# Patient Record
Sex: Female | Born: 1970
Health system: Southern US, Community
[De-identification: ages and names within clinical notes are randomized; demographics above are authoritative.]

## PROBLEM LIST (undated history)

## (undated) DIAGNOSIS — E278 Other specified disorders of adrenal gland: Secondary | ICD-10-CM

## (undated) DIAGNOSIS — F329 Major depressive disorder, single episode, unspecified: Secondary | ICD-10-CM

## (undated) DIAGNOSIS — R519 Headache, unspecified: Secondary | ICD-10-CM

## (undated) DIAGNOSIS — F32A Depression, unspecified: Secondary | ICD-10-CM

## (undated) DIAGNOSIS — Z923 Personal history of irradiation: Secondary | ICD-10-CM

## (undated) DIAGNOSIS — N946 Dysmenorrhea, unspecified: Secondary | ICD-10-CM

## (undated) DIAGNOSIS — C3491 Malignant neoplasm of unspecified part of right bronchus or lung: Secondary | ICD-10-CM

## (undated) DIAGNOSIS — M549 Dorsalgia, unspecified: Secondary | ICD-10-CM

## (undated) DIAGNOSIS — R51 Headache: Secondary | ICD-10-CM

## (undated) DIAGNOSIS — M79605 Pain in left leg: Secondary | ICD-10-CM

## (undated) DIAGNOSIS — I209 Angina pectoris, unspecified: Secondary | ICD-10-CM

## (undated) DIAGNOSIS — Z8489 Family history of other specified conditions: Secondary | ICD-10-CM

## (undated) DIAGNOSIS — Z7189 Other specified counseling: Secondary | ICD-10-CM

## (undated) DIAGNOSIS — C419 Malignant neoplasm of bone and articular cartilage, unspecified: Secondary | ICD-10-CM

## (undated) DIAGNOSIS — M545 Low back pain: Secondary | ICD-10-CM

## (undated) DIAGNOSIS — Z5112 Encounter for antineoplastic immunotherapy: Secondary | ICD-10-CM

## (undated) DIAGNOSIS — IMO0001 Reserved for inherently not codable concepts without codable children: Secondary | ICD-10-CM

## (undated) DIAGNOSIS — F419 Anxiety disorder, unspecified: Secondary | ICD-10-CM

## (undated) DIAGNOSIS — G8929 Other chronic pain: Secondary | ICD-10-CM

## (undated) DIAGNOSIS — R569 Unspecified convulsions: Secondary | ICD-10-CM

## (undated) HISTORY — DX: Low back pain: M54.5

## (undated) HISTORY — DX: Pain in left leg: M79.605

## (undated) HISTORY — DX: Other specified disorders of adrenal gland: E27.8

## (undated) HISTORY — DX: Encounter for antineoplastic immunotherapy: Z51.12

## (undated) HISTORY — DX: Unspecified convulsions: R56.9

## (undated) HISTORY — DX: Malignant neoplasm of unspecified part of right bronchus or lung: C34.91

## (undated) HISTORY — PX: OTHER SURGICAL HISTORY: SHX169

## (undated) HISTORY — DX: Dysmenorrhea, unspecified: N94.6

## (undated) HISTORY — DX: Other specified counseling: Z71.89

---

## 1997-09-02 ENCOUNTER — Other Ambulatory Visit: Admission: RE | Admit: 1997-09-02 | Discharge: 1997-09-02 | Payer: Self-pay | Admitting: Obstetrics

## 1997-11-20 ENCOUNTER — Encounter: Admission: RE | Admit: 1997-11-20 | Discharge: 1997-11-20 | Payer: Self-pay | Admitting: *Deleted

## 1997-11-25 ENCOUNTER — Encounter: Admission: RE | Admit: 1997-11-25 | Discharge: 1997-11-25 | Payer: Self-pay | Admitting: Internal Medicine

## 1998-02-05 ENCOUNTER — Emergency Department (HOSPITAL_COMMUNITY): Admission: EM | Admit: 1998-02-05 | Discharge: 1998-02-05 | Payer: Self-pay | Admitting: Emergency Medicine

## 1998-02-05 ENCOUNTER — Encounter: Payer: Self-pay | Admitting: Emergency Medicine

## 1998-02-10 ENCOUNTER — Emergency Department (HOSPITAL_COMMUNITY): Admission: EM | Admit: 1998-02-10 | Discharge: 1998-02-10 | Payer: Self-pay | Admitting: Emergency Medicine

## 1998-03-12 ENCOUNTER — Encounter: Admission: RE | Admit: 1998-03-12 | Discharge: 1998-03-12 | Payer: Self-pay | Admitting: *Deleted

## 1998-03-12 ENCOUNTER — Encounter: Admission: RE | Admit: 1998-03-12 | Discharge: 1998-06-10 | Payer: Self-pay | Admitting: *Deleted

## 1998-03-22 ENCOUNTER — Encounter: Admission: RE | Admit: 1998-03-22 | Discharge: 1998-03-22 | Payer: Self-pay | Admitting: *Deleted

## 1998-03-22 ENCOUNTER — Encounter: Admission: RE | Admit: 1998-03-22 | Discharge: 1998-06-20 | Payer: Self-pay | Admitting: *Deleted

## 1998-04-05 ENCOUNTER — Encounter: Payer: Self-pay | Admitting: *Deleted

## 1998-05-17 ENCOUNTER — Other Ambulatory Visit: Admission: RE | Admit: 1998-05-17 | Discharge: 1998-05-17 | Payer: Self-pay | Admitting: Obstetrics and Gynecology

## 1998-07-22 ENCOUNTER — Emergency Department (HOSPITAL_COMMUNITY): Admission: EM | Admit: 1998-07-22 | Discharge: 1998-07-22 | Payer: Self-pay | Admitting: Emergency Medicine

## 1998-08-25 ENCOUNTER — Encounter: Admission: RE | Admit: 1998-08-25 | Discharge: 1998-11-01 | Payer: Self-pay | Admitting: *Deleted

## 1999-05-04 ENCOUNTER — Emergency Department (HOSPITAL_COMMUNITY): Admission: EM | Admit: 1999-05-04 | Discharge: 1999-05-04 | Payer: Self-pay | Admitting: Internal Medicine

## 2000-06-26 ENCOUNTER — Emergency Department (HOSPITAL_COMMUNITY): Admission: EM | Admit: 2000-06-26 | Discharge: 2000-06-26 | Payer: Self-pay | Admitting: Emergency Medicine

## 2001-04-22 ENCOUNTER — Emergency Department (HOSPITAL_COMMUNITY): Admission: EM | Admit: 2001-04-22 | Discharge: 2001-04-22 | Payer: Self-pay | Admitting: Emergency Medicine

## 2001-07-26 ENCOUNTER — Other Ambulatory Visit: Admission: RE | Admit: 2001-07-26 | Discharge: 2001-07-26 | Payer: Self-pay | Admitting: Gynecology

## 2001-11-15 ENCOUNTER — Encounter: Payer: Self-pay | Admitting: Emergency Medicine

## 2001-11-15 ENCOUNTER — Emergency Department (HOSPITAL_COMMUNITY): Admission: EM | Admit: 2001-11-15 | Discharge: 2001-11-15 | Payer: Self-pay | Admitting: Emergency Medicine

## 2001-11-18 ENCOUNTER — Emergency Department (HOSPITAL_COMMUNITY): Admission: EM | Admit: 2001-11-18 | Discharge: 2001-11-18 | Payer: Self-pay | Admitting: Emergency Medicine

## 2003-09-07 ENCOUNTER — Encounter: Admission: RE | Admit: 2003-09-07 | Discharge: 2003-09-07 | Payer: Self-pay | Admitting: Internal Medicine

## 2004-01-06 ENCOUNTER — Ambulatory Visit: Payer: Self-pay | Admitting: Internal Medicine

## 2004-01-19 ENCOUNTER — Ambulatory Visit (HOSPITAL_COMMUNITY): Admission: RE | Admit: 2004-01-19 | Discharge: 2004-01-19 | Payer: Self-pay | Admitting: Internal Medicine

## 2004-02-05 ENCOUNTER — Ambulatory Visit (HOSPITAL_COMMUNITY): Admission: RE | Admit: 2004-02-05 | Discharge: 2004-02-05 | Payer: Self-pay | Admitting: Internal Medicine

## 2004-03-03 ENCOUNTER — Ambulatory Visit: Payer: Self-pay | Admitting: Obstetrics and Gynecology

## 2004-03-27 ENCOUNTER — Emergency Department (HOSPITAL_COMMUNITY): Admission: EM | Admit: 2004-03-27 | Discharge: 2004-03-27 | Payer: Self-pay | Admitting: Emergency Medicine

## 2004-06-21 ENCOUNTER — Ambulatory Visit: Payer: Self-pay | Admitting: Obstetrics and Gynecology

## 2004-07-11 ENCOUNTER — Ambulatory Visit: Payer: Self-pay | Admitting: Obstetrics and Gynecology

## 2004-07-11 ENCOUNTER — Ambulatory Visit (HOSPITAL_COMMUNITY): Admission: RE | Admit: 2004-07-11 | Discharge: 2004-07-11 | Payer: Self-pay | Admitting: Obstetrics and Gynecology

## 2004-07-28 ENCOUNTER — Ambulatory Visit: Payer: Self-pay | Admitting: Obstetrics and Gynecology

## 2004-10-13 ENCOUNTER — Ambulatory Visit: Payer: Self-pay | Admitting: *Deleted

## 2004-12-29 ENCOUNTER — Ambulatory Visit: Payer: Self-pay | Admitting: Obstetrics & Gynecology

## 2005-02-23 ENCOUNTER — Ambulatory Visit: Payer: Self-pay | Admitting: *Deleted

## 2005-02-23 ENCOUNTER — Encounter (INDEPENDENT_AMBULATORY_CARE_PROVIDER_SITE_OTHER): Payer: Self-pay | Admitting: *Deleted

## 2005-03-09 ENCOUNTER — Ambulatory Visit: Payer: Self-pay | Admitting: Internal Medicine

## 2005-03-23 ENCOUNTER — Ambulatory Visit: Payer: Self-pay | Admitting: *Deleted

## 2005-04-21 ENCOUNTER — Ambulatory Visit: Payer: Self-pay | Admitting: Family Medicine

## 2005-04-21 ENCOUNTER — Other Ambulatory Visit: Admission: RE | Admit: 2005-04-21 | Discharge: 2005-04-21 | Payer: Self-pay | Admitting: Family Medicine

## 2005-05-02 ENCOUNTER — Ambulatory Visit: Payer: Self-pay | Admitting: Obstetrics & Gynecology

## 2005-06-01 ENCOUNTER — Ambulatory Visit: Payer: Self-pay | Admitting: Family Medicine

## 2005-06-01 ENCOUNTER — Encounter (INDEPENDENT_AMBULATORY_CARE_PROVIDER_SITE_OTHER): Payer: Self-pay | Admitting: *Deleted

## 2005-06-01 ENCOUNTER — Other Ambulatory Visit: Admission: RE | Admit: 2005-06-01 | Discharge: 2005-06-01 | Payer: Self-pay | Admitting: Family Medicine

## 2005-06-15 ENCOUNTER — Ambulatory Visit: Payer: Self-pay | Admitting: Family Medicine

## 2005-08-21 ENCOUNTER — Ambulatory Visit: Payer: Self-pay | Admitting: *Deleted

## 2005-10-06 ENCOUNTER — Ambulatory Visit: Payer: Self-pay | Admitting: Family Medicine

## 2005-10-06 ENCOUNTER — Encounter (INDEPENDENT_AMBULATORY_CARE_PROVIDER_SITE_OTHER): Payer: Self-pay | Admitting: *Deleted

## 2005-10-09 ENCOUNTER — Inpatient Hospital Stay (HOSPITAL_COMMUNITY): Admission: AD | Admit: 2005-10-09 | Discharge: 2005-10-09 | Payer: Self-pay | Admitting: Gynecology

## 2005-11-06 ENCOUNTER — Ambulatory Visit: Payer: Self-pay | Admitting: Family Medicine

## 2006-03-08 ENCOUNTER — Ambulatory Visit: Payer: Self-pay | Admitting: Gynecology

## 2006-03-08 ENCOUNTER — Encounter (INDEPENDENT_AMBULATORY_CARE_PROVIDER_SITE_OTHER): Payer: Self-pay | Admitting: *Deleted

## 2006-04-01 ENCOUNTER — Emergency Department (HOSPITAL_COMMUNITY): Admission: EM | Admit: 2006-04-01 | Discharge: 2006-04-01 | Payer: Self-pay | Admitting: Emergency Medicine

## 2006-07-11 ENCOUNTER — Ambulatory Visit: Payer: Self-pay | Admitting: Family Medicine

## 2006-09-26 ENCOUNTER — Ambulatory Visit: Payer: Self-pay | Admitting: Obstetrics and Gynecology

## 2006-12-12 ENCOUNTER — Ambulatory Visit: Payer: Self-pay | Admitting: Obstetrics and Gynecology

## 2007-01-24 ENCOUNTER — Emergency Department (HOSPITAL_COMMUNITY): Admission: EM | Admit: 2007-01-24 | Discharge: 2007-01-24 | Payer: Self-pay | Admitting: Emergency Medicine

## 2007-01-30 ENCOUNTER — Emergency Department (HOSPITAL_COMMUNITY): Admission: EM | Admit: 2007-01-30 | Discharge: 2007-01-30 | Payer: Self-pay | Admitting: Emergency Medicine

## 2007-03-13 ENCOUNTER — Encounter: Payer: Self-pay | Admitting: Obstetrics and Gynecology

## 2007-03-13 ENCOUNTER — Ambulatory Visit: Payer: Self-pay | Admitting: Obstetrics & Gynecology

## 2007-03-15 ENCOUNTER — Encounter: Admission: RE | Admit: 2007-03-15 | Discharge: 2007-03-15 | Payer: Self-pay | Admitting: Obstetrics & Gynecology

## 2007-05-30 ENCOUNTER — Ambulatory Visit: Payer: Self-pay | Admitting: Obstetrics and Gynecology

## 2007-08-15 ENCOUNTER — Ambulatory Visit: Payer: Self-pay | Admitting: Obstetrics and Gynecology

## 2007-11-13 ENCOUNTER — Ambulatory Visit: Payer: Self-pay | Admitting: Obstetrics and Gynecology

## 2008-02-19 ENCOUNTER — Ambulatory Visit: Payer: Self-pay | Admitting: Family Medicine

## 2008-05-06 ENCOUNTER — Encounter: Payer: Self-pay | Admitting: Obstetrics & Gynecology

## 2008-05-06 ENCOUNTER — Ambulatory Visit: Payer: Self-pay | Admitting: Obstetrics & Gynecology

## 2008-06-04 ENCOUNTER — Emergency Department (HOSPITAL_COMMUNITY): Admission: EM | Admit: 2008-06-04 | Discharge: 2008-06-04 | Payer: Self-pay | Admitting: Emergency Medicine

## 2008-07-22 ENCOUNTER — Ambulatory Visit: Payer: Self-pay | Admitting: Obstetrics & Gynecology

## 2008-10-08 ENCOUNTER — Ambulatory Visit: Payer: Self-pay | Admitting: Obstetrics & Gynecology

## 2008-10-22 ENCOUNTER — Ambulatory Visit: Payer: Self-pay | Admitting: Obstetrics and Gynecology

## 2009-01-15 ENCOUNTER — Ambulatory Visit: Payer: Self-pay | Admitting: Obstetrics & Gynecology

## 2009-04-07 ENCOUNTER — Ambulatory Visit: Payer: Self-pay | Admitting: Obstetrics & Gynecology

## 2009-04-21 ENCOUNTER — Emergency Department (HOSPITAL_COMMUNITY): Admission: EM | Admit: 2009-04-21 | Discharge: 2009-04-21 | Payer: Self-pay | Admitting: Emergency Medicine

## 2009-06-24 ENCOUNTER — Ambulatory Visit: Payer: Self-pay | Admitting: Obstetrics and Gynecology

## 2009-09-15 ENCOUNTER — Encounter: Payer: Self-pay | Admitting: Internal Medicine

## 2009-09-15 ENCOUNTER — Ambulatory Visit: Payer: Self-pay | Admitting: Obstetrics and Gynecology

## 2009-09-16 ENCOUNTER — Encounter: Payer: Self-pay | Admitting: Obstetrics and Gynecology

## 2009-09-16 LAB — CONVERTED CEMR LAB: Yeast Wet Prep HPF POC: NONE SEEN

## 2009-11-11 ENCOUNTER — Emergency Department (HOSPITAL_COMMUNITY): Admission: EM | Admit: 2009-11-11 | Discharge: 2009-11-11 | Payer: Self-pay | Admitting: Family Medicine

## 2009-12-02 ENCOUNTER — Encounter: Payer: Self-pay | Admitting: Internal Medicine

## 2009-12-02 ENCOUNTER — Ambulatory Visit: Payer: Self-pay | Admitting: Internal Medicine

## 2009-12-02 DIAGNOSIS — IMO0002 Reserved for concepts with insufficient information to code with codable children: Secondary | ICD-10-CM

## 2009-12-02 LAB — CONVERTED CEMR LAB: Beta hcg, urine, semiquantitative: NEGATIVE

## 2009-12-06 ENCOUNTER — Ambulatory Visit: Payer: Self-pay | Admitting: Internal Medicine

## 2009-12-06 DIAGNOSIS — N946 Dysmenorrhea, unspecified: Secondary | ICD-10-CM | POA: Insufficient documentation

## 2009-12-06 HISTORY — DX: Dysmenorrhea, unspecified: N94.6

## 2010-02-28 ENCOUNTER — Ambulatory Visit: Payer: Self-pay | Admitting: Internal Medicine

## 2010-03-01 ENCOUNTER — Ambulatory Visit: Payer: Self-pay | Admitting: Internal Medicine

## 2010-03-01 ENCOUNTER — Encounter: Payer: Self-pay | Admitting: Internal Medicine

## 2010-03-01 DIAGNOSIS — Z87891 Personal history of nicotine dependence: Secondary | ICD-10-CM

## 2010-03-01 DIAGNOSIS — R3919 Other difficulties with micturition: Secondary | ICD-10-CM | POA: Insufficient documentation

## 2010-03-01 LAB — CONVERTED CEMR LAB
GC Probe Amp, Urine: NEGATIVE
Glucose, Urine, Semiquant: NEGATIVE
Nitrite: NEGATIVE
Specific Gravity, Urine: 1.025

## 2010-03-03 ENCOUNTER — Telehealth: Payer: Self-pay | Admitting: Licensed Clinical Social Worker

## 2010-03-10 ENCOUNTER — Encounter: Payer: Self-pay | Admitting: Internal Medicine

## 2010-03-10 LAB — CONVERTED CEMR LAB
Bacteria, UA: NONE SEEN
Bilirubin Urine: NEGATIVE
CO2: 24 meq/L (ref 19–32)
Chloride: 110 meq/L (ref 96–112)
Creatinine, Ser: 1 mg/dL (ref 0.40–1.20)
Eosinophils Absolute: 0.2 10*3/uL (ref 0.0–0.7)
Eosinophils Relative: 2 % (ref 0–5)
Glucose, Bld: 90 mg/dL (ref 70–99)
Hemoglobin: 14.7 g/dL (ref 12.0–15.0)
Ketones, ur: NEGATIVE mg/dL
Leukocytes, UA: NEGATIVE
Lymphs Abs: 3.6 10*3/uL (ref 0.7–4.0)
MCV: 99.3 fL (ref >=78.0)
Monocytes Absolute: 0.5 10*3/uL (ref 0.1–1.0)
Monocytes Relative: 7 % (ref 3–12)
Neutro Abs: 2.4 10*3/uL (ref 1.7–7.7)
Neutrophils Relative %: 36 % — ABNORMAL LOW (ref 43–77)
Nitrite: NEGATIVE
Protein, ur: NEGATIVE mg/dL
Sodium: 142 meq/L (ref 135–145)
Total Protein: 7.3 g/dL (ref 6.0–8.3)
Urobilinogen, UA: 0.2 (ref 0.0–1.0)

## 2010-03-25 ENCOUNTER — Telehealth: Payer: Self-pay | Admitting: Licensed Clinical Social Worker

## 2010-03-28 ENCOUNTER — Ambulatory Visit: Payer: Self-pay | Admitting: Internal Medicine

## 2010-03-28 DIAGNOSIS — B373 Candidiasis of vulva and vagina: Secondary | ICD-10-CM

## 2010-03-30 ENCOUNTER — Telehealth: Payer: Self-pay | Admitting: Licensed Clinical Social Worker

## 2010-04-01 ENCOUNTER — Encounter: Payer: Self-pay | Admitting: Licensed Clinical Social Worker

## 2010-05-14 ENCOUNTER — Encounter: Payer: Self-pay | Admitting: Internal Medicine

## 2010-05-23 ENCOUNTER — Ambulatory Visit: Admit: 2010-05-23 | Payer: Self-pay

## 2010-05-24 NOTE — Consult Note (Signed)
Summary: GYN VISIT NOTE  GYN VISIT NOTE   Imported By: Margie Billet 12/22/2009 11:41:40  _____________________________________________________________________  External Attachment:    Type:   Image     Comment:   External Document

## 2010-05-24 NOTE — Assessment & Plan Note (Signed)
Summary: FU VISIT/?URINE INJECTION/DS   Vital Signs:  Patient profile:   40 year old female Height:      67 inches (170.18 cm) Weight:      150.6 pounds (68.45 kg) BMI:     23.67 Temp:     98.6 degrees F (37.00 degrees C) oral Pulse rate:   85 / minute BP sitting:   128 / 81  (left arm) Cuff size:   regular  Vitals Entered By: Cynda Familia Duncan Dull) (March 01, 2010 2:47 PM) CC: wants "kidneys checked" c/o urine odor Is Patient Diabetic? No Nutritional Status BMI of 19 -24 = normal  Have you ever been in a relationship where you felt threatened, hurt or afraid?No   Does patient need assistance? Functional Status Self care Ambulation Normal   Primary Care Provider:  Rosana Berger MD  CC:  wants "kidneys checked" c/o urine odor.  History of Present Illness: 40 yo woman presents for a kidney check up.  She does not like to drink water, she only drinks tea.  She drank a liter of sprite, smelled like "medicine" orange in color, 3-4 mcdonald tea drink per day in the past 4 months.  No burning sensation, no dysuria, frequency 3-6 x per day.  She reports that she would get back pain whenever she drinks lots of soda and this has happened before in the past.  She has never seen a doctor for this problem before.  She is taking Daytime sinus for her sinus infection. She expresses interest in quitting smoking cigarette and would like some help.   Preventive Screening-Counseling & Management  Alcohol-Tobacco     Smoking Status: current     Packs/Day: 1 ppd  Review of Systems       The patient complains of weight gain.         gaining over 20 lbs because she wants to by eating a lot  Physical Exam  General:  alert, well-developed, well-nourished, and well-hydrated.   Lungs:  normal respiratory effort, no intercostal retractions, no accessory muscle use, and normal breath sounds.   Heart:  normal rate, regular rhythm, no murmur, and no gallop.   Abdomen:  soft, non-tender,  normal bowel sounds, and no distention.  Neg CVA tenderness Neurologic:  alert & oriented X3, cranial nerves II-XII intact, and strength normal in all extremities.     Impression & Recommendations:  Problem # 1:  DARK URINE (ICD-788.69) Assessment New Questionable hematuria, patient had small blood in office urinalysis, with some ketone and small bilirubin. We will check to see if she has RBC cast in her U/A or myoglobin.  Will defer treatment for now until all labs/urinalysis are back.  Patient also reports being anemic in the past, we will check her CBC as well.   Orders: T-Urinalysis ([id number]) T-GC Probe, urine [phone]) T-Culture, Urine ([id number]) T-Comprehensive Metabolic Panel ([id number]) T-CBC w/Diff ([id number])  Problem # 2:  TOBACCO USE, QUIT (ICD-V15.82) Assessment: New Patient has been smoking in the past 21 years and would like to quit smoking.  She has tried patch and gum before without success and stated that the patch was too expensive. We set a QUIT date in the office: 03/15/2010.  I explained to her in details about the side effects of Wellbutrin and she undertands that she needs to stop taking the med and follow up with me if she feels depressed or have thoughts about hurting herself.   Prescribed Wellbutrin 150mg  by mouth once daily x 3  days, then 150mg  by mouth two times a day . Patient is supposed to follow up with me in 2-3 weeks  Social work refer for smoking cessation counseling Orders: Social Work Referral (Social )  Problem # 3:  PREVENTIVE HEALTH CARE (ICD-V70.0) Assessment: New Will do pap smear in May 2012, she has had abnormal pap in the past and had leep incision.  Ever since her leep, her pap smear has been normal. Mammogram was about 1 year ago with normal report per patient, she has a positive family history of breast and cervical center.  Complete Medication List: 1)  Wellbutrin Sr 150 Mg Xr12h-tab (Bupropion hcl) .[name].. 1 tablet by mouth  once daily x 3 days, then take 1 tab by mouth two times a day  Patient Instructions: 1)  Start taking Wellbutrin (Bupropion) 150mg  once daily x 3 days, then 150mg  two times a day  2)  Please set your quit date within 2 weeks of starting Wellbutrin.  On the QUIT date, you need to throw away your ash-tray, cigarettes, or anything that associated with smoking. 3)  If you start feeling depressed or thoughts about hurting yourself, please stop your medication and see me in clinic. 4)  Please follow-up in 2-3 weeks with Dr. Anselm Jungling 5)  I will call you with lab results 6)  Our social worker Dorothe Pea will call you with smoking cessation counselling/behaviors Prescriptions: WELLBUTRIN SR 150 MG XR12H-TAB (BUPROPION HCL) 1 tablet by mouth once daily x 3 days, then take 1 tab by mouth two times a day  #60 x 1   Entered and Authorized by:   Rosana Berger MD   Signed by:   Rosana Berger MD on 03/01/2010   Method used:   Historical   RxID:   [id number]    Orders Added: 1)  T-Urinalysis [81003-65000] 2)  T-GC Probe, urine [phone] 3)  T-Culture, Urine [[id number]] 4)  T-Comprehensive Metabolic Panel [[id number]] 5)  T-CBC w/Diff [[id number]] 6)  Est. Patient Level III [44010] 7)  Social Work Referral [facility] ]   Process Orders Check Orders Results:     [facility]: ABN not required for this insurance Tests Sent for requisitioning (March 01, 2010 4:36 PM):     03/01/2010: [facility] -- T-Urinalysis [81003-65000] (signed)     03/01/2010: [facility] -- T-GC Probe, urine [id number] (signed)     03/01/2010: [facility] -- T-Culture, Urine [[id number]] (signed)     03/01/2010: [facility] -- T-Comprehensive Metabolic Panel [[id number]] (signed)     03/01/2010: [facility] -- [facility] w/Diff [[id number]] (signed)    Laboratory Results   Urine Tests  Date/Time Received: .Cynda Familia [facility])  March 01, 2010 2:56 PM  Date/Time Reported: .Cynda Familia [facility]  March 01, 2010 2:57 PM   Routine Urinalysis   Color: yellow Appearance: Clear Glucose: negative   (Normal Range: Negative) Bilirubin: small   (Normal Range: Negative) Ketone: trace (5)   (Normal Range: Negative) Spec. Gravity: 1.025   (Normal Range: 1.003-1.035) Blood: small   (Normal Range: Negative) pH: 5.0   (Normal Range: 5.0-8.0) Protein: negative   (Normal Range: Negative) Urobilinogen: 0.2   (Normal Range: 0-1) Nitrite: negative   (Normal Range: Negative) Leukocyte Esterace: negative   (Normal Range: Negative)        Prevention & Chronic Care Immunizations   Influenza vaccine: Not documented    Tetanus booster: Not documented    Pneumococcal vaccine: Not documented  Other  Screening   Pap smear: NEGATIVE FOR INTRAEPITHELIAL LESIONS OR MALIGNANCY.  (05/06/2008)   Smoking status: current  (03/01/2010)  Lipids   Total Cholesterol: Not documented   LDL: Not documented   LDL Direct: Not documented   HDL: Not documented   Triglycerides: Not documented  Appended Document: Orders Update    Clinical Lists Changes  Orders: Added new Test order of T-Chlamydia  Probe, urine [id number]) - Signed Added new Test order of T-Urinalysis Dipstick only (09811BJ) - Signed      Process Orders Check Orders Results:     [facility]: ABN not required for this insurance Order queued for requisitioning for [facility]: March 01, 2010 4:41 PM Tests Sent for requisitioning (March 02, 2010 10:10 AM):     03/01/2010: [facility] -- [employer], urine [phone] (signed)

## 2010-05-24 NOTE — Miscellaneous (Signed)
Summary: PATIENT CONSENT FORM  PATIENT CONSENT FORM   Imported By: Louretta Parma 12/02/2009 15:50:45  _____________________________________________________________________  External Attachment:    Type:   Image     Comment:   External Document

## 2010-05-24 NOTE — Assessment & Plan Note (Signed)
Summary: FU VISIT/DS   Vital Signs:  Patient profile:   40 year old female Height:      67 inches (170.18 cm) Weight:      146.4 pounds (66.55 kg) BMI:     23.01 Temp:     97.4 degrees F (36.33 degrees C) oral Pulse rate:   78 / minute BP sitting:   104 / 68  (left arm)  Vitals Entered By: Stanton Kidney Ditzler RN (March 28, 2010 3:18 PM) Is Patient Diabetic? No Pain Assessment Patient in pain? no      Nutritional Status BMI of < 19 = underweight Nutritional Status Detail appetite down  Have you ever been in a relationship where you felt threatened, hurt or afraid?denies   Does patient need assistance? Functional Status Self care Ambulation Normal Comments Discuss stop smoking.   Primary Care Provider:  Rosana Berger MD   History of Present Illness: 40 yo woman presents for follow up on smoking cessation and vaginal itch.  She was supposed to start Wellbutrin last month, however, [facility] [place] [facility] Wellbutrin and told patient to go back to PCP to get prescription for Chantix or Nicotral inhaler.  She has not started any medication; however, she is down to smoking 4 cigarettes per day and is planning to quit by the week of December 19th.  She is also complaining of a vaginal itch without any foul smelling or discharge after the completion of her abx course- Amoxicillin for UTI.    Depression History:      The patient denies a depressed mood most of the day and a diminished interest in her usual daily activities.         Preventive Screening-Counseling & Management  Alcohol-Tobacco     Smoking Status: current     Packs/Day: 1-2  ppd  Social History: Packs/Day:  1-2  ppd  Physical Exam  General:  alert, well-developed, well-nourished, and well-hydrated.   Lungs:  normal respiratory effort, no intercostal retractions, no accessory muscle use, normal breath sounds, no crackles, and no wheezes.   Heart:  normal rate, regular rhythm, no murmur, and no gallop.     Abdomen:  soft, non-tender, normal bowel sounds, no distention, no masses, no guarding, no rigidity, and no rebound tenderness.   Msk:  normal ROM, no joint tenderness, and no joint swelling.   Pulses:  R and L carotid,radial,femoral,dorsalis pedis and posterior tibial pulses are full and equal bilaterally Extremities:  No clubbing, cyanosis, edema, or deformity noted with normal full range of motion of all joints.   Neurologic:  No cranial nerve deficits noted. Station and gait are normal. Plantar reflexes are down-going bilaterally. DTRs are symmetrical throughout. Sensory, motor and coordinative functions appear intact.   Impression & Recommendations:  Problem # 1:  TOBACCO USE, QUIT (ICD-V15.82) She is down to 4 cigarettes per day since last office visit.  She states that she has not been able to speak to Dorothe Pea in regards to smoking cessation counselling.  She is planning to quit smoking by the week of Dec 19th because that is when her final exams for school will be over.  She reports smoking a lot when she is doing homework or studying for exam.   Will prescribe Chantix today because [facility] does not carry Wellbutrin anymore.   I explained to her that she needs to start Chantix 1 week prior to stop date and that if she experiences suicidal ideation or depression,  she needs to stop the med and come in to be evaluated.   I will ask Dorothe Pea to call patient again.    Problem # 2:  CANDIDIASIS, VAGINAL (ICD-112.1) Complains of itching in her vaginal area after taking Amoxicillin for her UTI.  Most likely yeast infection.  Will prescribe Fluconazole 150mg  and she was told to go to [employer] or [facility] ($4 plan). Her updated medication list for this problem includes:    Fluconazole 150 Mg Tabs (Fluconazole) .[name]... Take 1 tablet by mouth for yeast infection  Complete Medication List: 1)  Chantix Starting Month Pak 0.5 Mg X 11 & 1 Mg X 42  Tabs (Varenicline tartrate) .... Use as directed 2)  Fluconazole 150 Mg Tabs (Fluconazole) .... Take 1 tablet by mouth for yeast infection  Patient Instructions: 1)  Please follow up with Dr. Anselm Jungling in 2 weeks after you start your Chantix.  You need to start Chantix 1 week prior to your quit date 2)  Take Diflucan for yeast infection Prescriptions: CHANTIX STARTING MONTH PAK 0.5 MG X 11 & 1 MG X 42 TABS (VARENICLINE TARTRATE) use as directed  #1 x 0   Entered and Authorized by:   Rosana Berger MD   Signed by:   Rosana Berger MD on 03/28/2010   Method used:   Print then Give to Patient   RxID:   [id number] FLUCONAZOLE 150 MG TABS (FLUCONAZOLE) take 1 tablet by mouth for yeast infection  #1 x 0   Entered and Authorized by:   Rosana Berger MD   Signed by:   Rosana Berger MD on 03/28/2010   Method used:   Print then Give to Patient   RxID:   [id number]    Orders Added: 1)  Est. Patient Level II [39767]     Prevention & Chronic Care Immunizations   Influenza vaccine: Not documented    Tetanus booster: Not documented    Pneumococcal vaccine: Not documented  Other Screening   Pap smear: NEGATIVE FOR INTRAEPITHELIAL LESIONS OR MALIGNANCY.  (05/06/2008)   Smoking status: current  (03/28/2010)  Lipids   Total Cholesterol: Not documented   LDL: Not documented   LDL Direct: Not documented   HDL: Not documented   Triglycerides: Not documented      Resource handout printed.  Appended Document: FU VISIT/DS I discussed the patient with Dr. Anselm Jungling and I agree with the assessment and plan as outlined above.

## 2010-05-24 NOTE — Assessment & Plan Note (Signed)
Summary: DEPOSHOT/;CH  Nurse Visit   Medication Administration  Injection # 1:    Medication: Depo-Provera 150mg     Diagnosis: DYSMENORRHEA (ICD-625.3)    Route: IM    Site: RUOQ gluteus    Exp Date: 08/2011    Lot #: [id number]    Mfr: GreenStone    Patient tolerated injection without complications    Given by: Angelina Ok RN (December 06, 2009 3:28 PM)  Orders Added: 1)  Depo-Provera 150mg  [J1055] 2)  Admin of Therapeutic Inj  intramuscular or subcutaneous [96372]   Medication Administration  Injection # 1:    Medication: Depo-Provera 150mg     Diagnosis: DYSMENORRHEA (ICD-625.3)    Route: IM    Site: RUOQ gluteus    Exp Date: 08/2011    Lot #: [id number]    Mfr: GreenStone    Patient tolerated injection without complications    Given by: Angelina Ok RN (December 06, 2009 3:28 PM)  Orders Added: 1)  Depo-Provera 150mg  [J1055] 2)  Admin of Therapeutic Inj  intramuscular or subcutaneous [13244]

## 2010-05-24 NOTE — Assessment & Plan Note (Signed)
Summary: DEPO INJECTION/DS  Nurse Visit   Medication Administration  Injection # 1:    Medication: Depo-Provera 150mg     Diagnosis: DYSMENORRHEA (ICD-625.3)    Route: IM    Site: RUOQ gluteus    Exp Date: 05/2012    Lot #: [id number]    Mfr: Illene Silver    Patient tolerated injection without complications    Given by: Angelina Ok RN (February 28, 2010 12:00 PM)  Orders Added: 1)  Admin of Therapeutic Inj  intramuscular or subcutaneous [96372] 2)  Depo-Provera 150mg  [J1055]   Medication Administration  Injection # 1:    Medication: Depo-Provera 150mg     Diagnosis: DYSMENORRHEA (ICD-625.3)    Route: IM    Site: RUOQ gluteus    Exp Date: 05/2012    Lot #: [id number]    Mfr: Illene Silver    Patient tolerated injection without complications    Given by: Angelina Ok RN (February 28, 2010 12:00 PM)  Orders Added: 1)  Admin of Therapeutic Inj  intramuscular or subcutaneous [96372] 2)  Depo-Provera 150mg  [J1055]

## 2010-05-24 NOTE — Miscellaneous (Signed)
  Clinical Lists Changes  Medications: Added new medication of AMOXICILLIN 500 MG CAPS (AMOXICILLIN) Take 1 tablet by mouth three times a day  x 7 days - Signed Rx of AMOXICILLIN 500 MG CAPS (AMOXICILLIN) Take 1 tablet by mouth three times a day  x 7 days;  #31 x 0;  Signed;  Entered by: Rosana Berger MD;  Authorized by: Rosana Berger MD;  Method used: Telephoned to    Prescriptions: AMOXICILLIN 500 MG CAPS (AMOXICILLIN) Take 1 tablet by mouth three times a day  x 7 days  #31 x 0   Entered and Authorized by:   Rosana Berger MD   Signed by:   Rosana Berger MD on 03/10/2010   Method used:   Telephoned to ...         RxID:   [id number]

## 2010-05-24 NOTE — Assessment & Plan Note (Signed)
Summary: RA/FU VISIT/DS   Vital Signs:  Patient profile:   40 year old Andrea [name] Height:      67 inches (170.18 cm) Weight:      146.6 pounds (66.64 kg) BMI:     23.04 Temp:     97.6 degrees F (63.67 degrees C) oral Pulse rate:   66 / minute BP sitting:   109 / 71  (right arm)  Vitals Entered By: Stanton Kidney Ditzler RN (December 02, 2009 4:06 PM) Is Patient Diabetic? No Pain Assessment Patient in pain? no      Nutritional Status BMI of 19 -24 = normal Nutritional Status Detail appetite down  Have you ever been in a relationship where you felt threatened, hurt or afraid?denies   Does patient need assistance? Functional Status Self care Ambulation Normal Comments Old-new pt to [facility]. Ck-up and needs DepoProvera, ck knot under right arm - went to Urgent Care. End of July pulst over 117 x 54 days. Under inc stress.   Primary Care Provider:  Rosana Berger MD   History of Present Illness: 40 yo Andrea [name] without a significant PMH presents today for depo shot.  Patient has been going to [facility] for her Depo shot; however, she cannot go there anymore because her orange card expired.  She reports no sexual intercourse in the past 3 years and she was placed on Depo because she had dysmenorrhea with severe nausea and vomitting.  Depo works well for her and she is currently Chief of Staff.   She also had an abscess 5x6cm on right axilla 3 weeks ago and it was drained at the urgent care.  She was given a 7 day course of antibiotic and the abcess resolved.  She has no other complaints.    SH: hx of tubal pregnancy so Dr. Okey [name] went in and said her tubes were "mangoed", completely blocked  FH: Mother -DM,  Aunts-cervical and breast cancer Uncle- lung cancer at 48s  Social: smoke 1ppd x 19 yrs, no alcohol, no illicit drug, going to school business administration, single      Depression History:      The patient denies a depressed mood most of the day and a diminished interest in her usual daily  activities.         Preventive Screening-Counseling & Management  Alcohol-Tobacco     Smoking Status: current     Packs/Day: 1 ppd  Social History: Smoking Status:  current Packs/Day:  1 ppd  Review of Systems       decrease in appetite, lost 4 lbs in the past month  Physical Exam  General:  alert and well-developed.   Breasts:  no masses, erythema, or discharge.  Healed abscess Right axilla 1x1cm hyperpigmented.  no erythema, no drainage/pus Lungs:  normal respiratory effort, no intercostal retractions, no accessory muscle use, normal breath sounds, no crackles, and no wheezes.   Heart:  normal rate, regular rhythm, no murmur, no gallop, and no rub.   Abdomen:  soft, non-tender, normal bowel sounds, no distention, and no masses.     Impression & Recommendations:  Problem # 1:  PREVENTIVE HEALTH CARE (ICD-V70.0) Assessment New Per protocol, patient needs 2 negative urine pregnancy tests if we do not know the exact date of the last Depo injection.   I do not have any records of her last Depo-shot; therefore we will do a urine pregnancy test today and she has to return on Monday for the second urine pregnancy test.  If both are negative,  she can get her Depo-Provera shot.   Her last Pap smear was in May, 2011 per patient. Again, I do not find any records of her recent Pap smear.  Will request for MR. Return in 3 months for next Depo shot.   Orders: T-Urine Pregnancy (in -house) [phone])  Problem # 2:  ABSCESS, AXILLA, RIGHT (ICD-682.3) Assessment: New Resolved.  Advised patient to avoid using baking powder as she reported that it usually gives her axilla abscesses in the past.  Patient Instructions: 1)  1. Return to clinic in 3 months for your next Depo Shot 2)  2. Avoid using baking powder to prevent future abscess formation   Prevention & Chronic Care Immunizations   Influenza vaccine: Not documented    Tetanus booster: Not documented    Pneumococcal vaccine: Not  documented  Other Screening   Pap smear: NEGATIVE FOR INTRAEPITHELIAL LESIONS OR MALIGNANCY.  (05/06/2008)   Smoking status: current  (12/02/2009)  Lipids   Total Cholesterol: Not documented   LDL: Not documented   LDL Direct: Not documented   HDL: Not documented   Triglycerides: Not documented      Resource handout printed.   Nursing Instructions: Give Depo  Laboratory Results   Urine Tests  Date/Time Received: December 02, 2009 5:09 PM  Date/Time Reported: Burke Keels  December 02, 2009 5:09 PM     Urine HCG: negative

## 2010-05-24 NOTE — Progress Notes (Signed)
Summary: Smoking Cessation  Phone Note Outgoing Call   Summary of Call: Tel with patient.  She is in class.  Will try again.   Follow-up for Phone Call        Left message for patient to call me today.  Dorothe Pea  March 07, 2010 12:50 PM      Appended Document: Smoking Cessation Patient will see me on Friday  Nov 18th at 10 AM  Appended Document: Smoking Cessation Unfortunately, the patient came at 10:50 for her smoking cessation appmt and I was unable to see her due to other appointments.  She is welcome to call me to reschedule.

## 2010-05-24 NOTE — Progress Notes (Signed)
Summary: Soc. Work  Programme researcher, broadcasting/film/video of Call: Crystal from [facility] called and said that they can no longer get Welbutrin through the drug programs.   Crystal is asking about an alternative--Chantix-- and the plan is for the patient to discuss with Dr. Anselm Jungling at her Monday appmt and for Dr. Anselm Jungling to consider writing RX for Chantix at that time since Welbutrin not available under pharmaceutical program.

## 2010-05-25 ENCOUNTER — Ambulatory Visit (INDEPENDENT_AMBULATORY_CARE_PROVIDER_SITE_OTHER): Payer: Self-pay | Admitting: *Deleted

## 2010-05-25 DIAGNOSIS — Z309 Encounter for contraceptive management, unspecified: Secondary | ICD-10-CM

## 2010-05-25 MED ORDER — MEDROXYPROGESTERONE ACETATE 150 MG/ML IM SUSP: 150.0000 mg | INTRAMUSCULAR | Status: DC

## 2010-05-25 MED ORDER — MEDROXYPROGESTERONE ACETATE 150 MG/ML IM SUSP
150.0000 mg | INTRAMUSCULAR | Status: DC
  Administered 2010-05-25 – 2011-10-11 (×6): 150 mg via INTRAMUSCULAR

## 2010-05-26 NOTE — Assessment & Plan Note (Signed)
Summary: Smoking Cessation  Smoking Cessation Counseling.  60 minutes.   Very pleasant motivated female patient currently in school at [employer].   The patient would seriously like to quit smoking while she is on recess from [employer] which starts Dec. 17th.  She was unable to obtain Welbutrin thru the Health Dept so they are in the process of trying to get her Chantix.   The patient has been smoking for over 20 years/1 pack a day.   She lives alone but unfortunately has a lot of friends who smoke in her home which I've pointed out to her will be a barrier to quitting.   We talked about making her home and car smoke free while she is waiting on the Chantix and smoking outside to make the habit less convenient and to reduce the # of cigarettes. She thinks she can accomplish this and that her friends will support her in endeavor to quit.   Handouts given including tip sheet, Chantix booklet, quitline, and also information on [place] group classes that begin in January.  Patient receptive to using various resources and she knows that medication and support programs double rate of success.  She will seriously think about arranging her school schedule to attend the group classes at [facility].    SW follow-up.

## 2010-05-26 NOTE — Progress Notes (Signed)
  Phone Note Outgoing Call   Summary of Call: Rescheduled appmt to Friday at 10 AM.

## 2010-07-11 LAB — POCT URINALYSIS DIP (DEVICE)
Bilirubin Urine: NEGATIVE
Ketones, ur: NEGATIVE mg/dL
Urobilinogen, UA: 0.2 mg/dL (ref 0.0–1.0)

## 2010-07-18 ENCOUNTER — Inpatient Hospital Stay (INDEPENDENT_AMBULATORY_CARE_PROVIDER_SITE_OTHER)
Admission: RE | Admit: 2010-07-18 | Discharge: 2010-07-18 | Disposition: A | Discharge status: Home / Self Care | Payer: Self-pay | Source: Ambulatory Visit | Attending: Family Medicine | Admitting: Family Medicine

## 2010-07-18 DIAGNOSIS — M76899 Other specified enthesopathies of unspecified lower limb, excluding foot: Secondary | ICD-10-CM

## 2010-07-29 ENCOUNTER — Inpatient Hospital Stay (INDEPENDENT_AMBULATORY_CARE_PROVIDER_SITE_OTHER)
Admission: RE | Admit: 2010-07-29 | Discharge: 2010-07-29 | Disposition: A | Discharge status: Home / Self Care | Payer: Self-pay | Source: Ambulatory Visit | Attending: Family Medicine | Admitting: Family Medicine

## 2010-07-29 DIAGNOSIS — M76899 Other specified enthesopathies of unspecified lower limb, excluding foot: Secondary | ICD-10-CM

## 2010-07-29 LAB — POCT PREGNANCY, URINE: Preg Test, Ur: NEGATIVE

## 2010-08-23 ENCOUNTER — Ambulatory Visit (INDEPENDENT_AMBULATORY_CARE_PROVIDER_SITE_OTHER): Payer: Self-pay | Admitting: *Deleted

## 2010-08-23 ENCOUNTER — Other Ambulatory Visit: Payer: Self-pay | Admitting: Internal Medicine

## 2010-08-23 DIAGNOSIS — Z309 Encounter for contraceptive management, unspecified: Secondary | ICD-10-CM

## 2010-08-23 DIAGNOSIS — N946 Dysmenorrhea, unspecified: Secondary | ICD-10-CM

## 2010-08-23 MED ORDER — MEDROXYPROGESTERONE ACETATE 150 MG/ML IM SUSP: 150.0000 mg | INTRAMUSCULAR | Status: DC

## 2010-09-06 NOTE — Group Therapy Note (Signed)
NAME:  Andrea [name], Andrea [name] NO.:  [id number]   MEDICAL RECORD NO.:  [id number]          PATIENT TYPE:  WOC   LOCATION:  [facility]                   FACILITY:  [facility]   PHYSICIAN:  Deirdre Christy Gentles, CNM       DATE OF BIRTH:  09/12/1970   DATE OF SERVICE:                                  CLINIC NOTE   REASON FOR VISIT:  Annual exam.  Depo Provera shot.   HISTORY:  This is a 40 year old G5-P-0-0-4-0 with ectopic x4 who is  happy with using Depo Provera, which she has been on since 2007.  She  states she has no regular periods, and is glad to be relieved from her  chronic dysmenorrhea.  She has occasional spotting, which last happened  on January 08, 2007.  She has no irritative vaginal discharge.  She  has had no HSV lesion, and was unaware that her right buttock lesion in  2007 cultured positive for HSV.  She had a different partner after that,  and in fact, has not been sexually active since October of 2007.  However, she does want to be tested today for STIs.  Of note, she states  that 1 month ago she went to [facility], and was diagnosed with  sciatica and a kidney infection, which was treated with antibiotics.  She said she frequently got UTIs in the past when she does not drink  enough fluids.  Her last Pap was 1 year ago, and was normal.  In 2006,  she did have a diagnostic laparoscopic tubal insufflation, and was told  that tubes were very scarred, and that she would probably need assisted  reproductive technology if she desires pregnancy in the future.  She  states that she has had bilateral clear nipple discharge for many years  predating Depo Provera, and she states that she has never had bloody  discharge.  She denies headaches.  She is a smoker, 1/2 pack per day for  several years, and has not been able to quit, though she has tried, and  is motivated at this time.   ALLERGIES:  NONE.   MEDICATIONS:  Tylenol and cough syrup for URI.  Also, Depo Provera.   PAST MEDICAL HISTORY:  Significant as above for UTIs and nipple  discharge.   SURGERIES:  LEEP.   FAMILY HISTORY:  Mother, hypertension and diabetes.  Grandmother,  diabetes.  Father, hypertension.  She also has aunts and brother with  hypertension and diabetes.   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  Positive for tobacco.  Negative for illicit drug use.  She  occasionally drinks alcohol, not to excess.   REVIEW OF SYSTEMS:  Significant for aching, which she attributes to a  cold, and dry cough with upper respiratory congestion.   EXAMINATION:  Blood pressure 95/65.  Weight 133.  Height 5 feet 7  inches.  GENERAL:  A pleasant African American female in no acute distress.  HEENT:  Negative.  THYROID:  Not enlarged.  LUNGS:  Clear to auscultation bilaterally.  HEART:  Regular rate  and rhythm without murmur.  BREASTS:  No discrete masses.  No adenopathy.  There is scanty clear  discharge expressible from both nipples.  ABDOMEN:  Soft and nontender.  No organomegaly.  PELVIC:  NAFG.  Good tone and support.  BUS negative.  CERVIX:  Nulliparous.  Slightly everted.  Pap, GC, and chlamydia done.  UTERUS:  Anteverted, anteflexed, NSSP.  ADNEXA:  Without tenderness or masses.   ASSESSMENT:  1. Healthcare maintenance.  Discussed breast self-exams, seatbelt use,      healthy diet, exercise.  She is advised to add calcium to 1000 mg a      day.  Smoking cessation was discussed with behavior modification,      and referred to website [email or web address].  She is advised to increase      fluids.  2. Family planning.  Depo Provera given.  3. Nipple discharge.  In consultation with Dr. Penne Lash, Peroxin level      was done, and she was sent for a mammogram.  4. Urinary tract infection.  We will check clean catch urinalysis      today.  Return 12 months, or p.r.n., depending on today's results.           ______________________________  Caren Griffins,  CNM     DP/MEDQ  D:  03/13/2007  T:  03/14/2007  Job:  [id number]

## 2010-09-06 NOTE — Group Therapy Note (Signed)
NAME:  Andrea [name], Andrea [name] NO.:  [id number]   MEDICAL RECORD NO.:  [id number]          PATIENT TYPE:  WOC   [facility]ATION[facility]                   FACILITY:  [facility]   PHYSICIAN:  Wynelle Bourgeois, CNM    DATE OF BIRTH:  [birth year]   DATE OF SERVICE:                                  CLINIC NOTE   This is a 40 year old patient who complains of having a reddened area on  her left breast at the edge of the areola which appeared several days  after her Depo-Provera shot and was reddened, raised with induration and  caused pain in her chest and arm.  The pain was so severe that she had  trouble moving her arm.  Over the past week, the area has gotten smaller  and turned from red to dark brown.  The area of induration is smaller.  The patient comes in worried that this might be related to breast cancer  as she has a cousin who has breast cancer.   PHYSICAL EXAMINATION:  VITAL SIGNS:  Temperature 99.1, pulse 90, blood  pressure 110/73.  BREASTS:  Examination is remarkable for left breast with a 2-3 cm area  at the 2 o'clock position on the left areola at the edge where the  areola meets the breast tissue which is dark brown in color and very  slightly raised and there is a small 1-cm area of induration directly  beneath it.  This area is nontender and there is no radiating changes in  the skin around the area.  Entire breast exam is negative and axillary  exam is also negative.  There are no enlarged lymph nodes or areas of  redness seen.  Right breast is totally unremarkable.  CHEST/ABDOMEN:  Unremarkable.   IMPRESSION:  Left breast lesion, suspect resolving abscess of insect  bite.   PLAN:  Discussed options with patient to include observation or thorough  examination [facility].  Since this area  appears to be a resolving abscess of insect bite, I suggested that the  patient wait one more week to see if the area of discoloration gets  smaller.   If during that time the area becomes larger or starts becoming  painful again, we will proceed with a consultation at the [facility]  with mammography and/or ultrasonography as indicated.  But if the area  of concern continues to diminish in size and remains nonpainful, then we  will discontinue observation over time.  The patient is agreeable to  this plan.   A prescription for prenatal vitamins was given to the patient per her  request.           ______________________________  Wynelle Bourgeois, CNM     MW/MEDQ  D:  10/22/2008  T:  10/22/2008  Job:  [id number]

## 2010-09-06 NOTE — Group Therapy Note (Signed)
NAME:  Andrea [name], Andrea [name] NO.:  [id number]   MEDICAL RECORD NO.:  [id number]          PATIENT TYPE:  WOC   [facility]ATION[facility]                   [facility]   PHYSICIAN:  Allie Bossier, MD        DATE OF BIRTH:  12/17/1970   DATE OF SERVICE:                                  CLINIC NOTE   SUBJECTIVE:  Andrea [name] came in today for her annual Pap smear as well  as her next Depo-Provera shot.  She has experienced no burning.  No  itching.  No tenderness.  No dyspareunia.  No abnormalities with  urination.  She has not had any sexual intercourse for the past 2 years.   OBJECTIVE:  Upon speculum exam, there is white discharge noticed  throughout the vagina surrounding the cervix.  There is no erythematous  areas within the vagina or cervix.  Cervix was found and appreciated and  a ThinPrep brush was used to collect cells from the cervical os and  surrounding tissue.  Upon bimanual examination, there were no external  lesions on the labia majora or minora or rashes.  There was no  tenderness to palpation within the uterus or surrounding labial tissue.  Ovaries were not palpable.  Uterus without any lumps or bumps.  Upon  breast examination, no lumps or bumps were found.  No axial  lymphadenopathy and no lesions.   ASSESSMENT AND PLAN:  Annual exam was performed.  ThinPrep was sent to  the lab.  Depo-Provera shot was administered.  The patient is to follow  up this clinic for next Depo-Provera shot in 12 weeks.      Allie Bossier, MD     MCD/MEDQ  D:  05/06/2008  T:  05/07/2008  Job:  [id number]

## 2010-09-09 NOTE — Group Therapy Note (Signed)
NAME:  Andrea [name], REDDITT NO.:  [id number]   MEDICAL RECORD NO.:  [id number]          PATIENT TYPE:  WOC   [facility]ATION[facility]                   FACILITY:  [facility]   PHYSICIAN:  Kathlyn Sacramento, M.D.   DATE OF BIRTH:  1971/01/29   DATE OF SERVICE:                                    CLINIC NOTE   CHIEF COMPLAINT:  Two boils between legs and odor and discharge.   HISTORY OF PRESENT ILLNESS:  Patient is a 40 year old African American  female, who states that she is concerned because she found out her boyfriend  was seeing his ex-girlfriend and she wants to be checked for infections.  She did notice a discharge which was white in color and odor.  She also  states that her boyfriend has had recurrent boils and most recent on his  buttock and she did have intercourse with him and noticed a couple of days  later 2 boils, one on her labia that has resolved and one in her peroneal,  between her thigh and her vulva, that she states she expressed pus from  yesterday but has since closed up and stopped draining.  Patient has not had  a Pap smear in greater than one year.   PHYSICAL EXAMINATION:  VITALS:  Blood pressure 113/77, pulse 69, weight  132.7, height 5' 71/2.  GENERAL:  A well-developed, well-nourished female in no acute distress.  She  does have a 7 mm palpable mass on her left vulva which did express pus  yesterday.  No pus was able to be expressed and the right leg with the  lesion she had is no longer palpable.  She had external genitalia was normal  vaginal mucosa, cervix on anterior right, normal in appearance.  Bimanual  exam:  Uterus was 6 weeks in size.  The ovaries were not palpable.  No  cervical motion tenderness.  No pain.   PROCEDURE:  The boil was prepped in a sterile fashion with alcohol.  She did  receive 1 cc of 2% lidocaine with epi locally for anesthesia.  An  incision/drainage was done, no pus was expressed only blood.  Silver nitrite  was applied  to the wound for good hemostasis and the wound was dressed.   ASSESSMENT/PLAN:  1.  For her abscess she was given a prescription for Bactrim double      strength, 1 p.o. b.i.d. x-ray 7 days, with no refills.  2.  For vaginitis, she had GC, chlamydia, wet prep and her Pap smear was      also done, as this is not up to date.  Patient was instructed that we      will call her if any of these are abnormal.  Otherwise we will send her      a letter stating that they are normal.           ______________________________  Kathlyn Sacramento, M.D.     AC/MEDQ  D:  02/23/2005  T:  02/24/2005  Job:  [id number]

## 2010-09-09 NOTE — Op Note (Signed)
NAME:  Andrea [name], Andrea [name]                ACCOUNT NO.:  [id number]   MEDICAL RECORD NO.:  [id number]          PATIENT TYPE:  WOC   LOCATION:  [facility]                          FACILITY:  [facility]   PHYSICIAN:  Phil D. Okey Dupre, M.D.     DATE OF BIRTH:  03-18-1971   DATE OF PROCEDURE:  07/11/2004  DATE OF DISCHARGE:                                 OPERATIVE REPORT   PROCEDURE:  Diagnostic laparoscopy and tubal dye insufflation of fallopian  tubes.   PREOPERATIVE DIAGNOSIS:  Chronic pelvic pain with disabling dysmenorrhea and  primary infertility.   SURGEON:  Dr. Okey Dupre   ANESTHESIA:  General.   ESTIMATED BLOOD LOSS:  Minimal.   POSTOPERATIVE CONDITION:  Satisfactory.  No specimen sent to pathology.   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   The procedure went as follows:  Under satisfactory general anesthesia, the  patient in the dorsal semi-lithotomy position, the perineum, vagina, and  abdomen prepped and draped in the usual sterile manner.  Bimanual pelvic  examination under anesthesia revealed a uterus of normal size, shape,  consistency, anterior flexed and freely movable with normal adnexa.  A  weighted speculum was placed at posterior fourchette of the vagina.  Anterior lip of the cervix grasped with a single-tooth tenaculum.  An acorn  cannula inserted into the cervix and attached to tenaculum for mobilization  of the uterus and insufflation with methylene blue dye.  A Veress needle was  then inserted in the peritoneal cavity, below the pole of the umbilicus, and  approximately 3 L of carbon dioxide slowly insufflated in the peritoneal  cavity.  Equal tympany occurred over the entire abdominal wall, 1 cm  transverse incision made just below the umbilicus and a laparoscopic trocar  inserted in the peritoneal cavity.  Trocar removed and sleeve laparoscope  inserted and the pelvis easily visualized.  The uterus  appeared normal with  the exception of inflammatory implants on the surface and some omentum that  was held by filmy adhesions that went to the anterior surface of the uterus  as well as to the anterior abdominal wall just above the bladder.  These  were taken down with the laparoscopic scissors, and this allowed the pelvic  organs to be much more visualized, especially on the left side.  The right  fallopian tube was markedly distorted, swollen, and a large hydrosalpinx  involved almost the entire tube with septated inflammatory cysts.  Only a  portion of the tube could be seen.  The other portion was probably removed  when the patient had an ectopic pregnancy.  The ovary on that side appeared  relatively normal with the exception of the granulation from old  inflammatory disease on the surface.  The left fallopian tube was swollen  and clubbed, and the insufflation of methylene blue failed to exit either  tube, although some of the dye could be seen through the transparent areas  of hydrosalpinx on the right side.  The ovary on the left was fairly normal.  There were some small  cul-de-sac and anterior bladder implants of  endometriosis, but it was minimal.  The scope was then removed from the  sleeve.  As much of the CO2 as possible was expressed through the sleeve,  the sleeve removed, and the incision closed with a 3-0 Vicryl suture.  This  was run up to a subcuticular closure.  The speculum and acorn cannula and  tenaculum were removed from the vagina.  The patient was transferred to  recovery in satisfactory condition having tolerated the procedure well.  I  have discussed this with the patient's mother postoperatively that the  patient may do well if she wants to get pregnant with in vitro  fertilization.  I think tuboplasty would be a waste in  this patient because of the disruption of the normal anatomy of the  fallopian tubes and that if the patient continued in pain and could not  be  controlled by hormonal therapy, then bilateral salpingectomy probably would  be the best solution for this lady.      PDR/MEDQ  D:  07/11/2004  T:  07/11/2004  Job:  [id number]

## 2010-09-09 NOTE — Group Therapy Note (Signed)
NAME:  Andrea [name], GUILLOT NO.:  [id number]   MEDICAL RECORD NO.:  [id number]          PATIENT TYPE:  WOC   [facility]ATION[facility]                   [facility]ACILITY:  [facility]   PHYSICIAN:  Argentina Donovan, MD        DATE OF BIRTH:  [birth year]   DATE OF SERVICE:  06/21/2004                                    CLINIC NOTE   The patient is a 40 year old gravida 2, para 0-0-2-0 with a history of  spontaneous abortion followed by [facility] and one ectopic pregnancy treated with  methotrexate many years ago.  She has had dysmenorrhea over the past five  years which has become disabling recently to the point of severe nausea and  vomiting, diarrhea, and bloated abdominal pain.  Requires her to be in bed.  She is 5 feet 7 inches, weighs 129 pounds.  Has normal blood pressure.  No  serious illnesses.  She has been trying to get pregnant for the last year  and has had a normal pelvis by both ultrasound and pelvic CT.  We offered  her some Indocin suppositories which she was unable to get because of money  problems, but has had, since we last saw her an emergency room visit for  severe dysmenorrhea.  At that time they gave her suppositories to try but at  that time the pain had been established to the point that she did not get  any relief.  Her partner has had children and her periods are regular.  I  have discussed diagnosis with her and thought we should probably do a  diagnostic laparoscopy with tubal insufflation of dye to make the diagnosis  whether she is has an anatomical or hormonal problem.  We discussed if it is  hormonal that a decision has to be made by her as to whether to stop the  periods medically or to relieve her dysmenorrhea or continue with trying to  get pregnant.  She seems to understand this.  Is out of work primarily now  because of her disabling dysmenorrhea.  We are going to schedule her for a  diagnostic laparoscopy and tubal insufflation.      PR/MEDQ  D:  06/21/2004   T:  06/21/2004  Job:  [id number]

## 2010-09-09 NOTE — Group Therapy Note (Signed)
NAME:  Andrea [name], Andrea [name] NO.:  [id number]   MEDICAL RECORD NO.:  [id number]          PATIENT TYPE:  WOC   [facility]                   [facility]ACILITY:  [facility]   PHYSICIAN:  Argentina Donovan, MD        DATE OF BIRTH:  22-Jun-1970   DATE OF SERVICE:  03/03/2004                                    CLINIC NOTE   REASON FOR VISIT:  The patient is a 40 year old gravida 2, para 0-0-2-0 with  history of one spontaneous abortion followed by a D&C and one ectopic  pregnancy for which she was treated with methotrexate, but that has been  many years ago.  She has primary dysmenorrhea but over the past 5 years it  has been completely disabling for 2 days during her period which she is  unable to eat, keep anything down, has nausea and vomiting, diarrhea, and  severe bend-over abdominal pain that requires her to go to bed.  She is 5  feet 7 inches, weights 129 pounds whose blood pressure was 90/54.  Has never  had any serious illnesses.  Has no history of sickle cell disease or heart  problems.  She has been trying to get pregnant for the last 7 months.  She  had had a recent pelvic CT scan followed by an ultrasound which showed a  normal pelvis.   PHYSICAL EXAMINATION:  ABDOMEN:  Soft, flat, nontender.  No masses or  organomegaly.  PELVIC:  External genitalis is normal,  BUS within normal limits.  Vagina is  clean and well rugated.  The uterus anterior and normal in size, shape, and  consistency.  Normal adnexa.   Recent Pap smear in September was normal although the patient had a history  of a dysplasia for which she had cryosurgery several years ago.  The  patient, because of her attempts with pregnancy, may get difficult for [place] to  recommend stopping her ovulations although she has been on she says birth  control pills in the past but completely bled with no matter what dose she  was on, she was unable to control her periods.  Depo-Provera, I think, might  be a choice but when  she is trying to get pregnant what we will try with her  is to use Indocin suppositories q.8 h. starting 2 days before her period  each month which she is able to pinpoint and use them through the first 2  days of her period to see if this will control her dysmenorrhea and still  allow her to proceed with attempting to get pregnant.  The patient is on  multivitamins with folic acid at the present time.   IMPRESSION:  Primary disabling dysmenorrhea, possible infertility.   PLAN:  To use Indocin suppositories the next few month.  Once the patient  has gone more than a year attempting pregnancy, we will get a  hysterosalpingogram especially with her history of an ectopic pregnancy.  Her periods are very regular and her partner has had 3 children in the past.      PR/MEDQ  D:  03/03/2004  T:  03/04/2004  Job:  [id number]

## 2010-09-09 NOTE — Group Therapy Note (Signed)
NAME:  Andrea [name], Andrea [name] NO.:  [id number]   MEDICAL RECORD NO.:  [id number]          PATIENT TYPE:  WOC   [facility]ATION[facility]                   [facility]ACILITY:  [facility]   PHYSICIAN:  Tinnie Gens, MD        DATE OF BIRTH:  April 29, 1970   DATE OF SERVICE:  10/06/2005                                    CLINIC NOTE   CLINIC NOTE - October 06, 2005.   CHIEF COMPLAINT:  Followup Pap.   HISTORY OF PRESENT ILLNESS:  The patient is a 40 year old gravida 4, para 0-  0-4-0 who had a LEEP procedure in February of 2007 and is back for followup  Pap today.  She is having no complaints.  She is currently on Depo-Provera  for birth control and she does not have cycles with this.   PHYSICAL EXAMINATION:  VITAL SIGNS:  Her vital signs are as listed in the  chart.  GENERAL APPEARANCE: She is a well-developed, well-nourished black female in  no acute distress.  ABDOMEN:  Soft, nontender, nondistended.  GENITOURINARY:  Normal external female genitalia.  Vagina is pink and  rugated.  The cervix has an old LEEP scar which is visible and is  nulliparous in nature. Pap smear is obtained easily.   IMPRESSION:  History of CIN3, status post LEEP in 2007.   PLAN:  Followup Pap smear obtained today.  If this is normal will continue  q.4 month Pap smears for one year followed by q.6 month Pap smears for one  year and then back to yearly Pap smears.  If this is abnormal we will repeat  her colposcopy and followup treatment as needed.           ______________________________  Tinnie Gens, MD     TP/MEDQ  D:  10/06/2005  T:  10/06/2005  Job:  [id number]

## 2010-09-09 NOTE — Group Therapy Note (Signed)
NAME:  ZACARI, RADICK NO.:  [id number]   MEDICAL RECORD NO.:  [id number]          PATIENT TYPE:  WOC   [facility]ATION[facility]                   [facility]   PHYSICIAN:  Elsie Lincoln, MD      DATE OF BIRTH:  March 03, 1971   DATE OF SERVICE:  05/02/2005                                    CLINIC NOTE   Patient is a 40 year old para 0-0-4-0 with high grade SIL on biopsy.  She  has previously had a cryo therapy by, I think, a Dr. Sherron Ales maybe 10 years  ago.  She has had a new partner in the past and would explain the initiation  of new virus.  She understands that she needs a LEEP.  We discussed the  risks of preterm labor with her next birth.  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           ______________________________  Elsie Lincoln, MD     KL/MEDQ  D:  05/02/2005  T:  05/02/2005  Job:  [id number]

## 2010-09-09 NOTE — Group Therapy Note (Signed)
NAME:  ROLENA, KNUTSON NO.:  [id number]   MEDICAL RECORD NO.:  [id number]          PATIENT TYPE:  WOC   [facility]ATION[facility]                   FACILITY[facility]   PHYSICIAN:  Kathlyn Sacramento, M.D.   DATE OF BIRTH:  [birth year]   DATE OF SERVICE:                                    CLINIC NOTE   CHIEF COMPLAINT:  Here for a LEEP.   HISTORY OF PRESENT ILLNESS:  The patient is a 40 year old G4, P0-0-4-0 with  high-grade SIL on biopsy.  The patient discussed with Dr. Penne Lash possible  treatment and wishes to proceed with a LEEP.  The risk, benefits and  indications were explained to the patient, and she wishes to proceed.   PROCEDURE:  LEEP.  The speculum was inserted, a colposcopy was done view the  lesion, the area was anesthetized with 8 mL of lidocaine with epi in a  paracervical blocked patient.  The large Fischer cone biopsy tip was used, a  50/50 blend was used and a Fischer excision was done.  The edge was  coagulated with the ball tip and good hemostasis was obtained.  The patient  tolerated the procedure well.   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   PLAN:  The patient is to follow up for her LEEP results in 2 weeks.           ______________________________  Kathlyn Sacramento, M.D.     AC/MEDQ  D:  06/01/2005  T:  06/01/2005  Job:  [id number]

## 2010-09-13 ENCOUNTER — Encounter: Payer: Self-pay | Admitting: Internal Medicine

## 2010-09-15 ENCOUNTER — Telehealth: Payer: Self-pay | Admitting: *Deleted

## 2010-09-15 NOTE — Telephone Encounter (Signed)
Message from pt that she had been to the Urgent Care was seen for Hip pain.  Said that she was told that she has hip bursitis and needs a Steroid injection.  Pt said that she was referred to an Orthopaedic and could not afford it. Call to pt messge left that the Clinics had returned her call.  RTC from pt.  Message left that she had returned my call. Call to Sports Medicine after speaking with Dr. Aundria Rud who said that a Steroid hip injection could not be preformed in the Clinics.  Sports Medicine can to the injection.  Appointment can be scheduled in [place] at the [facility] there tomorrow at 2:00 PM.  RTC to pt message was left for her to call the Clinics as soon as possible so that she can be given the appointment in [place] if she is willing to go there or a later appointment at the [facility] office.

## 2010-09-15 NOTE — Telephone Encounter (Signed)
RTC from pt called [place] [facility] has an appointment for 09/16/2010 at 2:30 PM.

## 2010-09-16 ENCOUNTER — Ambulatory Visit (INDEPENDENT_AMBULATORY_CARE_PROVIDER_SITE_OTHER): Payer: Self-pay | Admitting: Family Medicine

## 2010-09-16 ENCOUNTER — Encounter: Payer: Self-pay | Admitting: Family Medicine

## 2010-09-16 VITALS — BP 109/78 | HR 103 | Temp 98.6°F | Ht 68.0 in | Wt 149.2 lb

## 2010-09-16 DIAGNOSIS — M25552 Pain in left hip: Secondary | ICD-10-CM | POA: Insufficient documentation

## 2010-09-16 DIAGNOSIS — M25559 Pain in unspecified hip: Secondary | ICD-10-CM

## 2010-09-16 DIAGNOSIS — G5700 Lesion of sciatic nerve, unspecified lower limb: Secondary | ICD-10-CM

## 2010-09-16 MED ORDER — MELOXICAM 15 MG PO TABS: 15.0000 mg | ORAL_TABLET | Freq: Every day | ORAL | Status: DC

## 2010-09-16 NOTE — Patient Instructions (Signed)
You have piriformis syndrome - pain and muscle spasms in a muscle deep in your buttock that runs near the sciatic nerve. Physical therapy is the most important part of treatment - they will work with you on specific stretches and exercises to help break up the spasm and relieve the pain. Meloxicam 15mg  daily with food as needed for pain and inflammation. Ok to take tylenol with this. Try to avoid painful activities outside of physical therapy. Tennis ball to massage area when sitting Follow up with me in 6 weeks to recheck on your progress.

## 2010-09-16 NOTE — Assessment & Plan Note (Signed)
2/2 piriformis syndrome.  No signs/symptoms of lumbar radiculopathy, intraarticular hip pathology.  Start physical therapy and home exercises/stretches.  Icing or heat.  Tennis ball massage.  Mobic for pain and inflammation.  Handout provided.  See instructions for further.  F/u in 6 weeks for reevaluation.

## 2010-09-16 NOTE — Progress Notes (Signed)
Subjective:    Patient ID: Andrea [name], female    DOB: 01-10-1971, 40 y.o.   MRN: [id number]  HPI  40 yo F here for left hip pain.  Patient denies know injury. States about 3 months ago pain started lateral left hip and buttock and has worsened since that time. Went to urgent care and was told she had a bursitis Was given prednisone dose pack which did not help. Aleve was some help Went back to urgent care a second time and advised to follow up with an orthopedic surgeon but she didn't have the money for this appointment. Has tried heat and ice - heat seems to help her more. Pain worse with certain sitting positions and with prolonged standing. No numbness or tingling No back pain or groin pain. Pain goes from left buttock to about mid-thigh posteriorly. No prior back or left hip problems. No bowel or bladder dysfunction.  History reviewed. No pertinent past medical history.  Current Outpatient Prescriptions on File Prior to Visit  Medication Sig Dispense Refill  . varenicline (CHANTIX) 0.5 MG tablet Take 0.5 mg by mouth as directed. Chantix starting month pak .5 MG X 11 and 1 MG X 42 Tabs, Use as directed       . DISCONTD: fluconazole (DIFLUCAN) 150 MG tablet Take 150 mg by mouth once. Take 1 tablet by mouth for yeast infection       Current Facility-Administered Medications on File Prior to Visit  Medication Dose Route Frequency Provider Last Rate Last Dose  . medroxyPROGESTERone (DEPO-PROVERA) injection 150 mg  150 mg Intramuscular Q90 days Thedore Mins, RN   150 mg at 08/23/10 1517    No past surgical history on file.  Allergies  Allergen Reactions  . Hydrocodone     History   Social History  . Marital Status: Single    Spouse Name: N/A    Number of Children: N/A  . Years of Education: N/A   Occupational History  . Not on file.   Social History Main Topics  . Smoking status: Current Everyday Smoker -- 1.0 packs/day    Types: Cigarettes  . Smokeless  tobacco: Not on file  . Alcohol Use: Not on file  . Drug Use: Not on file  . Sexually Active: Not on file   Other Topics Concern  . Not on file   Social History Narrative  . No narrative on file    Family History  Problem Relation Age of Onset  . Diabetes Mother   . Hypertension Father   . Heart attack Maternal Grandfather   . Hypertension Maternal Grandfather   . Heart attack Paternal Grandfather   . Hypertension Paternal Grandfather     BP 109/78  Pulse 103  Temp(Src) 98.6 F (37 C) (Oral)  Ht 5\' 8"  (1.727 m)  Wt 149 lb 3.2 oz (67.677 kg)  BMI 22.69 kg/m2  Review of Systems See HPI above.    Objective:   Physical Exam Gen: NAD - appears uncomfortable leaning to right side. Back: No gross deformity, scoliosis. FROM without pain. No focal midline or paraspinal TTP.  TTP within left buttock at piriformis. Strength 5/5 BLEs except 4/5 with left hip abduction, ext rotation, flexion limited by pain. MSRs 1+ bilateral patellar tendons, 2+ bilateral achilles and equal. NVI distally Sensation intact to light touch bilaterally  L hip: No gross deformity. No focal TTP greater trochanter, anterior hip. FROM with negative logroll (pain in buttock with this). Negative fabers.  + piriformis  stretch.     Assessment & Plan:  1. Left hip pain - 2/2 piriformis syndrome.  No signs/symptoms of lumbar radiculopathy, intraarticular hip pathology.  Start physical therapy and home exercises/stretches.  Icing or heat.  Tennis ball massage.  Mobic for pain and inflammation.  Handout provided.  See instructions for further.  F/u in 6 weeks for reevaluation.

## 2010-09-29 ENCOUNTER — Ambulatory Visit: Payer: Self-pay | Attending: Family Medicine | Admitting: Rehabilitative and Restorative Service Providers"

## 2010-09-29 DIAGNOSIS — M25559 Pain in unspecified hip: Secondary | ICD-10-CM | POA: Insufficient documentation

## 2010-09-29 DIAGNOSIS — M2569 Stiffness of other specified joint, not elsewhere classified: Secondary | ICD-10-CM | POA: Insufficient documentation

## 2010-09-29 DIAGNOSIS — IMO0001 Reserved for inherently not codable concepts without codable children: Secondary | ICD-10-CM | POA: Insufficient documentation

## 2010-10-11 ENCOUNTER — Ambulatory Visit: Payer: Self-pay | Admitting: Physical Therapy

## 2010-10-13 ENCOUNTER — Ambulatory Visit: Payer: Self-pay | Admitting: Physical Therapy

## 2010-10-20 ENCOUNTER — Ambulatory Visit: Payer: Self-pay

## 2010-10-24 ENCOUNTER — Ambulatory Visit: Payer: Self-pay | Attending: Family Medicine | Admitting: Physical Therapy

## 2010-10-24 DIAGNOSIS — M25559 Pain in unspecified hip: Secondary | ICD-10-CM | POA: Insufficient documentation

## 2010-10-24 DIAGNOSIS — IMO0001 Reserved for inherently not codable concepts without codable children: Secondary | ICD-10-CM | POA: Insufficient documentation

## 2010-10-24 DIAGNOSIS — M25669 Stiffness of unspecified knee, not elsewhere classified: Secondary | ICD-10-CM | POA: Insufficient documentation

## 2010-10-27 ENCOUNTER — Ambulatory Visit: Payer: Self-pay | Admitting: Rehabilitation

## 2010-10-28 ENCOUNTER — Encounter: Payer: Self-pay | Admitting: Family Medicine

## 2010-10-28 ENCOUNTER — Ambulatory Visit (INDEPENDENT_AMBULATORY_CARE_PROVIDER_SITE_OTHER): Payer: Self-pay | Admitting: Family Medicine

## 2010-10-28 VITALS — BP 113/77 | HR 79 | Temp 98.1°F | Ht 68.0 in | Wt 142.0 lb

## 2010-10-28 DIAGNOSIS — M25552 Pain in left hip: Secondary | ICD-10-CM

## 2010-10-28 DIAGNOSIS — M25559 Pain in unspecified hip: Secondary | ICD-10-CM

## 2010-10-28 NOTE — Progress Notes (Signed)
Subjective:    Patient ID: Andrea [name], female    DOB: 08-15-1970, 40 y.o.   MRN: [id number]  HPI   40 yo F here for 6 week f/u left hip pain.  Initial OV 5/25: Patient denies know injury. States about 3 months ago pain started lateral left hip and buttock and has worsened since that time. Went to urgent care and was told she had a bursitis Was given prednisone dose pack which did not help. Aleve was some help Went back to urgent care a second time and advised to follow up with an orthopedic surgeon but she didn't have the money for this appointment. Has tried heat and ice - heat seems to help her more. Pain worse with certain sitting positions and with prolonged standing. No numbness or tingling No back pain or groin pain. Pain goes from left buttock to about mid-thigh posteriorly. No prior back or left hip problems. No bowel or bladder dysfunction.  Today: Patient reports pain has improved, down to a 5-6/10 from 9-10/10. Strength improving in physical therapy. Able to stand for much longer than before. Physical therapy, massage, home exercises helping. Still gets pain that starts in left buttock and goes into posterior thigh, occasionally past knee. No associated numbness or tingling. No back or groin pain. Has not tried tennis ball massage yet.  History reviewed. No pertinent past medical history.  Current Outpatient Prescriptions on File Prior to Visit  Medication Sig Dispense Refill  . meloxicam (MOBIC) 15 MG tablet Take 1 tablet (15 mg total) by mouth daily. With food.  30 tablet  1  . varenicline (CHANTIX) 0.5 MG tablet Take 0.5 mg by mouth as directed. Chantix starting month pak .5 MG X 11 and 1 MG X 42 Tabs, Use as directed        Current Facility-Administered Medications on File Prior to Visit  Medication Dose Route Frequency Provider Last Rate Last Dose  . medroxyPROGESTERone (DEPO-PROVERA) injection 150 mg  150 mg Intramuscular Q90 days Thedore Mins, RN    150 mg at 08/23/10 1517    No past surgical history on file.  Allergies  Allergen Reactions  . Hydrocodone     History   Social History  . Marital Status: Single    Spouse Name: N/A    Number of Children: N/A  . Years of Education: N/A   Occupational History  . Not on file.   Social History Main Topics  . Smoking status: Current Everyday Smoker -- 0.5 packs/day    Types: Cigarettes  . Smokeless tobacco: Not on file  . Alcohol Use: Not on file  . Drug Use: Not on file  . Sexually Active: Not on file   Other Topics Concern  . Not on file   Social History Narrative  . No narrative on file    Family History  Problem Relation Age of Onset  . Diabetes Mother   . Hypertension Father   . Heart attack Maternal Grandfather   . Hypertension Maternal Grandfather   . Heart attack Paternal Grandfather   . Hypertension Paternal Grandfather     BP 113/77  Pulse 79  Temp(Src) 98.1 F (36.7 C) (Oral)  Ht 5\' 8"  (1.727 m)  Wt 142 lb (64.411 kg)  BMI 21.59 kg/m2  Review of Systems  See HPI above.    Objective:   Physical Exam  Gen: NAD - appears uncomfortable leaning to right side. Back: No gross deformity, scoliosis. FROM without pain. TTP within left buttock  at piriformis. Strength 5/5 BLEs except 4+/5 with left hip abduction, ext rotation, flexion.  Only hip abduction painful. MSRs 1+ bilateral patellar tendons, 2+ bilateral achilles and equal. NVI distally Sensation intact to light touch bilaterally  L hip: No gross deformity. No focal TTP greater trochanter, anterior hip. FROM with negative logroll. Negative fabers.  + piriformis stretch on left reproducing stretch at area of pain.     Assessment & Plan:  1. Left hip pain - History and exam consistent with piriformis syndrome.  No signs/symptoms of lumbar radiculopathy, intraarticular hip pathology.  Is improving with physical therapy and home exercises/stretches though this is slow going.  Icing or  heat as needed.  Tennis ball massage.  Mobic for pain and inflammation.  See instructions for further.  F/u in 4-6 weeks for reevaluation.  Can consider lumbar MRI if not improving but exam and history are classic for piriformis syndrome.

## 2010-10-28 NOTE — Assessment & Plan Note (Signed)
History and exam consistent with piriformis syndrome.  No signs/symptoms of lumbar radiculopathy, intraarticular hip pathology.  Is improving with physical therapy and home exercises/stretches though this is slow going.  Icing or heat as needed.  Tennis ball massage.  Mobic for pain and inflammation.  See instructions for further.  F/u in 4-6 weeks for reevaluation.  Can consider lumbar MRI if not improving but exam and history are classic for piriformis syndrome.

## 2010-10-28 NOTE — Patient Instructions (Signed)
You have piriformis syndrome - pain and muscle spasms in a muscle deep in your buttock that runs near the sciatic nerve. The irritation of this nerve will cause pain to shoot through back of thigh past knee also. Continue with physical therapy and home stretches/exercises. Meloxicam 15mg  daily with food as needed for pain and inflammation. Ok to take tylenol with this. Try to avoid painful activities outside of physical therapy. Tennis ball to massage area when sitting. Follow up with me in 1 month to recheck on your progress. If you do not continue to improve as expected, we'll consider further imaging on your low back (even with a pinched nerve in the back, most people improve with the above therapies - MRI is considered if not improving and are considering shots in your back or surgery which most people do not need).

## 2010-11-03 ENCOUNTER — Ambulatory Visit: Payer: Self-pay | Admitting: Physical Therapy

## 2010-11-08 ENCOUNTER — Ambulatory Visit: Payer: Self-pay | Admitting: Physical Therapy

## 2010-11-09 ENCOUNTER — Other Ambulatory Visit: Payer: Self-pay | Admitting: *Deleted

## 2010-11-09 NOTE — Telephone Encounter (Signed)
Needs directions

## 2010-11-14 ENCOUNTER — Ambulatory Visit (INDEPENDENT_AMBULATORY_CARE_PROVIDER_SITE_OTHER): Payer: Self-pay | Admitting: *Deleted

## 2010-11-14 ENCOUNTER — Ambulatory Visit: Payer: Self-pay | Admitting: Physical Therapy

## 2010-11-14 DIAGNOSIS — Z309 Encounter for contraceptive management, unspecified: Secondary | ICD-10-CM

## 2010-11-18 ENCOUNTER — Ambulatory Visit: Payer: Self-pay | Admitting: Rehabilitative and Restorative Service Providers"

## 2010-11-21 NOTE — Telephone Encounter (Signed)
Pt called-not accepting calls ; [facility] made awared pt needs to call the clinic.

## 2010-11-24 ENCOUNTER — Other Ambulatory Visit: Payer: Self-pay | Admitting: *Deleted

## 2010-11-24 MED ORDER — VARENICLINE TARTRATE 0.5 MG PO TABS: 0.5000 mg | ORAL_TABLET | Freq: Two times a day (BID) | ORAL | Status: DC

## 2010-11-24 NOTE — Telephone Encounter (Signed)
Message from [facility] -( pharmacy) and they spoke with pt today ( 8/2).  Pt just started chantix starter pack.  She has NOT stopped smoking yet. They are asking for chantix continuing pack

## 2010-11-25 NOTE — Telephone Encounter (Signed)
Continuing pack is 1mg  twice daily , the directions were changed for this and #60 w/ 3 refills given

## 2010-11-28 ENCOUNTER — Ambulatory Visit (INDEPENDENT_AMBULATORY_CARE_PROVIDER_SITE_OTHER): Payer: Self-pay | Admitting: Family Medicine

## 2010-11-28 ENCOUNTER — Ambulatory Visit: Payer: Self-pay | Admitting: Family Medicine

## 2010-11-28 ENCOUNTER — Encounter: Payer: Self-pay | Admitting: Family Medicine

## 2010-11-28 VITALS — BP 108/69 | HR 71 | Temp 97.9°F | Ht 68.0 in | Wt 142.0 lb

## 2010-11-28 DIAGNOSIS — M79605 Pain in left leg: Secondary | ICD-10-CM

## 2010-11-28 DIAGNOSIS — M545 Low back pain: Secondary | ICD-10-CM

## 2010-11-28 DIAGNOSIS — M25552 Pain in left hip: Secondary | ICD-10-CM

## 2010-11-28 DIAGNOSIS — M25559 Pain in unspecified hip: Secondary | ICD-10-CM

## 2010-11-29 ENCOUNTER — Encounter: Payer: Self-pay | Admitting: Family Medicine

## 2010-11-29 DIAGNOSIS — M79605 Pain in left leg: Secondary | ICD-10-CM

## 2010-11-29 DIAGNOSIS — M5136 Other intervertebral disc degeneration, lumbar region: Secondary | ICD-10-CM | POA: Insufficient documentation

## 2010-11-29 DIAGNOSIS — M545 Low back pain, unspecified: Secondary | ICD-10-CM

## 2010-11-29 HISTORY — DX: Low back pain, unspecified: M54.50

## 2010-11-29 HISTORY — DX: Pain in left leg: M79.605

## 2010-11-29 NOTE — Assessment & Plan Note (Addendum)
Left hip/low back pain - Her exam and history are still most consistent with piriformis syndrome with sciatic nerve irritation.  However, would expect more improvement with long course of PT, nsaids, home exercises/stretches.  Her symptoms could also indicate lumbar radiculopathy - will assess with lumbar MRI to find possible source of lumbar radiculopathy.  Continue home exercises, stretches.  Ice/heat prn, tennis ball massage, ibuprofen prn.  I do not think she has any hip pathology based on exam and history - pelvic and hip MRIs unlikely to be helpful.  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  Called and left voicemail with results (per her request) that we have a couple options : 1) continue with current treatment and capsaicin (reported this helps and will send in a script for her) and if not improving more in next month, refer for consideration for ESIs or 2) trial ESIs at this point with Dr. Maurice Small to see if she gets benefit - advised her to call me with which she would like to do or any questions.

## 2010-11-29 NOTE — Progress Notes (Signed)
Subjective:    Patient ID: Andrea [name], female    DOB: 01/10/1971, 40 y.o.   MRN: [id number]  HPI   40 yo F here for 6 week f/u left hip pain.  Initial OV 5/25: Patient denies know injury. States about 3 months ago pain started lateral left hip and buttock and has worsened since that time. Went to urgent care and was told she had a bursitis Was given prednisone dose pack which did not help. Aleve was some help Went back to urgent care a second time and advised to follow up with an orthopedic surgeon but she didn't have the money for this appointment. Has tried heat and ice - heat seems to help her more. Pain worse with certain sitting positions and with prolonged standing. No numbness or tingling No back pain or groin pain. Pain goes from left buttock to about mid-thigh posteriorly. No prior back or left hip problems. No bowel or bladder dysfunction.  7/6: Patient reports pain has improved, down to a 5-6/10 from 9-10/10. Strength improving in physical therapy. Able to stand for much longer than before. Physical therapy, massage, home exercises helping. Still gets pain that starts in left buttock and goes into posterior thigh, occasionally past knee. No associated numbness or tingling. No back or groin pain. Has not tried tennis ball massage yet.  Today: Patient still improved with pain now 5/10 but hasn't made much progress since last visit a month ago. Still doing home stretches and exercises and finished PT - they expressed in PT they expect her to be better than she is at this point with diagnosis of piriformis syndrome. Now taking aleve occasionally but using lidoderm patches and a liquid rub (that has capsicum in it) that also have helped with pain. Has radiation of pain from left buttock down back of left leg to about knee. Worse with prolonged sitting. No numbness/tingling. No groin pain.  History reviewed. No pertinent past medical history.  Current Outpatient  Prescriptions on File Prior to Visit  Medication Sig Dispense Refill  . meloxicam (MOBIC) 15 MG tablet Take 1 tablet (15 mg total) by mouth daily. With food.  30 tablet  1  . varenicline (CHANTIX) 0.5 MG tablet Take 1 tablet (0.5 mg total) by mouth 2 (two) times daily. Chantix starting month pak .5 MG X 11 and 1 MG X 42 Tabs, Use as directed  60 tablet  3   Current Facility-Administered Medications on File Prior to Visit  Medication Dose Route Frequency Provider Last Rate Last Dose  . medroxyPROGESTERone (DEPO-PROVERA) injection 150 mg  150 mg Intramuscular Q90 days Thedore Mins, RN   150 mg at 11/14/10 1520    No past surgical history on file.  Allergies  Allergen Reactions  . Hydrocodone     History   Social History  . Marital Status: Single    Spouse Name: N/A    Number of Children: N/A  . Years of Education: N/A   Occupational History  . Not on file.   Social History Main Topics  . Smoking status: Current Everyday Smoker -- 0.5 packs/day    Types: Cigarettes  . Smokeless tobacco: Not on file  . Alcohol Use: Not on file  . Drug Use: Not on file  . Sexually Active: Not on file   Other Topics Concern  . Not on file   Social History Narrative  . No narrative on file    Family History  Problem Relation Age of Onset  .  Diabetes Mother   . Hypertension Father   . Heart attack Maternal Grandfather   . Hypertension Maternal Grandfather   . Heart attack Paternal Grandfather   . Hypertension Paternal Grandfather     BP 108/69  Pulse 71  Temp(Src) 97.9 F (36.6 C) (Oral)  Ht 5\' 8"  (1.727 m)  Wt 142 lb (64.411 kg)  BMI 21.59 kg/m2  Review of Systems  See HPI above.    Objective:   Physical Exam  Gen: NAD - appears uncomfortable leaning to right side. Back: No gross deformity, scoliosis. FROM without pain. TTP within left buttock at piriformis, mild left lower lumbar paraspinal TTP.  No midline or right TTP. Strength 5/5 BLEs except 4+/5 with  left hip abduction, ext rotation.  Only hip abduction painful. MSRs 1+ bilateral patellar tendons, 2+ bilateral achilles and equal. NVI distally Sensation intact to light touch bilaterally SLRs negative (only stretch left buttock to mid-posterior thigh).  L hip: No gross deformity. No focal TTP greater trochanter, anterior hip. FROM with negative logroll. Negative fabers.  + piriformis stretch on left reproducing stretch at area of pain.     Assessment & Plan:  1. Left hip/low back pain - Her exam and history are still most consistent with piriformis syndrome with sciatic nerve irritation.  However, would expect more improvement with long course of PT, nsaids, home exercises/stretches.  Her symptoms could also indicate lumbar radiculopathy - will assess with lumbar MRI to find possible source of lumbar radiculopathy.  Continue home exercises, stretches.  Ice/heat prn, tennis ball massage, ibuprofen prn.  I do not think she has any hip pathology based on exam and history - pelvic and hip MRIs unlikely to be helpful.

## 2010-11-29 NOTE — Assessment & Plan Note (Signed)
Left hip/low back pain - Her exam and history are still most consistent with piriformis syndrome with sciatic nerve irritation.  However, would expect more improvement with long course of PT, nsaids, home exercises/stretches.  Her symptoms could also indicate lumbar radiculopathy - will assess with lumbar MRI to find possible source of lumbar radiculopathy.  Continue home exercises, stretches.  Ice/heat prn, tennis ball massage, ibuprofen prn.  I do not think she has any hip pathology based on exam and history - pelvic and hip MRIs unlikely to be helpful.

## 2010-12-03 ENCOUNTER — Ambulatory Visit (HOSPITAL_BASED_OUTPATIENT_CLINIC_OR_DEPARTMENT_OTHER)
Admission: RE | Admit: 2010-12-03 | Discharge: 2010-12-03 | Disposition: A | Discharge status: Home / Self Care | Payer: Self-pay | Source: Ambulatory Visit | Attending: Family Medicine | Admitting: Family Medicine

## 2010-12-03 DIAGNOSIS — M5137 Other intervertebral disc degeneration, lumbosacral region: Secondary | ICD-10-CM

## 2010-12-03 DIAGNOSIS — M545 Low back pain: Secondary | ICD-10-CM

## 2010-12-03 DIAGNOSIS — M51379 Other intervertebral disc degeneration, lumbosacral region without mention of lumbar back pain or lower extremity pain: Secondary | ICD-10-CM | POA: Insufficient documentation

## 2010-12-03 DIAGNOSIS — IMO0001 Reserved for inherently not codable concepts without codable children: Secondary | ICD-10-CM

## 2010-12-03 DIAGNOSIS — M5126 Other intervertebral disc displacement, lumbar region: Secondary | ICD-10-CM

## 2010-12-05 MED ORDER — CAPSAICIN 0.1 % EX CREA: TOPICAL_CREAM | CUTANEOUS | Status: DC

## 2010-12-05 NOTE — Progress Notes (Addendum)
Addended by: Lenda Kelp on: 12/05/2010 12:07 PM  Modules accepted: Orders  Spoke with patient about next steps.  She only has [facility] coverage and prescription cards - we discussed referral to physiatrist for trial of ESIs but there aren't any that accept her coverage and none in the [facility] system.  She is using a topical preparation that has capsicum in it that is helping but not enough to her liking.  Will trial nortriptyline 25mg  at bedtime with room to go up on this.

## 2010-12-16 MED ORDER — NORTRIPTYLINE HCL 25 MG PO CAPS: 25.0000 mg | ORAL_CAPSULE | Freq: Every day | ORAL | Status: DC

## 2010-12-16 NOTE — Progress Notes (Signed)
Addended by: Lenda Kelp on: 12/16/2010 11:47 AM   Modules accepted: Orders

## 2011-01-23 LAB — POCT PREGNANCY, URINE: Preg Test, Ur: NEGATIVE

## 2011-01-31 LAB — POCT URINALYSIS DIP (DEVICE)
Ketones, ur: 15 — AB
Nitrite: NEGATIVE
Specific Gravity, Urine: >=1.03
Urobilinogen, UA: 0.2

## 2011-02-02 LAB — URINALYSIS, ROUTINE W REFLEX MICROSCOPIC
Leukocytes, UA: NEGATIVE
Nitrite: NEGATIVE
Protein, ur: NEGATIVE
Specific Gravity, Urine: 1.015
Urobilinogen, UA: 0.2
pH: 5.5

## 2011-02-02 LAB — URINE MICROSCOPIC-ADD ON

## 2011-02-03 ENCOUNTER — Ambulatory Visit (INDEPENDENT_AMBULATORY_CARE_PROVIDER_SITE_OTHER): Payer: Self-pay | Admitting: *Deleted

## 2011-02-03 ENCOUNTER — Ambulatory Visit: Payer: Self-pay

## 2011-02-03 DIAGNOSIS — Z309 Encounter for contraceptive management, unspecified: Secondary | ICD-10-CM

## 2011-02-03 MED ORDER — MEDROXYPROGESTERONE ACETATE 150 MG/ML IM SUSP
150.0000 mg | Freq: Once | INTRAMUSCULAR | Status: AC
  Administered 2011-02-03: 150 mg via INTRAMUSCULAR

## 2011-02-09 ENCOUNTER — Inpatient Hospital Stay (INDEPENDENT_AMBULATORY_CARE_PROVIDER_SITE_OTHER)
Admission: RE | Admit: 2011-02-09 | Discharge: 2011-02-09 | Disposition: A | Discharge status: Home / Self Care | Payer: Self-pay | Source: Ambulatory Visit | Attending: Family Medicine | Admitting: Family Medicine

## 2011-02-09 DIAGNOSIS — B9789 Other viral agents as the cause of diseases classified elsewhere: Secondary | ICD-10-CM

## 2011-02-09 DIAGNOSIS — K5289 Other specified noninfective gastroenteritis and colitis: Secondary | ICD-10-CM

## 2011-02-23 ENCOUNTER — Ambulatory Visit: Payer: Self-pay | Admitting: Family Medicine

## 2011-04-27 ENCOUNTER — Ambulatory Visit (INDEPENDENT_AMBULATORY_CARE_PROVIDER_SITE_OTHER): Payer: Self-pay | Admitting: *Deleted

## 2011-04-27 DIAGNOSIS — Z309 Encounter for contraceptive management, unspecified: Secondary | ICD-10-CM

## 2011-04-27 DIAGNOSIS — N946 Dysmenorrhea, unspecified: Secondary | ICD-10-CM

## 2011-07-19 ENCOUNTER — Ambulatory Visit (INDEPENDENT_AMBULATORY_CARE_PROVIDER_SITE_OTHER): Payer: Self-pay | Admitting: *Deleted

## 2011-07-19 DIAGNOSIS — Z309 Encounter for contraceptive management, unspecified: Secondary | ICD-10-CM

## 2011-07-19 DIAGNOSIS — N946 Dysmenorrhea, unspecified: Secondary | ICD-10-CM

## 2011-10-10 ENCOUNTER — Encounter: Payer: Self-pay | Admitting: Internal Medicine

## 2011-10-11 ENCOUNTER — Ambulatory Visit (INDEPENDENT_AMBULATORY_CARE_PROVIDER_SITE_OTHER): Payer: Self-pay | Admitting: Internal Medicine

## 2011-10-11 VITALS — BP 100/60 | HR 76 | Temp 97.6°F | Wt 153.1 lb

## 2011-10-11 DIAGNOSIS — Z87891 Personal history of nicotine dependence: Secondary | ICD-10-CM

## 2011-10-11 DIAGNOSIS — M79609 Pain in unspecified limb: Secondary | ICD-10-CM

## 2011-10-11 DIAGNOSIS — M545 Low back pain, unspecified: Secondary | ICD-10-CM

## 2011-10-11 DIAGNOSIS — M25552 Pain in left hip: Secondary | ICD-10-CM

## 2011-10-11 DIAGNOSIS — Z3049 Encounter for surveillance of other contraceptives: Secondary | ICD-10-CM

## 2011-10-11 DIAGNOSIS — Z139 Encounter for screening, unspecified: Secondary | ICD-10-CM

## 2011-10-11 DIAGNOSIS — Z720 Tobacco use: Secondary | ICD-10-CM

## 2011-10-11 DIAGNOSIS — N946 Dysmenorrhea, unspecified: Secondary | ICD-10-CM

## 2011-10-11 DIAGNOSIS — F17200 Nicotine dependence, unspecified, uncomplicated: Secondary | ICD-10-CM

## 2011-10-11 LAB — CBC
HCT: 42 % (ref 36.0–46.0)
Hemoglobin: 14.3 g/dL (ref 12.0–15.0)
MCHC: 34 g/dL (ref 30.0–36.0)
MCV: 96.3 fL (ref 78.0–100.0)
RDW: 12.7 % (ref 11.5–15.5)
WBC: 4.4 10*3/uL (ref 4.0–10.5)

## 2011-10-11 MED ORDER — MEDROXYPROGESTERONE ACETATE 150 MG/ML IM SUSP: 150.0000 mg | Freq: Once | INTRAMUSCULAR | Status: DC

## 2011-10-11 NOTE — Assessment & Plan Note (Signed)
Has prob

## 2011-10-11 NOTE — Assessment & Plan Note (Signed)
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 Mild overall spinal stenosis.  Mild to moderate L4-L5 facet degeneration.  Currently denies any focal neurological deficits. -Being followed by sport medicine -Will refer to [facility] or [facility] for neurosurgery evaluation since [facility] only has [facility] card.  Currently not asking for any narcotics.  If she continues to have pain, numbness or tingling, may try gabapentin while waiting for referral.

## 2011-10-11 NOTE — Patient Instructions (Addendum)
Return in  3 months for next depo-shot Will need to get mammogram and pap-smear Call [phone]-quit now

## 2011-10-11 NOTE — Assessment & Plan Note (Signed)
Last Depo shot was 07/19/11.  -Will give depo shot today -Plan for pap smear next office visit -check CBC, CMP

## 2011-10-11 NOTE — Progress Notes (Signed)
HPI: Ms. Stieber is a 41 yo woman with PMH of dysmenorrhea, left hip and back pain presents today for DEPO shot. Last depo shot was on 07/19/2011.  She has been taking calcium and multi-vitamin occasionally and iron supplement because she was told that she is borderline anemic.  She is still smoking (esp when she is stressed out) because she could not tolerate Chantix, it made her nauseous.  She also tried gum but did not like the taste.  Would like to try patch or Bupropion.   Menstrual cycle:no spotting. No sexual intercourse in 5 years. Had pap smear 2-3 years ago.  Last mammogram was in 2008.   Left lower back and leg pain with radiation to her lower leg.  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 Mild overall spinal stenosis. Mild to moderate L4-L5 facet degeneration.  She has been seen by sport medicine and has not seen neurosurgery because she only has [facility] card.  She denies any focal neurological deficits at this time and knows that she needs to go to ED if she experiences acute onset of neurological deficits.  Currently not taking any narcotics and try to "ride it out" by sleeping.  No other complaints.  Doing well in school, going back for a second degree. Happy that she has put on more weight.   ROS: as per HPI  PE: General: alert, well-developed, and cooperative to examination.  Lungs: normal respiratory effort, no accessory muscle use, normal breath sounds, no crackles, and no wheezes. Heart: normal rate, regular rhythm, no murmur, no gallop, and no rub.  Abdomen: soft, non-tender, normal bowel sounds, no distention, no guarding, no rebound tenderness Pulses: 2+ DP/PT pulses bilaterally Extremities: No cyanosis, clubbing, edema.  No back tenderness, neg straight leg raising test. Neurologic: nonfocal

## 2011-10-11 NOTE — Assessment & Plan Note (Signed)
Patient has tried both Chantix and gum but made her feel nauseous.  She currently has [facility] card and no other insurance -Gave 1-800 quit now and advised her to call  -Bupropion is another option but it is not available at the health department.   -I will continue to counsel patient on smoking cessation especially she is getting depo-shot which could increase her risk of blood clot.  Again, I re-iterate the risks and she verbalized her understanding.

## 2011-10-12 LAB — COMPLETE METABOLIC PANEL WITH GFR
AST: 15 U/L (ref 0–37)
Alkaline Phosphatase: 71 U/L (ref 39–117)
BUN: 5 mg/dL — ABNORMAL LOW (ref 6–23)
Calcium: 9.4 mg/dL (ref 8.4–10.5)
Chloride: 105 mEq/L (ref 96–112)
Creat: 1 mg/dL (ref 0.50–1.10)
GFR, Est African American: 81 mL/min

## 2011-11-09 ENCOUNTER — Ambulatory Visit: Payer: Self-pay

## 2011-11-13 ENCOUNTER — Ambulatory Visit: Payer: Self-pay

## 2011-11-14 NOTE — Addendum Note (Signed)
Addended by: Neomia Dear on: 11/14/2011 06:36 AM   Modules accepted: Orders

## 2011-12-06 ENCOUNTER — Ambulatory Visit (INDEPENDENT_AMBULATORY_CARE_PROVIDER_SITE_OTHER): Payer: Self-pay | Admitting: Internal Medicine

## 2011-12-06 ENCOUNTER — Encounter: Payer: Self-pay | Admitting: Internal Medicine

## 2011-12-06 VITALS — BP 134/82 | HR 107 | Temp 98.0°F | Wt 149.6 lb

## 2011-12-06 DIAGNOSIS — M545 Low back pain: Secondary | ICD-10-CM

## 2011-12-06 DIAGNOSIS — M79605 Pain in left leg: Secondary | ICD-10-CM

## 2011-12-06 DIAGNOSIS — K0889 Other specified disorders of teeth and supporting structures: Secondary | ICD-10-CM | POA: Insufficient documentation

## 2011-12-06 DIAGNOSIS — K089 Disorder of teeth and supporting structures, unspecified: Secondary | ICD-10-CM

## 2011-12-06 DIAGNOSIS — M79609 Pain in unspecified limb: Secondary | ICD-10-CM

## 2011-12-06 MED ORDER — GABAPENTIN 300 MG PO CAPS: ORAL_CAPSULE | ORAL | Status: DC

## 2011-12-06 NOTE — Assessment & Plan Note (Addendum)
Of right lower jaw last molar region.  -Dental referral. -Gabapentin for pain mgmt and to help with sleep during acute pain (this medication was chosen given her description of neuropathic type pain, though she did describe pain similar to when she had dental work on the left side, so I don't think that it is necessarily tic [place]) -If pain mgmt is not appropriate, may consider tramadol (no h/o sz d/o), vicodin with benadryl, or percocet for acute pain.  She has tried alieve, ibuprofen and tylenol without relief. -Amox & Flagyl

## 2011-12-06 NOTE — Patient Instructions (Signed)
I am prescribing Gabapentin for your pain.  To start, take 300mg  by mouth before bed.  On day 2, you may increase to 300mg  twice daily.  If needed, on day 3, you may increase to 300mg  three times daily.  Call if you have any questions regarding this medication, or if you feel like you are having any side effects. This medication can make you sleepy.  If you feel that your pain is not being well managed, call the clinic as well.  If you start to feel worse, also call the clinic.  I have referred you to dental clinic.  Please be sure to bring all of your medications with you to every visit.  Should you have any new or worsening symptoms, please be sure to call the clinic at [phone].

## 2011-12-06 NOTE — Assessment & Plan Note (Signed)
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 She reports that the shooting tingling pain to her left back and excruciating at times. No urinary or bowel incontinence.  -Gabapentin 300 mg before bed once daily, to taper up to 3 times daily

## 2011-12-06 NOTE — Progress Notes (Signed)
Subjective:   Patient ID: Andrea [name] female   DOB: 02-28-1971 41 y.o.   MRN: [id number]  HPI: [name] is a 41 y.o. -year-old woman with history of low back pain and dysmenorrhea presents today for an acute visit.  She reports acute onset worsening right lower jaw pain, isolated to an area where her posterior most molar had broken off. She reports chronic pain since March 2013, but pain has acutely worsened for the last 2 days. She has tried hydrogen peroxide and over-the-counter benzocaine as numbing agent, with significant relief and swelling reduction, but still reports substantial pain, and she is unable to sleep at night. She does not note any discharge. She has tried Advil, Tylenol, and Aleve with minimal relief. She describes pain as her "nerve doing karate," specified as shooting pain down her jaw. Pain feels similar to when she had another molar extracted on her left side. At that [facility]  Regarding her history of low back pain, she notes intermittent numbness and tingling down her left leg that can be excruciating at times. She denies bladder or bowel incontinence. She tries to rest in a certain position to alleviate the pain, with minimal relief. No medications help this pain.   Past Medical History  Diagnosis Date  . Low back pain radiating to left leg 11/29/2010  . Dysmenorrhea 12/06/2009    Qualifier: Diagnosis of  By: Marylyn Ishihara RN, Gladys     Family History  Problem Relation Age of Onset  . Diabetes Mother   . Hypertension Father   . Heart attack Maternal Grandfather   . Hypertension Maternal Grandfather   . Heart attack Paternal Grandfather   . Hypertension Paternal Grandfather    History   Social History  . Marital Status: Single    Spouse Name: N/A    Number of Children: N/A  . Years of Education: N/A   Social History Main Topics  . Smoking status: Current Everyday Smoker -- 0.5 packs/day    Types: Cigarettes  .  Smokeless tobacco: None  . Alcohol Use: None  . Drug Use: None  . Sexually Active: None   Other Topics Concern  . None   Social History Narrative  . None   Review of Systems: General: no fevers, chills, changes in weight, changes in appetite Skin: no rash HEENT: no blurry vision, hearing changes, sore throat Pulm: no dyspnea, coughing, wheezing CV: no chest pain, palpitations, shortness of breath Abd: no abdominal pain, nausea/vomiting, diarrhea/constipation GU: no dysuria, hematuria, polyuria Ext: no arthralgias, myalgias Neuro: no weakness, numbness, or tingling   Objective:  Physical Exam: Filed Vitals:   12/06/11 1547  BP: 134/82  Pulse: 107  Temp: 98 F (36.7 C)  TempSrc: Oral  Weight: 149 lb 9.6 oz (67.858 kg)   Constitutional: Vital signs reviewed.  Patient is a well-developed and well-nourished woman in no acute distress and cooperative with exam.  Mouth: Poor dentition, no erythema or exudates, tenderness with palpation with q-tip to right lower jaw in the region of her last molar, which is now extracted, no palpable abscess or mass Eyes: PERRL, EOMI, conjunctivae normal, No scleral icterus.  Cardiovascular: regular, tachycardic, S1 normal, S2 normal, no MRG, pulses symmetric and intact bilaterally Pulmonary/Chest: CTAB, no wheezes, rales, or rhonchi Abdominal: Soft. Non-tender, non-distended, bowel sounds are normal, no masses, organomegaly, or guarding present.  Neurological: A&O x3, Strength is normal and symmetric bilaterally, cranial nerve II-XII are grossly intact (including normal sensation to  V3), no focal motor deficit, sensory intact to light touch bilaterally.  Skin: Warm, dry and intact. No rash, cyanosis, or clubbing.  Psychiatric: Normal mood and affect. speech and behavior is normal. Judgment and thought content normal. Cognition and memory are normal.   Assessment & Plan:  Case discussed with Dr. Rogelia Boga. Please see problem-oriented charting for  further details. Patient to return next month to follow up with her PCP for routine health maintenance.

## 2011-12-07 ENCOUNTER — Other Ambulatory Visit: Payer: Self-pay | Admitting: *Deleted

## 2011-12-07 MED ORDER — METRONIDAZOLE 500 MG PO TABS: 500.0000 mg | ORAL_TABLET | Freq: Three times a day (TID) | ORAL | Status: AC

## 2011-12-07 MED ORDER — AMOXICILLIN 500 MG PO CAPS: 500.0000 mg | ORAL_CAPSULE | Freq: Three times a day (TID) | ORAL | Status: AC

## 2011-12-07 NOTE — Telephone Encounter (Signed)
Neurotin Rx called into [facility].  The [facility] does not carry this med. Rx for Amoxicillin called into [facility]

## 2011-12-07 NOTE — Addendum Note (Signed)
Addended by: Belia Heman on: 12/07/2011 04:07 PM   Modules accepted: Orders

## 2012-01-12 ENCOUNTER — Encounter: Payer: Self-pay | Admitting: Internal Medicine

## 2012-01-15 ENCOUNTER — Encounter: Payer: Self-pay | Admitting: Internal Medicine

## 2012-01-15 ENCOUNTER — Ambulatory Visit (INDEPENDENT_AMBULATORY_CARE_PROVIDER_SITE_OTHER): Payer: Self-pay | Admitting: Internal Medicine

## 2012-01-15 VITALS — BP 102/66 | HR 85 | Temp 97.1°F | Ht 67.5 in | Wt 150.6 lb

## 2012-01-15 DIAGNOSIS — K089 Disorder of teeth and supporting structures, unspecified: Secondary | ICD-10-CM

## 2012-01-15 DIAGNOSIS — Z3042 Encounter for surveillance of injectable contraceptive: Secondary | ICD-10-CM | POA: Insufficient documentation

## 2012-01-15 DIAGNOSIS — K0889 Other specified disorders of teeth and supporting structures: Secondary | ICD-10-CM

## 2012-01-15 DIAGNOSIS — F17200 Nicotine dependence, unspecified, uncomplicated: Secondary | ICD-10-CM

## 2012-01-15 DIAGNOSIS — Z3049 Encounter for surveillance of other contraceptives: Secondary | ICD-10-CM

## 2012-01-15 DIAGNOSIS — Z124 Encounter for screening for malignant neoplasm of cervix: Secondary | ICD-10-CM

## 2012-01-15 DIAGNOSIS — N898 Other specified noninflammatory disorders of vagina: Secondary | ICD-10-CM

## 2012-01-15 DIAGNOSIS — Z720 Tobacco use: Secondary | ICD-10-CM

## 2012-01-15 LAB — POCT URINE PREGNANCY: Preg Test, Ur: NEGATIVE

## 2012-01-15 MED ORDER — MEDROXYPROGESTERONE ACETATE 150 MG/ML IM SUSP
150.0000 mg | INTRAMUSCULAR | Status: DC
  Administered 2012-01-15: 150 mg via INTRAMUSCULAR

## 2012-01-15 NOTE — Assessment & Plan Note (Addendum)
Will continue to counsel on smoking cessation. Again I discussed the risk of thromboembolic clots especially she is getting Depo shots and she verbalizes her understanding, states she will call 1-800 quit now again.  She tried chantix and gum but it did not work in the past.  She is unable to afford other meds because of financial issues and lack of insurance.

## 2012-01-15 NOTE — Assessment & Plan Note (Addendum)
I discussed the risks of thromboembolic events as well as death while on depo-provera and smoking; patient verbalized her understanding and wished to receive her Depo shot given today. I will refer patient to GYN for expert opinion in this case and will not continue her depo shot until she gets evaluated by GYN.  According to [facility], <1% of the patients will get DVT/PE as adverse effect while on Depo. I also discussed with attending, Dr. Kem Kays and we feel that the risks of depo-provera outweighs the benefits. Pap smear performed 01/15/12

## 2012-01-15 NOTE — Progress Notes (Addendum)
HPI: Ms. Andrea [name] is a 41 yo woman with PMH of left hip pain presents today for depo shot (last depo 10/11/11) and wants a pap smear.  No other complaints, she has been doing well. She is still smoking, 1 ppd because she is stressed out about school. She understands the risks of death/thromboembolic events while smoking and receiving depo shots but would like to get her shot today anyway.  She does not want flu shot today.  Last Pap smear was in 08/2009.   ROS: as per HPI  PE; General: alert, well-developed, and cooperative to examination.  Mouth: Poor dentition with cavities Lungs: normal respiratory effort, no accessory muscle use, normal breath sounds, no crackles, and no wheezes. Heart: normal rate, regular rhythm, no murmur, no gallop, and no rub.  Abdomen: soft, non-tender, normal bowel sounds, no distention, no guarding, no rebound tenderness Msk: no joint swelling, no joint warmth, and no redness over joints.  Pulses: 2+ DP/PT pulses bilaterally Extremities: No cyanosis, clubbing, edema Neurologic: alert & oriented X3, cranial nerves II-XII intact, strength normal in all extremities, sensation intact to light touch, and gait normal.  Skin: turgor normal and no rashes. GU: no adnexal masses noted, cervix looks normal. No mass in vagina tract.  Copious Thick cottage cheese discharge noted Psych: Oriented X3, memory intact for recent and remote, normally interactive, good eye contact, not anxious appearing, and not depressed appearing.

## 2012-01-15 NOTE — Addendum Note (Signed)
Addended by: Maura Crandall on: 01/15/2012 09:29 AM   Modules accepted: Orders

## 2012-01-15 NOTE — Addendum Note (Signed)
Addended by: Carrolyn Meiers T on: 01/15/2012 09:50 AM   Modules accepted: Orders

## 2012-01-15 NOTE — Patient Instructions (Addendum)
Follow up in 3 months

## 2012-01-15 NOTE — Assessment & Plan Note (Signed)
She has not seen a dentist due to lack of insurance.  Gave information on free dental clinic

## 2012-01-16 ENCOUNTER — Other Ambulatory Visit: Payer: Self-pay | Admitting: Internal Medicine

## 2012-01-16 MED ORDER — METRONIDAZOLE 500 MG PO TABS: 500.0000 mg | ORAL_TABLET | Freq: Two times a day (BID) | ORAL | Status: DC

## 2012-01-29 ENCOUNTER — Encounter: Payer: Self-pay | Admitting: Obstetrics & Gynecology

## 2012-02-23 ENCOUNTER — Encounter: Payer: Self-pay | Admitting: Obstetrics & Gynecology

## 2012-02-29 ENCOUNTER — Encounter (HOSPITAL_COMMUNITY): Payer: Self-pay | Admitting: *Deleted

## 2012-02-29 ENCOUNTER — Emergency Department (INDEPENDENT_AMBULATORY_CARE_PROVIDER_SITE_OTHER)
Admission: EM | Admit: 2012-02-29 | Discharge: 2012-02-29 | Disposition: A | Discharge status: Home / Self Care | Payer: Self-pay | Source: Home / Self Care | Attending: Emergency Medicine | Admitting: Emergency Medicine

## 2012-02-29 DIAGNOSIS — K047 Periapical abscess without sinus: Secondary | ICD-10-CM

## 2012-02-29 DIAGNOSIS — M79605 Pain in left leg: Secondary | ICD-10-CM

## 2012-02-29 DIAGNOSIS — M545 Low back pain: Secondary | ICD-10-CM

## 2012-02-29 DIAGNOSIS — K089 Disorder of teeth and supporting structures, unspecified: Secondary | ICD-10-CM

## 2012-02-29 DIAGNOSIS — K0889 Other specified disorders of teeth and supporting structures: Secondary | ICD-10-CM

## 2012-02-29 MED ORDER — GABAPENTIN 300 MG PO CAPS: ORAL_CAPSULE | ORAL | Status: DC

## 2012-02-29 MED ORDER — PENICILLIN V POTASSIUM 500 MG PO TABS: 500.0000 mg | ORAL_TABLET | Freq: Three times a day (TID) | ORAL | Status: AC

## 2012-02-29 MED ORDER — IBUPROFEN 800 MG PO TABS: 800.0000 mg | ORAL_TABLET | Freq: Three times a day (TID) | ORAL | Status: AC | PRN

## 2012-02-29 NOTE — ED Notes (Signed)
Swelling and abscess of wisdom tooth R lower jaw.  States she has a hole in it.  Had abscess in same tooth in August.  Has appointment 11/25 to get it pulled at [facility].

## 2012-02-29 NOTE — ED Provider Notes (Signed)
History     CSN: [id number]  Arrival date & time 02/29/12  1350   First MD Initiated Contact with Patient 02/29/12 1353      Chief Complaint  Patient presents with  . Dental Pain    (Consider location/radiation/quality/duration/timing/severity/associated sxs/prior treatment) HPI Comments: Swelling and abscess of wisdom tooth R lower jaw.  States she has a hole in it.  Had abscess in same tooth in August.  Has appointment 11/25 to get it pulled at [facility].  Patient is a 41 y.o. female presenting with tooth pain. The history is provided by the patient.  Dental PainThe primary symptoms include mouth pain. Primary symptoms do not include headaches, fever, angioedema or cough. The symptoms are unchanged. The symptoms are new. The symptoms occur constantly.  Additional symptoms include: dental sensitivity to temperature, gum swelling, gum tenderness and purulent gums. Additional symptoms do not include: ear pain, hearing loss, nosebleeds, swollen glands and goiter.    Past Medical History  Diagnosis Date  . Low back pain radiating to left leg 11/29/2010  . Dysmenorrhea 12/06/2009    Qualifier: Diagnosis of  By: Valentina Gu      Past Surgical History  Procedure Date  . Laproscopy surgery to check her tubes     Family History  Problem Relation Age of Onset  . Diabetes Mother   . Hypertension Mother   . Hypertension Father   . Heart attack Maternal Grandfather   . Hypertension Maternal Grandfather   . Heart attack Paternal Grandfather   . Hypertension Paternal Grandfather     History  Substance Use Topics  . Smoking status: Current Every Day Smoker -- 2.0 packs/day    Types: Cigarettes  . Smokeless tobacco: Not on file  . Alcohol Use: No    OB History    Grav Para Term Preterm Abortions TAB SAB Ect Mult Living                  Review of Systems  Constitutional: Negative for fever.  HENT: Negative for hearing loss, ear pain and nosebleeds.     Respiratory: Negative for cough.   Neurological: Negative for headaches.    Allergies  Hydrocodone  Home Medications   Current Outpatient Rx  Name  Route  Sig  Dispense  Refill  . MEDROXYPROGESTERONE ACETATE 150 MG/ML IM SUSP   Intramuscular   Inject 1 mL (150 mg total) into the muscle once.   1 mL   0   . GABAPENTIN 300 MG PO CAPS      Take 300mg  by mouth before bed.  On day 2, you may increase to 300mg  twice daily.  If needed, on day 3, you may increase to 300mg  three times daily.   30 capsule   0   . IBUPROFEN 800 MG PO TABS   Oral   Take 1 tablet (800 mg total) by mouth every 8 (eight) hours as needed for pain.   15 tablet   0   . METRONIDAZOLE 500 MG PO TABS   Oral   Take 1 tablet (500 mg total) by mouth 2 (two) times daily.   14 tablet   0   . PENICILLIN V POTASSIUM 500 MG PO TABS   Oral   Take 1 tablet (500 mg total) by mouth 3 (three) times daily.   15 tablet   0     BP 117/88  Pulse 73  Temp 98.6 F (37 C) (Oral)  Resp 16  SpO2 100%  Physical Exam  Nursing note and vitals reviewed. Constitutional: She appears well-developed and well-nourished.  HENT:  Head:    Mouth/Throat: No oropharyngeal exudate.    Eyes: Conjunctivae normal are normal.  Neck: Neck supple. No JVD present.  Abdominal: Soft.  Neurological: She is alert.  Skin: Skin is warm.    ED Course  Procedures (including critical care time)  Labs Reviewed - No data to display No results found.   1. Dental abscess   2. Pain, dental   3. Low back pain radiating to left leg       MDM  Early dental abscess- penicillin- motrin and refill for gabapantin        Jimmie Molly, MD 02/29/12 1555

## 2012-03-11 ENCOUNTER — Encounter: Payer: Self-pay | Admitting: Obstetrics & Gynecology

## 2012-04-10 NOTE — Addendum Note (Signed)
Addended by: Neomia Dear on: 04/10/2012 04:55 PM   Modules accepted: Orders

## 2012-05-22 ENCOUNTER — Telehealth: Payer: Self-pay | Admitting: *Deleted

## 2012-05-22 ENCOUNTER — Ambulatory Visit: Payer: No Typology Code available for payment source

## 2012-05-22 NOTE — Telephone Encounter (Signed)
Talked to Dr Anselm Jungling; stated pt needs to see GYN before giving Depo injection as discussed w/The Attending during last office visit. Called and informed pt.  Stated it might not be until next month until she can see GYN.

## 2012-05-22 NOTE — Telephone Encounter (Signed)
Call to pt. She is scheduled for Depo injection tomorrow - as per Dr Christie Nottingham note(01/15/12), pt needs to be eval by GYN before continuing these injections.  According to epic, pt has canceled appts x2 and I spoke to pt who stated [facility] is requiring $30..00 for this visit and she does not have the money. Please advise.  Thanks

## 2012-05-23 ENCOUNTER — Ambulatory Visit: Payer: No Typology Code available for payment source

## 2012-07-16 ENCOUNTER — Encounter: Payer: Self-pay | Admitting: Family Medicine

## 2012-07-16 ENCOUNTER — Ambulatory Visit (INDEPENDENT_AMBULATORY_CARE_PROVIDER_SITE_OTHER): Payer: No Typology Code available for payment source | Admitting: Family Medicine

## 2012-07-16 VITALS — BP 99/69 | HR 94 | Ht 68.0 in | Wt 150.0 lb

## 2012-07-16 DIAGNOSIS — M545 Low back pain: Secondary | ICD-10-CM

## 2012-07-16 DIAGNOSIS — M79605 Pain in left leg: Secondary | ICD-10-CM

## 2012-07-16 MED ORDER — TRAMADOL HCL 50 MG PO TABS: 50.0000 mg | ORAL_TABLET | Freq: Three times a day (TID) | ORAL | Status: DC | PRN

## 2012-07-16 MED ORDER — PREDNISONE (PAK) 10 MG PO TABS: ORAL_TABLET | ORAL | Status: DC

## 2012-07-16 MED ORDER — CYCLOBENZAPRINE HCL 10 MG PO TABS: 10.0000 mg | ORAL_TABLET | Freq: Three times a day (TID) | ORAL | Status: DC | PRN

## 2012-07-16 NOTE — Assessment & Plan Note (Signed)
Persistence of pain concerning for radiculopathy vs piriformis syndrome with sciatic nerve irritation.  MRI was done a year and a half ago showing L5-S1 lateral recess protrusion with anular tear at left S1 nerve root level but she did not go forward with injection at that time due to coverage not allowing for ESI within network but now it would allow for this.  Shown home exercises and stretches again.  Ice/heat prn, massage.  Trial prednisone then can switch to ibuprofen when finishes this.  Flexeril, tramadol as needed.  Call in 1-2 weeks if not improving - would then trial ESI at L5-S1 level.

## 2012-07-16 NOTE — Patient Instructions (Addendum)
Restart some of the home exercises - hold stretches for 20-30 seconds, repeat each one 2 or 3 times. Take tylenol for baseline pain relief (1-2 extra strength tabs 3x/day) Start prednisone dose pack. After finishing this you can take ibuprofen or aleve. Tramadol as needed for severe pain (no driving on this medicine). Flexeril as needed for muscle spasms (no driving on this medicine if it makes you sleepy). Call me in 1-2 weeks if you're not improving because we could set you up with a trial of a back injection. Stay as active as possible. Strengthening of low back muscles, abdominal musculature are key for long term pain relief. Otherwise follow-up with me in 1 month

## 2012-07-16 NOTE — Progress Notes (Signed)
Subjective:    Patient ID: Andrea [name], female    DOB: [birth year], 42 y.o.   MRN: [id number]  Back Pain    42 yo F here for f/u back pain.  09/16/10: Patient denies know injury. States about 3 months ago pain started lateral left hip and buttock and has worsened since that time. Went to urgent care and was told she had a bursitis Was given prednisone dose pack which did not help. Aleve was some help Went back to urgent care a second time and advised to follow up with an orthopedic surgeon but she didn't have the money for this appointment. Has tried heat and ice - heat seems to help her more. Pain worse with certain sitting positions and with prolonged standing. No numbness or tingling No back pain or groin pain. Pain goes from left buttock to about mid-thigh posteriorly. No prior back or left hip problems. No bowel or bladder dysfunction.  7/6: Patient reports pain has improved, down to a 5-6/10 from 9-10/10. Strength improving in physical therapy. Able to stand for much longer than before. Physical therapy, massage, home exercises helping. Still gets pain that starts in left buttock and goes into posterior thigh, occasionally past knee. No associated numbness or tingling. No back or groin pain. Has not tried tennis ball massage yet.  11/28/10: Patient still improved with pain now 5/10 but hasn't made much progress since last visit a month ago. Still doing home stretches and exercises and finished PT - they expressed in PT they expect her to be better than she is at this point with diagnosis of piriformis syndrome. Now taking aleve occasionally but using lidoderm patches and a liquid rub (that has capsicum in it) that also have helped with pain. Has radiation of pain from left buttock down back of left leg to about knee. Worse with prolonged sitting. No numbness/tingling. No groin pain.  07/16/12: Patient returns with worsening, persistent left sided leg and back  pain. States pain never went away since last visit with [place] 1 1/2 years ago. Did PT at that time with aleve, lidoderm, capsaicin. Pain still goes from left side low back and buttock down leg. Worse with prolonged sitting or prolonged standing. Difficulty putting weight on leg. No numbness/tingling. Recently started on gabapentin but not noticed a difference with this. No bowel/bladder dysfunction. No groin pain.  Past Medical History  Diagnosis Date  . Low back pain radiating to left leg 11/29/2010  . Dysmenorrhea 12/06/2009    Qualifier: Diagnosis of  By: Valentina Gu      Current Outpatient Prescriptions on File Prior to Visit  Medication Sig Dispense Refill  . gabapentin (NEURONTIN) 300 MG capsule Take 300mg  by mouth before bed.  On day 2, you may increase to 300mg  twice daily.  If needed, on day 3, you may increase to 300mg  three times daily.  30 capsule  0  . medroxyPROGESTERone (DEPO-PROVERA) 150 MG/ML injection Inject 1 mL (150 mg total) into the muscle once.  1 mL  0  . metroNIDAZOLE (FLAGYL) 500 MG tablet Take 1 tablet (500 mg total) by mouth 2 (two) times daily.  14 tablet  0   Current Facility-Administered Medications on File Prior to Visit  Medication Dose Route Frequency Provider Last Rate Last Dose  . medroxyPROGESTERone (DEPO-PROVERA) injection 150 mg  150 mg Intramuscular Q90 days Rocco Pauls, RN   150 mg at 10/11/11 1520    Past Surgical History  Procedure Laterality Date  . Laproscopy  surgery to check her tubes      Allergies  Allergen Reactions  . Hydrocodone     History   Social History  . Marital Status: Single    Spouse Name: N/A    Number of Children: N/A  . Years of Education: N/A   Occupational History  . Not on file.   Social History Main Topics  . Smoking status: Current Every Day Smoker -- 2.00 packs/day    Types: Cigarettes  . Smokeless tobacco: Not on file  . Alcohol Use: No  . Drug Use: 2.00 per week    Special: Marijuana   . Sexually Active: Not on file   Other Topics Concern  . Not on file   Social History Narrative  . No narrative on file    Family History  Problem Relation Age of Onset  . Diabetes Mother   . Hypertension Mother   . Hypertension Father   . Heart attack Maternal Grandfather   . Hypertension Maternal Grandfather   . Heart attack Paternal Grandfather   . Hypertension Paternal Grandfather     BP 99/69  Pulse 94  Ht 5\' 8"  (1.727 m)  Wt 150 lb (68.04 kg)  BMI 22.81 kg/m2  Review of Systems  Musculoskeletal: Positive for back pain.   See HPI above.    Objective:   Physical Exam  Gen: NAD - appears uncomfortable leaning to right side.  Back: No gross deformity, scoliosis. FROM without pain. TTP within left buttock at piriformis, mild left lower lumbar paraspinal TTP.  No midline or right TTP. Strength 5/5 BLEs except 4+/5 with left hip abduction, ext rotation.  Only hip abduction painful. MSRs 1+ bilateral patellar tendons, 2+ bilateral achilles and equal. NVI distally Sensation intact to light touch bilaterally + SLR on left, negative right.  L hip: No gross deformity. No focal TTP greater trochanter, anterior hip. FROM with negative logroll. Negative fabers.   + piriformis stretch on left reproducing stretch at area of pain.     Assessment & Plan:  1. Left hip/low back pain - Persistence of pain concerning for radiculopathy vs piriformis syndrome with sciatic nerve irritation.  MRI was done a year and a half ago showing L5-S1 lateral recess protrusion with anular tear at left S1 nerve root level but she did not go forward with injection at that time due to coverage not allowing for ESI within network but now it would allow for this.  Shown home exercises and stretches again.  Ice/heat prn, massage.  Trial prednisone then can switch to ibuprofen when finishes this.  Flexeril, tramadol as needed.  Call in 1-2 weeks if not improving - would then trial ESI at L5-S1  level.

## 2012-08-01 ENCOUNTER — Other Ambulatory Visit: Payer: Self-pay | Admitting: Family Medicine

## 2012-08-01 DIAGNOSIS — M545 Low back pain: Secondary | ICD-10-CM

## 2012-08-01 DIAGNOSIS — M79605 Pain in left leg: Secondary | ICD-10-CM

## 2012-08-06 ENCOUNTER — Ambulatory Visit
Admission: RE | Admit: 2012-08-06 | Discharge: 2012-08-06 | Disposition: A | Discharge status: Home / Self Care | Payer: No Typology Code available for payment source | Source: Ambulatory Visit | Attending: Family Medicine | Admitting: Family Medicine

## 2012-08-06 VITALS — BP 99/69 | HR 107

## 2012-08-06 DIAGNOSIS — M79605 Pain in left leg: Secondary | ICD-10-CM

## 2012-08-06 DIAGNOSIS — M545 Low back pain: Secondary | ICD-10-CM

## 2012-08-06 MED ORDER — IOHEXOL 180 MG/ML  SOLN
1.0000 mL | Freq: Once | INTRAMUSCULAR | Status: AC | PRN
  Administered 2012-08-06: 1 mL via EPIDURAL

## 2012-08-06 MED ORDER — METHYLPREDNISOLONE ACETATE 40 MG/ML INJ SUSP (RADIOLOG
120.0000 mg | Freq: Once | INTRAMUSCULAR | Status: AC
  Administered 2012-08-06: 120 mg via EPIDURAL

## 2012-08-12 ENCOUNTER — Encounter: Payer: Self-pay | Admitting: Family Medicine

## 2012-08-16 ENCOUNTER — Ambulatory Visit: Payer: No Typology Code available for payment source | Admitting: Family Medicine

## 2012-08-20 ENCOUNTER — Ambulatory Visit (INDEPENDENT_AMBULATORY_CARE_PROVIDER_SITE_OTHER): Payer: No Typology Code available for payment source | Admitting: Family Medicine

## 2012-08-20 ENCOUNTER — Encounter: Payer: Self-pay | Admitting: Family Medicine

## 2012-08-20 VITALS — BP 104/68 | HR 114 | Ht 68.0 in | Wt 150.0 lb

## 2012-08-20 DIAGNOSIS — M545 Low back pain: Secondary | ICD-10-CM

## 2012-08-20 MED ORDER — CYCLOBENZAPRINE HCL 10 MG PO TABS: 10.0000 mg | ORAL_TABLET | Freq: Three times a day (TID) | ORAL | Status: DC | PRN

## 2012-08-20 MED ORDER — TRAMADOL HCL 50 MG PO TABS: 50.0000 mg | ORAL_TABLET | Freq: Three times a day (TID) | ORAL | Status: DC | PRN

## 2012-08-21 ENCOUNTER — Encounter: Payer: Self-pay | Admitting: Family Medicine

## 2012-08-21 NOTE — Progress Notes (Signed)
Subjective:    Patient ID: Andrea [name], female    DOB: [birth year], 42 y.o.   MRN: [id number]  Back Pain    42 yo F here for f/u back pain.  09/16/10: Patient denies know injury. States about 3 months ago pain started lateral left hip and buttock and has worsened since that time. Went to urgent care and was told she had a bursitis Was given prednisone dose pack which did not help. Aleve was some help Went back to urgent care a second time and advised to follow up with an orthopedic surgeon but she didn't have the money for this appointment. Has tried heat and ice - heat seems to help her more. Pain worse with certain sitting positions and with prolonged standing. No numbness or tingling No back pain or groin pain. Pain goes from left buttock to about mid-thigh posteriorly. No prior back or left hip problems. No bowel or bladder dysfunction.  7/6: Patient reports pain has improved, down to a 5-6/10 from 9-10/10. Strength improving in physical therapy. Able to stand for much longer than before. Physical therapy, massage, home exercises helping. Still gets pain that starts in left buttock and goes into posterior thigh, occasionally past knee. No associated numbness or tingling. No back or groin pain. Has not tried tennis ball massage yet.  11/28/10: Patient still improved with pain now 5/10 but hasn't made much progress since last visit a month ago. Still doing home stretches and exercises and finished PT - they expressed in PT they expect her to be better than she is at this point with diagnosis of piriformis syndrome. Now taking aleve occasionally but using lidoderm patches and a liquid rub (that has capsicum in it) that also have helped with pain. Has radiation of pain from left buttock down back of left leg to about knee. Worse with prolonged sitting. No numbness/tingling. No groin pain.  07/16/12: Patient returns with worsening, persistent left sided leg and back  pain. States pain never went away since last visit with [place] 1 1/2 years ago. Did PT at that time with aleve, lidoderm, capsaicin. Pain still goes from left side low back and buttock down leg. Worse with prolonged sitting or prolonged standing. Difficulty putting weight on leg. No numbness/tingling. Recently started on gabapentin but not noticed a difference with this. No bowel/bladder dysfunction. No groin pain.  4/29: Patient reports she had an ESI on 4/10. She states this has helped with her pain - not as many sharp pains, much more tolerated. Taking tramadol, flexeril. Finished prednisone. She has an appointment to see a physician regarding temporary disability in a little over a month. No bowel/bladder dysfunction. No groin pain. No numbness/tingling.  Past Medical History  Diagnosis Date  . Low back pain radiating to left leg 11/29/2010  . Dysmenorrhea 12/06/2009    Qualifier: Diagnosis of  By: Valentina Gu      Current Outpatient Prescriptions on File Prior to Visit  Medication Sig Dispense Refill  . medroxyPROGESTERone (DEPO-PROVERA) 150 MG/ML injection Inject 1 mL (150 mg total) into the muscle once.  1 mL  0   Current Facility-Administered Medications on File Prior to Visit  Medication Dose Route Frequency Provider Last Rate Last Dose  . medroxyPROGESTERone (DEPO-PROVERA) injection 150 mg  150 mg Intramuscular Q90 days Rocco Pauls, RN   150 mg at 10/11/11 1520    Past Surgical History  Procedure Laterality Date  . Laproscopy surgery to check her tubes  Allergies  Allergen Reactions  . Hydrocodone Rash    History   Social History  . Marital Status: Single    Spouse Name: N/A    Number of Children: N/A  . Years of Education: N/A   Occupational History  . Not on file.   Social History Main Topics  . Smoking status: Current Every Day Smoker -- 2.00 packs/day    Types: Cigarettes  . Smokeless tobacco: Not on file  . Alcohol Use: No  . Drug  Use: 2.00 per week    Special: Marijuana  . Sexually Active: Not on file   Other Topics Concern  . Not on file   Social History Narrative  . No narrative on file    Family History  Problem Relation Age of Onset  . Diabetes Mother   . Hypertension Mother   . Hypertension Father   . Heart attack Maternal Grandfather   . Hypertension Maternal Grandfather   . Heart attack Paternal Grandfather   . Hypertension Paternal Grandfather     BP 104/68  Pulse 114  Ht 5\' 8"  (1.727 m)  Wt 150 lb (68.04 kg)  BMI 22.81 kg/m2  Review of Systems  Musculoskeletal: Positive for back pain.   See HPI above.    Objective:   Physical Exam  Gen: NAD - appears uncomfortable leaning to right side.  Back: No gross deformity, scoliosis. FROM without pain. No TTP currently left lumbar region, midline. Strength 5/5 BLEs MSRs 1+ bilateral patellar tendons, 2+ bilateral achilles and equal. NVI distally Sensation intact to light touch bilaterally Mild + SLR on left, negative right.  L hip: No gross deformity. No focal TTP greater trochanter, anterior hip. FROM with negative logroll. Negative fabers.       Assessment & Plan:  1. Left hip/low back pain - 2/2 lumbar radiculopathy.  She completed one ESI and this did help her - clinically helped quite a bit.  She wants to wait on a second injection - she feels better but not back to normal at this point.  Will continue with tramadol and flexeril.  Advised her to call me if she would like to be set up for a second ESI.  Continue HEP, stretches.  Ice/heat prn, massage.  F/u prn.

## 2012-08-21 NOTE — Assessment & Plan Note (Signed)
2/2 lumbar radiculopathy.  She completed one ESI and this did help her - clinically helped quite a bit.  She wants to wait on a second injection - she feels better but not back to normal at this point.  Will continue with tramadol and flexeril.  Advised her to call me if she would like to be set up for a second ESI.  Continue HEP, stretches.  Ice/heat prn, massage.  F/u prn.

## 2012-08-21 NOTE — Patient Instructions (Addendum)
Refills on your medicines were sent in. Call me if you would like to try a second injection and we can set this up. Otherwise follow up with me as needed.

## 2012-10-01 ENCOUNTER — Ambulatory Visit: Payer: Self-pay

## 2012-10-12 ENCOUNTER — Emergency Department (HOSPITAL_BASED_OUTPATIENT_CLINIC_OR_DEPARTMENT_OTHER)
Admission: EM | Admit: 2012-10-12 | Discharge: 2012-10-12 | Disposition: A | Discharge status: Home / Self Care | Payer: Self-pay | Attending: Emergency Medicine | Admitting: Emergency Medicine

## 2012-10-12 ENCOUNTER — Encounter (HOSPITAL_BASED_OUTPATIENT_CLINIC_OR_DEPARTMENT_OTHER): Payer: Self-pay | Admitting: *Deleted

## 2012-10-12 DIAGNOSIS — F17200 Nicotine dependence, unspecified, uncomplicated: Secondary | ICD-10-CM | POA: Insufficient documentation

## 2012-10-12 DIAGNOSIS — M545 Low back pain, unspecified: Secondary | ICD-10-CM | POA: Insufficient documentation

## 2012-10-12 DIAGNOSIS — M79605 Pain in left leg: Secondary | ICD-10-CM

## 2012-10-12 DIAGNOSIS — M549 Dorsalgia, unspecified: Secondary | ICD-10-CM

## 2012-10-12 DIAGNOSIS — Z8742 Personal history of other diseases of the female genital tract: Secondary | ICD-10-CM | POA: Insufficient documentation

## 2012-10-12 MED ORDER — DIAZEPAM 5 MG PO TABS: 5.0000 mg | ORAL_TABLET | Freq: Two times a day (BID) | ORAL | Status: DC

## 2012-10-12 MED ORDER — FENTANYL CITRATE 0.05 MG/ML IJ SOLN
75.0000 ug | Freq: Once | INTRAMUSCULAR | Status: AC
  Administered 2012-10-12: 75 ug via INTRAMUSCULAR
  Filled 2012-10-12: qty 2

## 2012-10-12 MED ORDER — TRAMADOL HCL 50 MG PO TABS: 50.0000 mg | ORAL_TABLET | Freq: Three times a day (TID) | ORAL | Status: DC | PRN

## 2012-10-12 MED ORDER — IBUPROFEN 800 MG PO TABS
800.0000 mg | ORAL_TABLET | Freq: Once | ORAL | Status: AC
  Administered 2012-10-12: 800 mg via ORAL
  Filled 2012-10-12: qty 1

## 2012-10-12 MED ORDER — DIAZEPAM 5 MG PO TABS
5.0000 mg | ORAL_TABLET | Freq: Once | ORAL | Status: AC
  Administered 2012-10-12: 5 mg via ORAL
  Filled 2012-10-12: qty 1

## 2012-10-12 NOTE — ED Provider Notes (Signed)
History     CSN: [id number]  Arrival date & time 10/12/12  0054   First MD Initiated Contact with Patient 10/12/12 0221      Chief Complaint  Patient presents with  . Back Pain   HPI Andrea [name] is a 42 y.o. female who has a past medical history significant for left sided sciatica. Patient says she's been treated in the past with injections and medications is currently out of tramadol. Denies any antecedent trauma. She's been out of her medications for 2 days and since then has noticed she's had an increase in pain above the iliac crest and the left lower lumbar paraspinous muscles that radiates to the foot. She denies any urinary incontinence, fecal incontinence, urinary retention, new numbness or tingling, she's had no weakness in the left leg. Denies saddle anesthesia. She had no fevers, no chills, no weight loss, no night sweats. She denies intravenous drug use.   Past Medical History  Diagnosis Date  . Low back pain radiating to left leg 11/29/2010  . Dysmenorrhea 12/06/2009    Qualifier: Diagnosis of  By: Valentina Gu      Past Surgical History  Procedure Laterality Date  . Laproscopy surgery to check her tubes      Family History  Problem Relation Age of Onset  . Diabetes Mother   . Hypertension Mother   . Hypertension Father   . Heart attack Maternal Grandfather   . Hypertension Maternal Grandfather   . Heart attack Paternal Grandfather   . Hypertension Paternal Grandfather     History  Substance Use Topics  . Smoking status: Current Every Day Smoker -- 2.00 packs/day    Types: Cigarettes  . Smokeless tobacco: Not on file  . Alcohol Use: No    OB History   Grav Para Term Preterm Abortions TAB SAB Ect Mult Living                  Review of Systems At least 10pt or greater review of systems completed and are negative except where specified in the HPI.  Allergies  Hydrocodone  Home Medications   Current Outpatient Rx  Name  Route  Sig  Dispense   Refill  . medroxyPROGESTERone (DEPO-PROVERA) 150 MG/ML injection   Intramuscular   Inject 1 mL (150 mg total) into the muscle once.   1 mL   0     BP 112/83  Pulse 101  Temp(Src) 98.1 F (36.7 C) (Oral)  Ht 5' 7.5" (1.715 m)  Wt 150 lb (68.04 kg)  BMI 23.13 kg/m2  SpO2 100%  Physical Exam  Nursing notes reviewed.  Electronic medical record reviewed. VITAL SIGNS:   Filed Vitals:   10/12/12 0104  BP: 112/83  Pulse: 101  Temp: 98.1 F (36.7 C)  TempSrc: Oral  Height: 5' 7.5" (1.715 m)  Weight: 150 lb (68.04 kg)  SpO2: 100%   CONSTITUTIONAL: Awake, oriented, appears non-toxic HENT: Atraumatic, normocephalic, oral mucosa pink and moist, airway patent. Nares patent without drainage. External ears normal. EYES: Conjunctiva clear, EOMI, PERRLA NECK: Trachea midline, non-tender, supple CARDIOVASCULAR: Normal heart rate, Normal rhythm, No murmurs, rubs, gallops PULMONARY/CHEST: Clear to auscultation, no rhonchi, wheezes, or rales. Symmetrical breath sounds. Non-tender. ABDOMINAL: Non-distended, soft, non-tender - no rebound or guarding.  BS normal. NEUROLOGIC: Non-focal, moving all four extremities, no gross sensory or motor deficits. No clonus. 1+ patellar reflexes bilaterally. Patient has a slightly antalgic gait favors the left side. EXTREMITIES: No clubbing, cyanosis, or edema SKIN:  Warm, Dry, No erythema, No rash  ED Course  Procedures (including critical care time)  Labs Reviewed - No data to display No results found.   1. Back pain   2. Low back pain radiating to left leg       MDM  Patient presents with exacerbation of sciatica - I do not think this patient has an epidural abscess, osteomyelitis, cauda equina syndrome or other cord compression syndrome.  Patient's symptoms have been stable, she's been out of her pain medicine for the last 2 days. Control patient's pain conservatively, refill given for tramadol, patient to follow up with primary care for chronic  pain management.  I explained the diagnosis and have given explicit precautions to return to the ER including numbness, tingling, weakness in the left leg, urinary retention, urinary or fecal incontinence, saddle anesthesia or any other new or worsening symptoms. The patient understands and accepts the medical plan as it's been dictated and I have answered their questions. Discharge instructions concerning home care and prescriptions have been given.  The patient is STABLE and is discharged to home in good condition.      Jones Skene, MD 10/12/12 1610

## 2012-10-12 NOTE — ED Notes (Signed)
Pt states that she has had back pain since 2012 reports that she has been seen by Dr Pearletha Forge initially and was then sent to Dr Madaline Guthrie. Pt reports that she has had left leg pain and tingling on toes. Denies loss of bowel or bladder

## 2012-10-17 ENCOUNTER — Ambulatory Visit (INDEPENDENT_AMBULATORY_CARE_PROVIDER_SITE_OTHER): Payer: No Typology Code available for payment source | Admitting: Family Medicine

## 2012-10-17 ENCOUNTER — Encounter: Payer: Self-pay | Admitting: Family Medicine

## 2012-10-17 VITALS — BP 134/87 | HR 91 | Ht 68.0 in | Wt 150.0 lb

## 2012-10-17 DIAGNOSIS — M545 Low back pain, unspecified: Secondary | ICD-10-CM

## 2012-10-17 MED ORDER — CYCLOBENZAPRINE HCL 10 MG PO TABS: 10.0000 mg | ORAL_TABLET | Freq: Three times a day (TID) | ORAL | Status: DC | PRN

## 2012-10-17 MED ORDER — PREDNISONE (PAK) 10 MG PO TABS: ORAL_TABLET | ORAL | Status: DC

## 2012-10-17 MED ORDER — NORTRIPTYLINE HCL 10 MG PO CAPS: 10.0000 mg | ORAL_CAPSULE | Freq: Every day | ORAL | Status: DC

## 2012-10-17 NOTE — Patient Instructions (Addendum)
Restart some of the home exercises - hold stretches for 20-30 seconds, repeat each one 2 or 3 times. Take tylenol for baseline pain relief (1-2 extra strength tabs 3x/day) Start prednisone dose pack. After finishing this you can take ibuprofen or aleve. Flexeril as needed for muscle spasms (no driving on this medicine if it makes you sleepy). Nortriptyline every night at bedtime for nerve pain if this isn't too expensive. Call me in 5-7 days if not improving and we could go ahead with setting up an injection. Stay as active as possible. Strengthening of low back muscles, abdominal musculature are key for long term pain relief. Otherwise follow-up with me in 1 month.

## 2012-10-21 ENCOUNTER — Encounter: Payer: Self-pay | Admitting: Family Medicine

## 2012-10-21 MED ORDER — NORTRIPTYLINE HCL 25 MG PO CAPS: 25.0000 mg | ORAL_CAPSULE | Freq: Every day | ORAL | Status: DC

## 2012-10-21 NOTE — Progress Notes (Signed)
Subjective:    Patient ID: Andrea [name], female    DOB: Dec 23, 1970, 42 y.o.   MRN: [id number]  Back Pain    42 yo F here for f/u back pain.  09/16/10: Patient denies know injury. States about 3 months ago pain started lateral left hip and buttock and has worsened since that time. Went to urgent care and was told she had a bursitis Was given prednisone dose pack which did not help. Aleve was some help Went back to urgent care a second time and advised to follow up with an orthopedic surgeon but she didn't have the money for this appointment. Has tried heat and ice - heat seems to help her more. Pain worse with certain sitting positions and with prolonged standing. No numbness or tingling No back pain or groin pain. Pain goes from left buttock to about mid-thigh posteriorly. No prior back or left hip problems. No bowel or bladder dysfunction.  7/6: Patient reports pain has improved, down to a 5-6/10 from 9-10/10. Strength improving in physical therapy. Able to stand for much longer than before. Physical therapy, massage, home exercises helping. Still gets pain that starts in left buttock and goes into posterior thigh, occasionally past knee. No associated numbness or tingling. No back or groin pain. Has not tried tennis ball massage yet.  11/28/10: Patient still improved with pain now 5/10 but hasn't made much progress since last visit a month ago. Still doing home stretches and exercises and finished PT - they expressed in PT they expect her to be better than she is at this point with diagnosis of piriformis syndrome. Now taking aleve occasionally but using lidoderm patches and a liquid rub (that has capsicum in it) that also have helped with pain. Has radiation of pain from left buttock down back of left leg to about knee. Worse with prolonged sitting. No numbness/tingling. No groin pain.  07/16/12: Patient returns with worsening, persistent left sided leg and back  pain. States pain never went away since last visit with [place] 1 1/2 years ago. Did PT at that time with aleve, lidoderm, capsaicin. Pain still goes from left side low back and buttock down leg. Worse with prolonged sitting or prolonged standing. Difficulty putting weight on leg. No numbness/tingling. Recently started on gabapentin but not noticed a difference with this. No bowel/bladder dysfunction. No groin pain.  4/29: Patient reports she had an ESI on 4/10. She states this has helped with her pain - not as many sharp pains, much more tolerated. Taking tramadol, flexeril. Finished prednisone. She has an appointment to see a physician regarding temporary disability in a little over a month. No bowel/bladder dysfunction. No groin pain. No numbness/tingling.  6/26: Patient returns as pain has worsened in her low back and radiates down left side into her calf. Given an IM injection in the ED without much benefit. Does home exercise program when she's not hurting. Tried tramadol and flexeril. Doing less walking at work as a result of the pain. No bowel/bladder dysfunction. Associated numbness in left leg.  Past Medical History  Diagnosis Date  . Low back pain radiating to left leg 11/29/2010  . Dysmenorrhea 12/06/2009    Qualifier: Diagnosis of  By: Valentina Gu      Current Outpatient Prescriptions on File Prior to Visit  Medication Sig Dispense Refill  . medroxyPROGESTERone (DEPO-PROVERA) 150 MG/ML injection Inject 1 mL (150 mg total) into the muscle once.  1 mL  0   Current Facility-Administered Medications on  File Prior to Visit  Medication Dose Route Frequency Provider Last Rate Last Dose  . medroxyPROGESTERone (DEPO-PROVERA) injection 150 mg  150 mg Intramuscular Q90 days Rocco Pauls, RN   150 mg at 10/11/11 1520    Past Surgical History  Procedure Laterality Date  . Laproscopy surgery to check her tubes      Allergies  Allergen Reactions  . Hydrocodone  Rash    History   Social History  . Marital Status: Single    Spouse Name: N/A    Number of Children: N/A  . Years of Education: N/A   Occupational History  . Not on file.   Social History Main Topics  . Smoking status: Current Every Day Smoker -- 1.00 packs/day    Types: Cigarettes  . Smokeless tobacco: Not on file  . Alcohol Use: No  . Drug Use: 2.00 per week    Special: Marijuana  . Sexually Active: Not on file   Other Topics Concern  . Not on file   Social History Narrative  . No narrative on file    Family History  Problem Relation Age of Onset  . Diabetes Mother   . Hypertension Mother   . Hypertension Father   . Heart attack Maternal Grandfather   . Hypertension Maternal Grandfather   . Heart attack Paternal Grandfather   . Hypertension Paternal Grandfather     BP 134/87  Pulse 91  Ht 5\' 8"  (1.727 m)  Wt 150 lb (68.04 kg)  BMI 22.81 kg/m2  Review of Systems  Musculoskeletal: Positive for back pain.   See HPI above.    Objective:   Physical Exam  Gen: NAD - appears uncomfortable leaning to right side.  Back: No gross deformity, scoliosis. Pain with flexion > extension.  Only 50 degrees flexion. TTP left paraspinal region and buttock.  No midline, right sided TTP. Strength 5/5 BLEs MSRs 1+ bilateral patellar tendons, 2+ bilateral achilles and equal. NVI distally Sensation intact to light touch bilaterally + SLR on left, negative right. Negative logrolls.    Assessment & Plan:  1. Left hip/low back pain - 2/2 lumbar radiculopathy.  First ESI helped quite a bit.  Wants to try medications first before pursuing a second one - given prednisone dose pack then transition to nsaid.  Try nortriptyline if she can afford this for neuropathic pain.  Restart some home exercises.  F/u in 1 month.  Call if still severe in 1 week and would set up a repeat ESI.

## 2012-10-21 NOTE — Assessment & Plan Note (Signed)
2/2 lumbar radiculopathy.  First ESI helped quite a bit.  Wants to try medications first before pursuing a second one - given prednisone dose pack then transition to nsaid.  Try nortriptyline if she can afford this for neuropathic pain.  Restart some home exercises.  F/u in 1 month.  Call if still severe in 1 week and would set up a repeat ESI.

## 2012-11-28 ENCOUNTER — Telehealth: Payer: Self-pay | Admitting: *Deleted

## 2012-11-29 ENCOUNTER — Telehealth: Payer: Self-pay | Admitting: *Deleted

## 2012-11-29 DIAGNOSIS — M549 Dorsalgia, unspecified: Secondary | ICD-10-CM

## 2012-12-02 MED ORDER — GABAPENTIN 300 MG PO CAPS: ORAL_CAPSULE | ORAL | Status: DC

## 2012-12-02 NOTE — Telephone Encounter (Signed)
See above

## 2012-12-04 ENCOUNTER — Other Ambulatory Visit: Payer: Self-pay | Admitting: Family Medicine

## 2012-12-04 DIAGNOSIS — M549 Dorsalgia, unspecified: Secondary | ICD-10-CM

## 2012-12-05 ENCOUNTER — Ambulatory Visit
Admission: RE | Admit: 2012-12-05 | Discharge: 2012-12-05 | Disposition: A | Discharge status: Home / Self Care | Payer: No Typology Code available for payment source | Source: Ambulatory Visit | Attending: Family Medicine | Admitting: Family Medicine

## 2012-12-05 ENCOUNTER — Other Ambulatory Visit: Payer: Self-pay | Admitting: Family Medicine

## 2012-12-05 VITALS — BP 91/59 | HR 64

## 2012-12-05 DIAGNOSIS — M79605 Pain in left leg: Secondary | ICD-10-CM

## 2012-12-05 DIAGNOSIS — M549 Dorsalgia, unspecified: Secondary | ICD-10-CM

## 2012-12-05 MED ORDER — IOHEXOL 180 MG/ML  SOLN
1.0000 mL | Freq: Once | INTRAMUSCULAR | Status: AC | PRN
  Administered 2012-12-05: 1 mL via INTRAVENOUS

## 2012-12-05 MED ORDER — METHYLPREDNISOLONE ACETATE 40 MG/ML INJ SUSP (RADIOLOG: 120.0000 mg | Freq: Once | INTRAMUSCULAR | Status: DC

## 2012-12-26 ENCOUNTER — Telehealth: Payer: Self-pay | Admitting: Family Medicine

## 2012-12-26 NOTE — Telephone Encounter (Signed)
Called patient back and told her what you had advised about the Neurontin to 600mg  three times a day.

## 2012-12-26 NOTE — Telephone Encounter (Signed)
Would increase Neurontin to 600mg  three times a day.  She could see a neurosurgeon but might be difficult with only the [facility] card.

## 2013-02-18 ENCOUNTER — Ambulatory Visit: Payer: Self-pay

## 2013-02-25 ENCOUNTER — Encounter: Payer: Self-pay | Admitting: Family Medicine

## 2013-02-25 ENCOUNTER — Encounter (HOSPITAL_BASED_OUTPATIENT_CLINIC_OR_DEPARTMENT_OTHER): Payer: Self-pay | Admitting: Emergency Medicine

## 2013-02-25 ENCOUNTER — Emergency Department (HOSPITAL_BASED_OUTPATIENT_CLINIC_OR_DEPARTMENT_OTHER)
Admission: EM | Admit: 2013-02-25 | Discharge: 2013-02-25 | Disposition: A | Discharge status: Home / Self Care | Payer: No Typology Code available for payment source | Attending: Emergency Medicine | Admitting: Emergency Medicine

## 2013-02-25 ENCOUNTER — Ambulatory Visit (INDEPENDENT_AMBULATORY_CARE_PROVIDER_SITE_OTHER): Payer: No Typology Code available for payment source | Admitting: Family Medicine

## 2013-02-25 VITALS — BP 114/82 | HR 111 | Ht 68.0 in | Wt 130.0 lb

## 2013-02-25 DIAGNOSIS — F17200 Nicotine dependence, unspecified, uncomplicated: Secondary | ICD-10-CM | POA: Insufficient documentation

## 2013-02-25 DIAGNOSIS — M545 Low back pain, unspecified: Secondary | ICD-10-CM | POA: Insufficient documentation

## 2013-02-25 DIAGNOSIS — Z791 Long term (current) use of non-steroidal anti-inflammatories (NSAID): Secondary | ICD-10-CM | POA: Insufficient documentation

## 2013-02-25 DIAGNOSIS — G8929 Other chronic pain: Secondary | ICD-10-CM | POA: Insufficient documentation

## 2013-02-25 DIAGNOSIS — G5702 Lesion of sciatic nerve, left lower limb: Secondary | ICD-10-CM

## 2013-02-25 DIAGNOSIS — IMO0002 Reserved for concepts with insufficient information to code with codable children: Secondary | ICD-10-CM | POA: Insufficient documentation

## 2013-02-25 DIAGNOSIS — Z8742 Personal history of other diseases of the female genital tract: Secondary | ICD-10-CM | POA: Insufficient documentation

## 2013-02-25 DIAGNOSIS — G5700 Lesion of sciatic nerve, unspecified lower limb: Secondary | ICD-10-CM

## 2013-02-25 MED ORDER — PREDNISONE (PAK) 10 MG PO TABS: ORAL_TABLET | ORAL | Status: DC

## 2013-02-25 MED ORDER — KETOROLAC TROMETHAMINE 60 MG/2ML IM SOLN
60.0000 mg | Freq: Once | INTRAMUSCULAR | Status: AC
  Administered 2013-02-25: 60 mg via INTRAMUSCULAR
  Filled 2013-02-25: qty 2

## 2013-02-25 MED ORDER — OXYCODONE-ACETAMINOPHEN 5-325 MG PO TABS: 1.0000 | ORAL_TABLET | Freq: Four times a day (QID) | ORAL | Status: DC | PRN

## 2013-02-25 MED ORDER — MELOXICAM 15 MG PO TABS
15.0000 mg | ORAL_TABLET | Freq: Every day | ORAL | Status: DC
Start: 2013-02-25 — End: 2013-04-20

## 2013-02-25 MED ORDER — CYCLOBENZAPRINE HCL 10 MG PO TABS: 10.0000 mg | ORAL_TABLET | Freq: Three times a day (TID) | ORAL | Status: DC | PRN

## 2013-02-25 NOTE — ED Provider Notes (Signed)
CSN: [id number]     Arrival date & time 02/25/13  1011 History   First MD Initiated Contact with Patient 02/25/13 1012     No chief complaint on file.  (Consider location/radiation/quality/duration/timing/severity/associated sxs/prior Treatment) Patient is a 42 y.o. female presenting with back pain.  Back Pain  Pt with history of chronic low back pain has had worsening pain over the last several weeks. Seen in Dr. Lazaro Arms office today for same and given Rx for pain meds, steroids and referred for epidural injections. Sent to the ED 'for a toradol injection' due to severe pain today. No change from typical pain. No fever or incontinence.   Past Medical History  Diagnosis Date  . Low back pain radiating to left leg 11/29/2010  . Dysmenorrhea 12/06/2009    Qualifier: Diagnosis of  By: Valentina Gu     Past Surgical History  Procedure Laterality Date  . Laproscopy surgery to check her tubes     Family History  Problem Relation Age of Onset  . Diabetes Mother   . Hypertension Mother   . Hypertension Father   . Heart attack Maternal Grandfather   . Hypertension Maternal Grandfather   . Heart attack Paternal Grandfather   . Hypertension Paternal Grandfather    History  Substance Use Topics  . Smoking status: Current Every Day Smoker    Types: Cigarettes  . Smokeless tobacco: Not on file     Comment: 1.5 to2 ppd  . Alcohol Use: No   OB History   Grav Para Term Preterm Abortions TAB SAB Ect Mult Living                 Review of Systems  Musculoskeletal: Positive for back pain.   All other systems reviewed and are negative except as noted in HPI.   Allergies  Hydrocodone  Home Medications   Current Outpatient Rx  Name  Route  Sig  Dispense  Refill  . cyclobenzaprine (FLEXERIL) 10 MG tablet   Oral   Take 1 tablet (10 mg total) by mouth every 8 (eight) hours as needed for muscle spasms.   90 tablet   1   . medroxyPROGESTERone (DEPO-PROVERA) 150 MG/ML injection  Intramuscular   Inject 1 mL (150 mg total) into the muscle once.   1 mL   0   . meloxicam (MOBIC) 15 MG tablet   Oral   Take 1 tablet (15 mg total) by mouth daily. With food.  Start after finishing prednisone.   30 tablet   1   . oxyCODONE-acetaminophen (ROXICET) 5-325 MG per tablet   Oral   Take 1 tablet by mouth every 6 (six) hours as needed for severe pain.   60 tablet   0   . predniSONE (STERAPRED UNI-PAK) 10 MG tablet      6 tabs po days 1-2, 5 tabs po days 3-4, 4 tabs po days 5-6, 3 tabs po days 7-8, 2 tabs po days 9-10, 1 tab po days 11-12   42 tablet   0    There were no vitals taken for this visit. Physical Exam  Nursing note and vitals reviewed. Constitutional: She is oriented to person, place, and time. She appears well-developed and well-nourished.  HENT:  Head: Normocephalic and atraumatic.  Eyes: EOM are normal. Pupils are equal, round, and reactive to light.  Neck: Normal range of motion. Neck supple.  Cardiovascular: Normal rate, normal heart sounds and intact distal pulses.   Pulmonary/Chest: Effort normal and breath  sounds normal.  Abdominal: Bowel sounds are normal. She exhibits no distension. There is no tenderness.  Musculoskeletal: Normal range of motion. She exhibits tenderness (diffuse soft tissue tenderness low back, particularly to very light touch). She exhibits no edema.  Neurological: She is alert and oriented to person, place, and time. She has normal strength. No cranial nerve deficit or sensory deficit.  Skin: Skin is warm and dry. No rash noted.  Psychiatric: She has a normal mood and affect.    ED Course  Procedures (including critical care time) Labs Review Labs Reviewed - No data to display Imaging Review No results found.  EKG Interpretation   None       MDM  No diagnosis found.  Toradol IM, continued care for chronic back pain per Dr.Hudnall.     Charles B. Bernette Mayers, MD 02/25/13 1025

## 2013-02-25 NOTE — ED Notes (Signed)
Pt reports severe back pain radiating to left leg.  She was seen by Dr. Pearletha Forge PTA, given pain RX and here for a pain shot.

## 2013-02-25 NOTE — Patient Instructions (Addendum)
Start prednisone dose pack. After finishing this take meloxicam 15 mg daily with food for pain and inflammation. Flexeril as needed for muscle spasms (no driving on this medicine if it makes you sleepy). Oxycodone as needed for severe pain - one time script, no driving on this. Apply for insurance to see if you can afford this with subsidies.   If this goes through we can send you to a neurosurgeon which I think is going to be necessary for you to get better.

## 2013-02-26 ENCOUNTER — Encounter: Payer: Self-pay | Admitting: Family Medicine

## 2013-02-26 NOTE — Assessment & Plan Note (Signed)
2/2 lumbar radiculopathy.  Has had 2 ESIs to date with only weeks of relief.  Discussed only thing at this point that would help her given conservative management has failed is neurosurgical referral, probable microdiscectomy.  Encouraged to apply for insurance through [facility] or come up with money to see neurosurgery.  Will give 12 day course of prednisone though this is third time we have done this - would not do again.  Will not repeat ESI.  One time script for percocet.  Flexeril as needed.

## 2013-02-26 NOTE — Progress Notes (Signed)
Patient ID: Andrea [name], female   DOB: Nov 06, 1970, 42 y.o.   MRN: [id number]  Subjective:    Patient ID: Andrea [name], female    DOB: 01-12-1971, 42 y.o.   MRN: [id number]  Back Pain    42 yo F here for f/u back pain.  09/16/10: Patient denies know injury. States about 3 months ago pain started lateral left hip and buttock and has worsened since that time. Went to urgent care and was told she had a bursitis Was given prednisone dose pack which did not help. Aleve was some help Went back to urgent care a second time and advised to follow up with an orthopedic surgeon but she didn't have the money for this appointment. Has tried heat and ice - heat seems to help her more. Pain worse with certain sitting positions and with prolonged standing. No numbness or tingling No back pain or groin pain. Pain goes from left buttock to about mid-thigh posteriorly. No prior back or left hip problems. No bowel or bladder dysfunction.  7/6: Patient reports pain has improved, down to a 5-6/10 from 9-10/10. Strength improving in physical therapy. Able to stand for much longer than before. Physical therapy, massage, home exercises helping. Still gets pain that starts in left buttock and goes into posterior thigh, occasionally past knee. No associated numbness or tingling. No back or groin pain. Has not tried tennis ball massage yet.  11/28/10: Patient still improved with pain now 5/10 but hasn't made much progress since last visit a month ago. Still doing home stretches and exercises and finished PT - they expressed in PT they expect her to be better than she is at this point with diagnosis of piriformis syndrome. Now taking aleve occasionally but using lidoderm patches and a liquid rub (that has capsicum in it) that also have helped with pain. Has radiation of pain from left buttock down back of left leg to about knee. Worse with prolonged sitting. No numbness/tingling. No groin  pain.  07/16/12: Patient returns with worsening, persistent left sided leg and back pain. States pain never went away since last visit with [place] 1 1/2 years ago. Did PT at that time with aleve, lidoderm, capsaicin. Pain still goes from left side low back and buttock down leg. Worse with prolonged sitting or prolonged standing. Difficulty putting weight on leg. No numbness/tingling. Recently started on gabapentin but not noticed a difference with this. No bowel/bladder dysfunction. No groin pain.  4/29: Patient reports she had an ESI on 4/10. She states this has helped with her pain - not as many sharp pains, much more tolerated. Taking tramadol, flexeril. Finished prednisone. She has an appointment to see a physician regarding temporary disability in a little over a month. No bowel/bladder dysfunction. No groin pain. No numbness/tingling.  6/26: Patient returns as pain has worsened in her low back and radiates down left side into her calf. Given an IM injection in the ED without much benefit. Does home exercise program when she's not hurting. Tried tramadol and flexeril. Doing less walking at work as a result of the pain. No bowel/bladder dysfunction. Associated numbness in left leg.  11/4: Patient returns with persistent pain in left side of low back into left leg and foot. Last ESI only lasted about 2 weeks. Neurontin, nortiptyline tried without relief. Only has cone coverage at this point. Has to bring money up front to see a neurosurgeon.  Past Medical History  Diagnosis Date  . Low back pain radiating to left  leg 11/29/2010  . Dysmenorrhea 12/06/2009    Qualifier: Diagnosis of  By: Valentina Gu      Current Outpatient Prescriptions on File Prior to Visit  Medication Sig Dispense Refill  . medroxyPROGESTERone (DEPO-PROVERA) 150 MG/ML injection Inject 1 mL (150 mg total) into the muscle once.  1 mL  0   Current Facility-Administered Medications on File Prior to  Visit  Medication Dose Route Frequency Provider Last Rate Last Dose  . medroxyPROGESTERone (DEPO-PROVERA) injection 150 mg  150 mg Intramuscular Q90 days Rocco Pauls, RN   150 mg at 10/11/11 1520    Past Surgical History  Procedure Laterality Date  . Laproscopy surgery to check her tubes      Allergies  Allergen Reactions  . Hydrocodone Rash    History   Social History  . Marital Status: Single    Spouse Name: N/A    Number of Children: N/A  . Years of Education: N/A   Occupational History  . Not on file.   Social History Main Topics  . Smoking status: Current Every Day Smoker -- 2.00 packs/day    Types: Cigarettes  . Smokeless tobacco: Not on file     Comment: 1.5 to2 ppd  . Alcohol Use: No  . Drug Use: 2.00 per week    Special: Marijuana  . Sexual Activity: Not on file   Other Topics Concern  . Not on file   Social History Narrative  . No narrative on file    Family History  Problem Relation Age of Onset  . Diabetes Mother   . Hypertension Mother   . Hypertension Father   . Heart attack Maternal Grandfather   . Hypertension Maternal Grandfather   . Heart attack Paternal Grandfather   . Hypertension Paternal Grandfather     BP 114/82  Pulse 111  Ht 5\' 8"  (1.727 m)  Wt 130 lb (58.968 kg)  BMI 19.77 kg/m2  LMP 01/25/2013  Review of Systems  Musculoskeletal: Positive for back pain.   See HPI above.    Objective:   Physical Exam Gen: NAD - appears uncomfortable leaning to right side.  Back: No gross deformity, scoliosis. Pain with flexion and extension.  Lots of difficulty with both motions. TTP left paraspinal region and buttock.  No midline, right sided TTP. Strength 5/5 BLEs except 4/5 left hip flexion.  Unable to test hip abduction MSRs 1+ bilateral patellar tendons, 2+ bilateral achilles and equal. NVI distally Sensation intact to light touch bilaterally + SLR on left, negative right. Negative logrolls.    Assessment & Plan:  1.  Left hip/low back pain - 2/2 lumbar radiculopathy.  Has had 2 ESIs to date with only weeks of relief.  Discussed only thing at this point that would help her given conservative management has failed is neurosurgical referral, probable microdiscectomy.  Encouraged to apply for insurance through [facility] or come up with money to see neurosurgery.  Will give 12 day course of prednisone though this is third time we have done this - would not do again.  Will not repeat ESI.  One time script for percocet.  Flexeril as needed.

## 2013-03-12 ENCOUNTER — Ambulatory Visit (INDEPENDENT_AMBULATORY_CARE_PROVIDER_SITE_OTHER): Payer: No Typology Code available for payment source | Admitting: Internal Medicine

## 2013-03-12 ENCOUNTER — Encounter: Payer: Self-pay | Admitting: Internal Medicine

## 2013-03-12 VITALS — BP 106/74 | HR 101 | Temp 96.7°F | Ht 66.0 in | Wt 144.2 lb

## 2013-03-12 DIAGNOSIS — Z23 Encounter for immunization: Secondary | ICD-10-CM

## 2013-03-12 DIAGNOSIS — M545 Low back pain: Secondary | ICD-10-CM

## 2013-03-12 DIAGNOSIS — N946 Dysmenorrhea, unspecified: Secondary | ICD-10-CM

## 2013-03-12 LAB — POCT URINE PREGNANCY: Preg Test, Ur: NEGATIVE

## 2013-03-12 LAB — CBC
Hemoglobin: 14.6 g/dL (ref 12.0–15.0)
MCH: 34.1 pg — ABNORMAL HIGH (ref 26.0–34.0)
MCV: 96 fL (ref 78.0–100.0)
Platelets: 327 10*3/uL (ref 150–400)
RBC: 4.28 MIL/uL (ref 3.87–5.11)
WBC: 5.4 10*3/uL (ref 4.0–10.5)

## 2013-03-12 MED ORDER — MEDROXYPROGESTERONE ACETATE 150 MG/ML IM SUSP
150.0000 mg | Freq: Once | INTRAMUSCULAR | Status: AC
  Administered 2013-03-12: 150 mg via INTRAMUSCULAR

## 2013-03-12 NOTE — Progress Notes (Signed)
Subjective:     Patient ID: Andrea [name], female   DOB: 1971-02-02, 42 y.o.   MRN: [id number]  HPI The patient is a 42 YO female who is coming in today for a follow up. She was recently seen at the ED for chronic back pain. She is seen at sports medicine who thinks that she needs neurosurgery. She does not have insurance at this time except the orange card. She also has history of tobacco abuse and dental pain. She is also having excessive bleeding and pain with diarrhea and nausea during her menses. She is having periods every 2-3 weeks. She was on depo-provera previously and she is not sure why it was stopped. She states that she was told to go to [facility] hospital but they were asking her to pay $30 and she was unable to afford that. No loss of bowel or bladder function. She is unable to describe the incident that started her back to hurting but states that she did sports and the events are fuzzy since that time. She is not sure what happened when but does not describe any precipitating factors.   Review of Systems  Constitutional: Positive for activity change. Negative for fever, chills, diaphoresis, appetite change, fatigue and unexpected weight change.  Respiratory: Negative for cough, chest tightness, shortness of breath and wheezing.   Cardiovascular: Negative for chest pain, palpitations and leg swelling.  Gastrointestinal: Positive for nausea and diarrhea. Negative for vomiting, abdominal pain and constipation.       During menses.  Genitourinary: Positive for vaginal bleeding and menstrual problem. Negative for dysuria, urgency, frequency, hematuria, flank pain, decreased urine volume, vaginal discharge, enuresis, difficulty urinating, genital sores, vaginal pain, pelvic pain and dyspareunia.  Musculoskeletal: Positive for back pain and gait problem. Negative for joint swelling, myalgias, neck pain and neck stiffness.  Skin: Negative.   Neurological: Negative for dizziness, tremors, seizures,  syncope, facial asymmetry, speech difficulty, weakness, light-headedness, numbness and headaches.       Objective:   Physical Exam  Constitutional: She is oriented to person, place, and time. She appears well-developed and well-nourished.  Some mild distress with sitting.  HENT:  Head: Normocephalic and atraumatic.  Eyes: EOM are normal. Pupils are equal, round, and reactive to light.  Neck: Normal range of motion. Neck supple.  Cardiovascular: Normal rate and regular rhythm.   No murmur heard. Pulmonary/Chest: Effort normal and breath sounds normal. No respiratory distress. She has no wheezes. She has no rales.  Abdominal: Soft. Bowel sounds are normal. She exhibits no distension. There is no tenderness. There is no rebound.  Musculoskeletal: She exhibits tenderness. She exhibits no edema.  Patient uncomfortable during our visit and stood due to discomfort with sitting.   Neurological: She is alert and oriented to person, place, and time. No cranial nerve deficit.  No loss of bowel or bladder function.  Skin: Skin is warm and dry.       Assessment/Plan:   1. Chronic back pain - Patient was advised to make an appointment to get on [facility] now that enrollment is open and was referred to [facility]. She will be referred to [facility] for neurosurgery as per sports medicine recommendations. She does have true findings on previous imaging studies.   2. Dysmenorrhea - Will check pregnancy test today (which was negative) and give depo-provera shot as she is having excessive symptoms and benefit seems to outweigh risk. Will check CBC and ferritin to see if she is iron deficient now that  she has been off of depo-provera shots for >1 year. Last Hg normal on depo-provera shots.   3. Disposition - She was offered flu shot and Tdap at today's visit which she declined. BP normal at today's visit and otherwise fairly healthy. Counseled her about smoking cessation but she did not want to try again at  this time due to stress from the holidays although she has cut back since previous visit 1 year ago and states only about 5 cigs per day.

## 2013-03-12 NOTE — Patient Instructions (Signed)
We will have you see Rudell Cobb to talk about insurance options. Until then we will try to get you to [facility] or [facility] for your back. We will give you a tetanus shot today and the depo-provera shot.   Come back in 3 months for your shot of depo-provera.   Call us with questions or problems at [phone].  Medroxyprogesterone injection [Contraceptive] What is this medicine? MEDROXYPROGESTERONE (me DROX ee proe JES te rone) contraceptive injections prevent pregnancy. They provide effective birth control for 3 months. Depo-subQ Provera 104 is also used for treating pain related to endometriosis. This medicine may be used for other purposes; ask your health care provider or pharmacist if you have questions. COMMON BRAND NAME(S): Depo-Provera, Depo-subQ Provera 104 What should I tell my health care provider before I take this medicine? They need to know if you have any of these conditions: -frequently drink alcohol -asthma -blood vessel disease or a history of a blood clot in the lungs or legs -bone disease such as osteoporosis -breast cancer -diabetes -eating disorder (anorexia nervosa or bulimia) -high blood pressure -HIV infection or AIDS -kidney disease -liver disease -mental depression -migraine -seizures (convulsions) -stroke -tobacco smoker -vaginal bleeding -an unusual or allergic reaction to medroxyprogesterone, other hormones, medicines, foods, dyes, or preservatives -pregnant or trying to get pregnant -breast-feeding How should I use this medicine? Depo-Provera Contraceptive injection is given into a muscle. Depo-subQ Provera 104 injection is given under the skin. These injections are given by a health care professional. You must not be pregnant before getting an injection. The injection is usually given during the first 5 days after the start of a menstrual period or 6 weeks after delivery of a baby. Talk to your pediatrician regarding the use of this medicine  in children. Special care may be needed. These injections have been used in female children who have started having menstrual periods. Overdosage: If you think you have taken too much of this medicine contact a poison control center or emergency room at once. NOTE: This medicine is only for you. Do not share this medicine with others. What if I miss a dose? Try not to miss a dose. You must get an injection once every 3 months to maintain birth control. If you cannot keep an appointment, call and reschedule it. If you wait longer than 13 weeks between Depo-Provera contraceptive injections or longer than 14 weeks between Depo-subQ Provera 104 injections, you could get pregnant. Use another method for birth control if you miss your appointment. You may also need a pregnancy test before receiving another injection. What may interact with this medicine? Do not take this medicine with any of the following medications: -bosentan This medicine may also interact with the following medications: -aminoglutethimide -antibiotics or medicines for infections, especially rifampin, rifabutin, rifapentine, and griseofulvin -aprepitant -barbiturate medicines such as phenobarbital or primidone -bexarotene -carbamazepine -medicines for seizures like ethotoin, felbamate, oxcarbazepine, phenytoin, topiramate -modafinil -St. John's wort This list may not describe all possible interactions. Give your health care provider a list of all the medicines, herbs, non-prescription drugs, or dietary supplements you use. Also tell them if you smoke, drink alcohol, or use illegal drugs. Some items may interact with your medicine. What should I watch for while using this medicine? This drug does not protect you against HIV infection (AIDS) or other sexually transmitted diseases. Use of this product may cause you to lose calcium from your bones. Loss of calcium may cause weak bones (osteoporosis). Only use this  product for more than  2 years if other forms of birth control are not right for you. The longer you use this product for birth control the more likely you will be at risk for weak bones. Ask your health care professional how you can keep strong bones. You may have a change in bleeding pattern or irregular periods. Many females stop having periods while taking this drug. If you have received your injections on time, your chance of being pregnant is very low. If you think you may be pregnant, see your health care professional as soon as possible. Tell your health care professional if you want to get pregnant within the next year. The effect of this medicine may last a long time after you get your last injection. What side effects may I notice from receiving this medicine? Side effects that you should report to your doctor or health care professional as soon as possible: -allergic reactions like skin rash, itching or hives, swelling of the face, lips, or tongue -breast tenderness or discharge -breathing problems -changes in vision -depression -feeling faint or lightheaded, falls -fever -pain in the abdomen, chest, groin, or leg -problems with balance, talking, walking -unusually weak or tired -yellowing of the eyes or skin Side effects that usually do not require medical attention (report to your doctor or health care professional if they continue or are bothersome): -acne -fluid retention and swelling -headache -irregular periods, spotting, or absent periods -temporary pain, itching, or skin reaction at site where injected -weight gain This list may not describe all possible side effects. Call your doctor for medical advice about side effects. You may report side effects to FDA at 1-800-FDA-1088. Where should I keep my medicine? This does not apply. The injection will be given to you by a health care professional. NOTE: This sheet is a summary. It may not cover all possible information. If you have questions  about this medicine, talk to your doctor, pharmacist, or health care provider.  2014, Elsevier/Gold Standard. (2008-05-01 18:37:56)

## 2013-03-13 LAB — FERRITIN: Ferritin: 105 ng/mL (ref 10–291)

## 2013-03-13 NOTE — Progress Notes (Signed)
Case discussed with Dr. Kollar soon after the resident saw the patient.  We reviewed the resident's history and exam and pertinent patient test results.  I agree with the assessment, diagnosis, and plan of care documented in the resident's note. 

## 2013-03-17 ENCOUNTER — Telehealth: Payer: Self-pay

## 2013-03-24 NOTE — Addendum Note (Signed)
Addended by: Neomia Dear on: 03/24/2013 06:12 PM   Modules accepted: Orders

## 2013-04-07 ENCOUNTER — Emergency Department (HOSPITAL_BASED_OUTPATIENT_CLINIC_OR_DEPARTMENT_OTHER)
Admission: EM | Admit: 2013-04-07 | Discharge: 2013-04-07 | Disposition: A | Discharge status: Home / Self Care | Payer: No Typology Code available for payment source | Attending: Emergency Medicine | Admitting: Emergency Medicine

## 2013-04-07 ENCOUNTER — Encounter (HOSPITAL_BASED_OUTPATIENT_CLINIC_OR_DEPARTMENT_OTHER): Payer: Self-pay | Admitting: Emergency Medicine

## 2013-04-07 DIAGNOSIS — L089 Local infection of the skin and subcutaneous tissue, unspecified: Secondary | ICD-10-CM

## 2013-04-07 DIAGNOSIS — G8929 Other chronic pain: Secondary | ICD-10-CM | POA: Insufficient documentation

## 2013-04-07 DIAGNOSIS — F17200 Nicotine dependence, unspecified, uncomplicated: Secondary | ICD-10-CM | POA: Insufficient documentation

## 2013-04-07 DIAGNOSIS — IMO0002 Reserved for concepts with insufficient information to code with codable children: Secondary | ICD-10-CM | POA: Insufficient documentation

## 2013-04-07 DIAGNOSIS — Z791 Long term (current) use of non-steroidal anti-inflammatories (NSAID): Secondary | ICD-10-CM | POA: Insufficient documentation

## 2013-04-07 DIAGNOSIS — Y939 Activity, unspecified: Secondary | ICD-10-CM | POA: Insufficient documentation

## 2013-04-07 DIAGNOSIS — Z8742 Personal history of other diseases of the female genital tract: Secondary | ICD-10-CM | POA: Insufficient documentation

## 2013-04-07 DIAGNOSIS — M549 Dorsalgia, unspecified: Secondary | ICD-10-CM | POA: Insufficient documentation

## 2013-04-07 DIAGNOSIS — S90426A Blister (nonthermal), unspecified lesser toe(s), initial encounter: Secondary | ICD-10-CM | POA: Insufficient documentation

## 2013-04-07 DIAGNOSIS — Y929 Unspecified place or not applicable: Secondary | ICD-10-CM | POA: Insufficient documentation

## 2013-04-07 MED ORDER — SULFAMETHOXAZOLE-TRIMETHOPRIM 800-160 MG PO TABS: 1.0000 | ORAL_TABLET | Freq: Two times a day (BID) | ORAL | Status: DC

## 2013-04-07 NOTE — ED Notes (Signed)
C/o infection to right 5th toe nail x 2-3 months

## 2013-04-07 NOTE — ED Provider Notes (Signed)
CSN: [id number]     Arrival date & time 04/07/13  1533 History   First MD Initiated Contact with Patient 04/07/13 1614     Chief Complaint  Patient presents with  . Toe Pain   (Consider location/radiation/quality/duration/timing/severity/associated sxs/prior Treatment) Patient is a 42 y.o. female presenting with toe pain. The history is provided by the patient.  Toe Pain This is a new problem. The current episode started more than 1 month ago. The problem occurs constantly. The problem has been gradually worsening. Pertinent negatives include no abdominal pain, chest pain, chills, fever, headaches, nausea, neck pain or vomiting.   Andrea [name] is a 42 y.o. female who presents to the ED with pain in the little toe of the right foot. The pain started about 3 months ago when she hit her toe and tore off her toe nail. The nail has come back but now there is tenderness and redness surrounding the nail. She has used peroxide to try and clean it. When she wears shoes the little toe rubs and now has a blister on the side. She has pain medication that she takes for her back pain.  Past Medical History  Diagnosis Date  . Low back pain radiating to left leg 11/29/2010  . Dysmenorrhea 12/06/2009    Qualifier: Diagnosis of  By: Valentina Gu     Past Surgical History  Procedure Laterality Date  . Laproscopy surgery to check her tubes     Family History  Problem Relation Age of Onset  . Diabetes Mother   . Hypertension Mother   . Hypertension Father   . Heart attack Maternal Grandfather   . Hypertension Maternal Grandfather   . Heart attack Paternal Grandfather   . Hypertension Paternal Grandfather    History  Substance Use Topics  . Smoking status: Current Every Day Smoker -- 0.50 packs/day    Types: Cigarettes  . Smokeless tobacco: Not on file     Comment: 1.5 to2 ppd  . Alcohol Use: No   OB History   Grav Para Term Preterm Abortions TAB SAB Ect Mult Living                 Review  of Systems  Constitutional: Negative for fever and chills.  HENT: Negative.   Respiratory: Negative for shortness of breath.   Cardiovascular: Negative for chest pain.  Gastrointestinal: Negative for nausea, vomiting and abdominal pain.  Musculoskeletal: Positive for back pain (chronic). Negative for neck pain.       Right little toe pain.  Skin: Positive for wound.  Neurological: Negative for light-headedness and headaches.  Psychiatric/Behavioral: The patient is not nervous/anxious.     Allergies  Hydrocodone  Home Medications   Current Outpatient Rx  Name  Route  Sig  Dispense  Refill  . cyclobenzaprine (FLEXERIL) 10 MG tablet   Oral   Take 1 tablet (10 mg total) by mouth every 8 (eight) hours as needed for muscle spasms.   90 tablet   1   . medroxyPROGESTERone (DEPO-PROVERA) 150 MG/ML injection   Intramuscular   Inject 1 mL (150 mg total) into the muscle once.   1 mL   0   . meloxicam (MOBIC) 15 MG tablet   Oral   Take 1 tablet (15 mg total) by mouth daily. With food.  Start after finishing prednisone.   30 tablet   1   . oxyCODONE-acetaminophen (ROXICET) 5-325 MG per tablet   Oral   Take 1 tablet by mouth  every 6 (six) hours as needed for severe pain.   60 tablet   0   . predniSONE (STERAPRED UNI-PAK) 10 MG tablet      6 tabs po days 1-2, 5 tabs po days 3-4, 4 tabs po days 5-6, 3 tabs po days 7-8, 2 tabs po days 9-10, 1 tab po days 11-12   42 tablet   0    BP 114/75  Pulse 102  Resp 16  Ht 5\' 6"  (1.676 m)  Wt 140 lb (63.504 kg)  BMI 22.61 kg/m2  SpO2 100% Physical Exam  Nursing note and vitals reviewed. Constitutional: She is oriented to person, place, and time. She appears well-developed and well-nourished.  HENT:  Head: Normocephalic and atraumatic.  Eyes: EOM are normal.  Neck: Neck supple.  Cardiovascular: Normal rate.   Pulmonary/Chest: Effort normal.  Musculoskeletal: Normal range of motion.       Right foot: She exhibits tenderness and  swelling. She exhibits normal range of motion, normal capillary refill and no deformity.       Feet:  Blister area noted to the lateral aspect of the right fifth toe. There is mild erythema noted.   Neurological: She is alert and oriented to person, place, and time. No cranial nerve deficit.  Skin: Skin is warm and dry.  Psychiatric: She has a normal mood and affect. Her behavior is normal.    ED Course  Procedures   MDM  42 y.o. female with infected blister area to the right fifth toe. Will treat with antibiotics and give referral. She is stable for discharge without any further screening at this time. Discussed with the patient and all questioned fully answered. She will return if any problems arise.    Medication List    TAKE these medications       sulfamethoxazole-trimethoprim 800-160 MG per tablet  Commonly known as:  SEPTRA DS  Take 1 tablet by mouth every 12 (twelve) hours.      ASK your doctor about these medications       cyclobenzaprine 10 MG tablet  Commonly known as:  FLEXERIL  Take 1 tablet (10 mg total) by mouth every 8 (eight) hours as needed for muscle spasms.     medroxyPROGESTERone 150 MG/ML injection  Commonly known as:  DEPO-PROVERA  Inject 1 mL (150 mg total) into the muscle once.     meloxicam 15 MG tablet  Commonly known as:  MOBIC  Take 1 tablet (15 mg total) by mouth daily. With food.  Start after finishing prednisone.     oxyCODONE-acetaminophen 5-325 MG per tablet  Commonly known as:  ROXICET  Take 1 tablet by mouth every 6 (six) hours as needed for severe pain.     predniSONE 10 MG tablet  Commonly known as:  STERAPRED UNI-PAK  6 tabs po days 1-2, 5 tabs po days 3-4, 4 tabs po days 5-6, 3 tabs po days 7-8, 2 tabs po days 9-10, 1 tab po days 11-12         [facility], NP 04/08/13 2055

## 2013-04-07 NOTE — ED Notes (Addendum)
Lrg female post op shoe applied to Pts Lt foot

## 2013-04-09 NOTE — ED Provider Notes (Signed)
Medical screening examination/treatment/procedure(s) were performed by non-physician practitioner and as supervising physician I was immediately available for consultation/collaboration.  EKG Interpretation   None        Doug Sou, MD 04/09/13 [phone]

## 2013-04-20 ENCOUNTER — Encounter (HOSPITAL_BASED_OUTPATIENT_CLINIC_OR_DEPARTMENT_OTHER): Payer: Self-pay | Admitting: Emergency Medicine

## 2013-04-20 ENCOUNTER — Emergency Department (HOSPITAL_BASED_OUTPATIENT_CLINIC_OR_DEPARTMENT_OTHER)
Admission: EM | Admit: 2013-04-20 | Discharge: 2013-04-20 | Disposition: A | Discharge status: Home / Self Care | Payer: No Typology Code available for payment source | Attending: Emergency Medicine | Admitting: Emergency Medicine

## 2013-04-20 DIAGNOSIS — R112 Nausea with vomiting, unspecified: Secondary | ICD-10-CM

## 2013-04-20 DIAGNOSIS — M549 Dorsalgia, unspecified: Secondary | ICD-10-CM | POA: Insufficient documentation

## 2013-04-20 DIAGNOSIS — R1013 Epigastric pain: Secondary | ICD-10-CM | POA: Insufficient documentation

## 2013-04-20 DIAGNOSIS — R197 Diarrhea, unspecified: Secondary | ICD-10-CM | POA: Insufficient documentation

## 2013-04-20 DIAGNOSIS — Z791 Long term (current) use of non-steroidal anti-inflammatories (NSAID): Secondary | ICD-10-CM | POA: Insufficient documentation

## 2013-04-20 DIAGNOSIS — M25559 Pain in unspecified hip: Secondary | ICD-10-CM | POA: Insufficient documentation

## 2013-04-20 DIAGNOSIS — Z8742 Personal history of other diseases of the female genital tract: Secondary | ICD-10-CM | POA: Insufficient documentation

## 2013-04-20 DIAGNOSIS — IMO0002 Reserved for concepts with insufficient information to code with codable children: Secondary | ICD-10-CM | POA: Insufficient documentation

## 2013-04-20 DIAGNOSIS — R6883 Chills (without fever): Secondary | ICD-10-CM | POA: Insufficient documentation

## 2013-04-20 DIAGNOSIS — F17200 Nicotine dependence, unspecified, uncomplicated: Secondary | ICD-10-CM | POA: Insufficient documentation

## 2013-04-20 DIAGNOSIS — G8929 Other chronic pain: Secondary | ICD-10-CM | POA: Insufficient documentation

## 2013-04-20 HISTORY — DX: Dorsalgia, unspecified: M54.9

## 2013-04-20 HISTORY — DX: Other chronic pain: G89.29

## 2013-04-20 LAB — URINALYSIS, ROUTINE W REFLEX MICROSCOPIC
Ketones, ur: >80 mg/dL — AB
Leukocytes, UA: NEGATIVE
Nitrite: NEGATIVE
Protein, ur: 100 mg/dL — AB

## 2013-04-20 LAB — CBC WITH DIFFERENTIAL/PLATELET
HCT: 44.2 % (ref 36.0–46.0)
Hemoglobin: 15.6 g/dL — ABNORMAL HIGH (ref 12.0–15.0)
Lymphs Abs: 1.2 10*3/uL (ref 0.7–4.0)
MCH: 34 pg (ref 26.0–34.0)
Monocytes Relative: 6 % (ref 3–12)
Neutro Abs: 7.3 10*3/uL (ref 1.7–7.7)
Neutrophils Relative %: 81 % — ABNORMAL HIGH (ref 43–77)
RBC: 4.59 MIL/uL (ref 3.87–5.11)

## 2013-04-20 LAB — RAPID URINE DRUG SCREEN, HOSP PERFORMED
Barbiturates: NOT DETECTED
Benzodiazepines: NOT DETECTED

## 2013-04-20 LAB — COMPREHENSIVE METABOLIC PANEL
Alkaline Phosphatase: 74 U/L (ref 39–117)
BUN: 11 mg/dL (ref 6–23)
Chloride: 102 mEq/L (ref 96–112)
GFR calc Af Amer: 80 mL/min — ABNORMAL LOW (ref >90)
GFR calc non Af Amer: 69 mL/min — ABNORMAL LOW (ref >90)
Glucose, Bld: 168 mg/dL — ABNORMAL HIGH (ref 70–99)
Potassium: 3.5 mEq/L (ref 3.5–5.1)
Total Bilirubin: 0.5 mg/dL (ref 0.3–1.2)

## 2013-04-20 LAB — URINE MICROSCOPIC-ADD ON

## 2013-04-20 MED ORDER — PROMETHAZINE HCL 25 MG/ML IJ SOLN
12.5000 mg | Freq: Once | INTRAMUSCULAR | Status: AC
  Administered 2013-04-20: 12.5 mg via INTRAVENOUS
  Filled 2013-04-20: qty 1

## 2013-04-20 MED ORDER — OXYCODONE-ACETAMINOPHEN 5-325 MG PO TABS
1.0000 | ORAL_TABLET | Freq: Once | ORAL | Status: AC
  Administered 2013-04-20: 1 via ORAL
  Filled 2013-04-20: qty 1

## 2013-04-20 MED ORDER — ONDANSETRON HCL 4 MG PO TABS: 4.0000 mg | ORAL_TABLET | Freq: Four times a day (QID) | ORAL | Status: DC

## 2013-04-20 MED ORDER — ONDANSETRON HCL 4 MG/2ML IJ SOLN
4.0000 mg | Freq: Once | INTRAMUSCULAR | Status: AC
  Administered 2013-04-20: 4 mg via INTRAVENOUS
  Filled 2013-04-20: qty 2

## 2013-04-20 MED ORDER — FAMOTIDINE 20 MG PO TABS
20.0000 mg | ORAL_TABLET | Freq: Once | ORAL | Status: AC
  Administered 2013-04-20: 20 mg via ORAL
  Filled 2013-04-20: qty 1

## 2013-04-20 MED ORDER — SODIUM CHLORIDE 0.9 % IV BOLUS (SEPSIS)
1000.0000 mL | Freq: Once | INTRAVENOUS | Status: AC
  Administered 2013-04-20: 1000 mL via INTRAVENOUS

## 2013-04-20 MED ORDER — CYCLOBENZAPRINE HCL 10 MG PO TABS: 10.0000 mg | ORAL_TABLET | Freq: Two times a day (BID) | ORAL | Status: DC | PRN

## 2013-04-20 NOTE — ED Notes (Signed)
Patient started vomiting around 2am, no relief

## 2013-04-20 NOTE — ED Provider Notes (Signed)
CSN: [id number]     Arrival date & time 04/20/13  1236 History   First MD Initiated Contact with Patient 04/20/13 1329     Chief Complaint  Patient presents with  . Emesis   (Consider location/radiation/quality/duration/timing/severity/associated sxs/prior Treatment) Patient is a 42 y.o. female presenting with vomiting. The history is provided by the patient.  Emesis Severity:  Severe Duration:  12 hours Timing:  Intermittent Number of daily episodes:  8 to 10 times Progression:  Unchanged Chronicity:  New Recent urination:  Normal Relieved by:  None tried Associated symptoms: chills and diarrhea   Associated symptoms: no headaches  Abdominal pain: cramping.   Risk factors: suspect food intake   Risk factors: no alcohol use and no diabetes    Andrea [name] is a 42 y.o. female who presents to the ED with nausea, vomiting and diarrhea that started at 2 am. She states that she bought a salad last night and ate it about 10 pm. She woke at 2 am with the vomiting and diarrhea. The emesis started as undigested food and then the last few times has just been yellow liquid.  She has watery brown stools every time she vomits. She was having severe cramping to start with but that has gotten better.  Past Medical History  Diagnosis Date  . Low back pain radiating to left leg 11/29/2010  . Dysmenorrhea 12/06/2009    Qualifier: Diagnosis of  By: Marylyn Ishihara RN, Venita Sheffield    . Chronic back pain    Past Surgical History  Procedure Laterality Date  . Laproscopy surgery to check her tubes     Family History  Problem Relation Age of Onset  . Diabetes Mother   . Hypertension Mother   . Hypertension Father   . Heart attack Maternal Grandfather   . Hypertension Maternal Grandfather   . Heart attack Paternal Grandfather   . Hypertension Paternal Grandfather    History  Substance Use Topics  . Smoking status: Current Every Day Smoker -- 0.50 packs/day    Types: Cigarettes  . Smokeless tobacco: Not on  file     Comment: 1.5 to2 ppd  . Alcohol Use: No   OB History   Grav Para Term Preterm Abortions TAB SAB Ect Mult Living                 Review of Systems  Constitutional: Positive for chills. Negative for fever.  HENT: Negative.   Eyes: Negative for discharge, redness and itching.  Respiratory: Negative for cough and shortness of breath.   Cardiovascular: Negative for chest pain.  Gastrointestinal: Positive for vomiting and diarrhea. Abdominal pain: cramping.  Genitourinary: Negative for dysuria, frequency and decreased urine volume.  Musculoskeletal: Positive for back pain (chronic ). Negative for neck pain.  Neurological: Negative for syncope and headaches.  Psychiatric/Behavioral: Negative for confusion. The patient is not nervous/anxious.     Allergies  Hydrocodone  Home Medications   Current Outpatient Rx  Name  Route  Sig  Dispense  Refill  . cyclobenzaprine (FLEXERIL) 10 MG tablet   Oral   Take 1 tablet (10 mg total) by mouth every 8 (eight) hours as needed for muscle spasms.   90 tablet   1   . medroxyPROGESTERone (DEPO-PROVERA) 150 MG/ML injection   Intramuscular   Inject 1 mL (150 mg total) into the muscle once.   1 mL   0   . meloxicam (MOBIC) 15 MG tablet   Oral   Take 1 tablet (15 mg  total) by mouth daily. With food.  Start after finishing prednisone.   30 tablet   1   . oxyCODONE-acetaminophen (ROXICET) 5-325 MG per tablet   Oral   Take 1 tablet by mouth every 6 (six) hours as needed for severe pain.   60 tablet   0   . predniSONE (STERAPRED UNI-PAK) 10 MG tablet      6 tabs po days 1-2, 5 tabs po days 3-4, 4 tabs po days 5-6, 3 tabs po days 7-8, 2 tabs po days 9-10, 1 tab po days 11-12   42 tablet   0   . sulfamethoxazole-trimethoprim (SEPTRA DS) 800-160 MG per tablet   Oral   Take 1 tablet by mouth every 12 (twelve) hours.   14 tablet   0    BP 102/59  Pulse 96  Temp(Src) 97.9 F (36.6 C) (Oral)  Resp 20  Ht 5' 6.5" (1.689 m)   Wt 140 lb (63.504 kg)  BMI 22.26 kg/m2  SpO2 100% Physical Exam  Nursing note and vitals reviewed. Constitutional: She is oriented to person, place, and time. She appears well-developed and well-nourished. No distress.  HENT:  Head: Normocephalic and atraumatic.  Right Ear: Tympanic membrane and external ear normal.  Left Ear: Tympanic membrane and external ear normal.  Nose: Nose normal.  Mouth/Throat: Uvula is midline, oropharynx is clear and moist and mucous membranes are normal.  Eyes: Conjunctivae and EOM are normal.  Neck: Neck supple.  Cardiovascular: Normal rate, regular rhythm and normal heart sounds.   Pulmonary/Chest: Effort normal and breath sounds normal.  Abdominal: Soft. There is tenderness in the epigastric area. There is no rebound, no guarding and no CVA tenderness.  Musculoskeletal: Normal range of motion.  Neurological: She is alert and oriented to person, place, and time. No cranial nerve deficit.  Skin: Skin is warm and dry.  Psychiatric: She has a normal mood and affect. Her behavior is normal.   Results for orders placed during the hospital encounter of 04/20/13 (from the past 24 hour(s))  CBC WITH DIFFERENTIAL     Status: Abnormal   Collection Time    04/20/13  1:20 PM      Result Value Range   WBC 8.9  4.0 - 10.5 K/uL   RBC 4.59  3.87 - 5.11 MIL/uL   Hemoglobin 15.6 (*) 12.0 - 15.0 g/dL   HCT 13.0  86.5 - 78.4 %   MCV 96.3  78.0 - 100.0 fL   MCH 34.0  26.0 - 34.0 pg   MCHC 35.3  30.0 - 36.0 g/dL   RDW 69.6  29.5 - 28.4 %   Platelets 308  150 - 400 K/uL   Neutrophils Relative % 81 (*) 43 - 77 %   Neutro Abs 7.3  1.7 - 7.7 K/uL   Lymphocytes Relative 13  12 - 46 %   Lymphs Abs 1.2  0.7 - 4.0 K/uL   Monocytes Relative 6  3 - 12 %   Monocytes Absolute 0.5  0.1 - 1.0 K/uL   Eosinophils Relative 0  0 - 5 %   Eosinophils Absolute 0.0  0.0 - 0.7 K/uL   Basophils Relative 0  0 - 1 %   Basophils Absolute 0.0  0.0 - 0.1 K/uL  COMPREHENSIVE METABOLIC PANEL      Status: Abnormal   Collection Time    04/20/13  1:20 PM      Result Value Range   Sodium 142  135 - 145 mEq/L  Potassium 3.5  3.5 - 5.1 mEq/L   Chloride 102  96 - 112 mEq/L   CO2 19  19 - 32 mEq/L   Glucose, Bld 168 (*) 70 - 99 mg/dL   BUN 11  6 - 23 mg/dL   Creatinine, Ser 1.61  0.50 - 1.10 mg/dL   Calcium 09.6  8.4 - 04.5 mg/dL   Total Protein 8.6 (*) 6.0 - 8.3 g/dL   Albumin 4.6  3.5 - 5.2 g/dL   AST 17  0 - 37 U/L   ALT 14  0 - 35 U/L   Alkaline Phosphatase 74  39 - 117 U/L   Total Bilirubin 0.5  0.3 - 1.2 mg/dL   GFR calc non Af Amer 69 (*) >90 mL/min   GFR calc Af Amer 80 (*) >90 mL/min  LIPASE, BLOOD     Status: None   Collection Time    04/20/13  1:20 PM      Result Value Range   Lipase 14  11 - 59 U/L  URINALYSIS, ROUTINE W REFLEX MICROSCOPIC     Status: Abnormal   Collection Time    04/20/13  2:12 PM      Result Value Range   Color, Urine AMBER (*) YELLOW   APPearance CLOUDY (*) CLEAR   Specific Gravity, Urine 1.031 (*) 1.005 - 1.030   pH 6.0  5.0 - 8.0   Glucose, UA NEGATIVE  NEGATIVE mg/dL   Hgb urine dipstick LARGE (*) NEGATIVE   Bilirubin Urine SMALL (*) NEGATIVE   Ketones, ur >80 (*) NEGATIVE mg/dL   Protein, ur 409 (*) NEGATIVE mg/dL   Urobilinogen, UA 1.0  0.0 - 1.0 mg/dL   Nitrite NEGATIVE  NEGATIVE   Leukocytes, UA NEGATIVE  NEGATIVE  URINE RAPID DRUG SCREEN (HOSP PERFORMED)     Status: Abnormal   Collection Time    04/20/13  2:12 PM      Result Value Range   Opiates NONE DETECTED  NONE DETECTED   Cocaine NONE DETECTED  NONE DETECTED   Benzodiazepines NONE DETECTED  NONE DETECTED   Amphetamines NONE DETECTED  NONE DETECTED   Tetrahydrocannabinol POSITIVE (*) NONE DETECTED   Barbiturates NONE DETECTED  NONE DETECTED  URINE MICROSCOPIC-ADD ON     Status: Abnormal   Collection Time    04/20/13  2:12 PM      Result Value Range   Squamous Epithelial / LPF FEW (*) RARE   WBC, UA 0-2  <3 WBC/hpf   RBC / HPF 11-20  <3 RBC/hpf   Bacteria, UA  MANY (*) RARE   Urine-Other MUCOUS PRESENT      ED Course  Procedures IV hydration, Zofran, Pepcid and Phenergan IV.  After medications and IV fluids patient taking PO fluids without difficulty, no nausea or vomiting. Has not had diarrhea since arrival to the ED. Patient now complains of her chronic hip pain.  MDM  42 y.o. female with nausea, vomiting and diarrhea x 12 hours prior to arrival to the ED. Symptoms resolved after treatment in the ED. I have reviewed this patient's vital signs, nurses notes, appropriate labs and discussed findings and plan of care with the patient. I discussed with the patient that she will need to follow up with her doctor for her chronic hip pain.    Medication List    STOP taking these medications       meloxicam 15 MG tablet  Commonly known as:  MOBIC     oxyCODONE-acetaminophen 5-325  MG per tablet  Commonly known as:  ROXICET     predniSONE 10 MG tablet  Commonly known as:  STERAPRED UNI-PAK      TAKE these medications       cyclobenzaprine 10 MG tablet  Commonly known as:  FLEXERIL  Take 1 tablet (10 mg total) by mouth 2 (two) times daily as needed for muscle spasms.     ondansetron 4 MG tablet  Commonly known as:  ZOFRAN  Take 1 tablet (4 mg total) by mouth every 6 (six) hours.      ASK your doctor about these medications       medroxyPROGESTERone 150 MG/ML injection  Commonly known as:  DEPO-PROVERA  Inject 1 mL (150 mg total) into the muscle once.           Janne Napoleon, [place] 04/24/13 1128

## 2013-04-20 NOTE — ED Notes (Signed)
Pt reports just vomited small amount.  Will wait till Phenergan infuses to give Pepcid and fluids.  Pt demanding Fentanyl.

## 2013-04-22 ENCOUNTER — Telehealth: Payer: Self-pay | Admitting: *Deleted

## 2013-04-22 NOTE — Telephone Encounter (Signed)
As I've mentioned to her before she needs to apply for insurance through the Affordable Care Act or come up with the money to be able to see neurosurgery (groups require up front payment to see her).  You can refer her to the pain clinic but as you stated they wouldn't be able to see her for several months.  There's nowhere we can send her for a pain shot and as in my notes the ESIs only provided temporary relief and I advise against a third one.

## 2013-04-22 NOTE — Telephone Encounter (Signed)
Spoke with patient and she stated that she went to the ED at the [facility] in [facility]. Patient states that they are in extreme pain. Explained to the patient that the next step would be to see a Neurosurgeon. Wants to know if there is anywhere the patient can get a pain shot until they can see a Neurosurgeon. Wanted to know if the Pain Clinic is an option? Explained to the patient that they are scheduled out into the next year.

## 2013-04-23 ENCOUNTER — Ambulatory Visit: Payer: No Typology Code available for payment source

## 2013-04-23 ENCOUNTER — Encounter: Payer: Self-pay | Admitting: Podiatry

## 2013-04-23 ENCOUNTER — Ambulatory Visit: Payer: No Typology Code available for payment source | Admitting: Podiatry

## 2013-04-23 VITALS — BP 101/72 | HR 99 | Resp 16 | Ht 66.0 in | Wt 140.0 lb

## 2013-04-23 DIAGNOSIS — M898X9 Other specified disorders of bone, unspecified site: Secondary | ICD-10-CM

## 2013-04-23 DIAGNOSIS — M2040 Other hammer toe(s) (acquired), unspecified foot: Secondary | ICD-10-CM

## 2013-04-23 NOTE — Progress Notes (Signed)
   Subjective:    Patient ID: Andrea [name], female    DOB: 27-Nov-1970, 42 y.o.   MRN: [id number]  HPI Comments: "My toe is sore"  Patient has painful 5th toe right for a few months. The lateral side of toe near the toenail, she feels it has been digging into skin. There is a callused area that she shaved down. Extremely sensitive when walking and with shoes. Wearing a velcro surgical shoe today that she received from ER and also Rx'd an antibiotic that she's completed. Pt states that it was infected but looks better now, but still painful.  Toe Pain  Associated symptoms include numbness.      Review of Systems  Constitutional: Positive for activity change and appetite change.  HENT: Positive for sinus pressure.   Cardiovascular: Positive for leg swelling.       Calf pain with walking  Musculoskeletal: Positive for back pain and gait problem.  Skin: Positive for color change.       Open sores Change in nails  Neurological: Positive for weakness and numbness.  Psychiatric/Behavioral: Positive for behavioral problems.  All other systems reviewed and are negative.       Objective:   Physical Exam        Assessment & Plan:

## 2013-04-23 NOTE — Progress Notes (Signed)
Subjective:     Patient ID: Andrea [name], female   DOB: October 11, 1970, 42 y.o.   MRN: [id number]  Toe Pain    patient presents with lesion on the outside of fifth toe right and fifth metatarsal and severe acute back pain  Review of Systems  All other systems reviewed and are negative.       Objective:   Physical Exam  Nursing note and vitals reviewed. Constitutional: She is oriented to person, place, and time.  Cardiovascular: Intact distal pulses.   Musculoskeletal: Normal range of motion.  Neurological: She is oriented to person, place, and time.  Skin: Skin is warm.   neurovascular status intact with no muscle strength issues and distal lateral keratotic lesion fifth right and underneath the fifth metatarsal right with no drainage edema or erythema    Assessment:     Lesion secondary to structural abnormalities of the toe    Plan:     X-ray an H&P reviewed and debridement accomplished which gave her relief of pain. May have to be done periodically but she needs to get her back taken care of before we can do anything. No indication of infection

## 2013-04-29 NOTE — ED Provider Notes (Signed)
History/physical exam/procedure(s) were performed by non-physician practitioner and as supervising physician I was immediately available for consultation/collaboration. I have reviewed all notes and am in agreement with care and plan.   Shaune Pollack, MD 04/29/13 [phone]

## 2013-05-14 ENCOUNTER — Encounter: Payer: Self-pay | Admitting: Internal Medicine

## 2013-05-14 ENCOUNTER — Ambulatory Visit (INDEPENDENT_AMBULATORY_CARE_PROVIDER_SITE_OTHER): Payer: No Typology Code available for payment source | Admitting: Internal Medicine

## 2013-05-14 VITALS — BP 97/66 | HR 103 | Temp 97.6°F | Ht 66.0 in | Wt 139.8 lb

## 2013-05-14 DIAGNOSIS — M79605 Pain in left leg: Principal | ICD-10-CM

## 2013-05-14 DIAGNOSIS — M545 Low back pain, unspecified: Secondary | ICD-10-CM

## 2013-05-14 DIAGNOSIS — F3289 Other specified depressive episodes: Secondary | ICD-10-CM

## 2013-05-14 DIAGNOSIS — F329 Major depressive disorder, single episode, unspecified: Secondary | ICD-10-CM

## 2013-05-14 DIAGNOSIS — F32A Depression, unspecified: Secondary | ICD-10-CM | POA: Insufficient documentation

## 2013-05-14 MED ORDER — DULOXETINE HCL 20 MG PO CPEP: 20.0000 mg | ORAL_CAPSULE | Freq: Two times a day (BID) | ORAL | Status: DC

## 2013-05-14 NOTE — Assessment & Plan Note (Addendum)
In her visit today, patient expressed sleep disturbance, anhedonia, decreased concentration, tearfulness, decreased appetite over the past few months. She agreed her pain is likely taking a mental toll. - Provided the patient with a paper prescription for Cymbalta 20 mg twice a day as this treats depression and pain, she will try the [place] and see what it will cost her - She will call the clinic if this medicine is too expensive, and I will prescribe her an SSRI on the [employer] list if needed

## 2013-05-14 NOTE — Patient Instructions (Addendum)
Thanks for your visit. - Please call [facility] at [phone] and ask what the co-pay would be for a self-pay patient, for an office visit for neurosurgery. - Please call [facility] at [phone] and ask what the co-pay would be for a self-pay patient, for an office visit for neurosurgery. - I am happy to provide a referral to either place should you choose you can afford it, please call the clinic and let us know. - I am prescribing you a medicine called Cymbalta. This is a medicine for depression. If it is too expensive, please call the clinic and we can prescribe you a cheaper alternative, but I want to try the Cymbalta first because it also treats pain. - Unfortunately, the orange card no longer covers emergency department visits or hospital visits. If you feel ill, please first call the clinic at [phone] for a same-day appointment. - Please return to the clinic in 3 months.

## 2013-05-14 NOTE — Progress Notes (Signed)
   Subjective:    Patient ID: Andrea [name], female    DOB: [birth year], 43 y.o.   MRN: [id number]  HPI The patient is a 43 YO female PMH chronic back pain, tobacco abuse who is coming in today for a follow up.   Chronic back pain - Since 2012 s/p bowling injury, located in lumbar area radiating down left leg. Symptoms have worsened since November. Denies loss of bowel or bladder function. She has positive imaging findings: MRI 12/2010 showed L5-S1 disc degeneration with left lateral recess disc potrusion and annular tear at the descending left S1 nerve root level. She is seen at sports medicine by Dr. Barbaraann Barthel. He feels her symptoms are 2/2 lumbar radiculopathy and that she needs neurosurgical referral for microdiscectomy. However, she does not have insurance at this time except the [employer]. She has an application pending with social security. She has met with Marlana Latus who told her she doesn't make enough money to qualify for Obamacare. We tried to contact [facility] neurosurgery in 11/14, but they were not taking self-pay patients at that time.   For pain control Dr. Barbaraann Barthel has given her multiple epidural steroid injections and several courses of prednisone, and does not wish to repeat these again. Unfortunately, the patient states the epidural injections were the only things that helped her pain, albeit briefly for only about one week. In terms of oral medications, she has tried Flexeril, Mobic, amitriptyline, tramadol, prednisone, gabapentin, oxycodone and nothing has worked.   She admits to sleep disturbance, decreased concentration (her grades dropped over the holidays), anhedonia, loss of appetite, emotional lability.  Emesis - Seen in the ED for emesis on 04/20/13 and treated symptomatically. She denies nausea/vomiting today. Patient was informed that her orange card will no longer cover ED visits, so she will need to call the clinic for issues such as this in the future and arrange for a  same-day appointment.    Review of Systems  Constitutional: Negative for fever and chills.  Eyes: Negative for visual disturbance.  Respiratory: Negative for shortness of breath.   Cardiovascular: Negative for chest pain.  Gastrointestinal: Negative for nausea, vomiting, abdominal pain and diarrhea.  Genitourinary: Negative for dysuria.  Musculoskeletal: Positive for back pain.  Neurological: Positive for weakness and numbness. Negative for dizziness.  Psychiatric/Behavioral: Positive for dysphoric mood.       Objective:   Physical Exam  Constitutional: She is oriented to person, place, and time. She appears well-developed and well-nourished.  Squirming in pain on bed.  HENT:  Head: Normocephalic and atraumatic.  Eyes: Conjunctivae and EOM are normal. Pupils are equal, round, and reactive to light.  Neck: Normal range of motion. Neck supple.  Cardiovascular: Normal rate, regular rhythm and normal heart sounds.  Exam reveals no gallop and no friction rub.   No murmur heard. Pulmonary/Chest: Effort normal and breath sounds normal. No respiratory distress. She has no wheezes. She has no rales. She exhibits no tenderness.  Abdominal: Soft. There is no tenderness.  Musculoskeletal:       Lumbar back: She exhibits tenderness (Palpation of left paraspinal muscles). She exhibits no edema and no deformity.  Positive SLR on left.  Neurological: She is alert and oriented to person, place, and time. No cranial nerve deficit.  Skin: Skin is warm and dry.  Skin changes on lumbar back 2/2 epidural injections.  Psychiatric:  Tearful.       Assessment & Plan:   See problem based charting.

## 2013-05-14 NOTE — Assessment & Plan Note (Addendum)
Failed conservative management with epidural steroid injections and multiple oral medications including Flexeril, Mobic, amitriptyline, tramadol, prednisone, gabapentin, oxycodone. Patient needs neurosurgery referral for possible microdiscectomy, however she only has an [place] card. She has met with Bonna Gains, financial counselor, and does not make enough money to qualify for Obamacare. Unfortunately, she is stuck in the coverage loop hole. 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 - Patient was encouraged to call [facility] and [facility] for an estimate of how much an office visit would cost out of pocket in terms of co-pay. She was provided phone numbers for both hospital centers. She will call the clinic if a referral is needed, and I'm happy to provide it. - Prescribing Cymbalta 20 mg twice a day in the meantime - She will followup with Dr. Barbaraann Barthel as needed

## 2013-05-20 NOTE — Progress Notes (Signed)
Case discussed with Dr. Cater at the time of the visit.  We reviewed the resident's history and exam and pertinent patient test results.  I agree with the assessment, diagnosis, and plan of care documented in the resident's note.      

## 2013-06-03 ENCOUNTER — Ambulatory Visit (INDEPENDENT_AMBULATORY_CARE_PROVIDER_SITE_OTHER): Payer: No Typology Code available for payment source | Admitting: *Deleted

## 2013-06-03 ENCOUNTER — Encounter: Payer: Self-pay | Admitting: Licensed Clinical Social Worker

## 2013-06-03 ENCOUNTER — Other Ambulatory Visit: Payer: Self-pay | Admitting: Internal Medicine

## 2013-06-03 ENCOUNTER — Telehealth: Payer: Self-pay | Admitting: *Deleted

## 2013-06-03 DIAGNOSIS — Z3042 Encounter for surveillance of injectable contraceptive: Secondary | ICD-10-CM

## 2013-06-03 DIAGNOSIS — M549 Dorsalgia, unspecified: Secondary | ICD-10-CM

## 2013-06-03 DIAGNOSIS — Z3049 Encounter for surveillance of other contraceptives: Secondary | ICD-10-CM

## 2013-06-03 MED ORDER — MEDROXYPROGESTERONE ACETATE 150 MG/ML IM SUSP
150.0000 mg | Freq: Once | INTRAMUSCULAR | Status: AC
  Administered 2013-06-03: 150 mg via INTRAMUSCULAR

## 2013-06-03 NOTE — Telephone Encounter (Signed)
Thanks, I will talk to her about this at our next visit. Irregular bleeding is common with Depo. If it worsens she can come see me sooner.  Lesly Dukes, MD  [email or web address] Pager # [phone] Office # [phone]

## 2013-06-03 NOTE — Telephone Encounter (Signed)
Pt came in today for her Depo injection and wanted you to know she had a heavy cycle with clots from January 25 - 29. Also she started her cycle again today with heavy flow. She wanted to keep you informed.

## 2013-06-03 NOTE — Telephone Encounter (Signed)
Pt informed

## 2013-06-03 NOTE — Progress Notes (Signed)
CSW met with Andrea [name] during her office visit.  Andrea [name] states she was denied disability and is now applying for SSI, which is disability.  However, Andrea [name] states she has not provided any information from [facility] to [employer].  Andrea [name] would like [facility] to fax Social Security Office, [place] all medical records from [facility] since 2012.  CSW informed Andrea [name] generally SS send ROI requesting information from our office.  Andrea [name] states SSA told her to have our office fax information to provided fax number.  Andrea [name] signed ROI.  CSW informed Andrea [name] of potential cost.  CSW confirmed with front office.  If Andrea [name] request the information to be sent, Andrea [name] pays cost.  If SSA request information, SSA incurs cost.  Andrea [name] states she will wait until SSA requests the information and is aware the ROI will be on file.  Andrea [name] complained about her current pain and inability to get "shots".  Andrea [name] states she prefers not to be on medicine due to her mother having issues with prescription medication.  Andrea [name] aware she is in need of surgery but at this time can not afford.  Andrea [name] aware Pain clinic is an option but states Sports Med has been attempting to address pain.  PCP aware of Andrea [name]'s current issues per most recent [facility] note.  Andrea [name] encouraged to discuss possible alternatives with PCP during next visit.

## 2013-06-03 NOTE — Progress Notes (Signed)
Placing a referral for a chiropractor as another alternative to help manage patient's back pain, as there's [facility] orange card availability this month.  Lesly Dukes, MD  [email or web address] Pager # [phone] Office # [phone]

## 2013-07-09 ENCOUNTER — Encounter: Payer: Self-pay | Admitting: Internal Medicine

## 2013-07-09 ENCOUNTER — Ambulatory Visit (INDEPENDENT_AMBULATORY_CARE_PROVIDER_SITE_OTHER): Payer: No Typology Code available for payment source | Admitting: Internal Medicine

## 2013-07-09 VITALS — BP 116/77 | HR 133 | Temp 96.7°F | Ht 66.5 in

## 2013-07-09 DIAGNOSIS — M545 Pain in left leg: Secondary | ICD-10-CM

## 2013-07-09 DIAGNOSIS — M5126 Other intervertebral disc displacement, lumbar region: Secondary | ICD-10-CM

## 2013-07-09 DIAGNOSIS — M79605 Pain in left leg: Principal | ICD-10-CM

## 2013-07-09 DIAGNOSIS — M519 Unspecified thoracic, thoracolumbar and lumbosacral intervertebral disc disorder: Secondary | ICD-10-CM | POA: Insufficient documentation

## 2013-07-09 MED ORDER — KETOROLAC TROMETHAMINE 60 MG/2ML IM SOLN
60.0000 mg | Freq: Once | INTRAMUSCULAR | Status: AC
  Administered 2013-07-09: 60 mg via INTRAMUSCULAR

## 2013-07-09 NOTE — Progress Notes (Signed)
   Subjective:    Patient ID: Andrea [name], female    DOB: [birth year], 43 y.o.   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  She has not been able to be evaluated by a neurologist secondary to her lack of insurance and neurologist not honoring the [employer]).  She has been accepted to be evaluated at [place] but this requires a co-pay of which she does not have.  Reports that it is painful to drive secondary to sitting  In addition to sitting the classroom also exacerbates her pain.  She has been followed by Sports Medicine in [facility].  Review of Systems     Objective:   Physical Exam  Constitutional: She is oriented to person, place, and time. She appears well-developed and well-nourished. She appears distressed.  Will not sit secondary to back and leg pain, tearful  HENT:  Head: Normocephalic and atraumatic.  Eyes: Conjunctivae and EOM are normal. Pupils are equal, round, and reactive to light.  Neck: Normal range of motion.  Cardiovascular: Tachycardia present.   Pulmonary/Chest: Effort normal.  Neurological: She is alert and oriented to person, place, and time.  Skin: Skin is warm and dry.  Psychiatric: Her affect is not inappropriate.  Crying throughout most of initial exam secondary to pain          Assessment & Plan:  See separate problem list charting for details:  1. Lumbar radiculopathy: received Toradol 60 mg IM in clinic with great relief of pain per pt report -disability forms given to her PCP Dr. Lucila Maine

## 2013-07-09 NOTE — Progress Notes (Signed)
Pt left clinic at [facility] - states back pain feeling better. [name] Artrice Kraker RN 07/09/13 2:10PM

## 2013-07-09 NOTE — Patient Instructions (Signed)
You were given a Toradol shot for your pain today. Your Class disability forms were given to Dr. Lucila Maine. We will call you when they are ready.  Please bring your medicines with you each time you come.   Medicines may be  Eye drops  Herbal   Vitamins  Pills  Seeing these help [place] take care of you.

## 2013-07-14 NOTE — Assessment & Plan Note (Signed)
Awaiting evaluation by Neurosurgery.  Sports Medicine exhausted available therapies. -Toradol 60 mg IM today with moderate relief of pain

## 2013-07-16 NOTE — Addendum Note (Signed)
Addended by: Hulan Fray on: 07/16/2013 06:59 PM   Modules accepted: Orders

## 2013-07-16 NOTE — Progress Notes (Signed)
Case discussed with Dr. Schooler soon after the resident saw the patient.  We reviewed the resident's history and exam and pertinent patient test results.  I agree with the assessment, diagnosis, and plan of care documented in the resident's note. 

## 2013-08-07 NOTE — Telephone Encounter (Signed)
Error

## 2013-08-14 ENCOUNTER — Ambulatory Visit: Payer: No Typology Code available for payment source

## 2013-08-19 ENCOUNTER — Encounter: Payer: Self-pay | Admitting: Internal Medicine

## 2013-08-19 ENCOUNTER — Ambulatory Visit (INDEPENDENT_AMBULATORY_CARE_PROVIDER_SITE_OTHER): Payer: No Typology Code available for payment source | Admitting: Internal Medicine

## 2013-08-19 ENCOUNTER — Ambulatory Visit: Payer: No Typology Code available for payment source

## 2013-08-19 VITALS — BP 100/73 | HR 113 | Temp 97.3°F | Wt 142.9 lb

## 2013-08-19 DIAGNOSIS — F17200 Nicotine dependence, unspecified, uncomplicated: Secondary | ICD-10-CM

## 2013-08-19 DIAGNOSIS — Z3042 Encounter for surveillance of injectable contraceptive: Secondary | ICD-10-CM

## 2013-08-19 DIAGNOSIS — M5116 Intervertebral disc disorders with radiculopathy, lumbar region: Secondary | ICD-10-CM

## 2013-08-19 DIAGNOSIS — Z720 Tobacco use: Secondary | ICD-10-CM

## 2013-08-19 DIAGNOSIS — M5126 Other intervertebral disc displacement, lumbar region: Secondary | ICD-10-CM

## 2013-08-19 DIAGNOSIS — IMO0002 Reserved for concepts with insufficient information to code with codable children: Secondary | ICD-10-CM

## 2013-08-19 LAB — POCT URINE PREGNANCY: Preg Test, Ur: NEGATIVE

## 2013-08-19 MED ORDER — KETOROLAC TROMETHAMINE 30 MG/ML IJ SOLN
60.0000 mg | Freq: Once | INTRAMUSCULAR | Status: AC
  Administered 2013-08-19: 60 mg via INTRAMUSCULAR

## 2013-08-19 MED ORDER — KETOROLAC TROMETHAMINE 60 MG/2ML IM SOLN: 60.0000 mg | Freq: Once | INTRAMUSCULAR | Status: AC

## 2013-08-19 MED ORDER — MEDROXYPROGESTERONE ACETATE 150 MG/ML IM SUSP
150.0000 mg | Freq: Once | INTRAMUSCULAR | Status: AC
  Administered 2013-08-19: 150 mg via INTRAMUSCULAR

## 2013-08-19 NOTE — Patient Instructions (Signed)
Dear Andrea [name]  You have received your depo shot today, to be done again in 3 months and you have also received a Toradol injection for your pain.   PLEASE STOP SMOKING AS IT CAN CAUSE SERIOUS SIDE EFFECTS WITH YOUR BIRTH CONTROL INCLUDING THE POSSIBILITY OF BLOOD CLOTS, STROKE, AND POSSIBLY CLOTS TO YOUR LUNGS CAUSING SUDDEN DEATH. We have discussed these side effects in detail today and you still want to proceed with the test.  Medroxyprogesterone injection [Contraceptive] What is this medicine? MEDROXYPROGESTERONE (me DROX ee proe JES te rone) contraceptive injections prevent pregnancy. They provide effective birth control for 3 months. Depo-subQ Provera 104 is also used for treating pain related to endometriosis. This medicine may be used for other purposes; ask your health care provider or pharmacist if you have questions. COMMON BRAND NAME(S): Depo-Provera, Depo-subQ Provera 104 What should I tell my health care provider before I take this medicine? They need to know if you have any of these conditions: -frequently drink alcohol -asthma -blood vessel disease or a history of a blood clot in the lungs or legs -bone disease such as osteoporosis -breast cancer -diabetes -eating disorder (anorexia nervosa or bulimia) -high blood pressure -HIV infection or AIDS -kidney disease -liver disease -mental depression -migraine -seizures (convulsions) -stroke -tobacco smoker -vaginal bleeding -an unusual or allergic reaction to medroxyprogesterone, other hormones, medicines, foods, dyes, or preservatives -pregnant or trying to get pregnant -breast-feeding How should I use this medicine? Depo-Provera Contraceptive injection is given into a muscle. Depo-subQ Provera 104 injection is given under the skin. These injections are given by a health care professional. You must not be pregnant before getting an injection. The injection is usually given during the first 5 days after the start of a  menstrual period or 6 weeks after delivery of a baby. Talk to your pediatrician regarding the use of this medicine in children. Special care may be needed. These injections have been used in female children who have started having menstrual periods. Overdosage: If you think you have taken too much of this medicine contact a poison control center or emergency room at once. NOTE: This medicine is only for you. Do not share this medicine with others. What if I miss a dose? Try not to miss a dose. You must get an injection once every 3 months to maintain birth control. If you cannot keep an appointment, call and reschedule it. If you wait longer than 13 weeks between Depo-Provera contraceptive injections or longer than 14 weeks between Depo-subQ Provera 104 injections, you could get pregnant. Use another method for birth control if you miss your appointment. You may also need a pregnancy test before receiving another injection. What may interact with this medicine? Do not take this medicine with any of the following medications: -bosentan This medicine may also interact with the following medications: -aminoglutethimide -antibiotics or medicines for infections, especially rifampin, rifabutin, rifapentine, and griseofulvin -aprepitant -barbiturate medicines such as phenobarbital or primidone -bexarotene -carbamazepine -medicines for seizures like ethotoin, felbamate, oxcarbazepine, phenytoin, topiramate -modafinil -St. John's wort This list may not describe all possible interactions. Give your health care provider a list of all the medicines, herbs, non-prescription drugs, or dietary supplements you use. Also tell them if you smoke, drink alcohol, or use illegal drugs. Some items may interact with your medicine. What should I watch for while using this medicine? This drug does not protect you against HIV infection (AIDS) or other sexually transmitted diseases. Use of this product may cause you to  lose calcium from your bones. Loss of calcium may cause weak bones (osteoporosis). Only use this product for more than 2 years if other forms of birth control are not right for you. The longer you use this product for birth control the more likely you will be at risk for weak bones. Ask your health care professional how you can keep strong bones. You may have a change in bleeding pattern or irregular periods. Many females stop having periods while taking this drug. If you have received your injections on time, your chance of being pregnant is very low. If you think you may be pregnant, see your health care professional as soon as possible. Tell your health care professional if you want to get pregnant within the next year. The effect of this medicine may last a long time after you get your last injection. What side effects may I notice from receiving this medicine? Side effects that you should report to your doctor or health care professional as soon as possible: -allergic reactions like skin rash, itching or hives, swelling of the face, lips, or tongue -breast tenderness or discharge -breathing problems -changes in vision -depression -feeling faint or lightheaded, falls -fever -pain in the abdomen, chest, groin, or leg -problems with balance, talking, walking -unusually weak or tired -yellowing of the eyes or skin Side effects that usually do not require medical attention (report to your doctor or health care professional if they continue or are bothersome): -acne -fluid retention and swelling -headache -irregular periods, spotting, or absent periods -temporary pain, itching, or skin reaction at site where injected -weight gain This list may not describe all possible side effects. Call your doctor for medical advice about side effects. You may report side effects to FDA at 1-800-FDA-1088. Where should I keep my medicine? This does not apply. The injection will be given to you by a health  care professional. NOTE: This sheet is a summary. It may not cover all possible information. If you have questions about this medicine, talk to your doctor, pharmacist, or health care provider.  2014, Elsevier/Gold Standard. (2008-05-01 18:37:56)  Ketorolac injection What is this medicine? KETOROLAC (kee toe ROLE ak) is a non-steroidal anti-inflammatory drug (NSAID). It is used to treat moderate to severe pain for up to 5 days. It is commonly used after surgery. This medicine should not be used for more than 5 days. This medicine may be used for other purposes; ask your health care provider or pharmacist if you have questions. COMMON BRAND NAME(S): Toradol What should I tell my health care provider before I take this medicine? They need to know if you have any of these conditions: -asthma, especially aspirin-sensitive asthma -bleeding problems -kidney disease -stomach bleed, ulcer, or other problem -taking aspirin, other NSAID, or probenecid -an unusual or allergic reaction to ketorolac, tromethamine, aspirin, other NSAIDs, other medicines, foods, dyes or preservatives -pregnant or trying to get pregnant -breast-feeding How should I use this medicine? This medicine is for injection into a muscle or into a vein. It is given by a health care professional in a hospital or clinic setting. Talk to your pediatrician regarding the use of this medicine in children. While this drug may be prescribed for children as young as 35 years old for selected conditions, precautions do apply. Patients over 80 years old may have a stronger reaction and need a smaller dose. Overdosage: If you think you have taken too much of this medicine contact a poison control center or emergency room at  once. NOTE: This medicine is only for you. Do not share this medicine with others. What if I miss a dose? This does not apply. What may interact with this medicine? Do not take this medicine with any of the following  medications: -aspirin and aspirin-like medicines -cidofovir -methotrexate -NSAIDs, medicines for pain and inflammation, like ibuprofen or naproxen -pentoxifylline -probenecid This medicine may also interact with the following medications: -alcohol -alendronate -alprazolam -carbamazepine -diuretics -flavocoxid -fluoxetine -ginkgo -lithium -medicines for blood pressure like enalapril -medicines that affect platelets like pentoxifylline -medicines that treat or prevent blood clots like heparin, warfarin -muscle relaxants -pemetrexed -phenytoin -thiothixene This list may not describe all possible interactions. Give your health care provider a list of all the medicines, herbs, non-prescription drugs, or dietary supplements you use. Also tell them if you smoke, drink alcohol, or use illegal drugs. Some items may interact with your medicine. What should I watch for while using this medicine? Tell your doctor or healthcare professional if your symptoms do not start to get better or if they get worse. This medicine does not prevent heart attack or stroke. In fact, this medicine may increase the chance of a heart attack or stroke. The chance may increase with longer use of this medicine and in people who have heart disease. If you take aspirin to prevent heart attack or stroke, talk with your doctor or health care professional. Do not take medicines such as ibuprofen and naproxen with this medicine. Side effects such as stomach upset, nausea, or ulcers may be more likely to occur. Many medicines available without a prescription should not be taken with this medicine. This medicine can cause ulcers and bleeding in the stomach and intestines at any time during treatment. Do not smoke cigarettes or drink alcohol. These increase irritation to your stomach and can make it more susceptible to damage from this medicine. Ulcers and bleeding can happen without warning symptoms and can cause death. This  medicine can cause you to bleed more easily. Try to avoid damage to your teeth and gums when you brush or floss your teeth. What side effects may I notice from receiving this medicine? Side effects that you should report to your doctor or health care professional as soon as possible: -allergic reactions like skin rash, itching or hives, swelling of the face, lips, or tongue -breathing problems -high blood pressure -nausea, vomiting -redness, blistering, peeling or loosening of the skin, including inside the mouth -severe stomach pain -signs and symptoms of bleeding such as bloody or black, tarry stools; red or dark-brown urine; spitting up blood or brown material that looks like coffee grounds; red spots on the skin; unusual bruising or bleeding from the eye, gums, or nose -signs and symptoms of a blood clot changes in vision; chest pain; severe, sudden headache; trouble speaking; sudden numbness or weakness of the face, arm, or leg -trouble passing urine or change in the amount of urine -unexplained weight gain or swelling -unusually weak or tired -yellowing of eyes or skin  Side effects that usually do not require medical attention (report to your doctor or health care professional if they continue or are bothersome): -diarrhea -dizziness -headache -heartburn This list may not describe all possible side effects. Call your doctor for medical advice about side effects. You may report side effects to FDA at 1-800-FDA-1088. Where should I keep my medicine? This drug is given in a hospital or clinic and will not be stored at home. NOTE: This sheet is a summary. It may  not cover all possible information. If you have questions about this medicine, talk to your doctor, pharmacist, or health care provider.  2014, Elsevier/Gold Standard. (2012-08-27 16:34:56)

## 2013-08-19 NOTE — Progress Notes (Signed)
Case discussed with Dr. Qureshi at the time of the visit.  We reviewed the resident's history and exam and pertinent patient test results.  I agree with the assessment, diagnosis, and plan of care documented in the resident's note. 

## 2013-08-19 NOTE — Progress Notes (Signed)
Subjective:   Patient ID: Andrea [name] female   DOB: Jun 08, 1970 43 y.o.   MRN: [id number]  HPI: [name] is a 43 y.o. 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  She reports she came in today for her next Depo-provera shot that she gets every 3 months, last one was in February 2015, and also has severe left sided back pain that is chronic but recently worse and Toradol injection helped significantly in the past.  Of note, she was seen by Dr. Michail Sermon in March 2015 for similar complaints and that is when she received the Toradol injection with significant relief.  She has apparently been accepted at [facility] for neurology evaluation, however, she says she cannot afford to make those appointments. She has not been taking the cymbalta that Dr. Lucila Maine started for her back pain due to affordability and that nothing has helped her other than the Toradol injection temporarily.   Of note, [name] continues to smoke daily and I reviewed the possible side-effects of her depo provera injections along with smoking including stroke or DVT/PE.  She says she has been told of this before and she appreciates the concern, however, she would like to proceed with both and is not willing to stop either at this time. She says maybe when her stress level goes down, she may be able to stop smoking.  She denies being sexually active recently and reports taking Depo-provera for very heavy menstrual cycles that have significantly improved. Urine preg test in opc today was negative.   Past Medical History  Diagnosis Date  . Low back pain radiating to left leg 11/29/2010  . Dysmenorrhea 12/06/2009    Qualifier: Diagnosis of  By: [name] RN, Regino Schultze    . Chronic back pain    Current Outpatient Prescriptions  Medication Sig Dispense Refill    . DULoxetine (CYMBALTA) 20 MG capsule Take 1 capsule (20 mg total) by mouth 2 (two) times daily.  60 capsule  2  . medroxyPROGESTERone (DEPO-PROVERA) 150 MG/ML injection Inject 1 mL (150 mg total) into the muscle once.  1 mL  0   Current Facility-Administered Medications  Medication Dose Route Frequency Provider Last Rate Last Dose  . medroxyPROGESTERone (DEPO-PROVERA) injection 150 mg  150 mg Intramuscular Q90 days Gaylyn Rong, RN   150 mg at 10/11/11 1520   Family History  Problem Relation Age of Onset  . Diabetes Mother   . Hypertension Mother   . Hypertension Father   . Heart attack Maternal Grandfather   . Hypertension Maternal Grandfather   . Heart attack Paternal Grandfather   . Hypertension Paternal Grandfather    History   Social History  . Marital Status: Single    Spouse Name: N/A    Number of Children: N/A  . Years of Education: N/A   Social History Main Topics  . Smoking status: Current Every Day Smoker -- 0.50 packs/day    Types: Cigarettes  . Smokeless tobacco: None     Comment: States increased pain.  . Alcohol Use: No  . Drug Use: No     Comment: previoulsly.  [name] Sexual Activity: None   Other Topics Concern  . None   Social History Narrative  . None   Review of Systems:  Constitutional:  Denies fever, chills  HEENT:  Denies  congestion  Respiratory:  Denies SOB, cough, and wheezing.   Cardiovascular:  Denies chest pain and leg swelling.   Gastrointestinal:  Denies nausea, vomiting, abdominal pain  Genitourinary:  Denies dysuria  Musculoskeletal:  Chronic back pain radiating down left buttock to foot.   Skin:  Denies pallor, rash and wound.   Neurological:  Denies syncope   Objective:  Physical Exam: Filed Vitals:   08/19/13 1540  BP: 100/73  Pulse: 113  Temp: 97.3 F (36.3 C)  TempSrc: Oral  Weight: 142 lb 14.4 oz (64.819 kg)  SpO2: 99%   Vitals reviewed. General: standing up, acute distress due to pain, using one crutch while  standing HEENT: EOMI Cardiac: Tachycardia Pulm: clear to auscultation bilaterally, no wheezes, rales, or rhonchi Abd: soft, nontender, BS present MSK: limited mobility of lower extremities due to pain, examination done while standing, use 1 crutch for support, tenderness to palpation lower back, left >right, radiating down left buttock down to feet.  Neuro: alert and oriented X3, using crutch when walking, limited due to pain  Assessment & Plan:  Discussed with Dr. Stann Mainland Toradol x1 and Depo injection given today Needs neuro/neurosurgery follow up but says cannot afford Continues to smoke despite counseling and insists on depo shot

## 2013-08-20 NOTE — Assessment & Plan Note (Deleted)
Continued severe lower back pain radiating down left arm. Has failed sports medicine therapy and has been referred for outpatient neurology/neurosurgery but says she cannot afford to go. Claims last toradol injection given in March by Dr. Michail Sermon provided significant relief and requests another today. Has not been taking cymbalta.   -repeat Toradol injection 60mg  x1 today. Explained this is not a good option as long term pain control and she really needs to to her referral and hopefully can get the finances to make her appointment.  -will forward this to PCP who may consider social work assistance?

## 2013-08-20 NOTE — Assessment & Plan Note (Addendum)
Continued severe lower back pain radiating down left arm. Has failed sports medicine therapy and has been referred for outpatient neurology/neurosurgery but says she cannot afford to go. Claims last toradol injection given in March by Dr. Michail Sermon provided significant relief and requests another today. Has not been taking cymbalta.   -repeat Toradol injection 60mg  x1 today. Explained this is not a good option as long term pain control and she really needs to to her referral and hopefully can get the finances to make her appointment.  -Consider social work assistance if possible to help patient make her appointments or possibly affordability?

## 2013-08-20 NOTE — Assessment & Plan Note (Signed)
Assessment: Progress toward smoking cessation:  smoking the same amount Barriers to progress toward smoking cessation:    Comments: not willing to quit at this time  Plan: Instruction/counseling given:  I counseled patient on the dangers of tobacco use, advised patient to stop smoking, and reviewed strategies to maximize success. Educational resources provided:    Self management tools provided:    Medications to assist with smoking cessation:  None Patient agreed to the following self-care plans for smoking cessation: set a quit date and stop smoking;call [facility] (1-800-QUIT-NOW)  Other plans: says chantix doesn't work

## 2013-08-20 NOTE — Assessment & Plan Note (Addendum)
Similar to Dr.Ho's last assessment. I reviewed the risk of Depo-provera injections in conjunction with smoking again today that she insists on getting including possibility of death, DVT/PE, and even CVA. She claims to be aware of the risks and is willing to take the risk of continuing Depo injections and will continue smoking as well. She is not willing to quit either at this time despite counseling.  -urine preg neg, has not been sexually active she claims -Dr. Silverio Decamp had noted in Sept 2013 that she did not want to continue anymore shots of Depo until she was seen by GYN, but this does not seem to have been done.  -I will defer this to PCP Dr. Lucila Maine who appears to have ordered the injections q3 months. -discussed with Dr. Stann Mainland and did give another Depo injection today. Last one was noted to be in feb 2015

## 2013-08-25 ENCOUNTER — Ambulatory Visit: Payer: No Typology Code available for payment source

## 2013-11-10 ENCOUNTER — Ambulatory Visit (INDEPENDENT_AMBULATORY_CARE_PROVIDER_SITE_OTHER): Payer: No Typology Code available for payment source | Admitting: *Deleted

## 2013-11-10 DIAGNOSIS — Z3049 Encounter for surveillance of other contraceptives: Secondary | ICD-10-CM

## 2013-11-10 DIAGNOSIS — Z3042 Encounter for surveillance of injectable contraceptive: Secondary | ICD-10-CM

## 2013-11-10 MED ORDER — MEDROXYPROGESTERONE ACETATE 150 MG/ML IM SUSP
150.0000 mg | INTRAMUSCULAR | Status: DC
  Administered 2013-11-10: 150 mg via INTRAMUSCULAR

## 2013-11-19 ENCOUNTER — Encounter (HOSPITAL_BASED_OUTPATIENT_CLINIC_OR_DEPARTMENT_OTHER): Payer: Self-pay | Admitting: Emergency Medicine

## 2013-11-19 ENCOUNTER — Emergency Department (HOSPITAL_BASED_OUTPATIENT_CLINIC_OR_DEPARTMENT_OTHER)
Admission: EM | Admit: 2013-11-19 | Discharge: 2013-11-19 | Disposition: A | Discharge status: Home / Self Care | Payer: No Typology Code available for payment source | Attending: Emergency Medicine | Admitting: Emergency Medicine

## 2013-11-19 DIAGNOSIS — G8929 Other chronic pain: Secondary | ICD-10-CM | POA: Insufficient documentation

## 2013-11-19 DIAGNOSIS — IMO0001 Reserved for inherently not codable concepts without codable children: Secondary | ICD-10-CM | POA: Insufficient documentation

## 2013-11-19 DIAGNOSIS — M549 Dorsalgia, unspecified: Secondary | ICD-10-CM | POA: Insufficient documentation

## 2013-11-19 DIAGNOSIS — Z8742 Personal history of other diseases of the female genital tract: Secondary | ICD-10-CM | POA: Insufficient documentation

## 2013-11-19 DIAGNOSIS — F17200 Nicotine dependence, unspecified, uncomplicated: Secondary | ICD-10-CM | POA: Insufficient documentation

## 2013-11-19 DIAGNOSIS — M2550 Pain in unspecified joint: Secondary | ICD-10-CM | POA: Insufficient documentation

## 2013-11-19 MED ORDER — METHYLPREDNISOLONE 4 MG PO KIT: PACK | ORAL | Status: DC

## 2013-11-19 MED ORDER — KETOROLAC TROMETHAMINE 30 MG/ML IJ SOLN
60.0000 mg | Freq: Once | INTRAMUSCULAR | Status: AC
  Administered 2013-11-19: 60 mg via INTRAMUSCULAR
  Filled 2013-11-19: qty 2

## 2013-11-19 MED ORDER — KETOROLAC TROMETHAMINE 30 MG/ML IJ SOLN: 30.0000 mg | Freq: Once | INTRAMUSCULAR | Status: DC

## 2013-11-19 MED ORDER — OXYCODONE-ACETAMINOPHEN 5-325 MG PO TABS: 2.0000 | ORAL_TABLET | ORAL | Status: DC | PRN

## 2013-11-19 NOTE — ED Provider Notes (Signed)
CSN: [id number]     Arrival date & time 11/19/13  1008 History   First MD Initiated Contact with Patient 11/19/13 1014     Chief Complaint  Patient presents with  . Back Pain      HPI  Patient presents with an exacerbation of her chronic back pain.  Follows with internal medicine clinic. Has been seen by Dr. Barbaraann Barthel of [facility]. Had received epidural steroid injections. She has a rather elaborate story about her "lawyer" that is "trying to get me on disability". States she cannot afford to see a neurosurgeon that she's been referred to for consultation. No weakness.  No bowel or bladder changes.  MRI 2012 is as follows: IMPRESSION:  1. 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 Mild overall spinal stenosis.  2. Mild to moderate L4-L5 facet degeneration.   Past Medical History  Diagnosis Date  . Low back pain radiating to left leg 11/29/2010  . Dysmenorrhea 12/06/2009    Qualifier: Diagnosis of  By: [name] RN, Regino Schultze    . Chronic back pain    Past Surgical History  Procedure Laterality Date  . Laproscopy surgery to check her tubes     Family History  Problem Relation Age of Onset  . Diabetes Mother   . Hypertension Mother   . Hypertension Father   . Heart attack Maternal Grandfather   . Hypertension Maternal Grandfather   . Heart attack Paternal Grandfather   . Hypertension Paternal Grandfather    History  Substance Use Topics  . Smoking status: Current Every Day Smoker -- 0.50 packs/day    Types: Cigarettes  . Smokeless tobacco: Not on file     Comment: States increased pain.  . Alcohol Use: No   OB History   Grav Para Term Preterm Abortions TAB SAB Ect Mult Living                 Review of Systems  Constitutional: Negative for fever, chills, diaphoresis, appetite change and fatigue.  HENT: Negative for mouth sores, sore throat and trouble swallowing.   Eyes:  Negative for visual disturbance.  Respiratory: Negative for cough, chest tightness, shortness of breath and wheezing.   Cardiovascular: Negative for chest pain.  Gastrointestinal: Negative for nausea, vomiting, abdominal pain, diarrhea and abdominal distention.  Endocrine: Negative for polydipsia, polyphagia and polyuria.  Genitourinary: Negative for dysuria, frequency and hematuria.  Musculoskeletal: Positive for arthralgias, back pain, gait problem and myalgias.  Skin: Negative for color change, pallor and rash.  Neurological: Negative for dizziness, syncope, weakness, light-headedness, numbness and headaches.  Hematological: Does not bruise/bleed easily.  Psychiatric/Behavioral: Negative for behavioral problems and confusion.      Allergies  Hydrocodone  Home Medications   Prior to Admission medications   Medication Sig Start Date End Date Taking? Authorizing Provider  oxyCODONE-acetaminophen (PERCOCET) 10-650 MG per tablet Take 1 tablet by mouth every 6 (six) hours as needed for pain.   Yes Historical Provider, MD  DULoxetine (CYMBALTA) 20 MG capsule Take 1 capsule (20 mg total) by mouth 2 (two) times daily. 05/14/13 05/14/14  Lesly Dukes, MD  medroxyPROGESTERone (DEPO-PROVERA) 150 MG/ML injection Inject 1 mL (150 mg total) into the muscle once. 10/11/11   Ansel Bong, MD  methylPREDNISolone (MEDROL DOSEPAK) 4 MG tablet 6 po on day 1, decrease by 1 tab per day 11/19/13   Tanna Furry, MD  oxyCODONE-acetaminophen (PERCOCET/ROXICET) 5-325 MG  per tablet Take 2 tablets by mouth every 4 (four) hours as needed for moderate pain or severe pain. 11/19/13   Tanna Furry, MD   BP 146/98  Pulse 79  Temp(Src) 98 F (36.7 C) (Oral)  Resp 18  SpO2 99% Physical Exam  Constitutional: She is oriented to person, place, and time. She appears well-developed and well-nourished. No distress.  HENT:  Head: Normocephalic.  Eyes: Conjunctivae are normal. Pupils are equal, round, and reactive to light. No  scleral icterus.  Neck: Normal range of motion. Neck supple. No thyromegaly present.  Cardiovascular: Normal rate and regular rhythm.  Exam reveals no gallop and no friction rub.   No murmur heard. Pulmonary/Chest: Effort normal and breath sounds normal. No respiratory distress. She has no wheezes. She has no rales.  Abdominal: Soft. Bowel sounds are normal. She exhibits no distension. There is no tenderness. There is no rebound.  Musculoskeletal: Normal range of motion.  Neurological: She is alert and oriented to person, place, and time.  Normal symmetric Strength to shoulder shrug, triceps, biceps, grip,wrist flex/extend,and intrinsics  Norma lsymmetric sensation above and below clavicles, and to all distributions to UEs. Norma symmetric strength to flex/.extend hip and knees, dorsi/plantar flex ankles. Normal symmetric sensation to all distributions to LEs Patellar and achilles reflexes 1-2+. Downgoing Babinski   Skin: Skin is warm and dry. No rash noted.  Psychiatric: She has a normal mood and affect. Her behavior is normal.    ED Course  Procedures (including critical care time) Labs Review Labs Reviewed - No data to display  Imaging Review No results found.   EKG Interpretation None      MDM   Final diagnoses:  Chronic back pain    No historical symptoms, or exam findings that would suggest acute herniated nucleus. No cauda equina. She is standing in the room as I examine her. She is able to sit. Intact reflexes. Intact strength to heel stand toe stand harshly squat and stand. She is limited by pain. Is not. Discussion yesterday continue to be diligent about obtaining insurance by whatever means necessary for her neurosurgeon consult. She is followed at the [facility] medicine [facility] clinic and asked her to continue to follow there for any additional ongoing meds and needs.    Tanna Furry, MD 11/19/13 1110

## 2013-11-19 NOTE — ED Notes (Signed)
Pt amb to room 11 with slow, steady gait using cane in nad. Pt reports chronic back pain x 2012, her home pain meds are not working.

## 2013-11-19 NOTE — Discharge Instructions (Signed)

## 2013-12-03 ENCOUNTER — Encounter: Payer: Self-pay | Admitting: Internal Medicine

## 2013-12-03 ENCOUNTER — Ambulatory Visit (INDEPENDENT_AMBULATORY_CARE_PROVIDER_SITE_OTHER): Payer: No Typology Code available for payment source | Admitting: Internal Medicine

## 2013-12-03 VITALS — BP 120/85 | HR 113 | Temp 98.1°F | Ht 66.0 in | Wt 142.2 lb

## 2013-12-03 DIAGNOSIS — M5126 Other intervertebral disc displacement, lumbar region: Secondary | ICD-10-CM

## 2013-12-03 DIAGNOSIS — M5116 Intervertebral disc disorders with radiculopathy, lumbar region: Secondary | ICD-10-CM

## 2013-12-03 DIAGNOSIS — F32A Depression, unspecified: Secondary | ICD-10-CM

## 2013-12-03 DIAGNOSIS — F329 Major depressive disorder, single episode, unspecified: Secondary | ICD-10-CM

## 2013-12-03 DIAGNOSIS — F3289 Other specified depressive episodes: Secondary | ICD-10-CM

## 2013-12-03 DIAGNOSIS — R000 Tachycardia, unspecified: Secondary | ICD-10-CM

## 2013-12-03 MED ORDER — OXYCODONE-ACETAMINOPHEN 5-325 MG PO TABS: 1.0000 | ORAL_TABLET | Freq: Four times a day (QID) | ORAL | Status: DC | PRN

## 2013-12-03 MED ORDER — KETOROLAC TROMETHAMINE 60 MG/2ML IM SOLN
60.0000 mg | Freq: Once | INTRAMUSCULAR | Status: AC
  Administered 2013-12-03: 60 mg via INTRAMUSCULAR

## 2013-12-03 MED ORDER — MELOXICAM 7.5 MG PO TABS: 7.5000 mg | ORAL_TABLET | Freq: Every day | ORAL | Status: DC

## 2013-12-03 NOTE — Patient Instructions (Addendum)
-  Start taking mobic 7.5 mg daily -Will give you toradol 60 mg IM injection today -Please fill your percocet prescription -Will see you back as needed, pleasure meeting you!   General Instructions:   Please bring your medicines with you each time you come to clinic.  Medicines may include prescription medications, over-the-counter medications, herbal remedies, eye drops, vitamins, or other pills.   Progress Toward Treatment Goals:  Treatment Goal 08/19/2013  Stop smoking smoking the same amount    Self Care Goals & Plans:  Self Care Goal 08/19/2013  Manage my medications take my medicines as prescribed  Eat healthy foods eat more vegetables; eat foods that are low in salt  Be physically active -  Stop smoking set a quit date and stop smoking; call [facility] (1-800-QUIT-NOW)    No flowsheet data found.   Care Management & Community Referrals:  No flowsheet data found.

## 2013-12-03 NOTE — Progress Notes (Signed)
Patient ID: Andrea [name], female   DOB: 07-05-1970, 43 y.o.   MRN: [id number]    Subjective:   Patient ID: Andrea [name] female   DOB: Aug 29, 1970 43 y.o.   MRN: [id number]  HPI: Andrea [name] is a 43 y.o. pleasant woman with past medical history of low back pain since 2012 due to annular tear and disc protrusion, dysmenorrhea, and depression who presents with chief complaint of worsening chronic low back pain.   She reports having non-traumatic low-back pain since 2012. MRI imaging at that time revealed L5-S1 disc degeneration with left lateral recess disc protrusion and annular tear (at the descending left S1 nerve root level) in addition to mild overall spinal stenosis. She was seen by Dr. Barbaraann Barthel and underwent physical therapy and had two epidural injection in 2014 without relief. She reports being told she needs surgical intervention but could not afford it. She has been having sharp 10/10 pain across her back and recently also right hip pain that worsens with prolonged standing. She has chronic sciatica pain on her left side in addition to chronic weakness in both of her lower extremities with recent fall without injury or trauma. She is using crutches and cane to ambulate. She was seen in the ED recently on 11/19/13 and given a prescription for percocet which she was not able to afford but will fill today with the help of her friend. She was also given a medrol dose pack which she has not filled either. She received 25 mg Toradol injection in the ED which did not help but reports receiving 60 mg injection in clinic twice before that helped ease her pain. She is not taking OTC NSAIDs or tylenol. She reports weight loss (however wt stable since last visit in April 2015) but denies fever, chills, bladder/bowel incontinence, or saddle anaesthesia.     She reports her mood is stable and never filled prescription for cymbalta. She reports family history of thyroid problems. She reports staying cold all  the time and recently losing weight. She has never had thyroid function testing in the past.     Past Medical History  Diagnosis Date  . Low back pain radiating to left leg 11/29/2010  . Dysmenorrhea 12/06/2009    Qualifier: Diagnosis of  By: [name] RN, Regino Schultze    . Chronic back pain    Current Outpatient Prescriptions  Medication Sig Dispense Refill  . DULoxetine (CYMBALTA) 20 MG capsule Take 1 capsule (20 mg total) by mouth 2 (two) times daily.  60 capsule  2  . medroxyPROGESTERone (DEPO-PROVERA) 150 MG/ML injection Inject 1 mL (150 mg total) into the muscle once.  1 mL  0  . methylPREDNISolone (MEDROL DOSEPAK) 4 MG tablet 6 po on day 1, decrease by 1 tab per day  21 tablet  0  . oxyCODONE-acetaminophen (PERCOCET) 10-650 MG per tablet Take 1 tablet by mouth every 6 (six) hours as needed for pain.      [name] oxyCODONE-acetaminophen (PERCOCET/ROXICET) 5-325 MG per tablet Take 2 tablets by mouth every 4 (four) hours as needed for moderate pain or severe pain.  20 tablet  0   No current facility-administered medications for this visit.   Family History  Problem Relation Age of Onset  . Diabetes Mother   . Hypertension Mother   . Hypertension Father   . Heart attack Maternal Grandfather   . Hypertension Maternal Grandfather   . Heart attack Paternal Grandfather   . Hypertension Paternal Grandfather  History   Social History  . Marital Status: Single    Spouse Name: N/A    Number of Children: N/A  . Years of Education: N/A   Social History Main Topics  . Smoking status: Current Every Day Smoker -- 0.50 packs/day    Types: Cigarettes  . Smokeless tobacco: Not on file     Comment: States increased pain./ 2 CIGARETTES PER DAY  . Alcohol Use: No  . Drug Use: No     Comment: previoulsly.  [name] Sexual Activity: Not Currently   Other Topics Concern  . Not on file   Social History Narrative  . No narrative on file   Review of Systems: Review of Systems  Constitutional: Positive  for weight loss. Negative for fever and chills.  Eyes: Negative for blurred vision.  Respiratory: Negative for cough and shortness of breath.   Cardiovascular: Negative for chest pain, palpitations and leg swelling.  Gastrointestinal: Negative for nausea, vomiting, abdominal pain and diarrhea.  Genitourinary: Negative for dysuria, urgency, frequency and hematuria.       No vaginal bleeding  Musculoskeletal: Positive for falls and joint pain. Negative for myalgias.  Skin:       Feeling cold   Neurological: Positive for sensory change (left sciatica) and focal weakness (bilateral LE). Negative for headaches.    Objective:  Physical Exam: Filed Vitals:   12/03/13 0940  BP: 120/85  Pulse: 113  Temp: 98.1 F (36.7 C)  TempSrc: Oral  Height: 5\' 6"  (1.676 m)  Weight: 142 lb 3.2 oz (64.501 kg)  SpO2: 97%   Physical Exam  Constitutional: She is oriented to person, place, and time. She appears well-developed and well-nourished. She appears distressed (standing up in pain).  HENT:  Head: Normocephalic and atraumatic.  Eyes: EOM are normal.  Neck: Neck supple. No thyromegaly present.  Cardiovascular: Normal rate, regular rhythm and normal heart sounds.   Pulmonary/Chest: Effort normal and breath sounds normal. No respiratory distress. She has no wheezes. She has no rales.  Abdominal: Soft. Bowel sounds are normal. She exhibits no distension. There is no tenderness. There is no rebound and no guarding.  Musculoskeletal: She exhibits no edema.  Decreased ROM of lumbar spine with paraspinal tenderness Positive left straight leg test 4/5 Left LE weakness (poor effort due to pain), otherwise 5/5 muscle strength Decreased sensation to light touch of left LE Ambulates with cane  Neurological: She is alert and oriented to person, place, and time.  Skin: Skin is warm and dry. She is not diaphoretic.  Psychiatric: She has a normal mood and affect. Her behavior is normal. Judgment and thought  content normal.    Assessment & Plan:   Please see problem list for problem-based assessment and plan

## 2013-12-04 DIAGNOSIS — R000 Tachycardia, unspecified: Secondary | ICD-10-CM | POA: Insufficient documentation

## 2013-12-04 NOTE — Assessment & Plan Note (Addendum)
Assessment: Pt with well-controlled depression not compliant with SSRI therapy who presents with stable mood.   Plan:  -Discontinue cymbalta 20 mg BID (pt reports never taking it) -Continue to monitor

## 2013-12-04 NOTE — Assessment & Plan Note (Addendum)
Assessment: Pt with chronic low back pain with left sciatica due to annular tear and L5-S1 disc protrusion s/p epidural injection (x 2) in 2014 with recent ED admission for uncontrolled pain due to inability to afford medications who presents with no alarm symptoms.   Plan:  -Administer toradol 60 mg IM injection due to past relief of pain -Prescribe oxycodone-acetaminophen 5-325 mg Q 6 hr 30 pills with zero refills (discontinue ED narcotic orders), pt reports friend will pay for the medication -Discontinue recently prescribed medrol dose pack (did not fill prescription)  -Prescribe meloxicam 7.5 mg daily for inflammation, pt reports her friend will pay for the medication -Continue walking cane and crutches  -Unfortunately pt cannot afford neurosurgery referral at this time

## 2013-12-04 NOTE — Assessment & Plan Note (Addendum)
Assessment: Pt with chronic tachycardia since 2012 with no prior work-up who presents with asymptomatic tachycardia (HR 113).   Plan: -Obtain 12-lead EKG at next visit (no prior records) -Obtain TSH level at next visit (declined at this time) to assess for thyroid dysfunction

## 2013-12-18 ENCOUNTER — Other Ambulatory Visit: Payer: Self-pay | Admitting: *Deleted

## 2013-12-18 NOTE — Telephone Encounter (Signed)
Made appt

## 2013-12-22 ENCOUNTER — Encounter: Payer: Self-pay | Admitting: Internal Medicine

## 2013-12-22 ENCOUNTER — Ambulatory Visit (INDEPENDENT_AMBULATORY_CARE_PROVIDER_SITE_OTHER): Payer: No Typology Code available for payment source | Admitting: Internal Medicine

## 2013-12-22 VITALS — BP 115/80 | HR 128 | Temp 98.5°F | Ht 66.5 in | Wt 140.6 lb

## 2013-12-22 DIAGNOSIS — M5126 Other intervertebral disc displacement, lumbar region: Secondary | ICD-10-CM

## 2013-12-22 DIAGNOSIS — R000 Tachycardia, unspecified: Secondary | ICD-10-CM

## 2013-12-22 DIAGNOSIS — M5116 Intervertebral disc disorders with radiculopathy, lumbar region: Secondary | ICD-10-CM

## 2013-12-22 MED ORDER — METHYLPREDNISOLONE (PAK) 4 MG PO TABS: ORAL_TABLET | ORAL | Status: DC

## 2013-12-22 MED ORDER — ONDANSETRON HCL 4 MG PO TABS: 4.0000 mg | ORAL_TABLET | Freq: Three times a day (TID) | ORAL | Status: DC | PRN

## 2013-12-22 MED ORDER — OXYCODONE HCL 7.5 MG PO TABS: 1.0000 | ORAL_TABLET | Freq: Two times a day (BID) | ORAL | Status: DC

## 2013-12-22 NOTE — Assessment & Plan Note (Signed)
Assessment: Pt with chronic tachycardia since 2012 with no prior work-up who presents with asymptomatic tachycardia (HR 128).   Plan:  -Pt declined 12-lead EKG however agreeable to at next visit (no prior records)  -Obtain TSH level at next visit (declined at this time) to assess for thyroid dysfunction

## 2013-12-22 NOTE — Assessment & Plan Note (Addendum)
Assessment: Pt with chronic low back pain with left sciatica due to annular tear and L5-S1 disc protrusion s/p epidural injection (x 2) in 2014 who presents with moderately well-controlled pain and worsening symptoms of weakness with recent fall and stool incontinence with concern for cord compression, however unlikely given unchanged physical exam findings from prior visit on 12/03/13.   Plan:  -Pt declined lumbar spine imaging at this time but agreeable to at next visit, consider repeat MRI (last one in 2012) in setting of worsening neurological symptoms -Prescribe medrol dose pack (pt to follow instructions) for probable worsening inflammation  -Change oxyocodone-acetaminophen 5-325 mg Q 6 hr to 7-325 mg BID PRN pain due to uncontrolled pain   -Prescribe zofran 4 mg Q 8 hr PRN nausea -Continue meloxicam 7.5 mg daily for inflammation and biofreeze topical agent for pain relief -Unable to refer to neurosurgery at this time due to pt's lack of insurance, consider calling [facility] or [facility] to see if they would be able to possibly accommodate her

## 2013-12-22 NOTE — Patient Instructions (Signed)
-  Take medrol dose pack as instructed -Take mobic daily and oxycodone 7.5 mg every 12 hours as needed for pain -Take zofran as needed for nausea -Dr. Genene Churn will see you back on 9/17   General Instructions:   Thank you for bringing your medicines today. This helps [place] keep you safe from mistakes.   Progress Toward Treatment Goals:  Treatment Goal 08/19/2013  Stop smoking smoking the same amount    Self Care Goals & Plans:  Self Care Goal 12/22/2013  Manage my medications take my medicines as prescribed; bring my medications to every visit; refill my medications on time; follow the sick day instructions if I am sick  Eat healthy foods eat more vegetables; eat fruit for snacks and desserts; eat foods that are low in salt; eat smaller portions  Be physically active find an activity I enjoy  Stop smoking -    No flowsheet data found.   Care Management & Community Referrals:  No flowsheet data found.

## 2013-12-22 NOTE — Progress Notes (Signed)
Patient ID: Andrea [name], female   DOB: 1970-06-19, 43 y.o.   MRN: [id number]    Subjective:   Patient ID: Andrea [name] female   DOB: November 27, 1970 43 y.o.   MRN: [id number]  HPI: Andrea CHARMELLE [name] is a 43 y.o. pleasant woman with past medical history of low back pain since 2012 due to annular tear and disc protrusion, dysmenorrhea, and depression who presents with chief complaint of worsening chronic low back pain.   She reports that since last visit on 8/12 she has been taking three Roxicet daily at once because every 6 hours was not controlling her pain. She also has been taking meloxicam daily and applying biofreeze to her back which helps. She believes her symptoms are worsening and reports falling down since the last visit due to worsening weakness in her LE (worse on left). She has chronic sciatica pain on her left side with numbness and tingling. She denies saddle anaesthesia but reports two episodes of loose stools with incontinence (unsure if she had gas or BM at the time and consequently decfecated). She reports losing weight because she cannot stand to cook meals. She continues to use a walking cane to ambulate. She has not had recent imaging since MRI in 2012 which revealed L5-S1 disc degeneration with left lateral recess disc protrusion and annular tear (at the descending left S1 nerve root level) in addition to mild overall spinal stenosis. She was to have surgical consultation at [facility] but could not afford the office visit and consequently never went. She has been on tramadol, ibuprofen, and flexeril in the past without relief. She has been on prednisone in the past with mild relief (three course with last one in 03/2013).     Past Medical History  Diagnosis Date  . Low back pain radiating to left leg 11/29/2010  . Dysmenorrhea 12/06/2009    Qualifier: Diagnosis of  By: [name] RN, Regino Schultze    . Chronic back pain    Current Outpatient Prescriptions  Medication Sig Dispense Refill   . medroxyPROGESTERone (DEPO-PROVERA) 150 MG/ML injection Inject 1 mL (150 mg total) into the muscle once.  1 mL  0  . meloxicam (MOBIC) 7.5 MG tablet Take 1 tablet (7.5 mg total) by mouth daily.  30 tablet  2  . oxyCODONE-acetaminophen (ROXICET) 5-325 MG per tablet Take 1 tablet by mouth every 6 (six) hours as needed.  30 tablet  0   No current facility-administered medications for this visit.   Family History  Problem Relation Age of Onset  . Diabetes Mother   . Hypertension Mother   . Hypertension Father   . Heart attack Maternal Grandfather   . Hypertension Maternal Grandfather   . Heart attack Paternal Grandfather   . Hypertension Paternal Grandfather    History   Social History  . Marital Status: Single    Spouse Name: N/A    Number of Children: N/A  . Years of Education: N/A   Social History Main Topics  . Smoking status: Current Every Day Smoker -- 0.50 packs/day    Types: Cigarettes  . Smokeless tobacco: Not on file     Comment: States increased pain./ 2 CIGARETTES PER DAY  . Alcohol Use: No  . Drug Use: No     Comment: previoulsly.  [name] Sexual Activity: Not Currently   Other Topics Concern  . Not on file   Social History Narrative  . No narrative on file   Review of Systems: Review of  Systems  Constitutional: Positive for weight loss (unintentional). Negative for fever and chills.  Respiratory: Negative for cough and shortness of breath.   Cardiovascular: Negative for chest pain and leg swelling.  Gastrointestinal: Positive for nausea (with narcotic usage). Negative for vomiting, abdominal pain, diarrhea and constipation.       Loose stools with possible incontinence   Genitourinary: Negative for dysuria, urgency, frequency and hematuria.  Musculoskeletal: Positive for back pain and falls (once since last visit).  Neurological: Positive for sensory change (left sciatica) and focal weakness (chronic bilateral LE ).  Endo/Heme/Allergies:       Feeling cold     Objective:  Physical Exam: Filed Vitals:   12/22/13 1020 12/22/13 1021  BP:  115/80  Pulse:  128  Temp:  98.5 F (36.9 C)  TempSrc:  Oral  Height: 5' 6.5" (1.689 m)   Weight: 140 lb 9.6 oz (63.776 kg)   SpO2:  99%    Physical Exam  Constitutional: She is oriented to person, place, and time. She appears well-developed and well-nourished.  Standing up leaning on table due to pain  HENT:  Head: Normocephalic and atraumatic.  Eyes: EOM are normal.  Neck: Normal range of motion. Neck supple.  Cardiovascular: Regular rhythm.   Tachycardia   Pulmonary/Chest: Effort normal and breath sounds normal. No respiratory distress. She has no wheezes. She has no rales.  Abdominal: Soft. Bowel sounds are normal. She exhibits no distension. There is no tenderness. There is no rebound and no guarding.  Musculoskeletal: She exhibits tenderness. She exhibits no edema.  Decreased ROM of lumbar spine with paraspinal tenderness (L>R).   Neurological: She is alert and oriented to person, place, and time.  Ambulates with cane. 4/5 muscle strength in left LE, otherwise 5/5 throughout. Decreased sensation to light touch of left LE.  Skin: Skin is warm and dry. No rash noted. No erythema. No pallor.  Psychiatric: She has a normal mood and affect. Her behavior is normal. Judgment and thought content normal.    Assessment & Plan:   Please see problem list for problem-based assessment and plan

## 2013-12-23 NOTE — Progress Notes (Signed)
Internal Medicine Clinic Attending Date of visit: 12/22/2013   Case discussed with Dr. Naaman Plummer soon after the resident saw the patient.  We reviewed the resident's history and exam and pertinent patient test results.  I agree with the assessment, diagnosis, and plan of care documented in the resident's note.  There is little evidence that steroids are useful in the setting of chronic back pain.  There may be some benefit in acute back pain.  There is evidence that patients feel better with the medication, but not that their disease is actually improved.  Would recommend no further steroid courses for this patient until some definitive imaging can be done.  I am concerned with her worsening symptoms that she may have changes on imaging that would warrant a surgical evaluation and steroids could possibly be masking changes in her symptoms.  Would recommend referring for an MRI and assisting the patient with procuring insurance in the near future.  Would ask Dr. Naaman Plummer to place a SW consult for assistant in obtaining insurance.

## 2013-12-26 ENCOUNTER — Other Ambulatory Visit: Payer: Self-pay | Admitting: *Deleted

## 2013-12-26 MED ORDER — OXYCODONE-ACETAMINOPHEN 7.5-325 MG PO TABS: 1.0000 | ORAL_TABLET | Freq: Two times a day (BID) | ORAL | Status: DC | PRN

## 2013-12-26 NOTE — Addendum Note (Signed)
Addended byJuluis Mire on: 12/26/2013 04:10 PM   Modules accepted: Orders, Medications

## 2014-01-08 ENCOUNTER — Ambulatory Visit (INDEPENDENT_AMBULATORY_CARE_PROVIDER_SITE_OTHER): Payer: No Typology Code available for payment source | Admitting: Internal Medicine

## 2014-01-08 ENCOUNTER — Ambulatory Visit (HOSPITAL_COMMUNITY)
Admission: RE | Admit: 2014-01-08 | Discharge: 2014-01-08 | Disposition: A | Discharge status: Home / Self Care | Payer: No Typology Code available for payment source | Source: Ambulatory Visit | Attending: Internal Medicine | Admitting: Internal Medicine

## 2014-01-08 ENCOUNTER — Encounter: Payer: Self-pay | Admitting: Internal Medicine

## 2014-01-08 VITALS — BP 127/86 | HR 108 | Temp 98.5°F | Wt 141.3 lb

## 2014-01-08 DIAGNOSIS — R000 Tachycardia, unspecified: Secondary | ICD-10-CM

## 2014-01-08 DIAGNOSIS — M5116 Intervertebral disc disorders with radiculopathy, lumbar region: Secondary | ICD-10-CM

## 2014-01-08 DIAGNOSIS — M5126 Other intervertebral disc displacement, lumbar region: Secondary | ICD-10-CM

## 2014-01-08 LAB — BASIC METABOLIC PANEL WITH GFR
BUN: 7 mg/dL (ref 6–23)
CO2: 25 meq/L (ref 19–32)
Calcium: 9.9 mg/dL (ref 8.4–10.5)
Chloride: 104 mEq/L (ref 96–112)
Creat: 0.85 mg/dL (ref 0.50–1.10)
GFR, Est Non African American: 85 mL/min
Glucose, Bld: 77 mg/dL (ref 70–99)
Potassium: 4 mEq/L (ref 3.5–5.3)
SODIUM: 137 meq/L (ref 135–145)

## 2014-01-08 MED ORDER — MORPHINE SULFATE ER 15 MG PO TBCR: 15.0000 mg | EXTENDED_RELEASE_TABLET | Freq: Two times a day (BID) | ORAL | Status: DC

## 2014-01-08 MED ORDER — MELOXICAM 7.5 MG PO TABS: 7.5000 mg | ORAL_TABLET | Freq: Every day | ORAL | Status: DC

## 2014-01-08 MED ORDER — OXYCODONE-ACETAMINOPHEN 7.5-325 MG PO TABS: 1.0000 | ORAL_TABLET | Freq: Four times a day (QID) | ORAL | Status: DC | PRN

## 2014-01-08 MED ORDER — KETOROLAC TROMETHAMINE 60 MG/2ML IM SOLN: 60.0000 mg | Freq: Once | INTRAMUSCULAR | Status: DC

## 2014-01-08 NOTE — Assessment & Plan Note (Signed)
Has chronic tachycardia since 2012. Pain is likely causing her tachycardia. Ordered TSH. EKG today shows sinus tachycardia. Monitor for now with pain control.  Will do ECHO in the future to evaluate for cardiomyopathy from tachycardia or other structural abnormalities.

## 2014-01-08 NOTE — Assessment & Plan Note (Signed)
Patient's pain is not controlled at all with percocet every 12 hours. Has bowel incontinence, weakness on left leg to the point that she is falling. Has severe constant pain radiating from her back to her left leg. Pain is limiting her life severely.  Added MS contin 15mg  q12hr + also filled percocet 7-325 for q6hr PRN for breakthrough pain. Refilled maloxicam.  MRI lumbar spine ordered today. Will get toradol 60mg  IM injection before MRI.   Will need to see neurosurgery as soon as she can afford it. PT hasn't helped in the past. Epidural injection will not be useful as it only helps for few days.

## 2014-01-08 NOTE — Patient Instructions (Signed)
Take morphine extended release 1 tablet every 12 hours. If you still have pain, you can take the oxycodone-tylenol 7.5.-325mg  every 6 hours as needed.  We will get your lumbar spine MRI. You will need to see a surgeon as soon as you can.  Follow up in 1 month.

## 2014-01-08 NOTE — Progress Notes (Signed)
Subjective:    Patient ID: Andrea [name], female    DOB: [birth year], 43 y.o.   MRN: [id number]  Back Pain Associated symptoms include numbness and weakness. Pertinent negatives include no chest pain, dysuria, fever or headaches.    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 Here for follow up of back pain. Has severe pain. Oxycodone 7.5mg  is not helping at all so she has been taking 2-3 tablets every 12 hour instead of taking 1 tablet every 12 hour. Still she is in lot of pain. Doesn't want to get addicted to pain meds but cannot tolerate the pain. She is feeling weak on the left leg, has been falling 4-5 time in the last few days from the weakness. Has constant sharp/shooting pain on her back both sides radiating to left leg. Has numbness/tingling on left leg. Cannot walk or get out of the house because of pain. She also having bowel incontinence. Has to sit in the bathroom when she passes gas otherwise she has accidents.  Had MRI 2012 showing L5-S1 DJD with disk bulging/protrusion, stenosis,  and annular tear.  Was supposed to have surgery consultation but could not get it because of insurance issues. Has been waiting for her disability processing since 2012. She was denied but appealed with a lawyer recently.  Did receive epidural injection in the past for the pain, helped for 2-3 days only. PT didn't help but made her pain worse.   Her pain medicine was changed from oxycodone-tylenol 5-325 to 7-325mg  BID PRN pain, along with continuation of maloxicam 7.5mg  qdaily. She also had tachycardia on last visit and wanted to get EKG and TSH today instead of last visit.   Denies any sob, cp, cough, n/v, fever, chills, headache, numbness, weakness, tingling.   Review of Systems  Constitutional: Positive for activity change. Negative for fever, chills, appetite change and fatigue.  HENT: Negative for  congestion, drooling, ear discharge, ear pain, nosebleeds, postnasal drip, rhinorrhea, sinus pressure and sore throat.   Eyes: Negative for pain, discharge and visual disturbance.  Respiratory: Negative for cough, chest tightness, shortness of breath and wheezing.   Cardiovascular: Negative for chest pain, palpitations and leg swelling.  Gastrointestinal: Negative for nausea, diarrhea, constipation, abdominal distention and anal bleeding.  Genitourinary: Negative for dysuria, urgency, hematuria, decreased urine volume, vaginal bleeding, vaginal discharge, difficulty urinating and vaginal pain.  Musculoskeletal: Positive for back pain, gait problem and myalgias. Negative for arthralgias, joint swelling, neck pain and neck stiffness.  Skin: Negative.   Allergic/Immunologic: Negative.   Neurological: Positive for weakness and numbness. Negative for dizziness, seizures, syncope, speech difficulty, light-headedness and headaches.  Hematological: Negative.   Psychiatric/Behavioral: Negative.        Objective:   Physical Exam  Constitutional: She is oriented to person, place, and time. She appears well-developed and well-nourished. She appears distressed.  Sitting by tilting to the right side because of pain.  HENT:  Head: Normocephalic and atraumatic.  Right Ear: External ear normal.  Left Ear: External ear normal.  Nose: Nose normal.  Mouth/Throat: Oropharynx is clear and moist.  Eyes: Conjunctivae and EOM are normal. Pupils are equal, round, and reactive to light. Right eye exhibits no discharge. Left eye exhibits no discharge. No scleral icterus.  Neck: Normal range of motion. Neck supple. No JVD present. No thyromegaly present.  Cardiovascular: Normal rate, regular rhythm, S1 normal, S2 normal, normal heart sounds  and intact distal pulses.  Exam reveals no gallop and no friction rub.   No murmur heard. Pulmonary/Chest: Effort normal and breath sounds normal. No respiratory distress. She  has no wheezes. She has no rales. She exhibits no tenderness.  Abdominal: Soft. Bowel sounds are normal. She exhibits no distension and no mass. There is no tenderness. There is no rebound and no guarding.  Musculoskeletal: Normal range of motion. She exhibits no edema and no tenderness.  Low back TTP bilaterally.  Lymphadenopathy:    She has no cervical adenopathy.  Neurological: She is alert and oriented to person, place, and time. She has normal strength and normal reflexes. No cranial nerve deficit or sensory deficit. GCS eye subscore is 4. GCS verbal subscore is 5. GCS motor subscore is 6.  5/5 strength on all extremities except left lower extremity 4/5. Sensation is intact on left. ROM limited due to pain on left leg/hip.    Skin: No rash noted. She is not diaphoretic. No erythema. No pallor.  Psychiatric: She has a normal mood and affect. Her behavior is normal.        Assessment & Plan:  See problem based a&p.

## 2014-01-09 LAB — TSH: TSH: 1.991 u[IU]/mL (ref 0.350–4.500)

## 2014-01-09 NOTE — Progress Notes (Signed)
INTERNAL MEDICINE TEACHING ATTENDING ADDENDUM - Aldine Contes, MD: I personally saw and evaluated Ms. Starace in this clinic visit in conjunction with the resident, Dr. Genene Churn. I have discussed patient's plan of care with medical resident during this visit. I have confirmed the physical exam findings and have read and agree with the clinic note including the plan with the following addition: - Pt with persistent tachycardia - possibly secondary to pain currently but with h/o tach since 2012 - Will need ECHO in future to r/o tachycardia induced CM - Started MS contin for back pain with percocet prn for breakthough - Check repeat MRI

## 2014-01-15 NOTE — Telephone Encounter (Signed)
Chart open in error

## 2014-01-22 ENCOUNTER — Ambulatory Visit (HOSPITAL_COMMUNITY)
Admission: RE | Admit: 2014-01-22 | Discharge: 2014-01-22 | Disposition: A | Discharge status: Home / Self Care | Payer: Medicaid Other | Source: Ambulatory Visit | Attending: Internal Medicine | Admitting: Internal Medicine

## 2014-01-22 ENCOUNTER — Ambulatory Visit (INDEPENDENT_AMBULATORY_CARE_PROVIDER_SITE_OTHER): Payer: No Typology Code available for payment source | Admitting: *Deleted

## 2014-01-22 DIAGNOSIS — M5116 Intervertebral disc disorders with radiculopathy, lumbar region: Secondary | ICD-10-CM

## 2014-01-22 DIAGNOSIS — M549 Dorsalgia, unspecified: Secondary | ICD-10-CM | POA: Diagnosis present

## 2014-01-22 DIAGNOSIS — M5117 Intervertebral disc disorders with radiculopathy, lumbosacral region: Secondary | ICD-10-CM | POA: Insufficient documentation

## 2014-01-22 MED ORDER — KETOROLAC TROMETHAMINE 30 MG/ML IJ SOLN
60.0000 mg | Freq: Once | INTRAMUSCULAR | Status: AC
  Administered 2014-01-22: 60 mg via INTRAMUSCULAR

## 2014-02-02 ENCOUNTER — Ambulatory Visit (INDEPENDENT_AMBULATORY_CARE_PROVIDER_SITE_OTHER): Payer: Self-pay | Admitting: *Deleted

## 2014-02-02 DIAGNOSIS — Z3042 Encounter for surveillance of injectable contraceptive: Secondary | ICD-10-CM

## 2014-02-02 MED ORDER — MEDROXYPROGESTERONE ACETATE 150 MG/ML IM SUSP
150.0000 mg | Freq: Once | INTRAMUSCULAR | Status: AC
  Administered 2014-02-02: 150 mg via INTRAMUSCULAR

## 2014-02-02 NOTE — Progress Notes (Signed)
Patient ID: Andrea [name], female   DOB: 01/20/1971, 43 y.o.   MRN: [id number] Next date Apr 26, 2014.

## 2014-02-09 ENCOUNTER — Other Ambulatory Visit: Payer: Self-pay | Admitting: *Deleted

## 2014-02-09 DIAGNOSIS — M5116 Intervertebral disc disorders with radiculopathy, lumbar region: Secondary | ICD-10-CM

## 2014-02-09 NOTE — Telephone Encounter (Signed)
Requesting more than 1 printed rx. Thanks

## 2014-02-10 ENCOUNTER — Other Ambulatory Visit: Payer: Self-pay | Admitting: *Deleted

## 2014-02-10 ENCOUNTER — Other Ambulatory Visit: Payer: Self-pay | Admitting: Internal Medicine

## 2014-02-10 DIAGNOSIS — M5116 Intervertebral disc disorders with radiculopathy, lumbar region: Secondary | ICD-10-CM

## 2014-02-10 MED ORDER — OXYCODONE-ACETAMINOPHEN 7.5-325 MG PO TABS: 1.0000 | ORAL_TABLET | Freq: Four times a day (QID) | ORAL | Status: DC | PRN

## 2014-02-10 NOTE — Telephone Encounter (Signed)
Pt states she has been taking Percocet 4 - 6 hrs and having it filled at [facility] on a regular basis. And take MS Contin 1 tab (sometimes 2 tabs if the pain is really bad).

## 2014-02-12 MED ORDER — OXYCODONE-ACETAMINOPHEN 7.5-325 MG PO TABS: 1.0000 | ORAL_TABLET | Freq: Four times a day (QID) | ORAL | Status: DC | PRN

## 2014-02-12 NOTE — Telephone Encounter (Signed)
Rx ready - pt called; message left.

## 2014-02-18 ENCOUNTER — Ambulatory Visit: Payer: Self-pay

## 2014-03-31 ENCOUNTER — Other Ambulatory Visit: Payer: Self-pay | Admitting: *Deleted

## 2014-03-31 DIAGNOSIS — M5116 Intervertebral disc disorders with radiculopathy, lumbar region: Secondary | ICD-10-CM

## 2014-04-01 ENCOUNTER — Encounter: Payer: Self-pay | Admitting: Internal Medicine

## 2014-04-01 ENCOUNTER — Ambulatory Visit (INDEPENDENT_AMBULATORY_CARE_PROVIDER_SITE_OTHER): Payer: Self-pay | Admitting: Internal Medicine

## 2014-04-01 ENCOUNTER — Telehealth: Payer: Self-pay | Admitting: Licensed Clinical Social Worker

## 2014-04-01 VITALS — BP 122/74 | HR 100 | Temp 97.9°F | Ht 66.5 in | Wt 150.8 lb

## 2014-04-01 DIAGNOSIS — M5116 Intervertebral disc disorders with radiculopathy, lumbar region: Secondary | ICD-10-CM

## 2014-04-01 MED ORDER — MORPHINE SULFATE ER 15 MG PO TBCR: 15.0000 mg | EXTENDED_RELEASE_TABLET | Freq: Two times a day (BID) | ORAL | Status: DC

## 2014-04-01 MED ORDER — KETOROLAC TROMETHAMINE 30 MG/ML IM SOLN: 60.0000 mg | Freq: Once | INTRAMUSCULAR | Status: DC

## 2014-04-01 MED ORDER — KETOROLAC TROMETHAMINE 30 MG/ML IJ SOLN: 30.0000 mg | Freq: Once | INTRAMUSCULAR | Status: DC

## 2014-04-01 MED ORDER — KETOROLAC TROMETHAMINE 30 MG/ML IM SOLN: 30.0000 mg | Freq: Once | INTRAMUSCULAR | Status: DC

## 2014-04-01 MED ORDER — KETOROLAC TROMETHAMINE 30 MG/ML IJ SOLN
30.0000 mg | Freq: Once | INTRAMUSCULAR | Status: DC
  Administered 2014-04-01: 60 mg via INTRAMUSCULAR

## 2014-04-01 MED ORDER — KETOROLAC TROMETHAMINE 60 MG/2ML IM SOLN: 60.0000 mg | Freq: Once | INTRAMUSCULAR | Status: DC

## 2014-04-01 MED ORDER — OXYCODONE-ACETAMINOPHEN 7.5-325 MG PO TABS: 1.0000 | ORAL_TABLET | Freq: Four times a day (QID) | ORAL | Status: DC | PRN

## 2014-04-01 NOTE — Assessment & Plan Note (Addendum)
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 2. Mild right subarticular stenosis at L5-S1.  Pt has tried conservative management with NSAIDS, antidepressants, Physical therapy, muscle relaxants, gabapentin, Tramadol, oral steroids, and now Oxycodone and Ms Contin.  Pt still persistent. HAs no insurance. She has not been able to follow up with a neurosurgeon. As she has no income and no insurance.   Pt today is in significant pain, with complaint of fecal incontinence, numbness down the left leg.   Plan- IM Toradol 60mg  here in clinic, pt says this has provided some relief in the past. Pt had some relief after toradol shot. - Called neurosurgery Clinic in [place]  For Evaluation, to try to schedule pt with an appointment, considering findings and physical exam, pt will likely need surgery. - Prescribed Ms Contin 15mg  BID - Percocets 7- 325mg  Q6H as needed, 330 tabs.  Addendum- Got a call back from the neurosurgeon, patient to come in for appointment today 12.30pm pt should be there. Fortunately pt was still in the clinic waiting room. Stressed the importance of making this appointment. Pt voiced understanding and left immediately for the appointment from clinic.  - Appreciate neurosurgery agreeing to see this patient.

## 2014-04-01 NOTE — Patient Instructions (Addendum)
General Instructions: We will be prescribing your pain meds. We will also send you to a neurosurgeon.    Please bring your medicines with you each time you come to clinic.  Medicines may include prescription medications, over-the-counter medications, herbal remedies, eye drops, vitamins, or other pills.   We will let you know when we are able to Schedule an appointment for you with the neurosurgeon, hopefully we can get some answers for you today. This appointment may be at [facility] in [place].

## 2014-04-01 NOTE — Progress Notes (Signed)
Patient ID: Andrea [name], female   DOB: 1970/10/03, 43 y.o.   MRN: [id number]   Subjective:   Patient ID: Andrea [name] female   DOB: 31-Jan-1971 43 y.o.   MRN: [id number]  HPI: [name] is a 43 y.o. with PMH listed below. Presented today with complaints of back pain since 2012 ( But initally started in 2010 for a while and then it stopped), but has been getting progressively worse since August. 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 Also pt says for over  A week she has not been getting up from the bed at all. Says the pain meds put her to sleep, and dont take away the pain.  Pt is having pain in both sides of her hip now, she now bends over to tale some of the pressure of her back. No fever, pain keeps her awake at night, is present all the time. Has had fecal incontinence in the past, but in the past 4 days, and say she sometimes passes urine on her self. Has constant tingling and numbness down the left leg.  No recent aggravating factors. Pt has not been working, she has filed for disability. 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 She couldn't work >20hrs a week because of her back pain.  Past Medical History  Diagnosis Date  . Low back pain radiating to left leg 11/29/2010  . Dysmenorrhea 12/06/2009    Qualifier: Diagnosis of  By: [name] RN, Regino Schultze    . Chronic back pain    Current Outpatient Prescriptions  Medication Sig Dispense Refill  . medroxyPROGESTERone (DEPO-PROVERA) 150 MG/ML injection Inject 1 mL (150 mg total) into the muscle once. 1 mL 0  . meloxicam (MOBIC) 7.5 MG tablet Take 1 tablet (7.5 mg total) by mouth daily. 30 tablet 2  . methylPREDNIsolone (MEDROL DOSPACK) 4 MG tablet follow package directions 21 tablet 0  . morphine (MS CONTIN) 15 MG 12 hr tablet Take 1 tablet (15 mg total) by mouth every 12 (twelve) hours. 60 tablet 0  . ondansetron (ZOFRAN) 4 MG tablet Take  1 tablet (4 mg total) by mouth every 8 (eight) hours as needed for nausea or vomiting. 20 tablet 0  . oxyCODONE-acetaminophen (PERCOCET) 7.5-325 MG per tablet Take 1 tablet by mouth every 6 (six) hours as needed for pain. 30 tablet 0   No current facility-administered medications for this visit.   Family History  Problem Relation Age of Onset  . Diabetes Mother   . Hypertension Mother   . Hypertension Father   . Heart attack Maternal Grandfather   . Hypertension Maternal Grandfather   . Heart attack Paternal Grandfather   . Hypertension Paternal Grandfather    History   Social History  . Marital Status: Single    Spouse Name: N/A    Number of Children: N/A  . Years of Education: N/A   Social History Main Topics  . Smoking status: Current Every Day Smoker -- 0.40 packs/day    Types: Cigarettes  . Smokeless tobacco: Not on file     Comment: States increased pain./ 2 CIGARETTES PER DAY  . Alcohol Use: No  . Drug Use: No     Comment: previoulsly.  [name] Sexual Activity: Not Currently   Other Topics Concern  . Not on file   Social History Narrative  . No narrative on file  Review of Systems: CONSTITUTIONAL- No Fever, weightloss, night sweat or change in appetite. SKIN- No Rash, colour changes or itching. HEAD- No Headache or dizziness. Mouth/throat- No Sorethroat, dentures, or bleeding gums. RESPIRATORY- No Cough or SOB. CARDIAC- No Palpitations, DOE, PND or chest pain. GI- No nausea, 1 episode of vomiting 3 days ago, non since then, without diarrhoea, constipation, abd pain. [facility]- She is depressed because of her pain.  Objective:  Physical Exam: Filed Vitals:   04/01/14 0934  BP: 122/74  Pulse: 100  Temp: 97.9 F (36.6 C)  TempSrc: Oral  Height: 5' 6.5" (1.689 m)  Weight: 150 lb 12.8 oz (68.402 kg)  SpO2: 100%   GENERAL- alert, co-operative, appears as stated age, in moderate pain. HEENT- Atraumatic, normocephalic, PERRL, EOMI, neck supple. CARDIAC- RRR, no  murmurs, rubs or gallops. RESP- Moving equal volumes of air, and clear to auscultation bilaterally, no wheezes or crackles. ABDOMEN- Soft, nontender, no guarding or rebound, no palpable masses or organomegaly, bowel sounds present. BACK- Pt is bent over, walking with one crutch, tenderness along the vertebrae- mid to lower lumber spine, tenderness also spreading bilaterally to both sides of the hip.  NEURO- Exam limited as pt is in pain- No obvious Cr N abnormality, Patient can not sit down or lie down for me to check the strenght in her legs, bent forward on one crutch, she stood through out interview. Sensation reduced on the left, normal right.  EXTREMITIES- Warm and well perfused, no pedal edema. SKIN- Warm, dry, No rash or lesion. PSYCH- Very tearful.   Assessment & Plan:  The patient's case and plan of care was discussed with attending physician, Dr. Beryle Beams.  Please see problem based charting for assessment and plan.

## 2014-04-02 ENCOUNTER — Encounter: Payer: Self-pay | Admitting: Licensed Clinical Social Worker

## 2014-04-02 NOTE — Progress Notes (Signed)
Medicine attending: Medical history, presenting problems, physical findings, and medications, reviewed with resident physician Dr. Denton Brick on the day of the patient's visit and I concur with her evaluation and management plan.

## 2014-04-02 NOTE — Telephone Encounter (Signed)
Andrea [name] was referred to [facility] via financial counselor.  Pt currently has her [place] and indicated transportation was an issue.  Pt completed the [employer] transportation and was faxed to [place] on 04/01/14.  CSW placed call to Andrea [name] to discuss free transportation.  This phone call was quick, as pt was making her way to neurosurgeon per physician recommendation.  Pt's phone lost signal.

## 2014-04-03 NOTE — Telephone Encounter (Signed)
Both Rx were done 04/01/14 during visit in clinic with Dr Denton Brick. [name] [name] RN 04/03/14 10:45PM

## 2014-04-09 NOTE — Telephone Encounter (Signed)
Pt returned call.  CSW discussed [employer] transportation and mailed informational brochure to Ms. Linhares.  Pt denies add'l social work needs at this time.  CSW will sign off.

## 2014-04-23 NOTE — Pre-Procedure Instructions (Signed)
Andrea [name]  04/23/2014   Your procedure is scheduled on:  Friday, January 8th  Report to [facility] Admitting at 530 AM.  Call this number if you have problems the morning of surgery: [phone]   Remember:   Do not eat food or drink liquids after midnight.   Take these medicines the morning of surgery with A SIP OF WATER: morphine, percocet if needed, zofran if needed   Do not wear jewelry, make-up or nail polish.  Do not wear lotions, powders, or perfumes,deodorant.  Do not shave 48 hours prior to surgery. Men may shave face and neck.  Do not bring valuables to the hospital.  [facility] is not responsible for any belongings or valuables.               Contacts, dentures or bridgework may not be worn into surgery.  Leave suitcase in the car. After surgery it may be brought to your room.  For patients admitted to the hospital, discharge time is determined by your  treatment team.               Patients discharged the day of surgery will not be allowed to drive home.  Please read over the following fact sheets that you were given: Pain Booklet, Coughing and Deep Breathing, MRSA Information and Surgical Site Infection Prevention  [facility] - Preparing for Surgery  Before surgery, you can play an important role.  Because skin is not sterile, your skin needs to be as free of germs as possible.  You can reduce the number of germs on you skin by washing with CHG (chlorahexidine gluconate) soap before surgery.  CHG is an antiseptic cleaner which kills germs and bonds with the skin to continue killing germs even after washing.  Please DO NOT use if you have an allergy to CHG or antibacterial soaps.  If your skin becomes reddened/irritated stop using the CHG and inform your nurse when you arrive at Short Stay.  Do not shave (including legs and underarms) for at least 48 hours prior to the first CHG shower.  You may shave your face.  Please follow these instructions  carefully:   1.  Shower with CHG Soap the night before surgery and the morning of Surgery.  2.  If you choose to wash your hair, wash your hair first as usual with your normal shampoo.  3.  After you shampoo, rinse your hair and body thoroughly to remove the shampoo.  4.  Use CHG as you would any other liquid soap.  You can apply CHG directly to the skin and wash gently with scrungie or a clean washcloth.  5.  Apply the CHG Soap to your body ONLY FROM THE NECK DOWN.  Do not use on open wounds or open sores.  Avoid contact with your eyes, ears, mouth and genitals (private parts).  Wash genitals (private parts) with your normal soap.  6.  Wash thoroughly, paying special attention to the area where your surgery will be performed.  7.  Thoroughly rinse your body with warm water from the neck down.  8.  DO NOT shower/wash with your normal soap after using and rinsing off the CHG Soap.  9.  Pat yourself dry with a clean towel.            10.  Wear clean pajamas.            11.  Place clean sheets on your bed the night  of your first shower and do not sleep with pets.  Day of Surgery  Do not apply any lotions/deoderants the morning of surgery.  Please wear clean clothes to the hospital/surgery center.

## 2014-04-27 ENCOUNTER — Encounter (HOSPITAL_COMMUNITY): Payer: Self-pay

## 2014-04-27 ENCOUNTER — Other Ambulatory Visit: Payer: Self-pay

## 2014-04-27 ENCOUNTER — Encounter (HOSPITAL_COMMUNITY)
Admission: RE | Admit: 2014-04-27 | Discharge: 2014-04-27 | Disposition: A | Discharge status: Home / Self Care | Payer: Medicaid Other | Source: Ambulatory Visit | Attending: Neurological Surgery | Admitting: Neurological Surgery

## 2014-04-27 ENCOUNTER — Telehealth: Payer: Self-pay | Admitting: Licensed Clinical Social Worker

## 2014-04-27 ENCOUNTER — Ambulatory Visit (INDEPENDENT_AMBULATORY_CARE_PROVIDER_SITE_OTHER): Payer: Self-pay | Admitting: *Deleted

## 2014-04-27 DIAGNOSIS — R0609 Other forms of dyspnea: Secondary | ICD-10-CM | POA: Insufficient documentation

## 2014-04-27 DIAGNOSIS — F329 Major depressive disorder, single episode, unspecified: Secondary | ICD-10-CM | POA: Diagnosis not present

## 2014-04-27 DIAGNOSIS — R51 Headache: Secondary | ICD-10-CM | POA: Diagnosis not present

## 2014-04-27 DIAGNOSIS — I498 Other specified cardiac arrhythmias: Secondary | ICD-10-CM | POA: Insufficient documentation

## 2014-04-27 DIAGNOSIS — Z79891 Long term (current) use of opiate analgesic: Secondary | ICD-10-CM | POA: Insufficient documentation

## 2014-04-27 DIAGNOSIS — M5127 Other intervertebral disc displacement, lumbosacral region: Secondary | ICD-10-CM | POA: Diagnosis not present

## 2014-04-27 DIAGNOSIS — R079 Chest pain, unspecified: Secondary | ICD-10-CM | POA: Insufficient documentation

## 2014-04-27 DIAGNOSIS — Z590 Homelessness: Secondary | ICD-10-CM | POA: Insufficient documentation

## 2014-04-27 DIAGNOSIS — N946 Dysmenorrhea, unspecified: Secondary | ICD-10-CM | POA: Diagnosis not present

## 2014-04-27 DIAGNOSIS — F17210 Nicotine dependence, cigarettes, uncomplicated: Secondary | ICD-10-CM | POA: Insufficient documentation

## 2014-04-27 DIAGNOSIS — G8929 Other chronic pain: Secondary | ICD-10-CM | POA: Insufficient documentation

## 2014-04-27 DIAGNOSIS — Z79899 Other long term (current) drug therapy: Secondary | ICD-10-CM | POA: Insufficient documentation

## 2014-04-27 DIAGNOSIS — M47897 Other spondylosis, lumbosacral region: Secondary | ICD-10-CM | POA: Insufficient documentation

## 2014-04-27 DIAGNOSIS — F419 Anxiety disorder, unspecified: Secondary | ICD-10-CM | POA: Diagnosis not present

## 2014-04-27 DIAGNOSIS — M549 Dorsalgia, unspecified: Secondary | ICD-10-CM | POA: Insufficient documentation

## 2014-04-27 DIAGNOSIS — M79605 Pain in left leg: Secondary | ICD-10-CM | POA: Diagnosis not present

## 2014-04-27 DIAGNOSIS — Z01818 Encounter for other preprocedural examination: Secondary | ICD-10-CM | POA: Diagnosis not present

## 2014-04-27 DIAGNOSIS — Z3042 Encounter for surveillance of injectable contraceptive: Secondary | ICD-10-CM

## 2014-04-27 HISTORY — DX: Major depressive disorder, single episode, unspecified: F32.9

## 2014-04-27 HISTORY — DX: Depression, unspecified: F32.A

## 2014-04-27 HISTORY — DX: Reserved for inherently not codable concepts without codable children: IMO0001

## 2014-04-27 HISTORY — DX: Family history of other specified conditions: Z84.89

## 2014-04-27 HISTORY — DX: Headache: R51

## 2014-04-27 HISTORY — DX: Anxiety disorder, unspecified: F41.9

## 2014-04-27 HISTORY — DX: Angina pectoris, unspecified: I20.9

## 2014-04-27 HISTORY — DX: Headache, unspecified: R51.9

## 2014-04-27 LAB — CBC
HCT: 40.9 % (ref 36.0–46.0)
Hemoglobin: 13.8 g/dL (ref 12.0–15.0)
MCH: 32.6 pg (ref 26.0–34.0)
MCHC: 33.7 g/dL (ref 30.0–36.0)
MCV: 96.7 fL (ref 78.0–100.0)
Platelets: 285 10*3/uL (ref 150–400)
RBC: 4.23 MIL/uL (ref 3.87–5.11)
RDW: 13.5 % (ref 11.5–15.5)
WBC: 7.2 10*3/uL (ref 4.0–10.5)

## 2014-04-27 LAB — SURGICAL PCR SCREEN
MRSA, PCR: NEGATIVE
Staphylococcus aureus: NEGATIVE

## 2014-04-27 LAB — HCG, SERUM, QUALITATIVE: Preg, Serum: NEGATIVE

## 2014-04-27 MED ORDER — MEDROXYPROGESTERONE ACETATE 150 MG/ML IM SUSP
150.0000 mg | Freq: Once | INTRAMUSCULAR | Status: AC
  Administered 2014-04-27: 150 mg via INTRAMUSCULAR

## 2014-04-27 NOTE — Telephone Encounter (Signed)
Andrea [name] had stopped by [facility] today to inquire about her [facility].  CSW was out of the office at the time.  CSW placed call to Andrea [name] and informed her CSW received call from [facility] but office was closed.  Andrea [name] states [employer] was unable to schedule any transportation for her last week when she called.  Andrea [name] was told her application was not received.  CSW returned call to [name] and notified Andrea [name] application was faxed and fax confirmation on hand for 04/01/14.  CSW informed Andrea [name], CSW will contact [employer] [employer] [place]) to confirm.  Email sent to [employer] to confirm application receipt from original fax on 04/01/14 or if CSW needed to refax.  Pt has transportation set up for her upcoming surgery.

## 2014-04-27 NOTE — Progress Notes (Signed)
Anesthesia PAT Evaluation:  Patient is a 44 year old female scheduled for L5-S1 microdiscectomy on 05/01/14 by Dr. Ronnald Ramp.  History includes smoking, chronic back pain, exertional dyspnea, anxiety, headaches, depression, dysmenorrhea. PCP is with [facility], last visit 04/01/14 with referral to neurosurgeon with anticipated need for future surgery.  PAT Vitals: 95/57, RR 20, HR 88, O2 sat 97%, T 36.8 C.  Meds listed include Depo-Provera, MS Contin, Zofran. Percocet.  Preoperative labs noted.   EKG 04/23/14 NSR, sinus arrhythmia, cannot rule out anterior infarct (age undetermined). She has an rSr prime pattern in V1, V2 with non-specific T wave abnormality present since her 01/08/14 EKG.  Patient see by PCP on 12/04/13 and 01/08/14 for back pain and tachycardia.  Tachycardia was felt possibly secondary to pain but has been noted dating back to 2012.  PCP note on 01/08/14 mentions ordering an echo in the future to rule out tachycardia induced CM.  TSH was WNL.     MRI done 01/22/2014 - Impression: 1. 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 2. Mild right subarticular stenosis at L5-S1.  I saw patient during her PAT visit due to history of chest pain--none during evaluation.  She reported infrequent chest pains in the past (~ 1-2 every 6 months).  She had a pain medication increase ~ 12/2013 and has noticed chest pain ~ 3-4/month since.  Pains are rather sharp and occur in her upper mid chest.  They occur primarily at rest and last only a few seconds. Pain feels like she can't catch her breath and feels better after rubbing her chest. Episodes are more common after periods of stress/high emotion. She has felt particularly stressed due to her living situation (homeless but living with a friend), progressive back and left leg pain, and emotionally charged confrontations with her  mother who has Bipolar disorder.  Last episode was 04/14/14 following an argument with her mother. She got nauseated once with symptoms but otherwise no associated symptoms such as SOB, diaphoresis, jaw or arm pain. She has not noticed any symptoms when she has been told that she was tachycardic. She typically smokes ~ 1/2 ppd (but sometimes up to 2 ppd) if she is emotional stressed. She has a intermittent dry cough.  Denies LE edema.  She has chronic exertional dyspnea.  She has had back pain issues since 2011, but progressively worse and had to stop riding a bicycle ~ 1 year ago due to back and leg pain. Her activity level is limited by pain. She was able to walk from the hospital entrance to PAT without CV symptoms. Her mother and maternal grandparents have a history of CAD, but she believes > 66 years old.  She did have one cousin who died of suspected MI or CHF in his 74's, but was not told that he had familial cardiac disease.  On exam, heart RRR, no murmur noted.  Lungs clear.  Patient's chest pain seems atypical due to it lasting only a few seconds and improving with rubbing her chest.  Episodes don't seem to be related to exertion.  She is not tachycardiac today and has no overt CHF symptoms.  No chest pain currently.  Discussed with anesthesiologist Dr. Deatra Canter.  Unless patient develops worsening CV or CHF symptoms would anticipate that she could proceed as planned.  She was encouraged to continue follow-up with her PCP for continued evaluation  of her tachycardia--which she is not today.  If any acute changes she was advised to seek immediate medical evaluation.  George Hugh [facility] Short Stay Center/Anesthesiology Phone [phone] 04/27/2014 5:48 PM

## 2014-04-27 NOTE — Progress Notes (Signed)
Pt. States that she has been having chest pain since September 2015. Has not seen anyone to have evaluated,no previous history of heart problems. Ebony Hail to come and speak with patient.

## 2014-05-01 ENCOUNTER — Ambulatory Visit (HOSPITAL_COMMUNITY): Payer: Medicaid Other

## 2014-05-01 ENCOUNTER — Encounter (HOSPITAL_COMMUNITY)
Admission: RE | Disposition: A | Discharge status: Home / Self Care | Payer: Self-pay | Source: Ambulatory Visit | Attending: Neurological Surgery

## 2014-05-01 ENCOUNTER — Ambulatory Visit (HOSPITAL_COMMUNITY)
Admission: RE | Admit: 2014-05-01 | Discharge: 2014-05-02 | Disposition: A | Discharge status: Home / Self Care | Payer: Medicaid Other | Source: Ambulatory Visit | Attending: Neurological Surgery | Admitting: Neurological Surgery

## 2014-05-01 ENCOUNTER — Ambulatory Visit (HOSPITAL_COMMUNITY): Payer: Medicaid Other | Admitting: Vascular Surgery

## 2014-05-01 ENCOUNTER — Encounter (HOSPITAL_COMMUNITY): Payer: Self-pay | Admitting: *Deleted

## 2014-05-01 ENCOUNTER — Ambulatory Visit (HOSPITAL_COMMUNITY): Payer: Medicaid Other | Admitting: Certified Registered"

## 2014-05-01 DIAGNOSIS — Z886 Allergy status to analgesic agent status: Secondary | ICD-10-CM | POA: Diagnosis not present

## 2014-05-01 DIAGNOSIS — IMO0002 Reserved for concepts with insufficient information to code with codable children: Secondary | ICD-10-CM

## 2014-05-01 DIAGNOSIS — G709 Myoneural disorder, unspecified: Secondary | ICD-10-CM | POA: Diagnosis not present

## 2014-05-01 DIAGNOSIS — F17210 Nicotine dependence, cigarettes, uncomplicated: Secondary | ICD-10-CM | POA: Diagnosis not present

## 2014-05-01 DIAGNOSIS — M5117 Intervertebral disc disorders with radiculopathy, lumbosacral region: Secondary | ICD-10-CM | POA: Diagnosis not present

## 2014-05-01 DIAGNOSIS — F329 Major depressive disorder, single episode, unspecified: Secondary | ICD-10-CM | POA: Insufficient documentation

## 2014-05-01 DIAGNOSIS — F419 Anxiety disorder, unspecified: Secondary | ICD-10-CM | POA: Insufficient documentation

## 2014-05-01 DIAGNOSIS — I209 Angina pectoris, unspecified: Secondary | ICD-10-CM | POA: Insufficient documentation

## 2014-05-01 DIAGNOSIS — M79605 Pain in left leg: Secondary | ICD-10-CM | POA: Diagnosis present

## 2014-05-01 DIAGNOSIS — Z9889 Other specified postprocedural states: Secondary | ICD-10-CM

## 2014-05-01 HISTORY — PX: LUMBAR LAMINECTOMY/DECOMPRESSION MICRODISCECTOMY: SHX5026

## 2014-05-01 SURGERY — LUMBAR LAMINECTOMY/DECOMPRESSION MICRODISCECTOMY 1 LEVEL
Anesthesia: General | Site: Back | Laterality: Left

## 2014-05-01 MED ORDER — ROCURONIUM BROMIDE 100 MG/10ML IV SOLN
INTRAVENOUS | Status: DC | PRN
  Administered 2014-05-01: 30 mg via INTRAVENOUS

## 2014-05-01 MED ORDER — METHOCARBAMOL 1000 MG/10ML IJ SOLN
500.0000 mg | INTRAVENOUS | Status: AC
  Administered 2014-05-01: 500 mg via INTRAVENOUS
  Filled 2014-05-01: qty 5

## 2014-05-01 MED ORDER — MENTHOL 3 MG MT LOZG: 1.0000 | LOZENGE | OROMUCOSAL | Status: DC | PRN

## 2014-05-01 MED ORDER — ONDANSETRON HCL 4 MG/2ML IJ SOLN
INTRAMUSCULAR | Status: DC | PRN
  Administered 2014-05-01 (×2): 4 mg via INTRAVENOUS

## 2014-05-01 MED ORDER — OXYCODONE HCL 5 MG PO TABS
2.5000 mg | ORAL_TABLET | Freq: Four times a day (QID) | ORAL | Status: DC | PRN
  Administered 2014-05-01 (×3): 2.5 mg via ORAL
  Filled 2014-05-01 (×3): qty 1

## 2014-05-01 MED ORDER — CEFAZOLIN SODIUM-DEXTROSE 2-3 GM-% IV SOLR
INTRAVENOUS | Status: DC | PRN
  Administered 2014-05-01: 2 g via INTRAVENOUS

## 2014-05-01 MED ORDER — CEFAZOLIN SODIUM-DEXTROSE 2-3 GM-% IV SOLR
INTRAVENOUS | Status: AC
  Filled 2014-05-01: qty 50

## 2014-05-01 MED ORDER — DEXAMETHASONE SODIUM PHOSPHATE 4 MG/ML IJ SOLN
INTRAMUSCULAR | Status: DC | PRN
  Administered 2014-05-01: 8 mg via INTRAVENOUS

## 2014-05-01 MED ORDER — SODIUM CHLORIDE 0.9 % IJ SOLN: 3.0000 mL | INTRAMUSCULAR | Status: DC | PRN

## 2014-05-01 MED ORDER — ARTIFICIAL TEARS OP OINT
TOPICAL_OINTMENT | OPHTHALMIC | Status: AC
  Filled 2014-05-01: qty 3.5

## 2014-05-01 MED ORDER — DIAZEPAM 5 MG/ML IJ SOLN
2.5000 mg | Freq: Once | INTRAMUSCULAR | Status: AC | PRN
  Administered 2014-05-01: 2.5 mg via INTRAVENOUS

## 2014-05-01 MED ORDER — ROCURONIUM BROMIDE 50 MG/5ML IV SOLN
INTRAVENOUS | Status: AC
  Filled 2014-05-01: qty 1

## 2014-05-01 MED ORDER — BUPIVACAINE HCL (PF) 0.25 % IJ SOLN
INTRAMUSCULAR | Status: DC | PRN
  Administered 2014-05-01: 2 mL

## 2014-05-01 MED ORDER — ONDANSETRON HCL 4 MG/2ML IJ SOLN
4.0000 mg | INTRAMUSCULAR | Status: DC | PRN
  Administered 2014-05-01: 4 mg via INTRAVENOUS
  Filled 2014-05-01: qty 2

## 2014-05-01 MED ORDER — PHENOL 1.4 % MT LIQD: 1.0000 | OROMUCOSAL | Status: DC | PRN

## 2014-05-01 MED ORDER — PROPOFOL 10 MG/ML IV BOLUS
INTRAVENOUS | Status: AC
  Filled 2014-05-01: qty 20

## 2014-05-01 MED ORDER — DIAZEPAM 5 MG/ML IJ SOLN
INTRAMUSCULAR | Status: AC
  Administered 2014-05-01: 2.5 mg via INTRAVENOUS
  Filled 2014-05-01: qty 2

## 2014-05-01 MED ORDER — OXYCODONE-ACETAMINOPHEN 5-325 MG PO TABS
1.0000 | ORAL_TABLET | Freq: Four times a day (QID) | ORAL | Status: DC | PRN
Start: 2014-05-01 — End: 2014-05-02
  Administered 2014-05-01 (×3): 1 via ORAL
  Filled 2014-05-01 (×3): qty 1

## 2014-05-01 MED ORDER — SODIUM CHLORIDE 0.9 % IJ SOLN: 3.0000 mL | Freq: Two times a day (BID) | INTRAMUSCULAR | Status: DC

## 2014-05-01 MED ORDER — ONDANSETRON HCL 4 MG/2ML IJ SOLN
INTRAMUSCULAR | Status: AC
  Filled 2014-05-01: qty 2

## 2014-05-01 MED ORDER — METHOCARBAMOL 500 MG PO TABS
500.0000 mg | ORAL_TABLET | Freq: Four times a day (QID) | ORAL | Status: DC | PRN
  Administered 2014-05-01 (×2): 500 mg via ORAL
  Filled 2014-05-01 (×2): qty 1

## 2014-05-01 MED ORDER — SODIUM CHLORIDE 0.9 % IR SOLN
Status: DC | PRN
  Administered 2014-05-01: 08:00:00

## 2014-05-01 MED ORDER — ARTIFICIAL TEARS OP OINT
TOPICAL_OINTMENT | OPHTHALMIC | Status: DC | PRN
  Administered 2014-05-01: 1 via OPHTHALMIC

## 2014-05-01 MED ORDER — PROPOFOL 10 MG/ML IV BOLUS
INTRAVENOUS | Status: DC | PRN
  Administered 2014-05-01: 200 mg via INTRAVENOUS

## 2014-05-01 MED ORDER — LACTATED RINGERS IV SOLN
INTRAVENOUS | Status: DC | PRN
  Administered 2014-05-01: 07:00:00 via INTRAVENOUS

## 2014-05-01 MED ORDER — METHOCARBAMOL 1000 MG/10ML IJ SOLN
500.0000 mg | Freq: Four times a day (QID) | INTRAVENOUS | Status: DC | PRN
  Filled 2014-05-01: qty 5

## 2014-05-01 MED ORDER — HYDROMORPHONE HCL 1 MG/ML IJ SOLN
INTRAMUSCULAR | Status: AC
  Administered 2014-05-01: 0.5 mg via INTRAVENOUS
  Filled 2014-05-01: qty 1

## 2014-05-01 MED ORDER — OXYCODONE-ACETAMINOPHEN 7.5-325 MG PO TABS: 1.0000 | ORAL_TABLET | Freq: Four times a day (QID) | ORAL | Status: DC | PRN

## 2014-05-01 MED ORDER — NICOTINE 14 MG/24HR TD PT24
14.0000 mg | MEDICATED_PATCH | Freq: Every day | TRANSDERMAL | Status: DC
  Administered 2014-05-01: 14 mg via TRANSDERMAL
  Filled 2014-05-01 (×2): qty 1

## 2014-05-01 MED ORDER — HEMOSTATIC AGENTS (NO CHARGE) OPTIME
TOPICAL | Status: DC | PRN
  Administered 2014-05-01: 1 via TOPICAL

## 2014-05-01 MED ORDER — ONDANSETRON HCL 4 MG/2ML IJ SOLN
INTRAMUSCULAR | Status: AC
Start: 2014-05-01 — End: 2014-05-01
  Filled 2014-05-01: qty 2

## 2014-05-01 MED ORDER — FENTANYL CITRATE 0.05 MG/ML IJ SOLN
INTRAMUSCULAR | Status: DC | PRN
  Administered 2014-05-01: 100 ug via INTRAVENOUS
  Administered 2014-05-01: 50 ug via INTRAVENOUS

## 2014-05-01 MED ORDER — CEFAZOLIN SODIUM 1-5 GM-% IV SOLN
1.0000 g | Freq: Three times a day (TID) | INTRAVENOUS | Status: AC
  Administered 2014-05-01: 1 g via INTRAVENOUS
  Filled 2014-05-01 (×2): qty 50

## 2014-05-01 MED ORDER — MIDAZOLAM HCL 2 MG/2ML IJ SOLN
INTRAMUSCULAR | Status: AC
  Filled 2014-05-01: qty 2

## 2014-05-01 MED ORDER — DEXAMETHASONE SODIUM PHOSPHATE 4 MG/ML IJ SOLN
4.0000 mg | Freq: Four times a day (QID) | INTRAMUSCULAR | Status: DC
  Filled 2014-05-01 (×4): qty 1

## 2014-05-01 MED ORDER — ONDANSETRON HCL 4 MG PO TABS: 4.0000 mg | ORAL_TABLET | Freq: Three times a day (TID) | ORAL | Status: DC | PRN

## 2014-05-01 MED ORDER — LIDOCAINE HCL (CARDIAC) 20 MG/ML IV SOLN
INTRAVENOUS | Status: AC
  Filled 2014-05-01: qty 10

## 2014-05-01 MED ORDER — MORPHINE SULFATE 2 MG/ML IJ SOLN: 1.0000 mg | INTRAMUSCULAR | Status: DC | PRN

## 2014-05-01 MED ORDER — NEOSTIGMINE METHYLSULFATE 10 MG/10ML IV SOLN
INTRAVENOUS | Status: DC | PRN
  Administered 2014-05-01: 4 mg via INTRAVENOUS

## 2014-05-01 MED ORDER — DEXAMETHASONE SODIUM PHOSPHATE 4 MG/ML IJ SOLN
INTRAMUSCULAR | Status: AC
  Filled 2014-05-01: qty 2

## 2014-05-01 MED ORDER — DEXAMETHASONE 4 MG PO TABS
4.0000 mg | ORAL_TABLET | Freq: Four times a day (QID) | ORAL | Status: DC
  Administered 2014-05-01 – 2014-05-02 (×4): 4 mg via ORAL
  Filled 2014-05-01 (×8): qty 1

## 2014-05-01 MED ORDER — POTASSIUM CHLORIDE IN NACL 20-0.9 MEQ/L-% IV SOLN
INTRAVENOUS | Status: DC
  Filled 2014-05-01 (×3): qty 1000

## 2014-05-01 MED ORDER — SODIUM CHLORIDE 0.9 % IV SOLN: 250.0000 mL | INTRAVENOUS | Status: DC

## 2014-05-01 MED ORDER — LIDOCAINE HCL (CARDIAC) 20 MG/ML IV SOLN
INTRAVENOUS | Status: DC | PRN
  Administered 2014-05-01: 70 mg via INTRAVENOUS

## 2014-05-01 MED ORDER — GLYCOPYRROLATE 0.2 MG/ML IJ SOLN
INTRAMUSCULAR | Status: DC | PRN
  Administered 2014-05-01: 0.6 mg via INTRAVENOUS

## 2014-05-01 MED ORDER — MORPHINE SULFATE ER 15 MG PO TBCR
15.0000 mg | EXTENDED_RELEASE_TABLET | Freq: Two times a day (BID) | ORAL | Status: DC
  Filled 2014-05-01: qty 1

## 2014-05-01 MED ORDER — ACETAMINOPHEN 325 MG PO TABS: 650.0000 mg | ORAL_TABLET | ORAL | Status: DC | PRN

## 2014-05-01 MED ORDER — HYDROMORPHONE HCL 1 MG/ML IJ SOLN
0.2500 mg | INTRAMUSCULAR | Status: DC | PRN
  Administered 2014-05-01 (×4): 0.5 mg via INTRAVENOUS

## 2014-05-01 MED ORDER — ONDANSETRON HCL 4 MG/2ML IJ SOLN: 4.0000 mg | Freq: Once | INTRAMUSCULAR | Status: DC | PRN

## 2014-05-01 MED ORDER — THROMBIN 5000 UNITS EX SOLR
CUTANEOUS | Status: DC | PRN
  Administered 2014-05-01 (×2): 5000 [IU] via TOPICAL

## 2014-05-01 MED ORDER — 0.9 % SODIUM CHLORIDE (POUR BTL) OPTIME
TOPICAL | Status: DC | PRN
  Administered 2014-05-01: 1000 mL

## 2014-05-01 MED ORDER — ACETAMINOPHEN 650 MG RE SUPP: 650.0000 mg | RECTAL | Status: DC | PRN

## 2014-05-01 MED ORDER — FENTANYL CITRATE 0.05 MG/ML IJ SOLN
INTRAMUSCULAR | Status: AC
  Filled 2014-05-01: qty 5

## 2014-05-01 SURGICAL SUPPLY — 42 items
APL SKNCLS STERI-STRIP NONHPOA (GAUZE/BANDAGES/DRESSINGS) ×1
BAG DECANTER FOR FLEXI CONT (MISCELLANEOUS) ×2 IMPLANT
BENZOIN TINCTURE PRP APPL 2/3 (GAUZE/BANDAGES/DRESSINGS) ×2 IMPLANT
BUR MATCHSTICK NEURO 3.0 LAGG (BURR) ×2 IMPLANT
CANISTER SUCT 3000ML (MISCELLANEOUS) ×2 IMPLANT
CONT SPEC 4OZ CLIKSEAL STRL BL (MISCELLANEOUS) ×2 IMPLANT
DRAPE LAPAROTOMY 100X72X124 (DRAPES) ×2 IMPLANT
DRAPE MICROSCOPE LEICA (MISCELLANEOUS) ×2 IMPLANT
DRAPE POUCH INSTRU U-SHP 10X18 (DRAPES) ×2 IMPLANT
DRAPE SURG 17X23 STRL (DRAPES) ×2 IMPLANT
DRSG OPSITE 4X5.5 SM (GAUZE/BANDAGES/DRESSINGS) ×2 IMPLANT
DRSG OPSITE POSTOP 4X6 (GAUZE/BANDAGES/DRESSINGS) ×2 IMPLANT
DRSG TELFA 3X8 NADH (GAUZE/BANDAGES/DRESSINGS) ×2 IMPLANT
DURAPREP 26ML APPLICATOR (WOUND CARE) ×2 IMPLANT
ELECT REM PT RETURN 9FT ADLT (ELECTROSURGICAL) ×2
ELECTRODE REM PT RTRN 9FT ADLT (ELECTROSURGICAL) ×1 IMPLANT
GAUZE SPONGE 4X4 16PLY XRAY LF (GAUZE/BANDAGES/DRESSINGS) IMPLANT
GLOVE BIO SURGEON STRL SZ8 (GLOVE) ×2 IMPLANT
GOWN STRL REUS W/ TWL LRG LVL3 (GOWN DISPOSABLE) IMPLANT
GOWN STRL REUS W/ TWL XL LVL3 (GOWN DISPOSABLE) ×2 IMPLANT
GOWN STRL REUS W/TWL 2XL LVL3 (GOWN DISPOSABLE) ×2 IMPLANT
GOWN STRL REUS W/TWL LRG LVL3 (GOWN DISPOSABLE)
GOWN STRL REUS W/TWL XL LVL3 (GOWN DISPOSABLE) ×4
HEMOSTAT POWDER KIT SURGIFOAM (HEMOSTASIS) IMPLANT
KIT BASIN OR (CUSTOM PROCEDURE TRAY) ×2 IMPLANT
KIT ROOM TURNOVER OR (KITS) ×2 IMPLANT
NEEDLE HYPO 25X1 1.5 SAFETY (NEEDLE) ×2 IMPLANT
NEEDLE SPNL 20GX3.5 QUINCKE YW (NEEDLE) ×2 IMPLANT
NS IRRIG 1000ML POUR BTL (IV SOLUTION) ×2 IMPLANT
PACK LAMINECTOMY NEURO (CUSTOM PROCEDURE TRAY) ×2 IMPLANT
PAD ARMBOARD 7.5X6 YLW CONV (MISCELLANEOUS) ×6 IMPLANT
RUBBERBAND STERILE (MISCELLANEOUS) ×4 IMPLANT
SPONGE SURGIFOAM ABS GEL SZ50 (HEMOSTASIS) ×2 IMPLANT
STRIP CLOSURE SKIN 1/2X4 (GAUZE/BANDAGES/DRESSINGS) ×2 IMPLANT
SUT VIC AB 0 CT1 18XCR BRD8 (SUTURE) ×1 IMPLANT
SUT VIC AB 0 CT1 8-18 (SUTURE) ×2
SUT VIC AB 2-0 CP2 18 (SUTURE) ×2 IMPLANT
SUT VIC AB 3-0 SH 8-18 (SUTURE) ×2 IMPLANT
SYR 20ML ECCENTRIC (SYRINGE) ×2 IMPLANT
TOWEL OR 17X24 6PK STRL BLUE (TOWEL DISPOSABLE) ×2 IMPLANT
TOWEL OR 17X26 10 PK STRL BLUE (TOWEL DISPOSABLE) ×2 IMPLANT
WATER STERILE IRR 1000ML POUR (IV SOLUTION) ×2 IMPLANT

## 2014-05-01 NOTE — Anesthesia Postprocedure Evaluation (Signed)
  Anesthesia Post-op Note  Patient: Andrea [name]  Procedure(s) Performed: Procedure(s): Microdiscectomy - L5-S1 - left (Left)  Patient Location: PACU  Anesthesia Type:General  Level of Consciousness: awake, alert , oriented and patient cooperative  Airway and Oxygen Therapy: Patient Spontanous Breathing  Post-op Pain: mild  Post-op Assessment: Post-op Vital signs reviewed, Patient's Cardiovascular Status Stable, Respiratory Function Stable, Patent Airway, No signs of Nausea or vomiting and Pain level controlled  Post-op Vital Signs: stable  Last Vitals:  Filed Vitals:   05/01/14 1005  BP:   Pulse: 82  Temp: 36.7 C  Resp: 15    Complications: No apparent anesthesia complications

## 2014-05-01 NOTE — Anesthesia Procedure Notes (Signed)
Procedure Name: Intubation Date/Time: 05/01/2014 7:34 AM Performed by: Gaylene Brooks Pre-anesthesia Checklist: Patient identified, Emergency Drugs available, Timeout performed, Suction available and Patient being monitored Patient Re-evaluated:Patient Re-evaluated prior to inductionOxygen Delivery Method: Circle system utilized Preoxygenation: Pre-oxygenation with 100% oxygen Intubation Type: IV induction Ventilation: Mask ventilation without difficulty Laryngoscope Size: Mac and 3 Grade View: Grade II Tube type: Oral Tube size: 7.0 mm Number of attempts: 2 Airway Equipment and Method: Stylet Placement Confirmation: ETT inserted through vocal cords under direct vision,  breath sounds checked- equal and bilateral,  positive ETCO2 and CO2 detector Secured at: 22 cm Tube secured with: Tape Dental Injury: Teeth and Oropharynx as per pre-operative assessment

## 2014-05-01 NOTE — Transfer of Care (Signed)
Immediate Anesthesia Transfer of Care Note  Patient: Andrea [name]  Procedure(s) Performed: Procedure(s): Microdiscectomy - L5-S1 - left (Left)  Patient Location: PACU  Anesthesia Type:General  Level of Consciousness: awake, alert  and oriented  Airway & Oxygen Therapy: Patient Spontanous Breathing and Patient connected to nasal cannula oxygen  Post-op Assessment: Report given to PACU RN, Post -op Vital signs reviewed and stable and Patient moving all extremities X 4  Post vital signs: Reviewed and stable  Complications: No apparent anesthesia complications

## 2014-05-01 NOTE — Op Note (Signed)
05/01/2014  8:55 AM  PATIENT:  Andrea [name]  44 y.o. female  PRE-OPERATIVE DIAGNOSIS:  Left L5-S1 herniated nucleus pulposus with left S1 radiculopathy  POST-OPERATIVE DIAGNOSIS:  Same  PROCEDURE:  Left L5-S1 hemilaminectomy and medial facetectomy and foraminotomies followed by microdiscectomy utilizing microscopic dissection  SURGEON:  Sherley Bounds, MD  ASSISTANTS: None  ANESTHESIA:   General  EBL: Minimal ml     BLOOD ADMINISTERED:none  DRAINS: None   SPECIMEN:  No Specimen  INDICATION FOR PROCEDURE: This patient presented with a long history of left leg pain in an S1 distribution. She had an MRI which showed severe degenerative disc disease L5-S1 with a focal left disc protrusion. She tried medical management without relief. I recommended a left L5-S1 microdiscectomy. Patient understood the risks, benefits, and alternatives and potential outcomes and wished to proceed.  PROCEDURE DETAILS: The patient was taken to the operating room and after induction of adequate generalized endotracheal anesthesia, the patient was rolled into the prone position on the Wilson frame and all pressure points were padded. The lumbar region was cleaned and then prepped with DuraPrep and draped in the usual sterile fashion. 5 cc of local anesthesia was injected and then a dorsal midline incision was made and carried down to the lumbo sacral fascia. The fascia was opened and the paraspinous musculature was taken down in a subperiosteal fashion to expose L5-S1 on the left. Intraoperative x-ray confirmed my level, and then I used a combination of the high-speed drill and the Kerrison punches to perform a hemilaminectomy, medial facetectomy, and foraminotomy at L5-S1 on the left. The underlying yellow ligament was opened and removed in a piecemeal fashion to expose the underlying dura and exiting nerve root. I undercut the lateral recess and dissected down until I was medial to and distal to the pedicle. The  nerve root was well decompressed. We then gently retracted the nerve root medially with a retractor, coagulated the epidural venous vasculature, and incised the disc space. I performed a thorough intradiscal discectomy with pituitary rongeurs and curettes, until I had a nice decompression of the nerve root and the midline. I then palpated with a coronary dilator along the nerve root and into the foramen to assure adequate decompression. I felt no more compression of the nerve root. I irrigated with saline solution containing bacitracin. Achieved hemostasis with bipolar cautery, lined the dura with Gelfoam, and then closed the fascia with 0 Vicryl. I closed the subcutaneous tissues with 2-0 Vicryl and the subcuticular tissues with 3-0 Vicryl. The skin was then closed with benzoin and Steri-Strips. The drapes were removed, a sterile dressing was applied. The patient was awakened from general anesthesia and transferred to the recovery room in stable condition. At the end of the procedure all sponge, needle and instrument counts were correct.   PLAN OF CARE: Admit for overnight observation  PATIENT DISPOSITION:  PACU - hemodynamically stable.   Delay start of Pharmacological VTE agent (>24hrs) due to surgical blood loss or risk of bleeding:  yes

## 2014-05-01 NOTE — H&P (Signed)
Subjective: Patient is a 44 y.o. female admitted for microdiskectomy. Onset of symptoms was a few months ago, gradually worsening since that time.  The pain is rated severe, and is located at the across the lower back and radiates to leg. The pain is described as aching and occurs all day. The symptoms have been progressive. Symptoms are exacerbated by exercise. MRI or CT showed HNP and DD l5-s1.   Past Medical History  Diagnosis Date  . Low back pain radiating to left leg 11/29/2010  . Dysmenorrhea 12/06/2009    Qualifier: Diagnosis of  By: [name] RN, Regino Schultze    . Chronic back pain   . Family history of adverse reaction to anesthesia     mother stopped breathing after surgery  . Shortness of breath dyspnea     with exertion  . Anxiety   . Depression   . Headache     stress  . Anginal pain     chest pain not sure what related to    Past Surgical History  Procedure Laterality Date  . Laproscopy surgery to check her tubes      Prior to Admission medications   Medication Sig Start Date End Date Taking? Authorizing Provider  medroxyPROGESTERone (DEPO-PROVERA) 150 MG/ML injection Inject 1 mL (150 mg total) into the muscle once. Patient taking differently: Inject 150 mg into the muscle every 3 (three) months.  10/11/11  Yes Ansel Bong, MD  morphine (MS CONTIN) 15 MG 12 hr tablet Take 1 tablet (15 mg total) by mouth every 12 (twelve) hours. 04/01/14  Yes Ejiroghene Arlyce Dice, MD  oxyCODONE-acetaminophen (PERCOCET) 7.5-325 MG per tablet Take 1 tablet by mouth every 6 (six) hours as needed for pain. 04/01/14 03/29/15 Yes Ejiroghene Arlyce Dice, MD  ketorolac (TORADOL) 60 MG/2ML SOLN injection Inject 2 mLs (60 mg total) into the muscle once. 04/01/14   Ejiroghene Arlyce Dice, MD  meloxicam (MOBIC) 7.5 MG tablet Take 1 tablet (7.5 mg total) by mouth daily. Patient not taking: Reported on 04/21/2014 01/08/14 01/08/15  [name], MD  methylPREDNIsolone (MEDROL DOSPACK) 4 MG tablet follow package  directions Patient not taking: Reported on 04/21/2014 12/22/13   Juluis Mire, MD  ondansetron (ZOFRAN) 4 MG tablet Take 1 tablet (4 mg total) by mouth every 8 (eight) hours as needed for nausea or vomiting. 12/22/13   Juluis Mire, MD   Allergies  Allergen Reactions  . Hydrocodone Rash    History  Substance Use Topics  . Smoking status: Current Every Day Smoker -- 0.40 packs/day for 20 years    Types: Cigarettes  . Smokeless tobacco: Not on file     Comment: States increased pain./ 5 CIGARETTES PER DAY  . Alcohol Use: No    Family History  Problem Relation Age of Onset  . Diabetes Mother   . Hypertension Mother   . Hypertension Father   . Heart attack Maternal Grandfather   . Hypertension Maternal Grandfather   . Heart attack Paternal Grandfather   . Hypertension Paternal Grandfather      Review of Systems  Positive ROS: neg  All other systems have been reviewed and were otherwise negative with the exception of those mentioned in the HPI and as above.  Objective: Vital signs in last 24 hours: Temp:  [97.9 F (36.6 C)] 97.9 F (36.6 C) (01/08 0618) Pulse Rate:  [77] 77 (01/08 0618) Resp:  [20] 20 (01/08 0618) BP: (101)/(58) 101/58 mmHg (01/08 0618) SpO2:  [100 %] 100 % (01/08 0618)  Weight:  [153 lb (69.4 kg)] 153 lb (69.4 kg) (01/08 5993)  General Appearance: Alert, cooperative, no distress, appears stated age Head: Normocephalic, without obvious abnormality, atraumatic Eyes: PERRL, conjunctiva/corneas clear, EOM's intact    Neck: Supple, symmetrical, trachea midline Back: Symmetric, no curvature, ROM normal, no CVA tenderness Lungs:  respirations unlabored Heart: Regular rate and rhythm Abdomen: Soft, non-tender Extremities: Extremities normal, atraumatic, no cyanosis or edema Pulses: 2+ and symmetric all extremities Skin: Skin color, texture, turgor normal, no rashes or lesions  NEUROLOGIC:   Mental status: Alert and oriented x4,  no aphasia, good  attention span, fund of knowledge, and memory Motor Exam - grossly normal Sensory Exam - grossly normal Reflexes: 1= Coordination - grossly normal Gait - grossly normal Balance - grossly normal Cranial Nerves: I: smell Not tested  II: visual acuity  OS: nl    OD: nl  II: visual fields Full to confrontation  II: pupils Equal, round, reactive to light  III,VII: ptosis None  III,IV,VI: extraocular muscles  Full ROM  V: mastication Normal  V: facial light touch sensation  Normal  V,VII: corneal reflex  Present  VII: facial muscle function - upper  Normal  VII: facial muscle function - lower Normal  VIII: hearing Not tested  IX: soft palate elevation  Normal  IX,X: gag reflex Present  XI: trapezius strength  5/5  XI: sternocleidomastoid strength 5/5  XI: neck flexion strength  5/5  XII: tongue strength  Normal    Data Review Lab Results  Component Value Date   WBC 7.2 04/27/2014   HGB 13.8 04/27/2014   HCT 40.9 04/27/2014   MCV 96.7 04/27/2014   PLT 285 04/27/2014   Lab Results  Component Value Date   NA 137 01/08/2014   K 4.0 01/08/2014   CL 104 01/08/2014   CO2 25 01/08/2014   BUN 7 01/08/2014   CREATININE 0.85 01/08/2014   GLUCOSE 77 01/08/2014   No results found for: INR, PROTIME  Assessment/Plan: Patient admitted for microdiskectomy. Patient has failed a reasonable attempt at conservative therapy.  I explained the condition and procedure to the patient and answered any questions.  Patient wishes to proceed with procedure as planned. Understands risks/ benefits and typical outcomes of procedure.   Andrea [name] S 05/01/2014 7:08 AM

## 2014-05-01 NOTE — Anesthesia Preprocedure Evaluation (Addendum)
Anesthesia Evaluation  Patient identified by MRN, date of birth, ID band Patient awake    Reviewed: Allergy & Precautions, NPO status , Patient's Chart, lab work & pertinent test results  Airway Mallampati: I  TM Distance: >3 FB Neck ROM: Full    Dental  (+) Teeth Intact, Dental Advisory Given, Chipped,    Pulmonary shortness of breath, Current Smoker,          Cardiovascular + angina     Neuro/Psych  Headaches, Anxiety Depression  Neuromuscular disease    GI/Hepatic   Endo/Other    Renal/GU      Musculoskeletal   Abdominal   Peds  Hematology   Anesthesia Other Findings   Reproductive/Obstetrics                            Anesthesia Physical Anesthesia Plan  ASA: II  Anesthesia Plan: General   Post-op Pain Management:    Induction: Intravenous  Airway Management Planned: Oral ETT  Additional Equipment:   Intra-op Plan:   Post-operative Plan: Extubation in OR  Informed Consent: I have reviewed the patients History and Physical, chart, labs and discussed the procedure including the risks, benefits and alternatives for the proposed anesthesia with the patient or authorized representative who has indicated his/her understanding and acceptance.     Plan Discussed with: CRNA, Anesthesiologist and Surgeon  Anesthesia Plan Comments:         Anesthesia Quick Evaluation

## 2014-05-02 DIAGNOSIS — M5117 Intervertebral disc disorders with radiculopathy, lumbosacral region: Secondary | ICD-10-CM | POA: Diagnosis not present

## 2014-05-02 MED ORDER — OXYCODONE-ACETAMINOPHEN 10-325 MG PO TABS: 1.0000 | ORAL_TABLET | ORAL | Status: DC | PRN

## 2014-05-02 MED ORDER — CYCLOBENZAPRINE HCL 10 MG PO TABS: 10.0000 mg | ORAL_TABLET | Freq: Three times a day (TID) | ORAL | Status: DC | PRN

## 2014-05-02 NOTE — Discharge Summary (Signed)
Physician Discharge Summary  Patient ID: Andrea [name] MRN: [id number] DOB/AGE: 09-10-1970 44 y.o.  Admit date: 05/01/2014 Discharge date: 05/02/2014  Admission Diagnoses: L5-S1 herniated nucleus pulposus with left lumbar radiculopathy  Discharge Diagnoses: L5-S1 herniated nucleus pulposus with left lumbar radiculopathy  Active Problems:   S/P lumbar laminectomy   Discharged Condition: good  Hospital Course: Patient underwent surgery to decompress a herniated nucleus pulposus L5-S1 left. She did well  Consults: None  Significant Diagnostic Studies: None  Treatments: Microdiscectomy L5-S1 left with operating microscope Ronalee Belts her dissection technique  Discharge Exam: Blood pressure 94/52, pulse 64, temperature 98.8 F (37.1 C), temperature source Oral, resp. rate 18, height 5\' 6"  (1.676 m), weight 69.4 kg (153 lb), SpO2 100 %. Incision is clean and dry motor function is intact.  Disposition: 01-Home or Self Care  Discharge Instructions    Call MD for:  redness, tenderness, or signs of infection (pain, swelling, redness, odor or green/yellow discharge around incision site)    Complete by:  As directed      Call MD for:  severe uncontrolled pain    Complete by:  As directed      Call MD for:  temperature >100.4    Complete by:  As directed      Diet - low sodium heart healthy    Complete by:  As directed      Increase activity slowly    Complete by:  As directed             Medication List    TAKE these medications        cyclobenzaprine 10 MG tablet  Commonly known as:  FLEXERIL  Take 1 tablet (10 mg total) by mouth 3 (three) times daily as needed for muscle spasms.     ketorolac 60 MG/2ML Soln injection  Commonly known as:  TORADOL  Inject 2 mLs (60 mg total) into the muscle once.     medroxyPROGESTERone 150 MG/ML injection  Commonly known as:  DEPO-PROVERA  Inject 1 mL (150 mg total) into the muscle once.     meloxicam 7.5 MG tablet  Commonly known as:  MOBIC   Take 1 tablet (7.5 mg total) by mouth daily.     methylPREDNIsolone 4 MG tablet  Commonly known as:  MEDROL DOSPACK  follow package directions     morphine 15 MG 12 hr tablet  Commonly known as:  MS CONTIN  Take 1 tablet (15 mg total) by mouth every 12 (twelve) hours.     ondansetron 4 MG tablet  Commonly known as:  ZOFRAN  Take 1 tablet (4 mg total) by mouth every 8 (eight) hours as needed for nausea or vomiting.     oxyCODONE-acetaminophen 7.5-325 MG per tablet  Commonly known as:  PERCOCET  Take 1 tablet by mouth every 6 (six) hours as needed for pain.     oxyCODONE-acetaminophen 10-325 MG per tablet  Commonly known as:  PERCOCET  Take 1 tablet by mouth every 4 (four) hours as needed for pain.         SignedEarleen Newport 05/02/2014, 8:10 AM

## 2014-05-02 NOTE — Discharge Instructions (Signed)
Wound Care Keep incision covered and dry for one week.  If you shower prior to then, cover incision with plastic wrap.  You may remove outer bandage after one week and shower.  Do not put any creams, lotions, or ointments on incision. Leave steri-strips on neck.  They will fall off by themselves. Activity Walk each and every day, increasing distance each day. No lifting greater than 5 lbs.  Avoid bending, arching, or twisting. No driving for 2 weeks; may ride as a passenger locally. If provided with back brace, wear when out of bed.  It is not necessary to wear in bed. Diet Resume your normal diet.  Return to Work Will be discussed at you follow up appointment. Call Your Doctor If Any of These Occur Redness, drainage, or swelling at the wound.  Temperature greater than 101 degrees. Severe pain not relieved by pain medication. Incision starts to come apart. Follow Up Appt Call today for appointment in 1-2 weeks ([phone]) or for problems.  If you have any hardware placed in your spine, you will need an x-ray before your appointment.   Microdiskectomy  Your spine is made up of bones called vertebrae. Oval-shaped disks filled with a thick liquid sit between the vertebrae. The disks function like cushions and keep the bones from rubbing together. However, an injury or normal aging can cause a disk to bulge out (herniated disk). The herniated disk can press on a nerve root, which is painful. The pain may be in the lower back, or it might shoot down a leg. There also may be tingling or numbness. One way to stop the pain is with a minimally invasive surgery called microdiskectomy. The part of the disk that sticks out from the spine is removed. Only a small surgical cut (incision) is made in the back. Microdiskectomy is easier on the muscles and other tissues than traditional surgery.  LET YOUR CAREGIVER KNOW ABOUT:  Any allergies.  The last time you had anything to eat or drink. This includes  water, gum, and candy.  All medicines you are taking, including herbs, eyedrops, over-the-counter medicines and creams.  Blood thinners (anticoagulants), aspirin, or other drugs that could affect blood clotting.  Any history of blood clots.  Any history of bleeding or other blood problems.  Use of steroids (by mouth or as creams).  Previous problems with anesthesia.  Family history of anesthetic complications.  Possibility of pregnancy, if this applies.  Previous surgery.  Smoking history.  Any recent symptoms of colds or infections.  Other health problems. RISKS AND COMPLICATIONS   Leaking of spinal fluid. If this happens, you will need to stay in bed for a few days.  Bleeding.  Pain.  Infection near the incision.  Nerve damage.  Blood clot in a leg. The clot can move to the lungs. This can be very serious.  A herniated disk might come back (recur) and require additional surgery. BEFORE THE PROCEDURE   A medical evaluation will be done. This may include:  A physical exam.  Blood tests.  A test that checks heart rhythm (electrocardiography).  Imaging tests such as a myelogram, chest X-ray, or MRI.  Ask your caregiver about changing or stopping your regular medicines.  You may need to stop using aspirin and nonsteroidal anti-inflammatory drugs (NSAIDs). This includes prescription drugs and over-the-counter drugs. If possible, do this 10 to 14 days before the procedure or as directed. You may also need to stop taking vitamin E.  If you take  blood thinners, ask your caregiver when you should stop taking them.  You may need a stool-softening medicine a few days before the procedure.  Stop smoking 2 weeks before the procedure if you smoke. Smoking can slow down the healing process.  The day before the procedure, eat only a light dinner. Do not eat or drink anything for at least 8 hours before the procedure. Ask if it is okay to take any needed medicines with a  sip of water.  You may talk with the person who will be in charge of the anesthetic medicine during the procedure.  Arrive at least 1 to 2 hours before the procedure or as directed. PROCEDURE  Small monitors will be put on your body. They are used to check your heart, blood pressure, and oxygen level.  An intravenous (IV) access tube will be inserted in your vein. Medicine will be able to flow directly into your body through the IV.  You might be given a medicine to help you relax (sedative).  You will be given a medicine that makes you sleep (general anesthetic).  Your back will be cleaned with a special solution to kill germs on the skin.  Once you are asleep, the surgeon will make a small incision in the middle of your lower back. It is often just 1 to 2 inches long.  Muscles in the back are not cut. They are moved to the side.  Often, a small piece of bone needs to be removed. This creates a better "window" so the surgeon can see the herniated disk.  The surgeon uses a microscope to check the disk and nerves near it.  The part of the disk that is causing problems is removed.  The area under the skin is closed with stitches. In time, these will go away on their own.  The skin is closed with small stitches (sutures) or staples.  A small bandage (dressing) is put over the incision.  The procedure usually takes about 1 hour. AFTER THE PROCEDURE   You will stay in a recovery area until the anesthesia has worn off. Your blood pressure and pulse will be checked every so often.  Some pain is normal after this surgery. You will probably be given pain medicine while in the recovery area.  You may be able to go home the same day as the surgery. Once you get up and start walking, you will be able to go home. Sometimes, an overnight stay is needed. Document Released: 03/29/2009 Document Revised: 07/03/2011 Document Reviewed: 03/29/2009 [facility] Patient Information 2015 [place],  [place]. This information is not intended to replace advice given to you by your health care provider. Make sure you discuss any questions you have with your health care provider.

## 2014-05-02 NOTE — Progress Notes (Signed)
Pt given D/C instructions with Rx's, verbal understanding was provided. Pt's incision was covered with opsite and has no evidence of infection. Pt's IV was removed prior to D/C. Pt D/C'd home via wheelchair @ 0900 per MD order. Pt is stable @ D/C and has no other needs at this time. Holli Humbles, RN

## 2014-05-04 ENCOUNTER — Encounter (HOSPITAL_COMMUNITY): Payer: Self-pay | Admitting: Neurological Surgery

## 2014-06-04 ENCOUNTER — Other Ambulatory Visit: Payer: Self-pay | Admitting: Internal Medicine

## 2014-06-04 ENCOUNTER — Encounter: Payer: Self-pay | Admitting: Licensed Clinical Social Worker

## 2014-06-04 DIAGNOSIS — F32A Depression, unspecified: Secondary | ICD-10-CM

## 2014-06-04 DIAGNOSIS — F329 Major depressive disorder, single episode, unspecified: Secondary | ICD-10-CM

## 2014-06-04 NOTE — Progress Notes (Signed)
Andrea [name] left message with nursing staff requesting referral to counseling utilizing her [facility].  CSW forwarded in basket message to PCP.  CSW will contact Andrea [name] on CSW next business day.

## 2014-06-09 NOTE — Progress Notes (Signed)
CSW placed call to Andrea [name] this morning to confirm referral needs and if pt was already linked with Mental Health services.  Andrea [name] is not currently being seen by a behavioral health provider.  Pt states she contacted the number on the back of the [facility] and was told to have PCP fax over referral and they would "handle everything else".  Per patient request, referral to [facility] for behavioral health services was faxed to [facility].

## 2014-07-06 ENCOUNTER — Encounter: Payer: Self-pay | Admitting: Internal Medicine

## 2014-07-13 ENCOUNTER — Ambulatory Visit (INDEPENDENT_AMBULATORY_CARE_PROVIDER_SITE_OTHER): Payer: Self-pay | Admitting: *Deleted

## 2014-07-13 DIAGNOSIS — Z3042 Encounter for surveillance of injectable contraceptive: Secondary | ICD-10-CM

## 2014-07-13 MED ORDER — MEDROXYPROGESTERONE ACETATE 150 MG/ML IM SUSP
150.0000 mg | INTRAMUSCULAR | Status: DC
  Administered 2014-07-13: 150 mg via INTRAMUSCULAR

## 2014-07-15 ENCOUNTER — Ambulatory Visit: Payer: Medicaid Other | Attending: Neurological Surgery

## 2014-07-15 DIAGNOSIS — M2560 Stiffness of unspecified joint, not elsewhere classified: Secondary | ICD-10-CM | POA: Insufficient documentation

## 2014-07-15 DIAGNOSIS — R293 Abnormal posture: Secondary | ICD-10-CM | POA: Diagnosis not present

## 2014-07-15 DIAGNOSIS — M545 Low back pain, unspecified: Secondary | ICD-10-CM

## 2014-07-15 DIAGNOSIS — R262 Difficulty in walking, not elsewhere classified: Secondary | ICD-10-CM | POA: Diagnosis not present

## 2014-07-15 DIAGNOSIS — G478 Other sleep disorders: Secondary | ICD-10-CM

## 2014-07-15 DIAGNOSIS — IMO0002 Reserved for concepts with insufficient information to code with codable children: Secondary | ICD-10-CM

## 2014-07-15 NOTE — Patient Instructions (Signed)
I asked her to bring in all HEP info she has.

## 2014-07-15 NOTE — Therapy (Signed)
[place], [place], [address] Phone: [phone]   Fax:  [phone]  Physical Therapy Evaluation  Patient Details  Name: Andrea [name] MRN: [id number] Date of Birth: July 15, 1970 Referring Provider:  Eustace Moore, MD  Encounter Date: 07/15/2014      PT End of Session - 07/15/14 1224    Visit Number 1   Number of Visits 12   Date for PT Re-Evaluation 08/26/14   PT Start Time 5573   PT Stop Time 2202   PT Time Calculation (min) 50 min   Activity Tolerance Patient limited by pain   Behavior During Therapy Anxious      Past Medical History  Diagnosis Date  . Low back pain radiating to left leg 11/29/2010  . Dysmenorrhea 12/06/2009    Qualifier: Diagnosis of  By: [name] RN, Regino Schultze    . Chronic back pain   . Family history of adverse reaction to anesthesia     mother stopped breathing after surgery  . Shortness of breath dyspnea     with exertion  . Anxiety   . Depression   . Headache     stress  . Anginal pain     chest pain not sure what related to    Past Surgical History  Procedure Laterality Date  . Laproscopy surgery to check her tubes    . Lumbar laminectomy/decompression microdiscectomy Left 05/01/2014    Procedure: Microdiscectomy - L5-S1 - left;  Surgeon: Eustace Moore, MD;  Location: [place];  Service: Neurosurgery;  Laterality: Left;    There were no vitals filed for this visit.  Visit Diagnosis:  Abnormal posture - Plan: PT plan of care cert/re-cert  Bilateral low back pain without sciatica - Plan: PT plan of care cert/re-cert  Stiffness of joint of lower extremity - Plan: PT plan of care cert/re-cert  Difficulty waking - Plan: PT plan of care cert/re-cert      Subjective Assessment - 07/15/14 1152    Symptoms LBP across back. Leg pain gone with surgery but standing erect she gets shooting /sharp pain across back an upper lower back   Pertinent History She reports discectomy  05/01/14.  She had back pain as far back as 2011.    Limitations Sitting;Standing;Walking   How long can you sit comfortably? She sits facingRT on RT hip facing floor.    How long can you stand comfortably? < 5 min   How long can you walk comfortably? < 5 min   Patient Stated Goals Decrease pain   Currently in Pain? Yes   Pain Score 8   no meds   Pain Location Back   Pain Orientation Lower;Right;Left   Pain Descriptors / Indicators Sharp;Shooting   Pain Type Acute pain   Pain Onset More than a month ago  surgury pain on top of chronic pain   Pain Frequency Constant   Aggravating Factors  activity   Pain Relieving Factors Medicaitions   Effect of Pain on Daily Activities Unable to do any activity   Multiple Pain Sites No            [facility] PT Assessment - 07/15/14 1158    Assessment   Medical Diagnosis post discectomy   Onset Date 05/01/14   Prior Therapy PT in 2011   Precautions   Precautions None   Restrictions   Weight Bearing Restrictions No   Balance Screen   Has the patient fallen in the past 6 months  Yes   How many times? 3x/month   Has the patient had a decrease in activity level because of a fear of falling?  Yes  due to pain   Is the patient reluctant to leave their home because of a fear of falling?  Yes  due to pain   Home Environment   Additional Comments She is homeless staying with friends   Prior Function   Level of Independence Needs assistance with ADLs;Needs assistance with homemaking   Posture/Postural Control   Posture Comments Stands with hips and knees flexed and shoulders baack.    ROM / Strength   AROM / PROM / Strength AROM;Strength;PROM   AROM   Overall AROM Comments All hip and back ranges are limited due to pain.    PROM   Overall PROM Comments Hip range WNL but all painful.    Palpation   Palpation Tender over all lower back and buttocks.  but tissue more suppe in prone than standing.    Ambulation/Gait   Gait Comments Slow , antalgic  with flexed hip nad knees.                    [facility] PT Treatment/Exercise - 07/15/14 1158    Modalities   Modalities Electrical Stimulation;Moist Heat   Moist Heat Therapy   Number Minutes Moist Heat 20 Minutes   Moist Heat Location --  back   Electrical Stimulation   Electrical Stimulation Location back   Electrical Stimulation Action IFC   Electrical Stimulation Parameters L20   Electrical Stimulation Goals Pain                PT Education - 07/15/14 1223    Education provided Yes   Education Details POC,    Person(s) Educated Patient   Methods Explanation   Comprehension Verbalized understanding          PT Short Term Goals - 07/15/14 1229    PT SHORT TERM GOAL #1   Title she will be independent with intial HEP   Baseline need to review exercise pt already has   Time 3   Period Weeks   Status New   PT SHORT TERM GOAL #2   Title she will report 25% decrease in pain and able to sit erect with support in chair   Baseline 8.5 pain without meds   Time 6   Period Weeks   Status New           PT Long Term Goals - 07/15/14 1228    PT LONG TERM GOAL #1   Title She will be independent in all hEP issued as of last vist   Baseline independent  with intial HEP   Time 6   Period Weeks   Status New   PT LONG TERM GOAL #2   Title she will be able to walk more erct and with tolerance to be on feet 30 minutes or more   Baseline <5 min   Time 6   Period Weeks   Status New   PT LONG TERM GOAL #3   Title She will be abl to move LE WNL activley   Baseline Limited in all LE motions due to pain   Time 6   Period Weeks   Status New               Plan - 07/15/14 1224    Clinical Impression Statement She appears to have good flexibility . Unable to assess  strength. She is pain dominent in all activity . She is reluctant to do anything tht has pain. She reports she has had modalities in past without benefit. , though heat helps her sleep/.    Pt will benefit from skilled therapeutic intervention in order to improve on the following deficits Difficulty walking;Pain;Decreased activity tolerance;Increased muscle spasms;Postural dysfunction   Rehab Potential Fair   PT Frequency 2x / week   PT Duration 6 weeks   PT Treatment/Interventions Moist Heat;Electrical Stimulation;Patient/family education;Passive range of motion;Therapeutic exercise;Manual techniques;Dry needling   PT Next Visit Plan Review any exercise she has been doign, MHP and ES, manual treatment   Consulted and Agree with Plan of Care Patient         Problem List Patient Active Problem List   Diagnosis Date Noted  . S/P lumbar laminectomy 05/01/2014  . Tachycardia 12/04/2013  . Annular disc tear 07/09/2013  . Depression 05/14/2013  . Depo-Provera contraceptive status 01/15/2012  . Tobacco abuse 10/11/2011  . Degenerative disc disease, lumbar 11/29/2010  . Dysmenorrhea 12/06/2009    Darrel Hoover PT 07/15/2014, 12:34 PM  [facility] [address] [place], [place], [address] Phone: [phone]   Fax:  [phone]

## 2014-07-29 ENCOUNTER — Ambulatory Visit: Payer: Medicaid Other | Attending: Neurological Surgery | Admitting: Physical Therapy

## 2014-07-29 DIAGNOSIS — M545 Low back pain, unspecified: Secondary | ICD-10-CM

## 2014-07-29 DIAGNOSIS — M2560 Stiffness of unspecified joint, not elsewhere classified: Secondary | ICD-10-CM | POA: Diagnosis not present

## 2014-07-29 DIAGNOSIS — R262 Difficulty in walking, not elsewhere classified: Secondary | ICD-10-CM | POA: Insufficient documentation

## 2014-07-29 DIAGNOSIS — G478 Other sleep disorders: Secondary | ICD-10-CM

## 2014-07-29 DIAGNOSIS — R293 Abnormal posture: Secondary | ICD-10-CM

## 2014-07-29 DIAGNOSIS — IMO0002 Reserved for concepts with insufficient information to code with codable children: Secondary | ICD-10-CM

## 2014-07-29 NOTE — Patient Instructions (Signed)
Hold exercises for now except for muscle energy.  When she can sit more comfortable then may continue .
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
## 2014-08-04 ENCOUNTER — Encounter: Payer: No Typology Code available for payment source | Admitting: Physical Therapy

## 2014-08-12 ENCOUNTER — Ambulatory Visit: Payer: Medicaid Other

## 2014-08-17 ENCOUNTER — Ambulatory Visit: Payer: Self-pay

## 2014-08-18 ENCOUNTER — Encounter: Payer: Self-pay | Admitting: Internal Medicine

## 2014-08-18 ENCOUNTER — Ambulatory Visit (INDEPENDENT_AMBULATORY_CARE_PROVIDER_SITE_OTHER): Payer: No Typology Code available for payment source | Admitting: Internal Medicine

## 2014-08-18 DIAGNOSIS — M5116 Intervertebral disc disorders with radiculopathy, lumbar region: Secondary | ICD-10-CM | POA: Insufficient documentation

## 2014-08-18 DIAGNOSIS — Z9889 Other specified postprocedural states: Secondary | ICD-10-CM

## 2014-08-18 DIAGNOSIS — M545 Low back pain: Secondary | ICD-10-CM

## 2014-08-18 MED ORDER — KETOROLAC TROMETHAMINE 30 MG/ML IJ SOLN
60.0000 mg | Freq: Once | INTRAMUSCULAR | Status: AC
  Administered 2014-08-18: 60 mg via INTRAMUSCULAR

## 2014-08-18 MED ORDER — KETOROLAC TROMETHAMINE 10 MG PO TABS: 10.0000 mg | ORAL_TABLET | Freq: Three times a day (TID) | ORAL | Status: DC | PRN

## 2014-08-18 MED ORDER — KETOROLAC TROMETHAMINE 60 MG/2ML IM SOLN: 60.0000 mg | Freq: Once | INTRAMUSCULAR | Status: DC

## 2014-08-18 NOTE — Assessment & Plan Note (Signed)
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 Underwent microdiscectomy on 05/01/2014 performed by Dr Ronnald Ramp of neurosurgery. She states that her pain has not changed much since the surgery. She completed physical therapy, which was provided postoperatively. She requests for Toradol IM injection, which has been effective in relieving her pain previously. She also continues to be on Percocet prescribed by her neurosurgeon. No acute neurological symptoms or signs on exam Plan -IM Toradol 60 mg once -Trial of PO Toradol 10 mg every 8 hours prn. Will limit used to not more than 5 consecutive days. Most recent renal function was normal. -Continue with Percocet 10-325 mg every 4-6 hours prn -Follow-up in one month to evaluate pain management.

## 2014-08-18 NOTE — Progress Notes (Signed)
Patient ID: Andrea [name], female   DOB: 04/08/1971, 44 y.o.   MRN: [id number]   Subjective:   HPI: Andrea [name] is a 44 y.o. woman with past medical history as mentioned below, presents for an acute visit. 3 months ago, she underwent microdiscectomy L5-S1 by Dr Ronnald Ramp of neurosurgery.    Reason(s) for visit:  Lower back pain:  Patient complains of severe low back pain which has been persistent despite the surgery that she underwent in January. She has just seen her neurosurgeon, Dr. Ronnald Ramp. Her current regimen is Percocet 10-'325mg'$  1-2 pills every 4-6 hours as needed for pain. She states that this regimen has not been very effective. She requests for IM Toradol which she used to receive her months. She states that this used to help her pain. She was in the pain clinic preoperatively and she has completed physical therapy postoperatively. Her pain is the main concern, however, she denies any urinary or fecal incontinence. She does state that sometimes the pain shoots into her right leg. During the entire interview, patient remained standing, holding herself on the couch. She states that sitting increases her pain.  Past Medical History  Diagnosis Date  . Low back pain radiating to left leg 11/29/2010  . Dysmenorrhea 12/06/2009    Qualifier: Diagnosis of  By: [name] RN, Regino Schultze    . Chronic back pain   . Family history of adverse reaction to anesthesia     mother stopped breathing after surgery  . Shortness of breath dyspnea     with exertion  . Anxiety   . Depression   . Headache     stress  . Anginal pain     chest pain not sure what related to    ROS: Constitutional:  Denies fevers, chills, diaphoresis, appetite change and fatigue.  Respiratory: Denies SOB, DOE, cough, chest tightness, and wheezing.  CVS: No chest pain, palpitations and leg swelling.  GI: No abdominal pain, nausea, vomiting, bloody stools GU: No dysuria, frequency, hematuria, or flank pain.  Psych: No depression  symptoms. No SI or SA.    Objective:  Physical Exam: Filed Vitals:   08/18/14 0958  BP: 100/72  Pulse: 93  Temp: 98.4 F (36.9 C)  TempSrc: Oral  Height: 5' 6.5" (1.689 m)  Weight: 149 lb 11.2 oz (67.903 kg)  SpO2: 99%   General: Well nourished. No acute distress.  HEENT: Normal oral mucosa. MMM.  Lungs: CTA bilaterally. No wheezing. Heart: RRR; no extra sounds or murmurs  Abdomen: Non-distended, normal bowel sounds, soft, nontender; no hepatosplenomegaly  Back: Surgical incision at the level L4, L5/S1 looks clean and nicely healing. She has some tenderness in the same region. She is able to ambulate without assistance. Extremities: No pedal edema. No joint swelling or tenderness. Neurologic: Normal EOM,  Alert and oriented x3. No obvious neurologic/cranial nerve deficits.  Assessment & Plan:  Discussed case with Dr.Narendra. See problem based charting for assessment and plan.

## 2014-08-18 NOTE — Patient Instructions (Signed)
General Instructions: We will give you an injection of toradol today  Please use toradol tablets for not more than 5 consecutive days   Please come back in a month and see Dr Genene Churn.   Please bring your medicines with you each time you come to clinic.  Medicines may include prescription medications, over-the-counter medications, herbal remedies, eye drops, vitamins, or other pills.   Progress Toward Treatment Goals:  Treatment Goal 08/19/2013  Stop smoking smoking the same amount    Self Care Goals & Plans:  Self Care Goal 04/01/2014  Manage my medications take my medicines as prescribed; bring my medications to every visit; refill my medications on time  Eat healthy foods eat more vegetables; eat foods that are low in salt; eat baked foods instead of fried foods  Be physically active -  Stop smoking set a quit date and stop smoking    No flowsheet data found.   Care Management & Community Referrals:  No flowsheet data found.

## 2014-08-19 NOTE — Progress Notes (Signed)
INTERNAL MEDICINE TEACHING ATTENDING ADDENDUM - Auburn Hester, MD: I reviewed and discussed at the time of visit with the resident Dr. Kazibwe, the patient's medical history, physical examination, diagnosis and results of pertinent tests and treatment and I agree with the patient's care as documented.  

## 2014-08-20 ENCOUNTER — Ambulatory Visit: Payer: No Typology Code available for payment source

## 2014-08-22 ENCOUNTER — Emergency Department (HOSPITAL_COMMUNITY): Payer: No Typology Code available for payment source

## 2014-08-22 ENCOUNTER — Encounter (HOSPITAL_COMMUNITY): Payer: Self-pay | Admitting: Emergency Medicine

## 2014-08-22 ENCOUNTER — Emergency Department (HOSPITAL_COMMUNITY)
Admission: EM | Admit: 2014-08-22 | Discharge: 2014-08-22 | Disposition: A | Discharge status: Home / Self Care | Payer: No Typology Code available for payment source | Attending: Emergency Medicine | Admitting: Emergency Medicine

## 2014-08-22 DIAGNOSIS — Z8742 Personal history of other diseases of the female genital tract: Secondary | ICD-10-CM | POA: Insufficient documentation

## 2014-08-22 DIAGNOSIS — S3992XA Unspecified injury of lower back, initial encounter: Secondary | ICD-10-CM | POA: Diagnosis not present

## 2014-08-22 DIAGNOSIS — R52 Pain, unspecified: Secondary | ICD-10-CM

## 2014-08-22 DIAGNOSIS — Z8679 Personal history of other diseases of the circulatory system: Secondary | ICD-10-CM | POA: Insufficient documentation

## 2014-08-22 DIAGNOSIS — M542 Cervicalgia: Secondary | ICD-10-CM

## 2014-08-22 DIAGNOSIS — Z8659 Personal history of other mental and behavioral disorders: Secondary | ICD-10-CM | POA: Diagnosis not present

## 2014-08-22 DIAGNOSIS — G8929 Other chronic pain: Secondary | ICD-10-CM | POA: Insufficient documentation

## 2014-08-22 DIAGNOSIS — S199XXA Unspecified injury of neck, initial encounter: Secondary | ICD-10-CM | POA: Diagnosis not present

## 2014-08-22 DIAGNOSIS — Y9389 Activity, other specified: Secondary | ICD-10-CM | POA: Insufficient documentation

## 2014-08-22 DIAGNOSIS — Y92410 Unspecified street and highway as the place of occurrence of the external cause: Secondary | ICD-10-CM | POA: Diagnosis not present

## 2014-08-22 DIAGNOSIS — Z720 Tobacco use: Secondary | ICD-10-CM | POA: Diagnosis not present

## 2014-08-22 DIAGNOSIS — Y998 Other external cause status: Secondary | ICD-10-CM | POA: Insufficient documentation

## 2014-08-22 DIAGNOSIS — M545 Low back pain: Secondary | ICD-10-CM

## 2014-08-22 MED ORDER — KETOROLAC TROMETHAMINE 60 MG/2ML IM SOLN
60.0000 mg | Freq: Once | INTRAMUSCULAR | Status: AC
  Administered 2014-08-22: 60 mg via INTRAMUSCULAR
  Filled 2014-08-22: qty 2

## 2014-08-22 NOTE — ED Notes (Signed)
Pt alert,oriented, and ambulatory upon DC. She was advised to follow up with PCP in 2-5 days.
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
## 2014-08-31 ENCOUNTER — Ambulatory Visit (INDEPENDENT_AMBULATORY_CARE_PROVIDER_SITE_OTHER): Payer: No Typology Code available for payment source | Admitting: Internal Medicine

## 2014-08-31 ENCOUNTER — Encounter: Payer: Self-pay | Admitting: Internal Medicine

## 2014-08-31 ENCOUNTER — Telehealth: Payer: Self-pay | Admitting: Licensed Clinical Social Worker

## 2014-08-31 VITALS — BP 119/86 | HR 71 | Temp 98.0°F | Ht 67.8 in | Wt 154.3 lb

## 2014-08-31 DIAGNOSIS — Z0000 Encounter for general adult medical examination without abnormal findings: Secondary | ICD-10-CM

## 2014-08-31 DIAGNOSIS — M5116 Intervertebral disc disorders with radiculopathy, lumbar region: Secondary | ICD-10-CM

## 2014-08-31 MED ORDER — CYCLOBENZAPRINE HCL 10 MG PO TABS: 10.0000 mg | ORAL_TABLET | Freq: Three times a day (TID) | ORAL | Status: DC | PRN

## 2014-08-31 MED ORDER — KETOROLAC TROMETHAMINE 60 MG/2ML IM SOLN
60.0000 mg | Freq: Once | INTRAMUSCULAR | Status: AC
  Administered 2014-08-31: 60 mg via INTRAMUSCULAR

## 2014-08-31 NOTE — Patient Instructions (Signed)
Take flexiril for muscle spasm, especially for your neck spasm.  Continue to see Dr. Ronnald Ramp. I will let him manage your pain medicine.  See [place] as needed.
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
## 2014-08-31 NOTE — Telephone Encounter (Signed)
Ms. Dow requesting written assistance for help with her Medicaid appeal.  Pt states she is unable to work and received letter from physician stating she would be disabled for over 12 months, but that letter was not included in her Medicaid hearing.  Pt states she received 10 days to appeal.  Ms. Lodico states she needs assistance with writing everything down for the hearing.  CSW informed Ms. Puerto, medicaid office provides assistance to those in need of completing application due to disability.  Pt currently has a disability hearing on July 12th.  CSW informed Ms. Vallely without being disabled, she would need to provide Medicaid with hospital debt.  Pt states "nevermind , you don't understand".  Pt has an appointment in [facility] this afternoon, CSW will touch base with Ms. Begin.

## 2014-09-01 NOTE — Progress Notes (Signed)
INTERNAL MEDICINE TEACHING ATTENDING ADDENDUM - Dewayne Jurek, MD: I reviewed and discussed at the time of visit with the resident Dr. [name], the patient's medical history, physical examination, diagnosis and results of pertinent tests and treatment and I agree with the patient's care as documented.  

## 2014-09-01 NOTE — Telephone Encounter (Signed)
CSW met with Ms. Arel during her scheduled [facility] appointment.  Pt in need of assistance with organizing her information to appeal her Medicaid denial.  Pt states a lot of her chart information was not included.  CSW will remain available on 09/03/14 to assist pt as needed.

## 2014-09-25 ENCOUNTER — Ambulatory Visit (INDEPENDENT_AMBULATORY_CARE_PROVIDER_SITE_OTHER): Payer: No Typology Code available for payment source | Admitting: Internal Medicine

## 2014-09-25 ENCOUNTER — Encounter: Payer: Self-pay | Admitting: Internal Medicine

## 2014-09-25 ENCOUNTER — Ambulatory Visit: Payer: No Typology Code available for payment source | Admitting: Internal Medicine

## 2014-09-25 VITALS — BP 100/68 | HR 98 | Temp 98.0°F | Ht 67.8 in | Wt 148.7 lb

## 2014-09-25 DIAGNOSIS — M5116 Intervertebral disc disorders with radiculopathy, lumbar region: Secondary | ICD-10-CM

## 2014-09-25 MED ORDER — KETOROLAC TROMETHAMINE 60 MG/2ML IJ SOLN: 60.0000 mg | Freq: Once | INTRAMUSCULAR | Status: DC

## 2014-09-25 MED ORDER — KETOROLAC TROMETHAMINE 30 MG/ML IJ SOLN
60.0000 mg | Freq: Once | INTRAMUSCULAR | Status: AC
Start: 2014-09-25 — End: 2014-09-25
  Administered 2014-09-25: 60 mg via INTRAMUSCULAR

## 2014-09-25 NOTE — Patient Instructions (Signed)
General Instructions: We have given you an injection of toradol today Please see Dr Genene Churn in 4 weeks to discuss referral We will get the results from your lab work from Dr Ronnald Ramp' office   Please bring your medicines with you each time you come to clinic.  Medicines may include prescription medications, over-the-counter medications, herbal remedies, eye drops, vitamins, or other pills.   Progress Toward Treatment Goals:  Treatment Goal 08/19/2013  Stop smoking smoking the same amount    Self Care Goals & Plans:  Self Care Goal 04/01/2014  Manage my medications take my medicines as prescribed; bring my medications to every visit; refill my medications on time  Eat healthy foods eat more vegetables; eat foods that are low in salt; eat baked foods instead of fried foods  Be physically active -  Stop smoking set a quit date and stop smoking    No flowsheet data found.   Care Management & Community Referrals:  No flowsheet data found.

## 2014-09-25 NOTE — Progress Notes (Signed)
Patient ID: Andrea [name], female   DOB: 29-Apr-1970, 44 y.o.   MRN: [id number]   Subjective:   HPI: [name] is a 44 y.o. woman with past medical history as mentioned below, presents for an acute visit. On 05/01/14, she underwent microdiscectomy L5-S1 by Dr Ronnald Ramp of neurosurgery.    Reason(s) for visit:  Lower back pain:  Patient complains of severe low back pain which has been persistent despite the surgery that she underwent in January. She has just seen her neurosurgeon, Dr. Ronnald Ramp but she states that they may not see her again due to unpaid bills and issues with her [employer]. Her current regimen is Percocet 10-'325mg'$  1-2 pills every 4-6 hours as needed for pain. She states that this regimen has not been very effective. She requests for IM Toradol which she used to receive a few months ago. Her last injection was on 5/9. She states that this helps her pain. She was in the pain clinic preoperatively and she has completed physical therapy postoperatively. Her pain is the main concern, however, she denies any urinary or fecal incontinence. She does state that sometimes the pain shoots into her right leg. During the interview, patient preferred standing, holding herself on the couch. She states that sitting increases her pain.  Past Medical History  Diagnosis Date  . Low back pain radiating to left leg 11/29/2010  . Dysmenorrhea 12/06/2009    Qualifier: Diagnosis of  By: [name] RN, Regino Schultze    . Chronic back pain   . Family history of adverse reaction to anesthesia     mother stopped breathing after surgery  . Shortness of breath dyspnea     with exertion  . Anxiety   . Depression   . Headache     stress  . Anginal pain     chest pain not sure what related to    ROS: Constitutional:  Denies fevers, chills, diaphoresis, appetite change and fatigue.  Respiratory: Denies SOB, DOE, cough, chest tightness, and wheezing.  CVS: No chest pain, palpitations and leg swelling.  GI: No  abdominal pain, nausea, vomiting, bloody stools GU: No dysuria, frequency, hematuria, or flank pain.  Psych: No depression symptoms. No SI or SA.    Objective:  Physical Exam: Filed Vitals:   09/25/14 1548  BP: 100/68  Pulse: 98  Temp: 98 F (36.7 C)  TempSrc: Oral  Height: 5' 7.8" (1.722 m)  Weight: 148 lb 11.2 oz (67.45 kg)  SpO2: 100%   General: Well nourished. No acute distress.  HEENT: Normal oral mucosa. MMM.  Lungs: CTA bilaterally. No wheezing. Heart: RRR; no extra sounds or murmurs  Abdomen: Non-distended, normal bowel sounds, soft, nontender; no hepatosplenomegaly  Back: Surgical incision at the level L4, L5/S1 looks clean and nicely healing. She has some tenderness in the same region. She is able to ambulate without assistance. Extremities: No pedal edema. No joint swelling or tenderness. Neurologic: Normal EOM,  Alert and oriented x3. No obvious neurologic/cranial nerve deficits.  Assessment & Plan:  Discussed case with Dr Ellwood Dense. See problem based charting for assessment and plan.

## 2014-09-25 NOTE — Assessment & Plan Note (Addendum)
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 Underwent microdiscectomy on 05/01/2014 performed by Dr Ronnald Ramp of neurosurgery. She states that her pain did not change much since the surgery. She completed physical therapy, which was provided postoperatively. She requests for Toradol IM injection, which has been effective in relieving her pain previously. Last injection was on 5/9 during her clinic visit. She also continues to be on Percocet prescribed by her neurosurgeon but she is running out in 3-4 weeks. Dr Ronnald Ramp may not see her due to unpaid bills and [employer] issues. She wants to discuss with her PCP regarding referral to orthopedic surgery. No acute neurological symptoms or signs on exam Plan -IM Toradol 60 mg once - Most recent renal function was 8 mo ago and was normal. She declines blood draw today saying that she had lab work at [facility]' office this past month. We have requested for results. - Given her high dose Toradol monthly, she is at an increased risk GERD. She has no GERD symptoms currently but this should be monitored and addition of PPI can be considered if needed in the future. -Continue with Percocet 10-325 mg every 4-6 hours prn. Prescribed by Dr Ronnald Ramp -Follow-up in 3-4 wks PCP to discuss referral to ortho.

## 2014-09-25 NOTE — Progress Notes (Signed)
Internal Medicine Clinic Attending  Case discussed with Dr. Kazibwe at the time of the visit.  We reviewed the resident's history and exam and pertinent patient test results.  I agree with the assessment, diagnosis, and plan of care documented in the resident's note. 

## 2014-10-05 ENCOUNTER — Ambulatory Visit (INDEPENDENT_AMBULATORY_CARE_PROVIDER_SITE_OTHER): Payer: No Typology Code available for payment source | Admitting: *Deleted

## 2014-10-05 DIAGNOSIS — Z3042 Encounter for surveillance of injectable contraceptive: Secondary | ICD-10-CM

## 2014-10-05 MED ORDER — MEDROXYPROGESTERONE ACETATE 150 MG/ML IM SUSP
150.0000 mg | INTRAMUSCULAR | Status: DC
  Administered 2014-10-05: 150 mg via INTRAMUSCULAR

## 2014-10-19 ENCOUNTER — Encounter: Payer: Self-pay | Admitting: Internal Medicine

## 2014-10-19 ENCOUNTER — Ambulatory Visit (INDEPENDENT_AMBULATORY_CARE_PROVIDER_SITE_OTHER): Payer: No Typology Code available for payment source | Admitting: Internal Medicine

## 2014-10-19 VITALS — BP 129/79 | HR 98 | Temp 98.0°F | Wt 145.6 lb

## 2014-10-19 DIAGNOSIS — M5116 Intervertebral disc disorders with radiculopathy, lumbar region: Secondary | ICD-10-CM

## 2014-10-19 MED ORDER — OXYCODONE-ACETAMINOPHEN 10-325 MG PO TABS: 1.0000 | ORAL_TABLET | ORAL | Status: DC | PRN

## 2014-10-19 NOTE — Patient Instructions (Signed)
We will supply you with percocet for now but we will not be doing this indefinitely. We need to find a permanent fix for the pain. You should start saving some money every month to be able to pay the co-pay at the neurosurgery office.  If we have any ortho referral opening up we will try to refer you to ortho in the future.

## 2014-10-19 NOTE — Assessment & Plan Note (Addendum)
Ran out of pain meds from neurosurgery. Doesn't have enough money to pay the $50 copay at their office. Wants [place] to manage her pain meds. Dr. Ronnald Ramp planned on repeating imaging and also possibly needing another surgery. Since patient can't afford the copay, we agreed to prescribe her #120 tabs of percocet monthly.  Signed pain contract for this. Told her this is only temporary. She will need to find a way to get back with Dr. Ronnald Ramp within next 6 months or so. We don't have any ortho referral for orange card for now but may refer her if anything opens up later.  She stated she appealed for medicaid and also working for disability. There is a hearing coming up soon for her disability. If that gets approved that she will be able to see Dr. Ronnald Ramp.  We will get MRI L spine now to look for any new compression/inflammatory changes. - gave 1 month supply of #120 percocet 10-'325mg'$ 

## 2014-10-19 NOTE — Progress Notes (Signed)
Copy of Pain contract given to pt. 

## 2014-10-19 NOTE — Progress Notes (Signed)
   Subjective:    Patient ID: Andrea [name], female    DOB: 1970-06-08, 44 y.o.   MRN: [id number]  HPI  44 yo female with chronic back pain, with lumbar disc prolapse, s/p laminectomy on 04/2014 by Dr. Ronnald Ramp here with back pain.  She used to see Dr. Ronnald Ramp but without medicaid, she cannot afford seeing them as the copay would be $50 per patient. She used to get percocet from their office but she wants [place] to prescribe it for her.  Used to get #120 /monthly supply from them. Ran out 2 days ago. Has tried robaxin and flexiril without any relief. PT made pain worse and was told by Dr. Ronnald Ramp to stop PT. They planned to repeat imaging and also thought may need another surgery soon.  Has orange card - no ortho referral available currently.  Currently having lower back pain L>R with sharp shooting pain down her left leg. Hard to move around due to pain, using crutches to walk. No weakness. Has some numbness on the legs, no bowel or bladder incontinence (had this before the surgery). Pain interferes with the movement.   Has some erythema after her left upper tooth extraction. Taking amoxicillin   Review of Systems  Constitutional: Negative for chills and fatigue.  Eyes: Negative for photophobia and visual disturbance.  Respiratory: Negative for cough, chest tightness, shortness of breath and wheezing.   Cardiovascular: Negative for chest pain, palpitations and leg swelling.  Gastrointestinal: Negative for nausea, vomiting, abdominal pain and abdominal distention.  Endocrine: Negative.   Genitourinary: Negative for dysuria and difficulty urinating.  Musculoskeletal: Positive for back pain.  Neurological: Positive for numbness. Negative for dizziness, weakness and headaches.       Objective:   Physical Exam  Constitutional: She is oriented to person, place, and time. She appears well-developed.  Lying down on chair, appears to be in pain.   HENT:  Head: Normocephalic and atraumatic.    Mouth/Throat: Oropharynx is clear and moist. No oropharyngeal exudate.  Has some erythema after her left upper tooth extraction.  Eyes: Conjunctivae are normal. Pupils are equal, round, and reactive to light.  Neck: No JVD present.  Cardiovascular: Normal rate and regular rhythm.  Exam reveals no gallop and no friction rub.   No murmur heard. Pulmonary/Chest: Effort normal and breath sounds normal. No respiratory distress. She has no wheezes.  Abdominal: Soft. Bowel sounds are normal. She exhibits no distension. There is no tenderness. There is no rebound.  Musculoskeletal: Normal range of motion. She exhibits no edema or tenderness.  Neurological: She is alert and oriented to person, place, and time. No cranial nerve deficit.    Filed Vitals:   10/19/14 1450  BP: 129/79  Pulse: 98  Temp: 98 F (36.7 C)       Assessment & Plan:  See problem based a&p.

## 2014-10-20 LAB — PRESCRIPTION ABUSE MONITORING 15P, URINE
Amphetamine/Meth: NEGATIVE ng/mL
BARBITURATE SCREEN, URINE: NEGATIVE ng/mL
BENZODIAZEPINE SCREEN, URINE: NEGATIVE ng/mL
Buprenorphine, Urine: NEGATIVE ng/mL
CARISOPRODOL, URINE: NEGATIVE ng/mL
Cocaine Metabolites: NEGATIVE ng/mL
Creatinine, Urine: 44.08 mg/dL (ref >20.0)
Fentanyl, Ur: NEGATIVE ng/mL
METHADONE SCREEN, URINE: NEGATIVE ng/mL
Meperidine, Ur: NEGATIVE ng/mL
Propoxyphene: NEGATIVE ng/mL
TRAMADOL UR: NEGATIVE ng/mL
Zolpidem, Urine: NEGATIVE ng/mL

## 2014-10-20 NOTE — Progress Notes (Signed)
Internal Medicine Clinic Attending  Case discussed with Dr. Genene Churn soon after the resident saw the patient.  We reviewed the resident's history and exam and pertinent patient test results.  I agree with the assessment, diagnosis, and plan of care documented in the resident's note. We discussed "exit" strategy since Dr Genene Churn is taking on controlled substance prescribing which hopefully is temp, until she can get back into to ortho. She has to end of year to save money for ortho copay. If has no appt sch with ortho by then, we will need to eval whether it is appropriate to cont meds indef.

## 2014-10-25 LAB — OXYCODONE, URINE (LC/MS-MS)
Noroxycodone, Ur: 194 ng/mL — ABNORMAL HIGH (ref <50)
OXYMORPHONE, URINE: 89 ng/mL — AB (ref <50)
Oxycodone, ur: >50000 ng/mL — ABNORMAL HIGH (ref <50)

## 2014-10-25 LAB — OPIATES/OPIOIDS (LC/MS-MS)
Codeine Urine: NEGATIVE ng/mL (ref <50)
HYDROCODONE: NEGATIVE ng/mL (ref <50)
Hydromorphone: NEGATIVE ng/mL (ref <50)
Morphine Urine: NEGATIVE ng/mL (ref <50)
NORHYDROCODONE, UR: NEGATIVE ng/mL (ref <50)
NOROXYCODONE, UR: 194 ng/mL — AB (ref <50)
Oxycodone, ur: >50000 ng/mL — ABNORMAL HIGH (ref <50)
Oxymorphone: 89 ng/mL — ABNORMAL HIGH (ref <50)

## 2014-10-25 LAB — CANNABANOIDS (GC/LC/MS), URINE: THC-COOH (GC/LC/MS), ur confirm: 247 ng/mL — ABNORMAL HIGH (ref <5)

## 2014-10-30 ENCOUNTER — Telehealth: Payer: Self-pay | Admitting: Licensed Clinical Social Worker

## 2014-10-30 NOTE — Telephone Encounter (Signed)
Ms. Suder placed call to CSW stating the phone number for the [facility] transportation has been changed and pt was not notified.  CSW received email on 10/30/14, providing new number [phone].  CSW returned call to Ms. Karpel and left message with new phone number for transportation.

## 2014-11-02 ENCOUNTER — Ambulatory Visit (HOSPITAL_COMMUNITY)
Admission: RE | Admit: 2014-11-02 | Discharge: 2014-11-02 | Disposition: A | Discharge status: Home / Self Care | Payer: Medicaid Other | Source: Ambulatory Visit | Attending: Internal Medicine | Admitting: Internal Medicine

## 2014-11-02 DIAGNOSIS — M5116 Intervertebral disc disorders with radiculopathy, lumbar region: Secondary | ICD-10-CM | POA: Insufficient documentation

## 2014-11-02 DIAGNOSIS — M5136 Other intervertebral disc degeneration, lumbar region: Secondary | ICD-10-CM | POA: Diagnosis not present

## 2014-11-02 MED ORDER — GADOBENATE DIMEGLUMINE 529 MG/ML IV SOLN
15.0000 mL | Freq: Once | INTRAVENOUS | Status: AC
  Administered 2014-11-02: 15 mL via INTRAVENOUS

## 2014-11-12 ENCOUNTER — Telehealth: Payer: Self-pay | Admitting: *Deleted

## 2014-11-12 NOTE — Telephone Encounter (Signed)
Pt stopped by clinic for copy MRI report done 11/02/14 - this is for her disability hearing in  2 weeks from 11/03/14. [name] Rayonna Heldman RN 11/12/14 2:15PM

## 2014-11-13 NOTE — Telephone Encounter (Signed)
Talked with pt - copy of MRI report 11/02/14 ready for pt per Dr Genene Churn.

## 2014-11-13 NOTE — Telephone Encounter (Signed)
Sure, that's fine.  Thanks, Dr. Genene Churn

## 2014-11-26 ENCOUNTER — Telehealth: Payer: Self-pay | Admitting: Internal Medicine

## 2014-11-26 NOTE — Telephone Encounter (Signed)
Returned call to [employer] at [employer].  She is in a session and will call back.

## 2014-11-26 NOTE — Telephone Encounter (Signed)
Another call to [facility].  Voice message left

## 2014-11-26 NOTE — Telephone Encounter (Signed)
Rec'd call from Mental health Nurse Anda Kraft) @ the [facility]. She would like to discuss this patient's Pain Management with Dr. Genene Churn.
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
## 2014-11-30 ENCOUNTER — Ambulatory Visit (INDEPENDENT_AMBULATORY_CARE_PROVIDER_SITE_OTHER): Payer: No Typology Code available for payment source | Admitting: Internal Medicine

## 2014-11-30 ENCOUNTER — Encounter: Payer: Self-pay | Admitting: Internal Medicine

## 2014-11-30 VITALS — BP 101/72 | HR 110 | Temp 98.3°F | Ht 67.5 in | Wt 134.1 lb

## 2014-11-30 DIAGNOSIS — R9389 Abnormal findings on diagnostic imaging of other specified body structures: Secondary | ICD-10-CM | POA: Insufficient documentation

## 2014-11-30 DIAGNOSIS — F32A Depression, unspecified: Secondary | ICD-10-CM

## 2014-11-30 DIAGNOSIS — N2889 Other specified disorders of kidney and ureter: Secondary | ICD-10-CM

## 2014-11-30 DIAGNOSIS — M5116 Intervertebral disc disorders with radiculopathy, lumbar region: Secondary | ICD-10-CM

## 2014-11-30 DIAGNOSIS — F329 Major depressive disorder, single episode, unspecified: Secondary | ICD-10-CM

## 2014-11-30 DIAGNOSIS — R937 Abnormal findings on diagnostic imaging of other parts of musculoskeletal system: Secondary | ICD-10-CM

## 2014-11-30 MED ORDER — HYDROMORPHONE HCL 2 MG/ML IJ SOLN: 1.0000 mg | Freq: Once | INTRAMUSCULAR | Status: DC

## 2014-11-30 MED ORDER — OXYCODONE-ACETAMINOPHEN 10-325 MG PO TABS: 1.0000 | ORAL_TABLET | ORAL | Status: DC | PRN

## 2014-11-30 NOTE — Progress Notes (Signed)
   Subjective:    Patient ID: Andrea [name], female    DOB: December 10, 1970, 44 y.o.   MRN: [id number]  HPI  44 yo female with chronic back pain, with lumbar disc prolapse, s/p laminectomy on 04/2014 by Dr. Ronnald Ramp here with back pain.  Last visit she wanted [place] to manage her pain as she could not afford to see Dr. Ronnald Ramp. We gave her 120 tabs of percocet for her pain. Also ordered MRI to check for any worsening of her DJD and whether another surgery would be needed.   MRI showed post op L5-S1 changes and minimal DJD changes on L2-L5 levels. She is concerned about left ileum enhancement on the MRI. Also about b/l renal structures that's poorly visualized.  Still waiting for medicating decision. Will need to go see Dr. Ronnald Ramp after she gets medicaid.   Review of Systems  Constitutional: Negative for fever, chills and fatigue.       Weight loss and loss of appetite  HENT: Negative for sinus pressure and sore throat.   Eyes: Negative for visual disturbance.  Respiratory: Negative for chest tightness and shortness of breath.   Cardiovascular: Negative for chest pain, palpitations and leg swelling.  Gastrointestinal: Negative for nausea, vomiting, abdominal pain, diarrhea and abdominal distention.  Genitourinary: Negative.   Musculoskeletal: Positive for back pain.       Has severe lower back pain with radiation to left leg/thigh/hip.   Neurological: Negative for dizziness and headaches.  Psychiatric/Behavioral: Positive for sleep disturbance. Negative for suicidal ideas.       Objective:   Physical Exam  Constitutional: She is oriented to person, place, and time.  Lying on the right side of the bed, in pain.   HENT:  Head: Normocephalic and atraumatic.  Mouth/Throat: Oropharynx is clear and moist.  Eyes: Conjunctivae are normal. Pupils are equal, round, and reactive to light.  Cardiovascular: Normal rate, regular rhythm and intact distal pulses.  Exam reveals no gallop and no friction rub.     No murmur heard. Pulmonary/Chest: Effort normal and breath sounds normal.  Abdominal: Soft. Bowel sounds are normal. She exhibits no distension. There is no tenderness.  Musculoskeletal: She exhibits tenderness. She exhibits no edema.  Neurological: She is alert and oriented to person, place, and time.  Has pain to movement of the left leg. Has tenderness on left lower back and also on her left hip/thigh/leg area. Has weakness on the left leg.      Filed Vitals:   11/30/14 1440  BP: 101/72  Pulse: 110  Temp: 98.3 F (36.8 C)        Assessment & Plan:  See problem based a&p.

## 2014-11-30 NOTE — Assessment & Plan Note (Signed)
Will get Pelvic MRI to better visualize the ileum enhancement. Will order dilaudid before MRI because she could not tolerate last MRI due to pain while lying on her back.  Will get renal u/s to better characterize the renal lesions, likely cysts.

## 2014-11-30 NOTE — Assessment & Plan Note (Signed)
Follows with behavioral health. Feels depression is due to the pain so trying to fix that first. I explained that we are already treating her pain with high dose medication. She will need to discuss with them about depression treatment.  She was stating her appetite is low and wanted [place] to prescribe something. I explained that her low appetite is likely 2/2 to depression and that she should discuss Remeron with them.

## 2014-11-30 NOTE — Assessment & Plan Note (Signed)
Continues to have pain. Lspine MRI showed post op changes surrounding the S1 and S2 nerve roots and reactive changes. No compression is seen.  Continue Percocet 1 tab q4hr prn #120 tab - filled for 1 month. Will need to f/up with neurosurgery Dr. Ronnald Ramp. Waiting for medicaid decision.

## 2014-11-30 NOTE — Patient Instructions (Signed)
There aren't that many good options for your appetite. The best thing is to take care of your depression. You should talk to your behavioral therapist about depression treatment. Remeron is one option, but I would leave that up to her.  Will get Pelvic MRI and renal ultrasound.

## 2014-12-02 NOTE — Progress Notes (Signed)
Internal Medicine Clinic Attending  Case discussed with Dr. [name] at the time of the visit.  We reviewed the resident's history and exam and pertinent patient test results.  I agree with the assessment, diagnosis, and plan of care documented in the resident's note. 

## 2014-12-11 ENCOUNTER — Encounter (HOSPITAL_BASED_OUTPATIENT_CLINIC_OR_DEPARTMENT_OTHER): Payer: Self-pay

## 2014-12-11 ENCOUNTER — Emergency Department (HOSPITAL_BASED_OUTPATIENT_CLINIC_OR_DEPARTMENT_OTHER)
Admission: EM | Admit: 2014-12-11 | Discharge: 2014-12-11 | Disposition: A | Discharge status: Home / Self Care | Payer: No Typology Code available for payment source | Attending: Emergency Medicine | Admitting: Emergency Medicine

## 2014-12-11 DIAGNOSIS — Z8659 Personal history of other mental and behavioral disorders: Secondary | ICD-10-CM | POA: Insufficient documentation

## 2014-12-11 DIAGNOSIS — G8929 Other chronic pain: Secondary | ICD-10-CM | POA: Insufficient documentation

## 2014-12-11 DIAGNOSIS — R5383 Other fatigue: Secondary | ICD-10-CM | POA: Insufficient documentation

## 2014-12-11 DIAGNOSIS — Z8742 Personal history of other diseases of the female genital tract: Secondary | ICD-10-CM | POA: Insufficient documentation

## 2014-12-11 DIAGNOSIS — Z720 Tobacco use: Secondary | ICD-10-CM | POA: Insufficient documentation

## 2014-12-11 DIAGNOSIS — I209 Angina pectoris, unspecified: Secondary | ICD-10-CM | POA: Insufficient documentation

## 2014-12-11 DIAGNOSIS — M25552 Pain in left hip: Secondary | ICD-10-CM | POA: Insufficient documentation

## 2014-12-11 MED ORDER — HYDROMORPHONE HCL 1 MG/ML IJ SOLN
1.0000 mg | Freq: Once | INTRAMUSCULAR | Status: AC
  Administered 2014-12-11: 1 mg via INTRAMUSCULAR
  Filled 2014-12-11: qty 1

## 2014-12-11 NOTE — ED Notes (Signed)
Pt reports chronic back and hip pain since 2012 and pain has been worse since July 11,2016 after MRI.  Taking Percocet without relief and followed by pain management.

## 2014-12-11 NOTE — Discharge Instructions (Signed)

## 2014-12-11 NOTE — ED Provider Notes (Signed)
CSN: [id number]     Arrival date & time 12/11/14  1504 History   First MD Initiated Contact with Patient 12/11/14 1543     Chief Complaint  Patient presents with  . Hip Pain     (Consider location/radiation/quality/duration/timing/severity/associated sxs/prior Treatment) HPI Comments: Patient presents with pain in her left back. She states she's had chronic back and hip pain since about 2012. She had surgery in January on her L5-S1 area. She states she had a recent MRI of her lumbar spine which she says exacerbated the pain given that she had a lay straight on her back for about an hour This was done on July 11. She's being followed currently by the [facility]. She's prescribed Percocet 10 mg tablets. She was recently prescribed 120 tablets on August 8. She states the Percocet is not controlling her pain. She has ongoing pain to her left posterior hip area. She feels like it's swollen. She denies any radiation down her leg. She denies any numbness or weakness to her leg. She denies any loss of bowel or bladder control.   Patient is a 44 y.o. female presenting with hip pain.  Hip Pain Pertinent negatives include no chest pain, no abdominal pain, no headaches and no shortness of breath.    Past Medical History  Diagnosis Date  . Low back pain radiating to left leg 11/29/2010  . Dysmenorrhea 12/06/2009    Qualifier: Diagnosis of  By: [name] RN, Regino Schultze    . Chronic back pain   . Family history of adverse reaction to anesthesia     mother stopped breathing after surgery  . Shortness of breath dyspnea     with exertion  . Anxiety   . Depression   . Headache     stress  . Anginal pain     chest pain not sure what related to   Past Surgical History  Procedure Laterality Date  . Laproscopy surgery to check her tubes    . Lumbar laminectomy/decompression microdiscectomy Left 05/01/2014    Procedure: Microdiscectomy - L5-S1 - left;  Surgeon: Eustace Moore, MD;  Location: [place];  Service: Neurosurgery;  Laterality: Left;   Family History  Problem Relation Age of Onset  . Diabetes Mother   . Hypertension Mother   . Hypertension Father   . Heart attack Maternal Grandfather   . Hypertension Maternal Grandfather   . Heart attack Paternal Grandfather   . Hypertension Paternal Grandfather    Social History  Substance Use Topics  . Smoking status: Current Every Day Smoker -- 0.10 packs/day for 20 years    Types: Cigarettes  . Smokeless tobacco: None     Comment: Smoking very little.  . Alcohol Use: No   OB History    No data available     Review of Systems  Constitutional: Positive for fatigue. Negative for fever, chills and diaphoresis.  HENT: Negative for congestion, rhinorrhea and sneezing.   Eyes: Negative.   Respiratory: Negative for cough, chest tightness and shortness of breath.   Cardiovascular: Negative for chest pain and leg swelling.  Gastrointestinal: Negative for nausea, vomiting, abdominal pain, diarrhea and blood in stool.  Genitourinary: Negative for frequency, hematuria, flank pain and difficulty urinating.  Musculoskeletal: Positive for arthralgias. Negative for back pain.  Skin: Negative for rash.  Neurological: Negative for dizziness, speech difficulty, weakness, numbness and headaches.      Allergies  Morphine and related and Hydrocodone  Home Medications   Prior to  Admission medications   Medication Sig Start Date End Date Taking? Authorizing Provider  oxyCODONE-acetaminophen (PERCOCET) 10-325 MG per tablet Take 1 tablet by mouth every 4 (four) hours as needed for pain. 11/30/14   [name] [name], MD   BP 158/109 mmHg  Pulse 145  Temp(Src) 98.2 F (36.8 C) (Oral)  Resp 20  Ht '5\' 6"'$  (1.676 m)  Wt 132 lb (59.875 kg)  BMI 21.32 kg/m2 Physical Exam  Constitutional: She is oriented to person, place, and time. She appears well-developed and well-nourished.  HENT:  Head: Normocephalic and atraumatic.  Eyes: Pupils are equal,  round, and reactive to light.  Neck: Normal range of motion. Neck supple.  Cardiovascular: Normal rate, regular rhythm and normal heart sounds.   Pulmonary/Chest: Effort normal and breath sounds normal. No respiratory distress. She has no wheezes. She has no rales. She exhibits no tenderness.  Abdominal: Soft. Bowel sounds are normal. There is no tenderness. There is no rebound and no guarding.  Musculoskeletal: Normal range of motion. She exhibits no edema.  Positive tenderness to the left posterior hip. There is some firmness on palpation. There is no induration or fluctuance. No abscess or evidence of cellulitis is noted. No skin rashes. She has no pain to the knee. She has normal sensation and motor function in the left leg. Pedal pulses are intact. Her pain is primarily to the posterior hip versus the lumbar spine.  Lymphadenopathy:    She has no cervical adenopathy.  Neurological: She is alert and oriented to person, place, and time.  Skin: Skin is warm and dry. No rash noted.  Psychiatric: She has a normal mood and affect.    ED Course  Procedures (including critical care time) Labs Review Labs Reviewed - No data to display  Imaging Review No results found. I have personally reviewed and evaluated these images and lab results as part of my medical decision-making.   EKG Interpretation None      MDM   Final diagnoses:  Chronic hip pain, left    Patient with exacerbation of some chronic low back and hip pain. I did review the MRI results from July and there was an abnormal enhancing area to her left ilium. She's scheduled to have an MRI of the pelvis on August 22. She has no neurologic deficits or signs of cauda equina. I don't see any evidence of infection or skin lesions. She was given a shot of Dilaudid in the ED and advised that we don't change her chronic pain medications. She will need to follow-up with outpatient clinic for this.    Malvin Johns, MD 12/11/14 [phone]

## 2014-12-14 ENCOUNTER — Other Ambulatory Visit: Payer: Self-pay | Admitting: Student in an Organized Health Care Education/Training Program

## 2014-12-14 ENCOUNTER — Ambulatory Visit (HOSPITAL_COMMUNITY)
Admission: RE | Admit: 2014-12-14 | Discharge: 2014-12-14 | Disposition: A | Discharge status: Home / Self Care | Payer: Self-pay | Source: Ambulatory Visit | Attending: Internal Medicine | Admitting: Internal Medicine

## 2014-12-14 DIAGNOSIS — M5137 Other intervertebral disc degeneration, lumbosacral region: Secondary | ICD-10-CM | POA: Insufficient documentation

## 2014-12-14 DIAGNOSIS — N2889 Other specified disorders of kidney and ureter: Secondary | ICD-10-CM

## 2014-12-14 DIAGNOSIS — M899 Disorder of bone, unspecified: Secondary | ICD-10-CM | POA: Insufficient documentation

## 2014-12-14 DIAGNOSIS — R52 Pain, unspecified: Secondary | ICD-10-CM | POA: Insufficient documentation

## 2014-12-14 DIAGNOSIS — R9389 Abnormal findings on diagnostic imaging of other specified body structures: Secondary | ICD-10-CM

## 2014-12-14 DIAGNOSIS — N281 Cyst of kidney, acquired: Secondary | ICD-10-CM | POA: Insufficient documentation

## 2014-12-14 MED ORDER — GADOBENATE DIMEGLUMINE 529 MG/ML IV SOLN
15.0000 mL | Freq: Once | INTRAVENOUS | Status: AC | PRN
  Administered 2014-12-14: 15 mL via INTRAVENOUS

## 2014-12-14 MED ORDER — HYDROMORPHONE HCL 1 MG/ML IJ SOLN
INTRAMUSCULAR | Status: AC
  Administered 2014-12-14: 1 mg via INTRAVENOUS
  Filled 2014-12-14: qty 1

## 2014-12-14 MED ORDER — HYDROMORPHONE HCL 1 MG/ML IJ SOLN
1.0000 mg | Freq: Once | INTRAMUSCULAR | Status: AC
  Administered 2014-12-14: 1 mg via INTRAVENOUS
  Filled 2014-12-14: qty 1

## 2014-12-15 ENCOUNTER — Other Ambulatory Visit: Payer: Self-pay | Admitting: Internal Medicine

## 2014-12-15 DIAGNOSIS — M898X9 Other specified disorders of bone, unspecified site: Secondary | ICD-10-CM

## 2014-12-15 NOTE — Addendum Note (Signed)
Addended by: [name] on: 12/15/2014 04:20 PM   Modules accepted: Orders

## 2014-12-15 NOTE — Progress Notes (Signed)
Discussed the results of the pelvic MRI with the radiologist in Dr. Wilber Bihari presence. The left ilium lesion seen on pelvic MRI on 12/14/2014 was not seen in the past on lumbar MRI 01/2014. This is new within this time period. This is likely a metastatic lesion or a primary lesion which is highly active. Differential include primary bone tumors, plasmacytoma, inflammatory process, or mets.   We will proceed with IR guided biopsy of the lesion.  Discussed this with the patient.

## 2014-12-16 ENCOUNTER — Telehealth: Payer: Self-pay | Admitting: *Deleted

## 2014-12-16 NOTE — Telephone Encounter (Signed)
Called radiology to schedule CT Biopsy - was told all bx's have to be reviewed; this is in review and they will call pt to schedule.

## 2014-12-17 ENCOUNTER — Other Ambulatory Visit: Payer: Self-pay | Admitting: Internal Medicine

## 2014-12-17 ENCOUNTER — Ambulatory Visit: Payer: No Typology Code available for payment source | Admitting: Internal Medicine

## 2014-12-17 ENCOUNTER — Telehealth: Payer: Self-pay | Admitting: *Deleted

## 2014-12-17 DIAGNOSIS — M5116 Intervertebral disc disorders with radiculopathy, lumbar region: Secondary | ICD-10-CM

## 2014-12-17 DIAGNOSIS — R9389 Abnormal findings on diagnostic imaging of other specified body structures: Secondary | ICD-10-CM

## 2014-12-17 MED ORDER — OXYCODONE HCL ER 20 MG PO T12A: 20.0000 mg | EXTENDED_RELEASE_TABLET | Freq: Two times a day (BID) | ORAL | Status: DC

## 2014-12-17 MED ORDER — OXYCODONE HCL 5 MG PO TABS: 5.0000 mg | ORAL_TABLET | ORAL | Status: DC | PRN

## 2014-12-17 MED ORDER — OXYCODONE HCL 5 MG PO TABS
5.0000 mg | ORAL_TABLET | ORAL | Status: DC | PRN
Start: 2014-12-17 — End: 2014-12-17

## 2014-12-17 NOTE — Progress Notes (Signed)
patient called in clinic. having severe pain. scheduled for IR biopsy on 12/21/14 AM. has appt with me again on 01/04/15. currently on percoet 10-'325mg'$  tablet to be taken every 4-6 hours. she is not getting enough relief with that. asking for lidocaine cream or anything else to help.  Discussed with Dr. Beryle Beams.  we will d/c her current percocet.  will supply her for 20 days (just until she sees me again on 01/04/15)  will start: oxycontin '20mg'$  q12hour - #40 tabs will start: oxycodone '5mg'$  1-2 tabs every 4 hour prn - #240 tabs.  will do further refill next visit based on her response and biopsy results.   also asked for her MRI results, which we will provider for her.

## 2014-12-17 NOTE — Progress Notes (Signed)
Pt picked up Oxy IR and Oxycontin rxs - instructed to stop Percocet; stated she has already threw them away. Also x-ray report given to pt.

## 2014-12-17 NOTE — Telephone Encounter (Signed)
Received call from patient-rx for Oxycontin 20 mg 12hr tabs cost was over $200. Wanted to know if there was something cheaper available.  [place] to see what long acting pain meds are available with the orange card and there are none.  Discussed with pt's pcp, pt should continue to take her percocet and once finished, start rx oxycodone '5mg'$  tabs.  Pt is currently at the pharmacy and the pharmacist will relay the message to patient. Pt also instructed to keep her upcomimg appt.Despina Hidden Cassady8/25/20162:23 PM  Call from pt stating that she has already disposed of her percocet in the fireplace and asked for a lidocaine rx.  Contacted pharmacy to inquire on the cost and it will still be too much for patient to afford at this time.  Pt was instructed to take the oxycodone '5mg'$  tabs 1-2 tabs every 4 hours #240 as prescribed.  Pt instructed that she may take an extra pill here and there for the pain if needed.  Pt will follow up with pcp as previously scheduled.Despina Hidden Cassady8/25/20163:00 PM

## 2014-12-18 ENCOUNTER — Other Ambulatory Visit: Payer: Self-pay | Admitting: Radiology

## 2014-12-21 ENCOUNTER — Encounter (HOSPITAL_COMMUNITY): Payer: Self-pay

## 2014-12-21 ENCOUNTER — Ambulatory Visit (HOSPITAL_COMMUNITY)
Admission: RE | Admit: 2014-12-21 | Discharge: 2014-12-21 | Disposition: A | Discharge status: Home / Self Care | Payer: Medicaid Other | Source: Ambulatory Visit | Attending: Internal Medicine | Admitting: Internal Medicine

## 2014-12-21 DIAGNOSIS — M899 Disorder of bone, unspecified: Secondary | ICD-10-CM | POA: Diagnosis present

## 2014-12-21 DIAGNOSIS — C7951 Secondary malignant neoplasm of bone: Secondary | ICD-10-CM | POA: Insufficient documentation

## 2014-12-21 DIAGNOSIS — Z833 Family history of diabetes mellitus: Secondary | ICD-10-CM | POA: Diagnosis not present

## 2014-12-21 DIAGNOSIS — C801 Malignant (primary) neoplasm, unspecified: Secondary | ICD-10-CM | POA: Insufficient documentation

## 2014-12-21 DIAGNOSIS — F17210 Nicotine dependence, cigarettes, uncomplicated: Secondary | ICD-10-CM | POA: Diagnosis not present

## 2014-12-21 DIAGNOSIS — Z8249 Family history of ischemic heart disease and other diseases of the circulatory system: Secondary | ICD-10-CM | POA: Diagnosis not present

## 2014-12-21 DIAGNOSIS — M898X9 Other specified disorders of bone, unspecified site: Secondary | ICD-10-CM | POA: Insufficient documentation

## 2014-12-21 LAB — CBC
HCT: 35.4 % — ABNORMAL LOW (ref 36.0–46.0)
HEMOGLOBIN: 12.5 g/dL (ref 12.0–15.0)
MCH: 33 pg (ref 26.0–34.0)
MCHC: 35.3 g/dL (ref 30.0–36.0)
MCV: 93.4 fL (ref 78.0–100.0)
PLATELETS: 361 10*3/uL (ref 150–400)
RBC: 3.79 MIL/uL — AB (ref 3.87–5.11)
RDW: 12.7 % (ref 11.5–15.5)
WBC: 5.2 10*3/uL (ref 4.0–10.5)

## 2014-12-21 LAB — PROTIME-INR
INR: 1.12 (ref 0.00–1.49)
PROTHROMBIN TIME: 14.6 s (ref 11.6–15.2)

## 2014-12-21 LAB — APTT: APTT: 36 s (ref 24–37)

## 2014-12-21 MED ORDER — OXYCODONE-ACETAMINOPHEN 5-325 MG PO TABS
2.0000 | ORAL_TABLET | Freq: Once | ORAL | Status: AC
  Administered 2014-12-21: 2 via ORAL
  Filled 2014-12-21: qty 2

## 2014-12-21 MED ORDER — MIDAZOLAM HCL 2 MG/2ML IJ SOLN
INTRAMUSCULAR | Status: AC | PRN
  Administered 2014-12-21: 0.5 mg via INTRAVENOUS
  Administered 2014-12-21: 1 mg via INTRAVENOUS
  Administered 2014-12-21: 0.5 mg via INTRAVENOUS

## 2014-12-21 MED ORDER — FENTANYL CITRATE (PF) 100 MCG/2ML IJ SOLN
INTRAMUSCULAR | Status: AC | PRN
  Administered 2014-12-21 (×2): 25 ug via INTRAVENOUS
  Administered 2014-12-21: 50 ug via INTRAVENOUS
  Administered 2014-12-21: 25 ug via INTRAVENOUS
  Administered 2014-12-21: 50 ug via INTRAVENOUS

## 2014-12-21 MED ORDER — LIDOCAINE HCL 1 % IJ SOLN
INTRAMUSCULAR | Status: AC
  Filled 2014-12-21: qty 20

## 2014-12-21 MED ORDER — MIDAZOLAM HCL 2 MG/2ML IJ SOLN
INTRAMUSCULAR | Status: AC
  Filled 2014-12-21: qty 4

## 2014-12-21 MED ORDER — HYDROCODONE-ACETAMINOPHEN 5-325 MG PO TABS
1.0000 | ORAL_TABLET | ORAL | Status: DC | PRN
  Filled 2014-12-21: qty 2

## 2014-12-21 MED ORDER — OXYCODONE-ACETAMINOPHEN 5-325 MG PO TABS
ORAL_TABLET | ORAL | Status: AC
  Filled 2014-12-21: qty 2

## 2014-12-21 MED ORDER — FENTANYL CITRATE (PF) 100 MCG/2ML IJ SOLN
INTRAMUSCULAR | Status: AC
  Filled 2014-12-21: qty 4

## 2014-12-21 MED ORDER — SODIUM CHLORIDE 0.9 % IV SOLN: INTRAVENOUS | Status: DC

## 2014-12-21 NOTE — Sedation Documentation (Signed)
Patient is resting

## 2014-12-21 NOTE — Sedation Documentation (Signed)
Pt still complains of pain. States she feels better after moving to stretcher in different position. Report called to [facility] RN

## 2014-12-21 NOTE — Sedation Documentation (Signed)
Patient is in pain

## 2014-12-21 NOTE — Procedures (Signed)
CT 18g core bx L ilium lesion for surg path and microbiol No complication No blood loss. See complete dictation in [facility].

## 2014-12-21 NOTE — Sedation Documentation (Signed)
Patient c/o pain.  

## 2014-12-21 NOTE — Sedation Documentation (Signed)
Patient is resting, complains of constant pain

## 2014-12-21 NOTE — H&P (Signed)
Chief Complaint: Patient was seen in consultation today for left hip pain/mass at the request of [place]  Referring Physician(s): [name],[name]  History of Present Illness: Andrea [name] is a 44 y.o. female   Pt has noted Left hip pain off and on x 2 yrs Worsening x 2 months Now "10 /10" Work up with MRI 8/22 reveals  IMPRESSION: 1. Mildly expansile 2.7 x 2.9 x 4 cm mass in the left posterior ilium with avid enhancement most concerning for malignancy with adjacent muscle edema in the left paraspinal muscle. This may represent metastatic disease versus multiple myeloma versus postsurgical changes or infection given that the patient has had prior intervention along the left side at L5-S1. Correlate with laboratory values.  Now scheduled for biopsy of same  Past Medical History  Diagnosis Date  . Low back pain radiating to left leg 11/29/2010  . Dysmenorrhea 12/06/2009    Qualifier: Diagnosis of  By: [name] RN, Regino Schultze    . Chronic back pain   . Family history of adverse reaction to anesthesia     mother stopped breathing after surgery  . Shortness of breath dyspnea     with exertion  . Anxiety   . Depression   . Headache     stress  . Anginal pain     chest pain not sure what related to    Past Surgical History  Procedure Laterality Date  . Laproscopy surgery to check her tubes    . Lumbar laminectomy/decompression microdiscectomy Left 05/01/2014    Procedure: Microdiscectomy - L5-S1 - left;  Surgeon: Eustace Moore, MD;  Location: [place];  Service: Neurosurgery;  Laterality: Left;    Allergies: Morphine and related and Hydrocodone  Medications: Prior to Admission medications   Medication Sig Start Date End Date Taking? Authorizing Provider  Multiple Vitamins-Minerals (MULTIVITAMIN & MINERAL PO) Take 1 tablet by mouth daily.   Yes Historical Provider, MD  oxyCODONE (ROXICODONE) 5 MG immediate release tablet Take 1-2 tablets (5-10 mg total) by mouth every 4  (four) hours as needed. 12/17/14 12/17/15 Yes [name] [name], MD  Prenatal Vit-Fe Fumarate-FA (PRENATAL VITAMIN PO) Take 1 tablet by mouth daily.   Yes Historical Provider, MD  OxyCODONE (OXYCONTIN) 20 mg T12A 12 hr tablet Take 1 tablet (20 mg total) by mouth every 12 (twelve) hours. Patient not taking: Reported on 12/18/2014 12/17/14   [name], MD     Family History  Problem Relation Age of Onset  . Diabetes Mother   . Hypertension Mother   . Hypertension Father   . Heart attack Maternal Grandfather   . Hypertension Maternal Grandfather   . Heart attack Paternal Grandfather   . Hypertension Paternal Grandfather     Social History   Social History  . Marital Status: Single    Spouse Name: N/A  . Number of Children: N/A  . Years of Education: N/A   Social History Main Topics  . Smoking status: Current Every Day Smoker -- 0.10 packs/day for 20 years    Types: Cigarettes  . Smokeless tobacco: None     Comment: Smoking very little.  . Alcohol Use: No  . Drug Use: No     Comment: previoulsly.  [name] Sexual Activity: Not Currently   Other Topics Concern  . None   Social History Narrative     Review of Systems: A 12 point ROS discussed and pertinent positives are indicated in the HPI above.  All other systems are negative.  Review  of Systems  Constitutional: Positive for activity change and appetite change. Negative for fatigue.  Respiratory: Negative for cough and shortness of breath.   Gastrointestinal: Negative for abdominal pain.  Musculoskeletal: Positive for back pain.  Neurological: Positive for weakness.  Psychiatric/Behavioral: Negative for behavioral problems and confusion.    Vital Signs: BP 99/64 mmHg  Pulse 86  Temp(Src) 97.5 F (36.4 C)  Resp 20  Ht 5' 6"  (1.676 m)  Wt 130 lb (58.968 kg)  BMI 20.99 kg/m2  SpO2 100%  Physical Exam  Constitutional: She is oriented to person, place, and time.  Cardiovascular: Normal rate, regular rhythm and normal  heart sounds.   Pulmonary/Chest: She has no wheezes.  Abdominal: Soft. Bowel sounds are normal. There is no tenderness.  Musculoskeletal: Normal range of motion. She exhibits tenderness.  Severe Left hip to thigh pain  Neurological: She is alert and oriented to person, place, and time.  Skin: Skin is warm and dry.  Psychiatric: She has a normal mood and affect. Her behavior is normal. Judgment and thought content normal.  Nursing note and vitals reviewed.   Mallampati Score:  MD Evaluation Airway: WNL Heart: WNL Abdomen: WNL Chest/ Lungs: WNL ASA  Classification: 3 Mallampati/Airway Score: One  Imaging: Mr Pelvis W Wo Contrast  12/14/2014   CLINICAL DATA:  Previous back surgery.  Extreme pain.  EXAM: MRI PELVIS WITHOUT AND WITH CONTRAST  TECHNIQUE: Multiplanar multisequence MR imaging of the pelvis was performed both before and after administration of intravenous contrast.  CONTRAST:  75m MULTIHANCE GADOBENATE DIMEGLUMINE 529 MG/ML IV SOLN  COMPARISON:  None.  FINDINGS: Bone  There is 2.7 x 2.9 x 4 cm expansile bone lesion in the left posterior ilium with avid enhancement on post-contrast imaging. There is adjacent reactive marrow edema in the ilium. There is soft tissue edema in the adjacent left paraspinal muscle.  There is no hip fracture, dislocation or avascular necrosis.  There is severe degenerative disc disease with disc height loss at L5-S1 with a broad-based disc bulge and bilateral facet arthropathy.  Alignment  Normal. No subluxation.  Dysplasia  None.  Joint effusion  None.  Labrum  Normal. No labral tear.  Cartilage  Femoral cartilage: Normal.  Acetabular cartilage: Normal.  Capsule and ligaments  Normal.  Muscles and Tendons  Flexors: Normal.  Extensors: Normal.  Abductors: Normal.  Adductors: Normal.  Rotators: Normal.  Hamstrings: Normal.  Other Findings  None  Viscera  Normal. No abnormality seen in pelvis. No lymphadenopathy. No free fluid in the pelvis.  IMPRESSION: 1.  Mildly expansile 2.7 x 2.9 x 4 cm mass in the left posterior ilium with avid enhancement most concerning for malignancy with adjacent muscle edema in the left paraspinal muscle. This may represent metastatic disease versus multiple myeloma versus postsurgical changes or infection given that the patient has had prior intervention along the left side at L5-S1. Correlate with laboratory values.   Electronically Signed   By: HKathreen Devoid  On: 12/14/2014 11:23   [place]al  12/14/2014   CLINICAL DATA:  Renal lesions on MRI.  EXAM: RENAL / URINARY TRACT ULTRASOUND COMPLETE  COMPARISON:  None.  FINDINGS: Right Kidney:  Length: 11.0 cm. Echogenicity within normal limits. No hydronephrosis visualized. 1.2 cm simple cyst.  Left Kidney:  Length: 10.7 cm. Echogenicity within normal limits. No hydronephrosis visualized. 1.0 cm simple cyst .  Bladder:  Appears normal for degree of bladder distention.  IMPRESSION: Bilateral simple renal cysts attending for MRI abnormality, exam  otherwise normal. No significant renal lesions identified.   Electronically Signed   By: Marcello Moores  Register   On: 12/14/2014 11:30    Labs:  CBC:  Recent Labs  04/27/14 0927  WBC 7.2  HGB 13.8  HCT 40.9  PLT 285    COAGS: No results for input(s): INR, APTT in the last 8760 hours.  BMP:  Recent Labs  01/08/14 1637  NA 137  K 4.0  CL 104  CO2 25  GLUCOSE 77  BUN 7  CALCIUM 9.9  CREATININE 0.85  GFRNONAA 85  GFRAA >89    LIVER FUNCTION TESTS: No results for input(s): BILITOT, AST, ALT, ALKPHOS, PROT, ALBUMIN in the last 8760 hours.  TUMOR MARKERS: No results for input(s): AFPTM, CEA, CA199, CHROMGRNA in the last 8760 hours.  Assessment and Plan:  Left hip pain Abnormal MRI Left posterior ilium mass Now scheduled for biopsy of same Risks and Benefits discussed with the patient including, but not limited to bleeding, infection, damage to adjacent structures or low yield requiring additional tests. All of the  patient's questions were answered, patient is agreeable to proceed. Consent signed and in chart.   Thank you for this interesting consult.  I greatly enjoyed meeting [employer] and look forward to participating in their care.  A copy of this report was sent to the requesting provider on this date.  Signed: Teagan Heidrick A 12/21/2014, 8:16 AM   I spent a total of  30 Minutes   in face to face in clinical consultation, greater than 50% of which was counseling/coordinating care for Lt ilium mas bx

## 2014-12-21 NOTE — Discharge Instructions (Signed)
Needle Biopsy °Care After °These instructions give you information on caring for yourself after your procedure. Your doctor may also give you more specific instructions. Call your doctor if you have any problems or questions after your procedure. °HOME CARE °· Rest for 4 hours after your biopsy, except for getting up to go to the bathroom or as told. °· Keep the places where the needles were put in clean and dry. °¨ Do not put powder or lotion on the sites. °¨ Do not shower until 24 hours after the test. Remove all bandages (dressings) before showering. °¨ Remove all bandages at least once every day. Gently clean the sites with soap and water. Keep putting a new bandage on until the skin is closed. °Finding out the results of your test °Ask your doctor when your test results will be ready. Make sure you follow up and get the test results. °GET HELP RIGHT AWAY IF:  °· You have shortness of breath or trouble breathing. °· You have pain or cramping in your belly (abdomen). °· You feel sick to your stomach (nauseous) or throw up (vomit). °· Any of the places where the needles were put in: °¨ Are puffy (swollen) or red. °¨ Are sore or hot to the touch. °¨ Are draining yellowish-white fluid (pus). °¨ Are bleeding after 10 minutes of pressing down on the site. Have someone keep pressing on any place that is bleeding until you see a doctor. °· You have any unusual pain that will not stop. °· You have a fever. °If you go to the emergency room, tell the nurse that you had a biopsy. Take this paper with you to show the nurse. °MAKE SURE YOU:  °· Understand these instructions. °· Will watch your condition. °· Will get help right away if you are not doing well or get worse. °Document Released: 03/23/2008 Document Revised: 07/03/2011 Document Reviewed: 03/23/2008 °ExitCare® Patient Information ©2015 [employer]. This information is not intended to replace advice given to you by your health care provider. Make sure you discuss  any questions you have with your health care provider. ° °

## 2014-12-23 ENCOUNTER — Other Ambulatory Visit: Payer: Self-pay | Admitting: Internal Medicine

## 2014-12-23 ENCOUNTER — Telehealth: Payer: Self-pay | Admitting: *Deleted

## 2014-12-23 ENCOUNTER — Telehealth: Payer: Self-pay | Admitting: Internal Medicine

## 2014-12-23 DIAGNOSIS — M898X9 Other specified disorders of bone, unspecified site: Secondary | ICD-10-CM

## 2014-12-23 MED ORDER — OXYCODONE HCL 10 MG PO TABS: 10.0000 mg | ORAL_TABLET | ORAL | Status: DC | PRN

## 2014-12-23 NOTE — Telephone Encounter (Signed)
Oncology Nurse Navigator Documentation  Oncology Nurse Navigator Flowsheets 12/23/2014  Referral date to RadOnc/MedOnc 12/23/2014  Navigator Encounter Type Introductory phone call/I received a referral from Dr. Waymon Budge today.  I spoke with Dr. Julien Nordmann about when to schedule.  I called patient to schedule.  I was unable to reach or even leave a vm message with her.    Interventions Coordination of Care  Coordination of Care MD Appointments  Time Spent with Patient 15

## 2014-12-23 NOTE — Telephone Encounter (Signed)
Pt want to know if her pain Rx is ready for pick up.

## 2014-12-23 NOTE — Progress Notes (Unsigned)
Called patient about her biopsy results showing lung adenocarcinoma. Unfortunately, I don't have appt to see her until 01/04/15. Discussed case with Dr. Earnest Bailey and Dr. Dareen Piano. We are in the process of trying to get her in at the cancer center for further workup.  Will call pathology and request ALK and EGFR staining.   Patient requested more pain med. Currently taking her oxy IR 60m 2 tabs every 4 hours (gave her #240 on 12/17/14) until she sees me. She states her pain is not controlled with this.  Will write for oxy IR 118mq4hr prn #30 tabs until she sees me to better control her pain. Could not afford long acting oxycontin.

## 2014-12-23 NOTE — Telephone Encounter (Signed)
Andrea [name] from [facility]  [phone]  called about getting another oxy IR '10mg'$  #30 today when she got oxy IR '5mg'$  2 tabs every 4 hours #240 on 12/17/14. Talked with Dr Daryll Drown OK to fill with new dx. [name] aware.  [name] [name] RN 12/23/14 5:15PM

## 2014-12-24 NOTE — Telephone Encounter (Signed)
Andrea [name] from [place] called about written Rx. Pt had picked up Rx.

## 2014-12-25 ENCOUNTER — Telehealth: Payer: Self-pay | Admitting: *Deleted

## 2014-12-25 DIAGNOSIS — M898X9 Other specified disorders of bone, unspecified site: Secondary | ICD-10-CM

## 2014-12-25 LAB — TISSUE CULTURE
Culture: NO GROWTH
Gram Stain: NONE SEEN

## 2014-12-25 NOTE — Telephone Encounter (Signed)
Oncology Nurse Navigator Documentation  Oncology Nurse Navigator Flowsheets 12/25/2014  Navigator Encounter Type Introductory phone call/I called and left patient a vm message for appt with Dr. Julien Nordmann on 12/29/14 arrive at 3:15.  I also left my name and phone number to call with questions or concerns  Interventions Coordination of Care  Coordination of Care MD Appointments  Time Spent with Patient 15

## 2014-12-29 ENCOUNTER — Encounter: Payer: Self-pay | Admitting: Internal Medicine

## 2014-12-29 ENCOUNTER — Inpatient Hospital Stay (HOSPITAL_COMMUNITY)
Admission: AD | Admit: 2014-12-29 | Discharge: 2015-01-04 | DRG: 543 | Disposition: A | Discharge status: Home / Self Care | Payer: Medicaid Other | Source: Ambulatory Visit | Attending: Internal Medicine | Admitting: Internal Medicine

## 2014-12-29 ENCOUNTER — Ambulatory Visit (HOSPITAL_BASED_OUTPATIENT_CLINIC_OR_DEPARTMENT_OTHER): Payer: No Typology Code available for payment source | Admitting: Internal Medicine

## 2014-12-29 ENCOUNTER — Inpatient Hospital Stay (HOSPITAL_COMMUNITY): Payer: Medicaid Other

## 2014-12-29 ENCOUNTER — Encounter (HOSPITAL_COMMUNITY): Payer: Self-pay | Admitting: *Deleted

## 2014-12-29 ENCOUNTER — Other Ambulatory Visit (HOSPITAL_BASED_OUTPATIENT_CLINIC_OR_DEPARTMENT_OTHER): Payer: No Typology Code available for payment source

## 2014-12-29 VITALS — BP 99/61 | HR 100 | Temp 98.0°F | Resp 19 | Ht 66.0 in | Wt 121.1 lb

## 2014-12-29 DIAGNOSIS — C3490 Malignant neoplasm of unspecified part of unspecified bronchus or lung: Secondary | ICD-10-CM | POA: Diagnosis not present

## 2014-12-29 DIAGNOSIS — R9389 Abnormal findings on diagnostic imaging of other specified body structures: Secondary | ICD-10-CM

## 2014-12-29 DIAGNOSIS — C7951 Secondary malignant neoplasm of bone: Secondary | ICD-10-CM

## 2014-12-29 DIAGNOSIS — Z8249 Family history of ischemic heart disease and other diseases of the circulatory system: Secondary | ICD-10-CM | POA: Diagnosis not present

## 2014-12-29 DIAGNOSIS — E860 Dehydration: Secondary | ICD-10-CM | POA: Diagnosis present

## 2014-12-29 DIAGNOSIS — R531 Weakness: Secondary | ICD-10-CM | POA: Diagnosis not present

## 2014-12-29 DIAGNOSIS — R52 Pain, unspecified: Secondary | ICD-10-CM

## 2014-12-29 DIAGNOSIS — G8929 Other chronic pain: Secondary | ICD-10-CM | POA: Diagnosis present

## 2014-12-29 DIAGNOSIS — Z510 Encounter for antineoplastic radiation therapy: Secondary | ICD-10-CM | POA: Diagnosis present

## 2014-12-29 DIAGNOSIS — M5116 Intervertebral disc disorders with radiculopathy, lumbar region: Secondary | ICD-10-CM

## 2014-12-29 DIAGNOSIS — E440 Moderate protein-calorie malnutrition: Secondary | ICD-10-CM | POA: Diagnosis present

## 2014-12-29 DIAGNOSIS — C3491 Malignant neoplasm of unspecified part of right bronchus or lung: Secondary | ICD-10-CM

## 2014-12-29 DIAGNOSIS — E876 Hypokalemia: Secondary | ICD-10-CM | POA: Diagnosis present

## 2014-12-29 DIAGNOSIS — Z79899 Other long term (current) drug therapy: Secondary | ICD-10-CM | POA: Diagnosis not present

## 2014-12-29 DIAGNOSIS — Z833 Family history of diabetes mellitus: Secondary | ICD-10-CM

## 2014-12-29 DIAGNOSIS — Z885 Allergy status to narcotic agent status: Secondary | ICD-10-CM | POA: Diagnosis not present

## 2014-12-29 DIAGNOSIS — Z681 Body mass index (BMI) 19 or less, adult: Secondary | ICD-10-CM | POA: Diagnosis not present

## 2014-12-29 DIAGNOSIS — K5900 Constipation, unspecified: Secondary | ICD-10-CM | POA: Diagnosis present

## 2014-12-29 DIAGNOSIS — M545 Low back pain: Secondary | ICD-10-CM | POA: Diagnosis present

## 2014-12-29 DIAGNOSIS — C3400 Malignant neoplasm of unspecified main bronchus: Secondary | ICD-10-CM

## 2014-12-29 DIAGNOSIS — F329 Major depressive disorder, single episode, unspecified: Secondary | ICD-10-CM | POA: Diagnosis present

## 2014-12-29 DIAGNOSIS — M25552 Pain in left hip: Secondary | ICD-10-CM | POA: Diagnosis present

## 2014-12-29 DIAGNOSIS — M898X9 Other specified disorders of bone, unspecified site: Secondary | ICD-10-CM

## 2014-12-29 DIAGNOSIS — Z129 Encounter for screening for malignant neoplasm, site unspecified: Secondary | ICD-10-CM

## 2014-12-29 DIAGNOSIS — R2242 Localized swelling, mass and lump, left lower limb: Secondary | ICD-10-CM

## 2014-12-29 DIAGNOSIS — Z720 Tobacco use: Secondary | ICD-10-CM

## 2014-12-29 DIAGNOSIS — F17210 Nicotine dependence, cigarettes, uncomplicated: Secondary | ICD-10-CM | POA: Diagnosis not present

## 2014-12-29 LAB — CBC WITH DIFFERENTIAL/PLATELET
BASO%: 1.3 % (ref 0.0–2.0)
Basophils Absolute: 0.1 10*3/uL (ref 0.0–0.1)
EOS ABS: 0 10*3/uL (ref 0.0–0.5)
EOS%: 0.5 % (ref 0.0–7.0)
HEMATOCRIT: 37.2 % (ref 34.8–46.6)
HGB: 12.9 g/dL (ref 11.6–15.9)
LYMPH%: 33.4 % (ref 14.0–49.7)
MCH: 32.8 pg (ref 25.1–34.0)
MCHC: 34.7 g/dL (ref 31.5–36.0)
MCV: 94.3 fL (ref 79.5–101.0)
MONO#: 0.3 10*3/uL (ref 0.1–0.9)
MONO%: 5 % (ref 0.0–14.0)
NEUT#: 3.4 10*3/uL (ref 1.5–6.5)
NEUT%: 59.8 % (ref 38.4–76.8)
PLATELETS: 385 10*3/uL (ref 145–400)
RBC: 3.94 10*6/uL (ref 3.70–5.45)
RDW: 12.8 % (ref 11.2–14.5)
WBC: 5.7 10*3/uL (ref 3.9–10.3)
lymph#: 1.9 10*3/uL (ref 0.9–3.3)

## 2014-12-29 LAB — COMPREHENSIVE METABOLIC PANEL (CC13)
ALT: 9 U/L (ref 0–55)
ANION GAP: 11 meq/L (ref 3–11)
AST: 16 U/L (ref 5–34)
Albumin: 3.3 g/dL — ABNORMAL LOW (ref 3.5–5.0)
Alkaline Phosphatase: 82 U/L (ref 40–150)
BUN: 5.6 mg/dL — ABNORMAL LOW (ref 7.0–26.0)
CALCIUM: 10.2 mg/dL (ref 8.4–10.4)
CHLORIDE: 109 meq/L (ref 98–109)
CO2: 21 mEq/L — ABNORMAL LOW (ref 22–29)
Creatinine: 0.8 mg/dL (ref 0.6–1.1)
EGFR: >90 mL/min/{1.73_m2} (ref >90)
Glucose: 96 mg/dl (ref 70–140)
POTASSIUM: 3.1 meq/L — AB (ref 3.5–5.1)
Sodium: 141 mEq/L (ref 136–145)
Total Bilirubin: 0.49 mg/dL (ref 0.20–1.20)
Total Protein: 7.4 g/dL (ref 6.4–8.3)

## 2014-12-29 MED ORDER — ACETAMINOPHEN 650 MG RE SUPP: 650.0000 mg | Freq: Four times a day (QID) | RECTAL | Status: DC | PRN

## 2014-12-29 MED ORDER — ENOXAPARIN SODIUM 40 MG/0.4ML ~~LOC~~ SOLN
40.0000 mg | SUBCUTANEOUS | Status: DC
  Administered 2014-12-29 – 2015-01-03 (×6): 40 mg via SUBCUTANEOUS
  Filled 2014-12-29 (×6): qty 0.4

## 2014-12-29 MED ORDER — POLYETHYLENE GLYCOL 3350 17 G PO PACK: 17.0000 g | PACK | Freq: Every day | ORAL | Status: DC | PRN

## 2014-12-29 MED ORDER — ONDANSETRON HCL 4 MG/2ML IJ SOLN
4.0000 mg | Freq: Four times a day (QID) | INTRAMUSCULAR | Status: DC | PRN
  Administered 2014-12-29 – 2015-01-01 (×6): 4 mg via INTRAVENOUS
  Filled 2014-12-29 (×6): qty 2

## 2014-12-29 MED ORDER — HYDROMORPHONE HCL 2 MG/ML IJ SOLN
1.0000 mg | INTRAMUSCULAR | Status: DC | PRN
  Administered 2014-12-29 – 2015-01-01 (×5): 1 mg via INTRAVENOUS
  Filled 2014-12-29 (×5): qty 1

## 2014-12-29 MED ORDER — IOHEXOL 300 MG/ML  SOLN
50.0000 mL | Freq: Once | INTRAMUSCULAR | Status: AC | PRN
  Administered 2014-12-29: 50 mL via ORAL

## 2014-12-29 MED ORDER — IOHEXOL 300 MG/ML  SOLN
100.0000 mL | Freq: Once | INTRAMUSCULAR | Status: AC | PRN
  Administered 2014-12-29: 100 mL via INTRAVENOUS

## 2014-12-29 MED ORDER — NICOTINE 14 MG/24HR TD PT24
14.0000 mg | MEDICATED_PATCH | Freq: Every day | TRANSDERMAL | Status: DC
  Administered 2014-12-29 – 2015-01-04 (×7): 14 mg via TRANSDERMAL
  Filled 2014-12-29 (×7): qty 1

## 2014-12-29 MED ORDER — POTASSIUM CHLORIDE CRYS ER 20 MEQ PO TBCR
40.0000 meq | EXTENDED_RELEASE_TABLET | Freq: Once | ORAL | Status: AC
  Administered 2014-12-29: 40 meq via ORAL
  Filled 2014-12-29: qty 2

## 2014-12-29 MED ORDER — BISACODYL 10 MG RE SUPP
10.0000 mg | Freq: Every day | RECTAL | Status: DC | PRN
  Administered 2015-01-01: 10 mg via RECTAL
  Filled 2014-12-29: qty 1

## 2014-12-29 MED ORDER — OXYCODONE HCL ER 20 MG PO T12A
20.0000 mg | EXTENDED_RELEASE_TABLET | Freq: Two times a day (BID) | ORAL | Status: DC
  Administered 2014-12-29 – 2015-01-04 (×12): 20 mg via ORAL
  Filled 2014-12-29 (×12): qty 1

## 2014-12-29 MED ORDER — OXYCODONE HCL 5 MG PO TABS
5.0000 mg | ORAL_TABLET | ORAL | Status: DC | PRN
  Administered 2014-12-30: 10 mg via ORAL
  Administered 2014-12-31: 5 mg via ORAL
  Administered 2015-01-01 – 2015-01-02 (×4): 10 mg via ORAL
  Administered 2015-01-03: 5 mg via ORAL
  Administered 2015-01-04: 10 mg via ORAL
  Filled 2014-12-29: qty 2
  Filled 2014-12-29: qty 1
  Filled 2014-12-29 (×2): qty 2
  Filled 2014-12-29: qty 1
  Filled 2014-12-29: qty 2
  Filled 2014-12-29: qty 1
  Filled 2014-12-29 (×2): qty 2

## 2014-12-29 MED ORDER — ALUM & MAG HYDROXIDE-SIMETH 200-200-20 MG/5ML PO SUSP: 30.0000 mL | Freq: Four times a day (QID) | ORAL | Status: DC | PRN

## 2014-12-29 MED ORDER — SODIUM CHLORIDE 0.9 % IV SOLN
INTRAVENOUS | Status: DC
  Administered 2014-12-29: 19:00:00 via INTRAVENOUS

## 2014-12-29 MED ORDER — ENSURE ENLIVE PO LIQD
237.0000 mL | Freq: Three times a day (TID) | ORAL | Status: DC
  Administered 2014-12-29 – 2015-01-04 (×11): 237 mL via ORAL

## 2014-12-29 MED ORDER — GADOBENATE DIMEGLUMINE 529 MG/ML IV SOLN
10.0000 mL | Freq: Once | INTRAVENOUS | Status: AC | PRN
  Administered 2014-12-29: 10 mL via INTRAVENOUS

## 2014-12-29 MED ORDER — DOCUSATE SODIUM 100 MG PO CAPS
100.0000 mg | ORAL_CAPSULE | Freq: Two times a day (BID) | ORAL | Status: DC
  Administered 2014-12-29 – 2015-01-01 (×7): 100 mg via ORAL
  Filled 2014-12-29 (×7): qty 1

## 2014-12-29 MED ORDER — ACETAMINOPHEN 325 MG PO TABS: 650.0000 mg | ORAL_TABLET | Freq: Four times a day (QID) | ORAL | Status: DC | PRN

## 2014-12-29 NOTE — H&P (Signed)
[facility] History and Physical  Andrea [name] WPY:[id number] DOB: 1970/10/19 DOA: 12/29/2014  Referring physician: Dr Julien Nordmann PCP: [name], MD   Chief Complaint: hip pain.   HPI: Andrea [name] is a 44 y.o. female with PMH significant for lumbar laminectomy, depression , current smoker recently diagnosed with metastatic adenocarcinoma  lung cancer 12-21-2014, left ilium mass, who presents to Dr Julien Nordmann office to established care after new diagnosis of lung cancer. Patient was complaining of severe left hip pain, radiates to left calf and thigh. Pain medication has not been helping. She was not able to afford OxyContin. She relates a not at site of recent biopsy. Had mild bloody, serous drainage fluids from biopsy site. She has not been able to ambulate due to pain. She has been holding her BM, unable to seat on the toilet.  Patient denies chest pain, dyspnea. Does report poor appetite.   Dr Julien Nordmann refer patient for admission; for pain control, start inpatient radiation treatment, IV fluids, and to complete staging work up.     Review of Systems:  Negative except as per HPI  Past Medical History  Diagnosis Date  . Low back pain radiating to left leg 11/29/2010  . Dysmenorrhea 12/06/2009    Qualifier: Diagnosis of  By: [name] RN, Regino Schultze    . Chronic back pain   . Family history of adverse reaction to anesthesia     mother stopped breathing after surgery  . Shortness of breath dyspnea     with exertion  . Anxiety   . Depression   . Headache     stress  . Anginal pain     chest pain not sure what related to   Past Surgical History  Procedure Laterality Date  . Laproscopy surgery to check her tubes    . Lumbar laminectomy/decompression microdiscectomy Left 05/01/2014    Procedure: Microdiscectomy - L5-S1 - left;  Surgeon: Eustace Moore, MD;  Location: [place];  Service: Neurosurgery;  Laterality: Left;   Social History:  reports that she has been smoking Cigarettes and  E-cigarettes.  She has a 12.5 pack-year smoking history. She does not have any smokeless tobacco history on file. She reports that she does not drink alcohol or use illicit drugs.  Allergies  Allergen Reactions  . Morphine And Related Rash    Patient states that if she takes more than 30 days she gets rashes.  . Hydrocodone Rash    Family History  Problem Relation Age of Onset  . Diabetes Mother   . Hypertension Mother   . Hypertension Father   . Heart attack Maternal Grandfather   . Hypertension Maternal Grandfather   . Heart attack Paternal Grandfather   . Hypertension Paternal Grandfather     Prior to Admission medications   Medication Sig Start Date End Date Taking? Authorizing Provider  Multiple Vitamins-Minerals (MULTIVITAMIN & MINERAL PO) Take 1 tablet by mouth daily.    Historical Provider, MD  OxyCODONE (OXYCONTIN) 20 mg T12A 12 hr tablet Take 1 tablet (20 mg total) by mouth every 12 (twelve) hours. Patient not taking: Reported on 12/29/2014 12/17/14   [name], MD  oxyCODONE (ROXICODONE) 5 MG immediate release tablet Take 1-2 tablets (5-10 mg total) by mouth every 4 (four) hours as needed. 12/17/14 12/17/15  [name], MD  Oxycodone HCl 10 MG TABS Take 1 tablet (10 mg total) by mouth every 4 (four) hours as needed for severe pain. 12/23/14   [name], MD  Prenatal Vit-Fe  Fumarate-FA (PRENATAL VITAMIN PO) Take 1 tablet by mouth daily.    Historical Provider, MD   Physical Exam: Filed Vitals:   12/29/14 1720  BP: 98/61  Pulse: 80  Temp: 97.3 F (36.3 C)  TempSrc: Oral  Resp: 18  SpO2: 100%    Wt Readings from Last 3 Encounters:  12/29/14 54.931 kg (121 lb 1.6 oz)  12/21/14 58.968 kg (130 lb)  12/11/14 59.875 kg (132 lb)    General:  Appears calm and comfortable, cachetic Eyes: PERRL, normal lids, irises & conjunctiva ENT: grossly normal hearing, lips & tongue Neck: no LAD, masses or thyromegaly Cardiovascular: RRR, no m/r/g. No LE edema. Telemetry: SR,  no arrhythmias  Respiratory: CTA bilaterally, no w/r/r. Normal respiratory effort. Abdomen: soft, ntnd Skin: no rash or induration seen on limited exam Musculoskeletal: grossly normal tone BUE/BLE, left hip with small induration, discoloration of the skin Psychiatric: grossly normal mood and affect, speech fluent and appropriate Neurologic: grossly non-focal.          Labs on Admission:  Basic Metabolic Panel:  Recent Labs Lab 12/29/14 1529  NA 141  K 3.1*  CO2 21*  GLUCOSE 96  BUN 5.6*  CREATININE 0.8  CALCIUM 10.2   Liver Function Tests:  Recent Labs Lab 12/29/14 1529  AST 16  ALT 9  ALKPHOS 82  BILITOT 0.49  PROT 7.4  ALBUMIN 3.3*   No results for input(s): LIPASE, AMYLASE in the last 168 hours. No results for input(s): AMMONIA in the last 168 hours. CBC:  Recent Labs Lab 12/29/14 1529  WBC 5.7  NEUTROABS 3.4  HGB 12.9  HCT 37.2  MCV 94.3  PLT 385   Cardiac Enzymes: No results for input(s): CKTOTAL, CKMB, CKMBINDEX, TROPONINI in the last 168 hours.  BNP (last 3 results) No results for input(s): BNP in the last 8760 hours.  ProBNP (last 3 results) No results for input(s): PROBNP in the last 8760 hours.  CBG: No results for input(s): GLUCAP in the last 168 hours.  Radiological Exams on Admission: No results found.  EKG: none available  Assessment/Plan Active Problems:   Lung cancer   Uncontrolled pain   Mass of left hip region  1-Metastatic Non small cell lung cancer:   I have reviewed Dr Julien Nordmann note. I have order CT abdomen, pelvis chest with contrast for staging. Also MRI brain.  Dr Julien Nordmann will  follow up on patient while in the hospital .  2-Ilium Mass, bone lytic lesions. Uncontrol pain.  Dr Julien Nordmann will consult radiation oncologist/  I will start OxyContin, CM consulted for medications assistance.  IV dilaudid, PRN Zofran for nausea.  Will check X ray. Might need to inform IR regarding patient complaints about swelling area at  biopsy site.   3-Hypokalemia: replete with oral.   4-Malnutrition;  Nutrition consulted.   5-Dehydration; start IV fluids.   6-Constipation; docusate. Dulcolax suppository order PRN.   Code Status: full code.  DVT Prophylaxis: lovenox Family Communication: Care discussed with patient.  Disposition Plan: expect 3 to 4 days  Time spent: 75 minutes.   Niel Hummer A [facility] Pager [phone]

## 2014-12-29 NOTE — Progress Notes (Signed)
[place] [phone]   [phone]  CONSULT NOTE  REFERRING PHYSICIAN: Dr. Murriel Hopper  REASON FOR CONSULTATION:  44 years old African-American female with metastatic lung cancer.  HPI Andrea [name] is a 44 y.o. female with past medical history significant for dysmenorrhea, degenerative disc disease, depression as well as long history of smoking. The patient mentions that he she has been complaining of low back pain since June 2016. She had MRI of the lumbar spines on 10/03/2014 that showed abnormal enhancement of the left ilium. She was admitted to [facility] in late August 2016 and MRI of the pelvis on 12/14/2014 showed 2.7 x 2.9 x 4.0 cm expansile double bone lesion in the left posterior ilium with avid enhancement of postcontrast imaging concerning for malignancy. There was adjacent muscle edema in the left paraspinal muscle. On 01/21/2015 the patient underwent CT-guided core biopsy of the left ilium lesion by interventional radiology and the final pathology (Accession: [id number]) showed metastatic adenocarcinoma of lung primary.The adenocarcinoma demonstrates the following immunophenotype: Cytokeratin 7 - strong diffuse expression.Cytokeratin 20 - negative expressionTTF-1 - patchy strong expression, CDX2 - negative expression. Overall the morphology and immunophenotype are that of metastatic adenocarcinoma primary to primary lung. The patient was referred to me today for evaluation and recommendation regarding her condition. When seen today she was laying on the bed and unable to sit down. She has difficulty with bowel movement because of inability to sit on the toilet. She continues to have severe pain in the left hip area. The patient lost several pounds recently. She also complains of night sweats. She denied having any significant chest pain, shortness of breath but has mild cough with no hemoptysis. The patient denied having any  headache or visual changes. She denied having any significant fever or chills, no nausea or vomiting. She is currently on OxyContin 20 mg by mouth every 12 hours in addition to oxycodone for breakthrough pain. Family history significant for mother with diabetes mellitus and hypertension. She had an uncle with throat cancer and another uncle with lung cancer. The patient is single and has no children. She was accompanied today by her mother Andrea [name] and her father Andrea [name]. 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 She had a history of smoking for more than 20 years and unfortunately she continues to smoke a few cigarettes every day. She also has a history of alcohol abuse but no drug abuse. HPI  Past Medical History  Diagnosis Date  . Low back pain radiating to left leg 11/29/2010  . Dysmenorrhea 12/06/2009    Qualifier: Diagnosis of  By: [name] RN, Regino Schultze    . Chronic back pain   . Family history of adverse reaction to anesthesia     mother stopped breathing after surgery  . Shortness of breath dyspnea     with exertion  . Anxiety   . Depression   . Headache     stress  . Anginal pain     chest pain not sure what related to    Past Surgical History  Procedure Laterality Date  . Laproscopy surgery to check her tubes    . Lumbar laminectomy/decompression microdiscectomy Left 05/01/2014    Procedure: Microdiscectomy - L5-S1 - left;  Surgeon: Eustace Moore, MD;  Location: [place];  Service: Neurosurgery;  Laterality: Left;    Family History  Problem Relation Age of Onset  . Diabetes Mother   .  Hypertension Mother   . Hypertension Father   . Heart attack Maternal Grandfather   . Hypertension Maternal Grandfather   . Heart attack Paternal Grandfather   . Hypertension Paternal Grandfather     Social History Social History  Substance Use Topics  . Smoking status: Current Every Day Smoker -- 0.50 packs/day for 25 years    Types: Cigarettes,  E-cigarettes  . Smokeless tobacco: None     Comment: Smoking very little.  . Alcohol Use: No    Allergies  Allergen Reactions  . Morphine And Related Rash    Patient states that if she takes more than 30 days she gets rashes.  . Hydrocodone Rash    Current Outpatient Prescriptions  Medication Sig Dispense Refill  . Multiple Vitamins-Minerals (MULTIVITAMIN & MINERAL PO) Take 1 tablet by mouth daily.    [name] oxyCODONE (ROXICODONE) 5 MG immediate release tablet Take 1-2 tablets (5-10 mg total) by mouth every 4 (four) hours as needed. 240 tablet 0  . Oxycodone HCl 10 MG TABS Take 1 tablet (10 mg total) by mouth every 4 (four) hours as needed for severe pain. 30 tablet 0  . Prenatal Vit-Fe Fumarate-FA (PRENATAL VITAMIN PO) Take 1 tablet by mouth daily.    . OxyCODONE (OXYCONTIN) 20 mg T12A 12 hr tablet Take 1 tablet (20 mg total) by mouth every 12 (twelve) hours. (Patient not taking: Reported on 12/29/2014) 40 tablet 0   No current facility-administered medications for this visit.    Review of Systems  Constitutional: positive for anorexia, fatigue and weight loss Eyes: negative Ears, nose, mouth, throat, and face: negative Respiratory: positive for cough Cardiovascular: negative Gastrointestinal: negative Genitourinary:negative Integument/breast: negative Hematologic/lymphatic: negative Musculoskeletal:positive for back pain, bone pain and muscle weakness Neurological: negative Behavioral/Psych: negative Endocrine: negative Allergic/Immunologic: negative  Physical Exam  OAC:ZYSAY, healthy, no distress, well nourished, well developed, anxious and cachectic SKIN: skin color, texture, turgor are normal, no rashes or significant lesions HEAD: Normocephalic, No masses, lesions, tenderness or abnormalities EYES: normal, PERRLA, Conjunctiva are pink and non-injected EARS: External ears normal, Canals clear OROPHARYNX:no exudate, no erythema and lips, buccal mucosa, and tongue normal    NECK: supple, no adenopathy, no JVD LYMPH:  no palpable lymphadenopathy, no hepatosplenomegaly BREAST:not examined LUNGS: clear to auscultation , and palpation HEART: regular rate & rhythm, no murmurs and no gallops ABDOMEN:abdomen soft, non-tender, normal bowel sounds and no masses or organomegaly BACK: Severe pain in the lower back and left iliac area. EXTREMITIES:no joint deformities, effusion, or inflammation, no edema, no skin discoloration, no clubbing  NEURO: alert & oriented x 3 with fluent speech, no focal motor/sensory deficits  PERFORMANCE STATUS: ECOG 1  LABORATORY DATA: Lab Results  Component Value Date   WBC 5.7 12/29/2014   HGB 12.9 12/29/2014   HCT 37.2 12/29/2014   MCV 94.3 12/29/2014   PLT 385 12/29/2014      Chemistry      Component Value Date/Time   NA 137 01/08/2014 1637   K 4.0 01/08/2014 1637   CL 104 01/08/2014 1637   CO2 25 01/08/2014 1637   BUN 7 01/08/2014 1637   CREATININE 0.85 01/08/2014 1637   CREATININE 1.00 04/20/2013 1320      Component Value Date/Time   CALCIUM 9.9 01/08/2014 1637   ALKPHOS 74 04/20/2013 1320   AST 17 04/20/2013 1320   ALT 14 04/20/2013 1320   BILITOT 0.5 04/20/2013 1320       RADIOGRAPHIC STUDIES: Mr Pelvis W Wo Contrast  12/14/2014  CLINICAL DATA:  Previous back surgery.  Extreme pain.  EXAM: MRI PELVIS WITHOUT AND WITH CONTRAST  TECHNIQUE: Multiplanar multisequence MR imaging of the pelvis was performed both before and after administration of intravenous contrast.  CONTRAST:  64m MULTIHANCE GADOBENATE DIMEGLUMINE 529 MG/ML IV SOLN  COMPARISON:  None.  FINDINGS: Bone  There is 2.7 x 2.9 x 4 cm expansile bone lesion in the left posterior ilium with avid enhancement on post-contrast imaging. There is adjacent reactive marrow edema in the ilium. There is soft tissue edema in the adjacent left paraspinal muscle.  There is no hip fracture, dislocation or avascular necrosis.  There is severe degenerative disc disease with  disc height loss at L5-S1 with a broad-based disc bulge and bilateral facet arthropathy.  Alignment  Normal. No subluxation.  Dysplasia  None.  Joint effusion  None.  Labrum  Normal. No labral tear.  Cartilage  Femoral cartilage: Normal.  Acetabular cartilage: Normal.  Capsule and ligaments  Normal.  Muscles and Tendons  Flexors: Normal.  Extensors: Normal.  Abductors: Normal.  Adductors: Normal.  Rotators: Normal.  Hamstrings: Normal.  Other Findings  None  Viscera  Normal. No abnormality seen in pelvis. No lymphadenopathy. No free fluid in the pelvis.  IMPRESSION: 1. Mildly expansile 2.7 x 2.9 x 4 cm mass in the left posterior ilium with avid enhancement most concerning for malignancy with adjacent muscle edema in the left paraspinal muscle. This may represent metastatic disease versus multiple myeloma versus postsurgical changes or infection given that the patient has had prior intervention along the left side at L5-S1. Correlate with laboratory values.   Electronically Signed   By: HKathreen Devoid  On: 12/14/2014 11:23   [place]al  12/14/2014   CLINICAL DATA:  Renal lesions on MRI.  EXAM: RENAL / URINARY TRACT ULTRASOUND COMPLETE  COMPARISON:  None.  FINDINGS: Right Kidney:  Length: 11.0 cm. Echogenicity within normal limits. No hydronephrosis visualized. 1.2 cm simple cyst.  Left Kidney:  Length: 10.7 cm. Echogenicity within normal limits. No hydronephrosis visualized. 1.0 cm simple cyst .  Bladder:  Appears normal for degree of bladder distention.  IMPRESSION: Bilateral simple renal cysts attending for MRI abnormality, exam otherwise normal. No significant renal lesions identified.   Electronically Signed   By: TMarcello Moores Register   On: 12/14/2014 11:30   Ct Biopsy  12/21/2014   CLINICAL DATA:  Painful destructive lesion in the left iliac bone near the SI joint. Previous lumbar disc surgery.  EXAM: CT GUIDED CORE BIOPSY OF LEFT ILIUM LESION  ANESTHESIA/SEDATION: Intravenous Fentanyl and Versed were  administered as conscious sedation during continuous cardiorespiratory monitoring by the radiology RN, with a total moderate sedation time of 11 minutes.  PROCEDURE: The procedure risks, benefits, and alternatives were explained to the patient. Questions regarding the procedure were encouraged and answered. The patient understands and consents to the procedure.  Patient placed prone. Select axial scans through the pelvis obtained. The lytic left ilium lesion was localized and an appropriate skin entry site was determined and marked.  The operative field was prepped with chlorhexidinein a sterile fashion, and a sterile drape was applied covering the operative field. A sterile gown and sterile gloves were used for the procedure. Local anesthesia was provided with 1% Lidocaine.  Under CT fluoroscopic guidance, a 17 gauge trocar needle was advanced to the margin of the lesion. Once needle tip position was confirmed, coaxial 18-gauge core biopsy samples were obtained, submitted in formalin to surgical pathology, and in  saline to microbiology. The guide needle was removed. Postprocedure scans show no hematoma or other apparent complication. The patient tolerated the procedure well.  COMPLICATIONS: None immediate  FINDINGS: Lytic lesion in the left ilium at the posterior margin of the SI joint, with cortical destruction. Core biopsy samples were obtained, sent for surgical path and microbiology results.  IMPRESSION: 1. Technically successful CT-guided core biopsy of left ilium bone lesion.   Electronically Signed   By: Lucrezia Europe M.D.   On: 12/21/2014 11:09    ASSESSMENT: This is a very pleasant 44 years old African-American female who was recently diagnosed with metastatic non-small cell lung cancer, adenocarcinoma based on biopsy of left iliac bone lesion and the primary malignancy was consistent with lung cancer, adenocarcinoma. The patient has no imaging studies of the chest to identify the primary source of her  malignancy. She has severe pain in the pelvic area to the point that she cannot sit down for bowel movement. She has a lot of logistic problem for transportation and his staging workup   PLAN: I had a lengthy discussion with the patient and her family today about her current condition. The patient will need complete the staging workup by at least ordering CT scan of the chest, abdomen and pelvis as well as MRI of the brain. I will send a blood sample today for EGFR mutation status. Her pain is severe and she is not responding to current medication. I strongly recommended for the patient to be admitted to [facility] for pain management and also to complete the staging workup and start palliative radiotherapy to the painful bony lesion. I will call him with the hospitalist service today to admit the patient to the hospital for her workup and management of her pain. I will continue to follow the patient with them and give recommendation as needed. The patient and her family agreed to the current plan. The patient voices understanding of current disease status and treatment options and is in agreement with the current care plan.  All questions were answered. The patient knows to call the clinic with any problems, questions or concerns. We can certainly see the patient much sooner if necessary.  Thank you so much for allowing me to participate in the care of [employer]. I will continue to follow up the patient with you and assist in her care.  I spent 40 minutes counseling the patient face to face. The total time spent in the appointment was 60 minutes.  Disclaimer: This note was dictated with voice recognition software. Similar sounding words can inadvertently be transcribed and may not be corrected upon review.   Keiana Tavella K. December 29, 2014, 4:19 PM

## 2014-12-29 NOTE — Progress Notes (Signed)
Patient ID: Andrea [name], female   DOB: 09-11-1970, 44 y.o.   MRN: [id number]   Hx of metastatic adenocarcinoma , Dr Inda Merlin  requested admission for uncontrolled pelvic pain, inability to walk, patient needs further workup including CT scan of the chest abdomen pelvis, MRI of the brain to complete staging workup Admission requested to [facility] for pain management and also to complete the staging workup and start palliative radiotherapy to the painful bony lesion. Dr. Julien Nordmann will request radiation oncology consultation Admit patient to telemetry

## 2014-12-30 ENCOUNTER — Ambulatory Visit: Payer: No Typology Code available for payment source | Attending: Radiation Oncology | Admitting: Radiation Oncology

## 2014-12-30 ENCOUNTER — Encounter: Payer: Self-pay | Admitting: *Deleted

## 2014-12-30 ENCOUNTER — Telehealth: Payer: Self-pay | Admitting: *Deleted

## 2014-12-30 ENCOUNTER — Telehealth: Payer: Self-pay | Admitting: Internal Medicine

## 2014-12-30 DIAGNOSIS — R52 Pain, unspecified: Secondary | ICD-10-CM

## 2014-12-30 DIAGNOSIS — E876 Hypokalemia: Secondary | ICD-10-CM

## 2014-12-30 DIAGNOSIS — C3490 Malignant neoplasm of unspecified part of unspecified bronchus or lung: Secondary | ICD-10-CM

## 2014-12-30 DIAGNOSIS — G893 Neoplasm related pain (acute) (chronic): Secondary | ICD-10-CM

## 2014-12-30 DIAGNOSIS — C3400 Malignant neoplasm of unspecified main bronchus: Secondary | ICD-10-CM

## 2014-12-30 DIAGNOSIS — C7951 Secondary malignant neoplasm of bone: Principal | ICD-10-CM

## 2014-12-30 DIAGNOSIS — R2242 Localized swelling, mass and lump, left lower limb: Secondary | ICD-10-CM

## 2014-12-30 LAB — CBC
HCT: 32.7 % — ABNORMAL LOW (ref 36.0–46.0)
Hemoglobin: 11.2 g/dL — ABNORMAL LOW (ref 12.0–15.0)
MCH: 32.6 pg (ref 26.0–34.0)
MCHC: 34.3 g/dL (ref 30.0–36.0)
MCV: 95.1 fL (ref 78.0–100.0)
PLATELETS: 355 10*3/uL (ref 150–400)
RBC: 3.44 MIL/uL — AB (ref 3.87–5.11)
RDW: 12.8 % (ref 11.5–15.5)
WBC: 6.3 10*3/uL (ref 4.0–10.5)

## 2014-12-30 LAB — BASIC METABOLIC PANEL
Anion gap: 7 (ref 5–15)
BUN: 6 mg/dL (ref 6–20)
CALCIUM: 9.2 mg/dL (ref 8.9–10.3)
CO2: 23 mmol/L (ref 22–32)
CREATININE: 0.64 mg/dL (ref 0.44–1.00)
Chloride: 106 mmol/L (ref 101–111)
GFR calc non Af Amer: >60 mL/min (ref >60)
Glucose, Bld: 93 mg/dL (ref 65–99)
Potassium: 3.4 mmol/L — ABNORMAL LOW (ref 3.5–5.1)
SODIUM: 136 mmol/L (ref 135–145)

## 2014-12-30 MED ORDER — POTASSIUM CHLORIDE CRYS ER 20 MEQ PO TBCR
40.0000 meq | EXTENDED_RELEASE_TABLET | Freq: Once | ORAL | Status: AC
  Administered 2014-12-30: 40 meq via ORAL
  Filled 2014-12-30: qty 2

## 2014-12-30 NOTE — Progress Notes (Signed)
Initial Nutrition Assessment  DOCUMENTATION CODES:   Non-severe (moderate) malnutrition in context of chronic illness  INTERVENTION:   -Continue Ensure Enlive po TID, each supplement provides 350 kcal and 20 grams of protein -Provided "High Calorie, High Protein" handout from [employer] -Encouraged PO intake -RD to continue to monitor   NUTRITION DIAGNOSIS:   Malnutrition related to chronic illness as evidenced by percent weight loss, moderate depletions of muscle mass.  GOAL:   Patient will meet greater than or equal to 90% of their needs  MONITOR:   PO intake, Supplement acceptance, Labs, Weight trends, Skin, I & O's  REASON FOR ASSESSMENT:   Consult Assessment of nutrition requirement/status  ASSESSMENT:   44 y.o. female with PMH significant for lumbar laminectomy, depression , current smoker recently diagnosed with metastatic adenocarcinoma lung cancer 12-21-2014, left ilium mass, who presents to Dr Julien Nordmann office to established care after new diagnosis of lung cancer. Patient was complaining of severe left hip pain, radiates to left calf and thigh.   Pt reports ordered breakfast tray but had to order again at a later time. Pt frustrated that tray took so long to get to her room. Breakfast arrived during RD visit.  Pt reports improved appetite. Pt states she does not eat much at one time. Pt states she is currently homeless, staying with friends or family. Pt reports having access to food despite living conditions. Pt interested in receiving Ensure supplements as she wants to gain weight. RD encouraged pt to consume small frequent meals and supplements if available. Pt requesting information about high calorie, high protein diet. RD to provide handout.  Per weight history, pt has lost 13 lb since 8/08 (10% weight loss x 1 month, significant for time frame).  Nutrition-Focused physical exam completed. Findings are no fat depletion, moderate muscle  depletion, and no edema.  Labs reviewed: Low K  Diet Order:  Diet regular Room service appropriate?: Yes; Fluid consistency:: Thin  Skin:  Reviewed, no issues  Last BM:  8/31  Height:   Ht Readings from Last 1 Encounters:  12/29/14 '5\' 6"'$  (1.676 m)    Weight:   Wt Readings from Last 1 Encounters:  12/29/14 121 lb 1.6 oz (54.931 kg)    Ideal Body Weight:  59.1 kg  BMI:  19.5  Estimated Nutritional Needs:   Kcal:  1650-1850  Protein:  80-90g  Fluid:  1.8L/day  EDUCATION NEEDS:   Education needs addressed  Clayton Bibles, MS, RD, LDN Pager: [phone] After Hours Pager: [phone]

## 2014-12-30 NOTE — Consult Note (Signed)
[facility] NEW PATIENT EVALUATION  Name: Andrea [name] MRN: [id number]  Date:   12/29/2014           DOB: August 18, 1970  Status: inpatient   CC: [name], MD  Dr. Curt Bears   REFERRING PHYSICIAN: Dr. Curt Bears  DIAGNOSIS: Adenocarcinoma of the lung, metastatic to bone, left iliac bone   HISTORY OF PRESENT ILLNESS:  Andrea [name] is a 44 y.o. female who is seen today through the courtesy of Dr. Julien Nordmann for consideration of pale to radiotherapy in the management of her metastatic carcinoma the lung to bone.  She tells me she has had radiating left buttock pain since 2012.  She described pain radiating down to the distal left lower extremity.  A lumbar MRI scan on 12/03/2010 showed a L5-S1 disc degeneration with disc protrusion and annular tear at the descending left S1 nerve root.  She was seen by Dr. Ronnald Ramp of neurosurgery who performed a L5-S1 laminectomy in January 2016.  She states that her pain did not improve.  A follow-up MRI scan of the lumbar spine on 11/02/2014 showed postop granulation tissue surrounding the left S1 and left S2 nerve roots.  There was a bulge in spur extending into the inferior aspect of the neural foramen crowding but not compressing the course of the exiting L5 nerve root.  There was also abnormal enhancement of the left ilium and a follow-up MRI scan was recommended.  This was performed on 12/14/2014.  She was found to have a 2.7 x 2.9 x 4.0 cm mass in the left posterior ilium concerning for malignancy.  A CT of the chest on 12/29/2014 showed right hilar and subcarinal adenopathy.  MR of the brain on 12/29/2014 was without evidence for metastatic disease.  On 12/21/2014 she underwent a CT-guided biopsy of the left ilium with the diagnosis of metastatic adenocarcinoma favoring a lung primary.  The patient has had worsening pain such that she cannot sit down on the toilet..  She has not been able to ambulate without using  crutches.  She  has bladder and bowel control.  She does have weakness of her left lower extremity but denies any change in sensation other than pain.  She was admitted to the hospital for pain control and start of radiation therapy.  She is started on OxyContin along with IV hydromorphone.  She seen today with her mother.  PREVIOUS RADIATION THERAPY: No   PAST MEDICAL HISTORY:  has a past medical history of Low back pain radiating to left leg (11/29/2010); Dysmenorrhea (12/06/2009); Chronic back pain; Family history of adverse reaction to anesthesia; Shortness of breath dyspnea; Anxiety; Depression; Headache; and Anginal pain.     PAST SURGICAL HISTORY:  Past Surgical History  Procedure Laterality Date  . Laproscopy surgery to check her tubes    . Lumbar laminectomy/decompression microdiscectomy Left 05/01/2014    Procedure: Microdiscectomy - L5-S1 - left;  Surgeon: Eustace Moore, MD;  Location: [facility];  Service: Neurosurgery;  Laterality: Left;     FAMILY HISTORY: family history includes Diabetes in her mother; Heart attack in her maternal grandfather and paternal grandfather; Hypertension in her father, maternal grandfather, mother, and paternal grandfather.  Her mother is alive and well except for diabetes mellitus at age 16.  Her father is alive and well at 63.   SOCIAL HISTORY:  reports that she has been smoking Cigarettes and E-cigarettes.  She has a 12.5 pack-year smoking history. She does not  have any smokeless tobacco history on file. She reports that she does not drink alcohol or use illicit drugs.  Single, never married, no children.  She performed clerical work in the past.  She is living with friends and family.  She is reapplying for disability.   ALLERGIES: Morphine and related and Hydrocodone   MEDICATIONS:  Current Facility-Administered Medications  Medication Dose Route Frequency Provider Last Rate Last Dose  . acetaminophen (TYLENOL) tablet 650 mg  650 mg Oral Q6H PRN  Belkys A Regalado, MD       Or  . acetaminophen (TYLENOL) suppository 650 mg  650 mg Rectal Q6H PRN Belkys A Regalado, MD      . alum & mag hydroxide-simeth (MAALOX/MYLANTA) 200-200-20 MG/5ML suspension 30 mL  30 mL Oral Q6H PRN Belkys A Regalado, MD      . bisacodyl (DULCOLAX) suppository 10 mg  10 mg Rectal Daily PRN Belkys A Regalado, MD      . docusate sodium (COLACE) capsule 100 mg  100 mg Oral BID Belkys A Regalado, MD   100 mg at 12/30/14 1015  . enoxaparin (LOVENOX) injection 40 mg  40 mg Subcutaneous Q24H Belkys A Regalado, MD   40 mg at 12/29/14 2311  . feeding supplement (ENSURE ENLIVE) (ENSURE ENLIVE) liquid 237 mL  237 mL Oral TID BM Clayton Bibles, RD   237 mL at 12/30/14 1000  . HYDROmorphone (DILAUDID) injection 1 mg  1 mg Intravenous Q3H PRN Belkys A Regalado, MD   1 mg at 12/30/14 0602  . nicotine (NICODERM CQ - dosed in mg/24 hours) patch 14 mg  14 mg Transdermal Daily Belkys A Regalado, MD   14 mg at 12/30/14 1013  . ondansetron (ZOFRAN) injection 4 mg  4 mg Intravenous Q6H PRN Belkys A Regalado, MD   4 mg at 12/30/14 0607  . oxyCODONE (Oxy IR/ROXICODONE) immediate release tablet 5-10 mg  5-10 mg Oral Q4H PRN Belkys A Regalado, MD   10 mg at 12/30/14 1606  . OxyCODONE (OXYCONTIN) 12 hr tablet 20 mg  20 mg Oral Q12H Belkys A Regalado, MD   20 mg at 12/30/14 1015  . polyethylene glycol (MIRALAX / GLYCOLAX) packet 17 g  17 g Oral Daily PRN Belkys A Regalado, MD         REVIEW OF SYSTEMS:  Pertinent items are noted in HPI.    PHYSICAL EXAM:  oral temperature is 98 F (36.7 C). Her blood pressure is 100/60 and her pulse is 77. Her respiration is 18 and oxygen saturation is 100%.   Alert and oriented 44 year old after American female appearing her stated age.  Head and neck examination: Grossly unremarkable.  Nodes: Without palpable cervical or supraclavicular lymphadenopathy.  Chest: Lungs clear.  Back: Along the left lower back and left buttock are pigmented skin changes from use  of a heating pad.  There is marked tenderness and swelling along the left iliac bone adjacent to her SI joint.  Extremities: There is atrophy of her proximal and distal left lower extremity.  Neurologic examination: There is weakness of both proximal and distal left lower extremity musculature (distal greater than proximal).  Sensation is remarkably normal to light touch.   LABORATORY DATA:  Lab Results  Component Value Date   WBC 6.3 12/30/2014   HGB 11.2* 12/30/2014   HCT 32.7* 12/30/2014   MCV 95.1 12/30/2014   PLT 355 12/30/2014   Lab Results  Component Value Date   NA 136 12/30/2014   K  3.4* 12/30/2014   CL 106 12/30/2014   CO2 23 12/30/2014   Lab Results  Component Value Date   ALT 9 12/29/2014   AST 16 12/29/2014   ALKPHOS 82 12/29/2014   BILITOT 0.49 12/29/2014      IMPRESSION: Metastatic adenocarcinoma of the lung to the left iliac bone.  I feel she would benefit from urgent radiation therapy.  She will visit [place] tomorrow for CT simulation, and begin treatment by the weekend.  I feel that she should receive approximately 3 weeks of fractionated radiation therapy for durable palliation.  She will obviously move on to systemic therapy under the direction of Dr. Julien Nordmann.  I discussed the potential acute and late toxicities of radiation therapy which should be reasonably well tolerated.  I discussed her treatment with her mother as well and they wish to proceed as outlined.  From a technical standpoint, she cannot lie flat on her back without significant pain, so we will try to treat her prone.   PLAN: As discussed above.  I spent 40 minutes face to face with the patient and more than 50% of that time was spent in counseling and/or coordination of care.

## 2014-12-30 NOTE — Care Management Note (Signed)
Case Management Note  Patient Details  Name: Andrea [name] MRN: [id number] Date of Birth: 04-29-1970  Subjective/Objective:44 y/o f admitted w/New dx Metastatic Lung Ca, pain control. From home.No insurance-medicaid pending-financial counselor following.Dr. Mohamed-Oncologist. Referral for meds asst-[facility]-[phone],left message w/triage nurse-attending concerned about narcotic resource since patient unable to afford med @  d/c.Explained that CM does not provide med assist for narcotics, Cancer Center may have resources for pain meds.Await call back.                    Action/Plan:d/c plan home.   Expected Discharge Date:   (unknown)               Expected Discharge Plan:  Home/Self Care  In-House Referral:     Discharge planning Services  CM Consult, Medication Assistance  Post Acute Care Choice:    Choice offered to:     DME Arranged:    DME Agency:     HH Arranged:    HH Agency:     Status of Service:  In process, will continue to follow  Medicare Important Message Given:    Date Medicare IM Given:    Medicare IM give by:    Date Additional Medicare IM Given:    Additional Medicare Important Message give by:     If discussed at [place] of Stay Meetings, dates discussed:    Additional Comments:  [name], RN 12/30/2014, 12:00 PM

## 2014-12-30 NOTE — Telephone Encounter (Signed)
Voicemail from case manager Juliann Pulse requesting "assistance on behalf of admitting Dr. Reesa Chew.  Patient in room 1335, for discharge, needs help with pain medicines.  Allergic to morphine."  Return number for [facility] [phone].

## 2014-12-30 NOTE — Progress Notes (Signed)
Oncology Nurse Navigator Documentation  Oncology Nurse Navigator Flowsheets 12/30/2014  Navigator Encounter Type Other/spoke with patient today in hospital.  She is in need of progress not to be sent to [facility] office.  I have to ask HIM dept on how to precede.  I called [facility] and left a vm message.    Patient Visit Type Initial;Inpatient  Treatment Phase Treatment  Interventions Coordination of Care  Coordination of Care Other  Time Spent with Patient 30

## 2014-12-30 NOTE — Progress Notes (Signed)
Subjective/Objective No subjective & objective note has been filed under this hospital service for this encounter.   Scheduled Meds: . docusate sodium  100 mg Oral BID  . enoxaparin (LOVENOX) injection  40 mg Subcutaneous Q24H  . feeding supplement (ENSURE ENLIVE)  237 mL Oral TID BM  . nicotine  14 mg Transdermal Daily  . OxyCODONE  20 mg Oral Q12H   Continuous Infusions:  PRN Meds:acetaminophen **OR** acetaminophen, alum & mag hydroxide-simeth, bisacodyl, HYDROmorphone, ondansetron (ZOFRAN) IV, oxyCODONE, polyethylene glycol  Vital signs in last 24 hours: Temp:  [98 F (36.7 C)] 98 F (36.7 C) (09/07 1430) Pulse Rate:  [77] 77 (09/07 1430) Resp:  [18] 18 (09/07 1430) BP: (100)/(60) 100/60 mmHg (09/07 1430) SpO2:  [100 %] 100 % (09/07 1430) Weight:  [121 lb (54.885 kg)] 121 lb (54.885 kg) (09/06 2103)  Intake/Output last 3 shifts:   Intake/Output this shift: Total I/O In: -  Out: 500 [Urine:500]  Problem Assessment/Plan No new assessment & plan notes have been filed under this hospital service since the last note was generated. Service: Radiation Oncology

## 2014-12-30 NOTE — Telephone Encounter (Signed)
Call to [place], [facility] regarding any assistance for pt. Pt currently admitted.

## 2014-12-30 NOTE — Progress Notes (Signed)
Subjective: The patient is seen and examined today. Her mother was at the bedside. She is feeling a little bit better today but continues to have severe pain in the left hip area. She denied having any significant fever or chills. She has no nausea or vomiting. The patient denied having any significant chest pain, shortness breath, cough or hemoptysis. She had CT scan of the chest, abdomen and pelvis as well as MRI of the brain performed last night.  Objective: Vital signs in last 24 hours: Temp:  [97.3 F (36.3 C)-98 F (36.7 C)] 97.3 F (36.3 C) (09/06 1720) Pulse Rate:  [80-100] 80 (09/06 1720) Resp:  [18-19] 18 (09/06 1720) BP: (98-99)/(61) 98/61 mmHg (09/06 1720) SpO2:  [100 %] 100 % (09/06 1720) Weight:  [121 lb (54.885 kg)-121 lb 1.6 oz (54.931 kg)] 121 lb (54.885 kg) (09/06 2103)  Intake/Output from previous day:   Intake/Output this shift: Total I/O In: -  Out: 500 [Urine:500]  General appearance: alert, cooperative, fatigued and no distress Resp: clear to auscultation bilaterally Cardio: regular rate and rhythm, S1, S2 normal, no murmur, click, rub or gallop GI: soft, non-tender; bowel sounds normal; no masses,  no organomegaly Extremities: extremities normal, atraumatic, no cyanosis or edema  Lab Results:   Recent Labs  12/29/14 1529 12/30/14 0405  WBC 5.7 6.3  HGB 12.9 11.2*  HCT 37.2 32.7*  PLT 385 355   BMET  Recent Labs  12/29/14 1529 12/30/14 0405  NA 141 136  K 3.1* 3.4*  CL  --  106  CO2 21* 23  GLUCOSE 96 93  BUN 5.6* 6  CREATININE 0.8 0.64  CALCIUM 10.2 9.2    Studies/Results: Ct Chest W Contrast  12/29/2014   CLINICAL DATA:  44 year old female with recent diagnosis of metastatic adenocarcinoma of the lung. Left iliac mass all. Patient presenting with severe left hip pain radiating to the left calf and thigh.  EXAM: CT CHEST, ABDOMEN, AND PELVIS WITH CONTRAST  TECHNIQUE: Multidetector CT imaging of the chest, abdomen and pelvis was performed  following the standard protocol during bolus administration of intravenous contrast.  CONTRAST:  11m OMNIPAQUE IOHEXOL 300 MG/ML SOLN, 1063mOMNIPAQUE IOHEXOL 300 MG/ML SOLN  COMPARISON:  CT dated 12/21/2014 and MRI dated 12/14/2014.  FINDINGS: CT CHEST FINDINGS  Mild centrilobular emphysema. There is no focal consolidation, pleural effusion, or pneumothorax. Low attenuating nodular soft tissue densities in the right hilar region and surrounding the right lower lobe pulmonary artery most compatible with enlarged lymph nodes. The largest measures up to 1.5 cm in diameter. A 1.7 x 1.1 cm subcarinal adenopathy noted. low attenuation within these lymph nodes likely represent necrotic changes.The central airways are patent.  The thoracic aorta and pulmonary arteries are unremarkable. No cardiomegaly or pericardial effusion. The thyroid gland is unremarkable. The esophagus is collapsed.  There is no axillary adenopathy. The chest wall and soft tissues appear unremarkable. The osseous structures of the thorax are intact. No acute fracture or osseous lesion is identified.  CT ABDOMEN AND PELVIS FINDINGS  No intra-abdominal free air.  Trace free fluid within the pelvis.  Subcentimeter right hepatic hypodense lesion is too small to characterize. The liver is otherwise unremarkable. The gallbladder, the pancreas, spleen, adrenal glands appear unremarkable. A 1 cm right renal hypodense lesion likely represents a cyst. Subcentimeter left renal hypodense lesions are too small to characterize but likely represent cysts. Ultrasound is recommended for further characterization is of the kidneys. There is no hydronephrosis on either side. The  visualized ureters and urinary bladder appear unremarkable. The uterus and ovaries are grossly unremarkable.  There is no evidence of bowel obstruction or inflammation. Normal appendix. The abdominal aorta and IVC appear unremarkable. The origins of the celiac axis, SMA, IMA as well as the  origins of the renal arteries are patent. No portal venous gas identified. Left iliac chain adenopathy noted.  A 3.8 x 3.8 cm soft tissue mass is noted in the left posterior iliac bone which appears slightly the gait compared to the prior study. There is apparent extension of the lesion into the left SI joint. There is associated destruction of the bone. Wake it is a there is degenerative changes at L5-S1.  IMPRESSION: Right hilar and subcarinal adenopathy.  Left posterior iliac bone known soft tissue mass most compatible with malignancy. This lesion appears slightly larger compared to the prior study and extends into the left SI joint.  Left iliac chain adenopathy.   Electronically Signed   By: Anner Crete M.D.   On: 12/29/2014 23:34   Mr Jeri Cos MP Contrast  12/30/2014   CLINICAL DATA:  Initial evaluation for possible metastatic disease. Recently diagnosed lung cancer  EXAM: MRI HEAD WITHOUT AND WITH CONTRAST  TECHNIQUE: Multiplanar, multiecho pulse sequences of the brain and surrounding structures were obtained without and with intravenous contrast.  CONTRAST:  32m MULTIHANCE GADOBENATE DIMEGLUMINE 529 MG/ML IV SOLN  COMPARISON:  None.  FINDINGS: The CSF containing spaces are within normal limits for patient age. No focal parenchymal signal abnormality is identified. No mass lesion, midline shift, or extra-axial fluid collection. Ventricles are normal in size without evidence of hydrocephalus.  No diffusion-weighted signal abnormality is identified to suggest acute intracranial infarct. Gray-white matter differentiation is maintained. Normal flow voids are seen within the intracranial vasculature. No intracranial hemorrhage identified.  The cervicomedullary junction is normal. Pituitary gland is within normal limits. Pituitary stalk is midline. The globes and optic nerves demonstrate a normal appearance with normal signal intensity. 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  No abnormal enhancement within the brain itself. Tiny focus of nodular enhancement within the left frontal lobe favored to be vascular nature (series 13, image 43).  The bone marrow signal intensity is normal. Calvarium is intact. Visualized upper cervical spine is within normal limits.  Scalp soft tissues are unremarkable.  Mild scattered opacity within the frontal sinuses and anterior ethmoidal air cells. Paranasal sinuses are otherwise clear. No mastoid effusion.  IMPRESSION: Normal brain MRI with no acute intracranial process identified. No evidence for intracranial metastasis.   Electronically Signed   By: BJeannine BogaM.D.   On: 12/30/2014 01:44   Ct Abdomen Pelvis W Contrast  12/29/2014   CLINICAL DATA:  44year old female with recent diagnosis of metastatic adenocarcinoma of the lung. Left iliac mass all. Patient presenting with severe left hip pain radiating to the left calf and thigh.  EXAM: CT CHEST, ABDOMEN, AND PELVIS WITH CONTRAST  TECHNIQUE: Multidetector CT imaging of the chest, abdomen and pelvis was performed following the standard protocol during bolus administration of intravenous contrast.  CONTRAST:  570mOMNIPAQUE IOHEXOL 300 MG/ML SOLN, 10049mMNIPAQUE IOHEXOL 300 MG/ML SOLN  COMPARISON:  CT dated 12/21/2014 and MRI dated 12/14/2014.  FINDINGS: CT CHEST FINDINGS  Mild centrilobular emphysema. There is no focal consolidation, pleural effusion, or pneumothorax. Low attenuating nodular soft tissue densities in the right hilar region and surrounding the right lower lobe pulmonary artery most compatible with enlarged lymph nodes.  The largest measures up to 1.5 cm in diameter. A 1.7 x 1.1 cm subcarinal adenopathy noted. low attenuation within these lymph nodes likely represent necrotic changes.The central airways are patent.  The thoracic aorta and pulmonary arteries are unremarkable. No cardiomegaly or pericardial effusion. The thyroid gland is unremarkable. The esophagus is collapsed.   There is no axillary adenopathy. The chest wall and soft tissues appear unremarkable. The osseous structures of the thorax are intact. No acute fracture or osseous lesion is identified.  CT ABDOMEN AND PELVIS FINDINGS  No intra-abdominal free air.  Trace free fluid within the pelvis.  Subcentimeter right hepatic hypodense lesion is too small to characterize. The liver is otherwise unremarkable. The gallbladder, the pancreas, spleen, adrenal glands appear unremarkable. A 1 cm right renal hypodense lesion likely represents a cyst. Subcentimeter left renal hypodense lesions are too small to characterize but likely represent cysts. Ultrasound is recommended for further characterization is of the kidneys. There is no hydronephrosis on either side. The visualized ureters and urinary bladder appear unremarkable. The uterus and ovaries are grossly unremarkable.  There is no evidence of bowel obstruction or inflammation. Normal appendix. The abdominal aorta and IVC appear unremarkable. The origins of the celiac axis, SMA, IMA as well as the origins of the renal arteries are patent. No portal venous gas identified. Left iliac chain adenopathy noted.  A 3.8 x 3.8 cm soft tissue mass is noted in the left posterior iliac bone which appears slightly the gait compared to the prior study. There is apparent extension of the lesion into the left SI joint. There is associated destruction of the bone. Wake it is a there is degenerative changes at L5-S1.  IMPRESSION: Right hilar and subcarinal adenopathy.  Left posterior iliac bone known soft tissue mass most compatible with malignancy. This lesion appears slightly larger compared to the prior study and extends into the left SI joint.  Left iliac chain adenopathy.   Electronically Signed   By: Anner Crete M.D.   On: 12/29/2014 23:34   Dg Hip Unilat With Pelvis 2-3 Views Left  12/29/2014   CLINICAL DATA:  Uncontrollable left hip pain. History of back pain radiating to the left  leg. Recent diagnosis of metastatic lung cancer and left ilium mass.  EXAM: DG HIP (WITH OR WITHOUT PELVIS) 2-3V LEFT  COMPARISON:  CT abdomen and pelvis 12/29/2014  FINDINGS: Examination is technically limited due to patient rotation hand due to contrast material in overlying bowel hand bladder which limits evaluation of the pelvis. There is no evidence of acute fracture or dislocation of the left hip. Mild degenerative changes are present. The known bone lesion in the left ilium is not well delineated. Visualize soft tissues are unremarkable.  IMPRESSION: Limited study due to residual contrast material obscuring visualization. No evidence of acute fracture or dislocation in the left hip.   Electronically Signed   By: Lucienne Capers M.D.   On: 12/29/2014 23:08    Medications: I have reviewed the patient's current medications.  CODE STATUS: Full code  Assessment/Plan: This is a very pleasant 44 years old African-American female recently diagnosed with: 1) Stage IV (T1a, N2, M1b) non-small cell lung cancer, adenocarcinoma presented with right hilar mass in addition to subcarinal lymphadenopathy and large metastatic left posterior iliac bone lesion. The final pathology was consistent with adenocarcinoma of lung primary. I sent a blood test for EGFR mutation status and this usually takes between 7-10 days for the results to become available. If negative, the  patient will be considered for systemic chemotherapy with carboplatin and Alimta on outpatient basis after discharge. I discussed the CT scan of the chest abdomen and pelvis as well as the MRI of the brain results with the patient and her mother. 2) severe pain in the left hip area secondary to metastatic bone disease: The patient is currently on OxyContin 20 mg by mouth every 12 hours in addition to Dilaudid for breakthrough pain. She is feeling a little bit better. We will continue with the current medication. If the patient cannot afford OxyContin  on outpatient basis she may be considered for fentanyl patch or tramadol as alternatives. The patient will need palliative radiotherapy to the left hip area. I consulted radiation oncology for evaluation and treatment of her bone disease. 3) GI and DVT prophylaxis: per the hospitalist service. 4) disposition: The patient has difficulty with ambulation and transportation. She may need to stay here for few days until she received a few fractions of radiotherapy to control her pain. Thank you so much for taking good care of Ms. Sauseda, I will continue to follow up the patient with you and assist in her management an as-needed basis.   LOS: 1 day    Gedalya Jim K. 12/30/2014

## 2014-12-30 NOTE — Care Management Note (Signed)
Case Management Note  Patient Details  Name: Andrea [name] MRN: [id number] Date of Birth: [birth year]  Subjective/Objective:  Received call back from Dr. Worthy Flank nurse Mary-recommend dilaudid or fentanyl patch, anticipate patient will received the start of radiation in the hospital. She will contact their community social worker Lauren about resource for narcotic to call me.Await   Response.Dr. Wynelle Cleveland updated.                Action/Plan:d/c plan home.   Expected Discharge Date:   (unknown)               Expected Discharge Plan:  Home/Self Care  In-House Referral:     Discharge planning Services  CM Consult, Medication Assistance  Post Acute Care Choice:    Choice offered to:     DME Arranged:    DME Agency:     HH Arranged:    HH Agency:     Status of Service:  In process, will continue to follow  Medicare Important Message Given:    Date Medicare IM Given:    Medicare IM give by:    Date Additional Medicare IM Given:    Additional Medicare Important Message give by:     If discussed at [place] of Stay Meetings, dates discussed:    Additional Comments:  [name], RN 12/30/2014, 2:50 PM

## 2014-12-30 NOTE — Telephone Encounter (Signed)
Patient called to inform you she is inpatient @ [facility].  She states she signed pain contract with [facility] and is now under care @ [facility] and has some questions concerning her pain medications.

## 2014-12-30 NOTE — Progress Notes (Addendum)
[facility] Progress Note   Andrea [name]  JZP:[id number]  DOB: 1970-07-21  DOA: 12/29/2014 PCP: [name], MD  Brief narrative: Andrea [name] is a 44 y.o. 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 Earlie Server due to severe left hip pain radiating to the thigh and calf and limiting ambulation. History from chart reveals that she recently had complaints of lumbar pain and underwent imaging which showed a left iliac mass and underwent a biopsy on 12/21/14 which was positive for above-mentioned epistatic adenocarcinoma. 9/6 was her initial visit with Dr. Julien Nordmann. She was prescribed OxyContin for pain but this was $200 and was unable to afford this. She did have oxycodone and was taking anywhere from 15-35 mg every 4-6 hours but pain continued to be uncontrolled. She is admitted for pain management with radiation and further staging workup of cancer.   Subjective: Pain in left hip is better controlled on current dose of OxyContin just 20 mg twice a day. She is able to ambulate.  Assessment/Plan: Principal Problem: Metastasis of left hip region/   Uncontrolled pain -She is unable to afford OxyContin--cancer center will see what they can do to help her afford it -Continue oxycodone and Dilaudid for breakthrough pain -Radiation oncology consulted  Active Problems:    Lung cancer -Biopsy of left hip lesion reveals Non-small cell adenocarcinoma stage IV -Imaging reveals right hilar, subcarinal adenopathy and left iliac chain adenopathy in addition to the above-mentioned left posterior iliac bone met -More of the brain negative for metastasis -Has been evaluated by Dr. Earlie Server- awaiting EGFR mutation status for deciding on chemotherapy    Hypokalemia -Replacing   Smoker -Continue nicotine patch-counseled to stop smoking  Code Status:     Code  Status Orders        Start     Ordered   12/29/14 1805  Full code   Continuous     12/29/14 1806     Family Communication: Mother at bedside Disposition Plan: Home when pain better controlled DVT prophylaxis: Lovenox Consultants: Medical Oncology, radiation oncology Procedures:  Antibiotics: Anti-infectives    None      Objective: There were no vitals filed for this visit.  Intake/Output Summary (Last 24 hours) at 12/30/14 1708 Last data filed at 12/30/14 1400  Gross per 24 hour  Intake      0 ml  Output    500 ml  Net   -500 ml     Vitals Filed Vitals:   12/29/14 1720 12/30/14 1430  BP: 98/61 100/60  Pulse: 80 77  Temp: 97.3 F (36.3 C) 98 F (36.7 C)  TempSrc: Oral Oral  Resp: 18 18  SpO2: 100% 100%    Exam:  General:  Pt is alert, not in acute distress  HEENT: No icterus, No thrush, oral mucosa moist  Cardiovascular: regular rate and rhythm, S1/S2 No murmur  Respiratory: clear to auscultation bilaterally   Abdomen: Soft, +Bowel sounds, non tender, non distended, no guarding  MSK: No LE edema, cyanosis or clubbing- swelling in left iliac area with tenderness  Data Reviewed: Basic Metabolic Panel:  Recent Labs Lab 12/29/14 1529 12/30/14 0405  NA 141 136  K 3.1* 3.4*  CL  --  106  CO2 21* 23  GLUCOSE 96 93  BUN 5.6* 6  CREATININE 0.8 0.64  CALCIUM 10.2 9.2   Liver Function Tests:  Recent Labs Lab  12/29/14 1529  AST 16  ALT 9  ALKPHOS 82  BILITOT 0.49  PROT 7.4  ALBUMIN 3.3*   No results for input(s): LIPASE, AMYLASE in the last 168 hours. No results for input(s): AMMONIA in the last 168 hours. CBC:  Recent Labs Lab 12/29/14 1529 12/30/14 0405  WBC 5.7 6.3  NEUTROABS 3.4  --   HGB 12.9 11.2*  HCT 37.2 32.7*  MCV 94.3 95.1  PLT 385 355   Cardiac Enzymes: No results for input(s): CKTOTAL, CKMB, CKMBINDEX, TROPONINI in the last 168 hours. BNP (last 3 results) No results for input(s): BNP in the last 8760  hours.  ProBNP (last 3 results) No results for input(s): PROBNP in the last 8760 hours.  CBG: No results for input(s): GLUCAP in the last 168 hours.  Recent Results (from the past 240 hour(s))  Tissue culture     Status: None   Collection Time: 12/21/14 10:21 AM  Result Value Ref Range Status   Specimen Description TISSUE  Final   Special Requests LEFT ILIUM LESION  Final   Gram Stain   Final    NO WBC SEEN NO ORGANISMS SEEN Performed at [facility]    Culture   Final    NO GROWTH 3 DAYS Performed at [facility]    Report Status 12/25/2014 FINAL  Final     Studies: Ct Chest W Contrast  12/29/2014   CLINICAL DATA:  44 year old female with recent diagnosis of metastatic adenocarcinoma of the lung. Left iliac mass all. Patient presenting with severe left hip pain radiating to the left calf and thigh.  EXAM: CT CHEST, ABDOMEN, AND PELVIS WITH CONTRAST  TECHNIQUE: Multidetector CT imaging of the chest, abdomen and pelvis was performed following the standard protocol during bolus administration of intravenous contrast.  CONTRAST:  74m OMNIPAQUE IOHEXOL 300 MG/ML SOLN, 1075mOMNIPAQUE IOHEXOL 300 MG/ML SOLN  COMPARISON:  CT dated 12/21/2014 and MRI dated 12/14/2014.  FINDINGS: CT CHEST FINDINGS  Mild centrilobular emphysema. There is no focal consolidation, pleural effusion, or pneumothorax. Low attenuating nodular soft tissue densities in the right hilar region and surrounding the right lower lobe pulmonary artery most compatible with enlarged lymph nodes. The largest measures up to 1.5 cm in diameter. A 1.7 x 1.1 cm subcarinal adenopathy noted. low attenuation within these lymph nodes likely represent necrotic changes.The central airways are patent.  The thoracic aorta and pulmonary arteries are unremarkable. No cardiomegaly or pericardial effusion. The thyroid gland is unremarkable. The esophagus is collapsed.  There is no axillary adenopathy. The chest wall and soft tissues  appear unremarkable. The osseous structures of the thorax are intact. No acute fracture or osseous lesion is identified.  CT ABDOMEN AND PELVIS FINDINGS  No intra-abdominal free air.  Trace free fluid within the pelvis.  Subcentimeter right hepatic hypodense lesion is too small to characterize. The liver is otherwise unremarkable. The gallbladder, the pancreas, spleen, adrenal glands appear unremarkable. A 1 cm right renal hypodense lesion likely represents a cyst. Subcentimeter left renal hypodense lesions are too small to characterize but likely represent cysts. Ultrasound is recommended for further characterization is of the kidneys. There is no hydronephrosis on either side. The visualized ureters and urinary bladder appear unremarkable. The uterus and ovaries are grossly unremarkable.  There is no evidence of bowel obstruction or inflammation. Normal appendix. The abdominal aorta and IVC appear unremarkable. The origins of the celiac axis, SMA, IMA as well as the origins of the renal arteries are patent.  No portal venous gas identified. Left iliac chain adenopathy noted.  A 3.8 x 3.8 cm soft tissue mass is noted in the left posterior iliac bone which appears slightly the gait compared to the prior study. There is apparent extension of the lesion into the left SI joint. There is associated destruction of the bone. Wake it is a there is degenerative changes at L5-S1.  IMPRESSION: Right hilar and subcarinal adenopathy.  Left posterior iliac bone known soft tissue mass most compatible with malignancy. This lesion appears slightly larger compared to the prior study and extends into the left SI joint.  Left iliac chain adenopathy.   Electronically Signed   By: Anner Crete M.D.   On: 12/29/2014 23:34   Mr Jeri Cos WU Contrast  12/30/2014   CLINICAL DATA:  Initial evaluation for possible metastatic disease. Recently diagnosed lung cancer  EXAM: MRI HEAD WITHOUT AND WITH CONTRAST  TECHNIQUE: Multiplanar, multiecho  pulse sequences of the brain and surrounding structures were obtained without and with intravenous contrast.  CONTRAST:  83m MULTIHANCE GADOBENATE DIMEGLUMINE 529 MG/ML IV SOLN  COMPARISON:  None.  FINDINGS: The CSF containing spaces are within normal limits for patient age. No focal parenchymal signal abnormality is identified. No mass lesion, midline shift, or extra-axial fluid collection. Ventricles are normal in size without evidence of hydrocephalus.  No diffusion-weighted signal abnormality is identified to suggest acute intracranial infarct. Gray-white matter differentiation is maintained. Normal flow voids are seen within the intracranial vasculature. No intracranial hemorrhage identified.  The cervicomedullary junction is normal. Pituitary gland is within normal limits. Pituitary stalk is midline. The globes and optic nerves demonstrate a normal appearance with normal signal intensity. 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 No abnormal enhancement within the brain itself. Tiny focus of nodular enhancement within the left frontal lobe favored to be vascular nature (series 13, image 43).  The bone marrow signal intensity is normal. Calvarium is intact. Visualized upper cervical spine is within normal limits.  Scalp soft tissues are unremarkable.  Mild scattered opacity within the frontal sinuses and anterior ethmoidal air cells. Paranasal sinuses are otherwise clear. No mastoid effusion.  IMPRESSION: Normal brain MRI with no acute intracranial process identified. No evidence for intracranial metastasis.   Electronically Signed   By: BJeannine BogaM.D.   On: 12/30/2014 01:44   Ct Abdomen Pelvis W Contrast  12/29/2014   CLINICAL DATA:  44year old female with recent diagnosis of metastatic adenocarcinoma of the lung. Left iliac mass all. Patient presenting with severe left hip pain radiating to the left calf and thigh.  EXAM: CT CHEST, ABDOMEN, AND PELVIS WITH  CONTRAST  TECHNIQUE: Multidetector CT imaging of the chest, abdomen and pelvis was performed following the standard protocol during bolus administration of intravenous contrast.  CONTRAST:  562mOMNIPAQUE IOHEXOL 300 MG/ML SOLN, 10062mMNIPAQUE IOHEXOL 300 MG/ML SOLN  COMPARISON:  CT dated 12/21/2014 and MRI dated 12/14/2014.  FINDINGS: CT CHEST FINDINGS  Mild centrilobular emphysema. There is no focal consolidation, pleural effusion, or pneumothorax. Low attenuating nodular soft tissue densities in the right hilar region and surrounding the right lower lobe pulmonary artery most compatible with enlarged lymph nodes. The largest measures up to 1.5 cm in diameter. A 1.7 x 1.1 cm subcarinal adenopathy noted. low attenuation within these lymph nodes likely represent necrotic changes.The central airways are patent.  The thoracic aorta and pulmonary arteries are unremarkable. No cardiomegaly or pericardial effusion. The thyroid gland is unremarkable. The esophagus  is collapsed.  There is no axillary adenopathy. The chest wall and soft tissues appear unremarkable. The osseous structures of the thorax are intact. No acute fracture or osseous lesion is identified.  CT ABDOMEN AND PELVIS FINDINGS  No intra-abdominal free air.  Trace free fluid within the pelvis.  Subcentimeter right hepatic hypodense lesion is too small to characterize. The liver is otherwise unremarkable. The gallbladder, the pancreas, spleen, adrenal glands appear unremarkable. A 1 cm right renal hypodense lesion likely represents a cyst. Subcentimeter left renal hypodense lesions are too small to characterize but likely represent cysts. Ultrasound is recommended for further characterization is of the kidneys. There is no hydronephrosis on either side. The visualized ureters and urinary bladder appear unremarkable. The uterus and ovaries are grossly unremarkable.  There is no evidence of bowel obstruction or inflammation. Normal appendix. The abdominal  aorta and IVC appear unremarkable. The origins of the celiac axis, SMA, IMA as well as the origins of the renal arteries are patent. No portal venous gas identified. Left iliac chain adenopathy noted.  A 3.8 x 3.8 cm soft tissue mass is noted in the left posterior iliac bone which appears slightly the gait compared to the prior study. There is apparent extension of the lesion into the left SI joint. There is associated destruction of the bone. Wake it is a there is degenerative changes at L5-S1.  IMPRESSION: Right hilar and subcarinal adenopathy.  Left posterior iliac bone known soft tissue mass most compatible with malignancy. This lesion appears slightly larger compared to the prior study and extends into the left SI joint.  Left iliac chain adenopathy.   Electronically Signed   By: Anner Crete M.D.   On: 12/29/2014 23:34   Dg Hip Unilat With Pelvis 2-3 Views Left  12/29/2014   CLINICAL DATA:  Uncontrollable left hip pain. History of back pain radiating to the left leg. Recent diagnosis of metastatic lung cancer and left ilium mass.  EXAM: DG HIP (WITH OR WITHOUT PELVIS) 2-3V LEFT  COMPARISON:  CT abdomen and pelvis 12/29/2014  FINDINGS: Examination is technically limited due to patient rotation hand due to contrast material in overlying bowel hand bladder which limits evaluation of the pelvis. There is no evidence of acute fracture or dislocation of the left hip. Mild degenerative changes are present. The known bone lesion in the left ilium is not well delineated. Visualize soft tissues are unremarkable.  IMPRESSION: Limited study due to residual contrast material obscuring visualization. No evidence of acute fracture or dislocation in the left hip.   Electronically Signed   By: Lucienne Capers M.D.   On: 12/29/2014 23:08    Scheduled Meds:  Scheduled Meds: . docusate sodium  100 mg Oral BID  . enoxaparin (LOVENOX) injection  40 mg Subcutaneous Q24H  . feeding supplement (ENSURE ENLIVE)  237 mL  Oral TID BM  . nicotine  14 mg Transdermal Daily  . OxyCODONE  20 mg Oral Q12H   Continuous Infusions:    Time spent on care of this patient: 35 min   [place], MD 12/30/2014, 5:08 PM  LOS: 1 day   [facility] Office  [phone] Pager - Text Page per [email or web address] If 7PM-7AM, please contact night-coverage [email or web address]

## 2014-12-31 ENCOUNTER — Ambulatory Visit
Admit: 2014-12-31 | Discharge: 2014-12-31 | Disposition: A | Discharge status: Home / Self Care | Payer: Medicaid Other | Source: Ambulatory Visit | Attending: Radiation Oncology | Admitting: Radiation Oncology

## 2014-12-31 DIAGNOSIS — Z510 Encounter for antineoplastic radiation therapy: Secondary | ICD-10-CM | POA: Insufficient documentation

## 2014-12-31 DIAGNOSIS — Z79899 Other long term (current) drug therapy: Secondary | ICD-10-CM | POA: Insufficient documentation

## 2014-12-31 DIAGNOSIS — Z885 Allergy status to narcotic agent status: Secondary | ICD-10-CM | POA: Insufficient documentation

## 2014-12-31 DIAGNOSIS — F17210 Nicotine dependence, cigarettes, uncomplicated: Secondary | ICD-10-CM | POA: Insufficient documentation

## 2014-12-31 DIAGNOSIS — C7951 Secondary malignant neoplasm of bone: Secondary | ICD-10-CM | POA: Insufficient documentation

## 2014-12-31 DIAGNOSIS — R52 Pain, unspecified: Secondary | ICD-10-CM | POA: Insufficient documentation

## 2014-12-31 DIAGNOSIS — R531 Weakness: Secondary | ICD-10-CM | POA: Insufficient documentation

## 2014-12-31 DIAGNOSIS — C3490 Malignant neoplasm of unspecified part of unspecified bronchus or lung: Secondary | ICD-10-CM | POA: Insufficient documentation

## 2014-12-31 LAB — BASIC METABOLIC PANEL
ANION GAP: 8 (ref 5–15)
BUN: <5 mg/dL — ABNORMAL LOW (ref 6–20)
CHLORIDE: 105 mmol/L (ref 101–111)
CO2: 23 mmol/L (ref 22–32)
Calcium: 9.2 mg/dL (ref 8.9–10.3)
Creatinine, Ser: 0.68 mg/dL (ref 0.44–1.00)
GFR calc non Af Amer: >60 mL/min (ref >60)
Glucose, Bld: 100 mg/dL — ABNORMAL HIGH (ref 65–99)
POTASSIUM: 3.6 mmol/L (ref 3.5–5.1)
SODIUM: 136 mmol/L (ref 135–145)

## 2014-12-31 NOTE — Progress Notes (Signed)
Complex simulation/treatment planning note: The patient was taken to the CT simulator.  She could not lie supine because of pain.  Therefore she was placed prone.  Her pelvis was scanned.  The CT data set was sent to the planning system where I contoured her tumor/CTV.  She was set up to AP and PA fields.  2 separate multileaf collimators were designed to conform the field.  Prescribing 3500 cGy in 14 sessions.  She is now ready for dosimetry planning.

## 2014-12-31 NOTE — Plan of Care (Signed)
Problem: Phase II Progression Outcomes Goal: Progress activity as tolerated unless otherwise ordered Outcome: Progressing Pt transfers to [facility], but not walking around room due to hip pain with ambulation.

## 2014-12-31 NOTE — Progress Notes (Signed)
[facility] Progress Note   Andrea [name]  CZY:[id number]  DOB: 11/02/1970  DOA: 12/29/2014 PCP: [name], MD  Brief narrative: Andrea [name] is a 44 y.o. 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 Earlie Server due to severe left hip pain radiating to the thigh and calf and limiting ambulation. History from chart reveals that she recently had complaints of lumbar pain and underwent imaging which showed a left iliac mass and underwent a biopsy on 12/21/14 which was positive for above-mentioned epistatic adenocarcinoma. 9/6 was her initial visit with Dr. Julien Nordmann. She was prescribed OxyContin for pain but this was $200 and was unable to afford this. She did have oxycodone and was taking anywhere from 15-35 mg every 4-6 hours but pain continued to be uncontrolled. She is admitted for pain management with radiation and further staging workup of cancer.   Subjective: Pain in left hip is controlled at rest. It worsens when she gets up and ambulates. No new symptoms  Assessment/Plan: Principal Problem: Metastasis of left hip region/   Uncontrolled pain -She is unable to afford OxyContin--cancer center will see what they can do to help her afford it -Continue oxycodone and IV Dilaudid for breakthrough pain -Radiation oncology consulted and planned to start radiation  Active Problems:    Lung cancer -Biopsy of left hip lesion reveals Non-small cell adenocarcinoma stage IV -Imaging reveals right hilar, subcarinal adenopathy and left iliac chain adenopathy in addition to the above-mentioned left posterior iliac bone met -MRI of the brain negative for metastasis -Has been evaluated by Dr. Earlie Server- awaiting EGFR mutation status for deciding on chemotherapy    Hypokalemia -Replacing   Smoker -Continue nicotine patch-counseled to stop smoking  Code  Status:     Code Status Orders        Start     Ordered   12/29/14 1805  Full code   Continuous     12/29/14 1806     Family Communication: Mother at bedside Disposition Plan: Home when pain better controlled DVT prophylaxis: Lovenox Consultants: Medical Oncology, radiation oncology Procedures:  Antibiotics: Anti-infectives    None      Objective: There were no vitals filed for this visit.  Intake/Output Summary (Last 24 hours) at 12/31/14 1550 Last data filed at 12/31/14 1256  Gross per 24 hour  Intake   1402 ml  Output   1100 ml  Net    302 ml     Vitals Filed Vitals:   12/30/14 1430 12/30/14 2150 12/31/14 0630 12/31/14 1219  BP: 100/60 116/64 104/64 107/76  Pulse: 77 85 71 70  Temp: 98 F (36.7 C) 97.9 F (36.6 C) 98.1 F (36.7 C) 98 F (36.7 C)  TempSrc: Oral Oral Oral Oral  Resp: _0 SpO2: 100% 100% 100% 100%    Exam:  General:  Pt is alert, not in acute distress  HEENT: No icterus, No thrush, oral mucosa moist  Cardiovascular: regular rate and rhythm, S1/S2 No murmur  Respiratory: clear to auscultation bilaterally   Abdomen: Soft, +Bowel sounds, non tender, non distended, no guarding  MSK: No LE edema, cyanosis or clubbing- swelling in left iliac area with tenderness  Data Reviewed: Basic Metabolic Panel:  Recent Labs Lab 12/29/14 1529 12/30/14 0405 12/31/14 0400  NA 141 136 136  K 3.1* 3.4* 3.6  CL  --  106 105  CO2 21*  23 23  GLUCOSE 96 93 100*  BUN 5.6* 6 <5*  CREATININE 0.8 0.64 0.68  CALCIUM 10.2 9.2 9.2   Liver Function Tests:  Recent Labs Lab 12/29/14 1529  AST 16  ALT 9  ALKPHOS 82  BILITOT 0.49  PROT 7.4  ALBUMIN 3.3*   No results for input(s): LIPASE, AMYLASE in the last 168 hours. No results for input(s): AMMONIA in the last 168 hours. CBC:  Recent Labs Lab 12/29/14 1529 12/30/14 0405  WBC 5.7 6.3  NEUTROABS 3.4  --   HGB 12.9 11.2*  HCT 37.2 32.7*  MCV 94.3 95.1  PLT 385 355    Cardiac Enzymes: No results for input(s): CKTOTAL, CKMB, CKMBINDEX, TROPONINI in the last 168 hours. BNP (last 3 results) No results for input(s): BNP in the last 8760 hours.  ProBNP (last 3 results) No results for input(s): PROBNP in the last 8760 hours.  CBG: No results for input(s): GLUCAP in the last 168 hours.  No results found for this or any previous visit (from the past 240 hour(s)).   Studies: Ct Chest W Contrast  12/29/2014   CLINICAL DATA:  44 year old female with recent diagnosis of metastatic adenocarcinoma of the lung. Left iliac mass all. Patient presenting with severe left hip pain radiating to the left calf and thigh.  EXAM: CT CHEST, ABDOMEN, AND PELVIS WITH CONTRAST  TECHNIQUE: Multidetector CT imaging of the chest, abdomen and pelvis was performed following the standard protocol during bolus administration of intravenous contrast.  CONTRAST:  49m OMNIPAQUE IOHEXOL 300 MG/ML SOLN, 1052mOMNIPAQUE IOHEXOL 300 MG/ML SOLN  COMPARISON:  CT dated 12/21/2014 and MRI dated 12/14/2014.  FINDINGS: CT CHEST FINDINGS  Mild centrilobular emphysema. There is no focal consolidation, pleural effusion, or pneumothorax. Low attenuating nodular soft tissue densities in the right hilar region and surrounding the right lower lobe pulmonary artery most compatible with enlarged lymph nodes. The largest measures up to 1.5 cm in diameter. A 1.7 x 1.1 cm subcarinal adenopathy noted. low attenuation within these lymph nodes likely represent necrotic changes.The central airways are patent.  The thoracic aorta and pulmonary arteries are unremarkable. No cardiomegaly or pericardial effusion. The thyroid gland is unremarkable. The esophagus is collapsed.  There is no axillary adenopathy. The chest wall and soft tissues appear unremarkable. The osseous structures of the thorax are intact. No acute fracture or osseous lesion is identified.  CT ABDOMEN AND PELVIS FINDINGS  No intra-abdominal free air.  Trace  free fluid within the pelvis.  Subcentimeter right hepatic hypodense lesion is too small to characterize. The liver is otherwise unremarkable. The gallbladder, the pancreas, spleen, adrenal glands appear unremarkable. A 1 cm right renal hypodense lesion likely represents a cyst. Subcentimeter left renal hypodense lesions are too small to characterize but likely represent cysts. Ultrasound is recommended for further characterization is of the kidneys. There is no hydronephrosis on either side. The visualized ureters and urinary bladder appear unremarkable. The uterus and ovaries are grossly unremarkable.  There is no evidence of bowel obstruction or inflammation. Normal appendix. The abdominal aorta and IVC appear unremarkable. The origins of the celiac axis, SMA, IMA as well as the origins of the renal arteries are patent. No portal venous gas identified. Left iliac chain adenopathy noted.  A 3.8 x 3.8 cm soft tissue mass is noted in the left posterior iliac bone which appears slightly the gait compared to the prior study. There is apparent extension of the lesion into the left SI joint. There is  associated destruction of the bone. Wake it is a there is degenerative changes at L5-S1.  IMPRESSION: Right hilar and subcarinal adenopathy.  Left posterior iliac bone known soft tissue mass most compatible with malignancy. This lesion appears slightly larger compared to the prior study and extends into the left SI joint.  Left iliac chain adenopathy.   Electronically Signed   By: Anner Crete M.D.   On: 12/29/2014 23:34   Mr Jeri Cos QJ Contrast  12/30/2014   CLINICAL DATA:  Initial evaluation for possible metastatic disease. Recently diagnosed lung cancer  EXAM: MRI HEAD WITHOUT AND WITH CONTRAST  TECHNIQUE: Multiplanar, multiecho pulse sequences of the brain and surrounding structures were obtained without and with intravenous contrast.  CONTRAST:  38m MULTIHANCE GADOBENATE DIMEGLUMINE 529 MG/ML IV SOLN   COMPARISON:  None.  FINDINGS: The CSF containing spaces are within normal limits for patient age. No focal parenchymal signal abnormality is identified. No mass lesion, midline shift, or extra-axial fluid collection. Ventricles are normal in size without evidence of hydrocephalus.  No diffusion-weighted signal abnormality is identified to suggest acute intracranial infarct. Gray-white matter differentiation is maintained. Normal flow voids are seen within the intracranial vasculature. No intracranial hemorrhage identified.  The cervicomedullary junction is normal. Pituitary gland is within normal limits. Pituitary stalk is midline. The globes and optic nerves demonstrate a normal appearance with normal signal intensity. 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 No abnormal enhancement within the brain itself. Tiny focus of nodular enhancement within the left frontal lobe favored to be vascular nature (series 13, image 43).  The bone marrow signal intensity is normal. Calvarium is intact. Visualized upper cervical spine is within normal limits.  Scalp soft tissues are unremarkable.  Mild scattered opacity within the frontal sinuses and anterior ethmoidal air cells. Paranasal sinuses are otherwise clear. No mastoid effusion.  IMPRESSION: Normal brain MRI with no acute intracranial process identified. No evidence for intracranial metastasis.   Electronically Signed   By: BJeannine BogaM.D.   On: 12/30/2014 01:44   Ct Abdomen Pelvis W Contrast  12/29/2014   CLINICAL DATA:  44year old female with recent diagnosis of metastatic adenocarcinoma of the lung. Left iliac mass all. Patient presenting with severe left hip pain radiating to the left calf and thigh.  EXAM: CT CHEST, ABDOMEN, AND PELVIS WITH CONTRAST  TECHNIQUE: Multidetector CT imaging of the chest, abdomen and pelvis was performed following the standard protocol during bolus administration of intravenous contrast.   CONTRAST:  583mOMNIPAQUE IOHEXOL 300 MG/ML SOLN, 10064mMNIPAQUE IOHEXOL 300 MG/ML SOLN  COMPARISON:  CT dated 12/21/2014 and MRI dated 12/14/2014.  FINDINGS: CT CHEST FINDINGS  Mild centrilobular emphysema. There is no focal consolidation, pleural effusion, or pneumothorax. Low attenuating nodular soft tissue densities in the right hilar region and surrounding the right lower lobe pulmonary artery most compatible with enlarged lymph nodes. The largest measures up to 1.5 cm in diameter. A 1.7 x 1.1 cm subcarinal adenopathy noted. low attenuation within these lymph nodes likely represent necrotic changes.The central airways are patent.  The thoracic aorta and pulmonary arteries are unremarkable. No cardiomegaly or pericardial effusion. The thyroid gland is unremarkable. The esophagus is collapsed.  There is no axillary adenopathy. The chest wall and soft tissues appear unremarkable. The osseous structures of the thorax are intact. No acute fracture or osseous lesion is identified.  CT ABDOMEN AND PELVIS FINDINGS  No intra-abdominal free air.  Trace free fluid within the pelvis.  Subcentimeter right hepatic hypodense lesion is too small to characterize. The liver is otherwise unremarkable. The gallbladder, the pancreas, spleen, adrenal glands appear unremarkable. A 1 cm right renal hypodense lesion likely represents a cyst. Subcentimeter left renal hypodense lesions are too small to characterize but likely represent cysts. Ultrasound is recommended for further characterization is of the kidneys. There is no hydronephrosis on either side. The visualized ureters and urinary bladder appear unremarkable. The uterus and ovaries are grossly unremarkable.  There is no evidence of bowel obstruction or inflammation. Normal appendix. The abdominal aorta and IVC appear unremarkable. The origins of the celiac axis, SMA, IMA as well as the origins of the renal arteries are patent. No portal venous gas identified. Left iliac chain  adenopathy noted.  A 3.8 x 3.8 cm soft tissue mass is noted in the left posterior iliac bone which appears slightly the gait compared to the prior study. There is apparent extension of the lesion into the left SI joint. There is associated destruction of the bone. Wake it is a there is degenerative changes at L5-S1.  IMPRESSION: Right hilar and subcarinal adenopathy.  Left posterior iliac bone known soft tissue mass most compatible with malignancy. This lesion appears slightly larger compared to the prior study and extends into the left SI joint.  Left iliac chain adenopathy.   Electronically Signed   By: Anner Crete M.D.   On: 12/29/2014 23:34   Dg Hip Unilat With Pelvis 2-3 Views Left  12/29/2014   CLINICAL DATA:  Uncontrollable left hip pain. History of back pain radiating to the left leg. Recent diagnosis of metastatic lung cancer and left ilium mass.  EXAM: DG HIP (WITH OR WITHOUT PELVIS) 2-3V LEFT  COMPARISON:  CT abdomen and pelvis 12/29/2014  FINDINGS: Examination is technically limited due to patient rotation hand due to contrast material in overlying bowel hand bladder which limits evaluation of the pelvis. There is no evidence of acute fracture or dislocation of the left hip. Mild degenerative changes are present. The known bone lesion in the left ilium is not well delineated. Visualize soft tissues are unremarkable.  IMPRESSION: Limited study due to residual contrast material obscuring visualization. No evidence of acute fracture or dislocation in the left hip.   Electronically Signed   By: Lucienne Capers M.D.   On: 12/29/2014 23:08    Scheduled Meds:  Scheduled Meds: . docusate sodium  100 mg Oral BID  . enoxaparin (LOVENOX) injection  40 mg Subcutaneous Q24H  . feeding supplement (ENSURE ENLIVE)  237 mL Oral TID BM  . nicotine  14 mg Transdermal Daily  . OxyCODONE  20 mg Oral Q12H   Continuous Infusions:    Time spent on care of this patient: 25 min   [place],  MD 12/31/2014, 3:50 PM  LOS: 2 days   [facility] Office  [phone] Pager - Text Page per [email or web address] If 7PM-7AM, please contact night-coverage [email or web address]

## 2014-12-31 NOTE — Addendum Note (Signed)
Encounter addended by: Arloa Koh, MD on: 12/31/2014  8:27 PM<BR>     Documentation filed: Notes Section

## 2014-12-31 NOTE — Progress Notes (Signed)
Treatment planning note: The patient completed her treatment planning/dosimetry for treatment to her left iliac bone.  She was setup to AP and PA fields.  2 unique MLCs were designed to conform the field.  An isodose plan is reviewed.  Again, I am prescribing 3500 cGy in 14 sessions utilizing mixed 10 MV/15 MV photons.

## 2014-12-31 NOTE — Progress Notes (Signed)
Radiation oncology progress note:  The patient underwent CT simulation early today.  She is currently undergoing treatment planning and we expect to begin her radiation therapy tomorrow afternoon.  I anticipate delivering 3500 cGy in 14 sessions over a period of almost 3 weeks.

## 2015-01-01 ENCOUNTER — Ambulatory Visit
Admit: 2015-01-01 | Discharge: 2015-01-01 | Disposition: A | Discharge status: Home / Self Care | Payer: Medicaid Other | Attending: Radiation Oncology | Admitting: Radiation Oncology

## 2015-01-01 DIAGNOSIS — C3491 Malignant neoplasm of unspecified part of right bronchus or lung: Secondary | ICD-10-CM

## 2015-01-01 DIAGNOSIS — Z510 Encounter for antineoplastic radiation therapy: Secondary | ICD-10-CM | POA: Diagnosis not present

## 2015-01-01 HISTORY — DX: Malignant neoplasm of unspecified part of right bronchus or lung: C34.91

## 2015-01-01 MED ORDER — BISACODYL 5 MG PO TBEC
10.0000 mg | DELAYED_RELEASE_TABLET | Freq: Once | ORAL | Status: AC
Start: 2015-01-01 — End: 2015-01-01
  Administered 2015-01-01: 10 mg via ORAL
  Filled 2015-01-01: qty 2

## 2015-01-01 NOTE — Evaluation (Addendum)
Physical Therapy Evaluation Patient Details Name: Andrea [name] MRN: [id number] DOB: 11/12/1970 Today's Date: 01/01/2015   History of Present Illness  44 yo female admitted iwth L hip mass. Hx of metastatic lung cancer.   Clinical Impression  On eval, pt was supervision level for mobility-walked ~20 feet with use of crutches. Pt has been using crutches PTA. Pain rated 9/10. Pt is unable to tolerate sitting or supine positions. Pt mobilized fairly well with crutches. Pt states she plans to d/c to a friend's home. Feel pt could benefit from a BSC to allow for ease of sit<>stand transitions. Informed pt that CM would have to look into whether a BSC could be provided to her. Pt is also requesting a reacher-will check with OT to see if we have any that we can give to her. Will attempt to follow during hospital stay as able.    Follow Up Recommendations No PT follow up;Supervision - Intermittent    Equipment Recommendations  3in1 (PT) (Pt is also requesting reacher. Will check with OT to see if we are able to provide one.)    Recommendations for Other Services       Precautions / Restrictions Precautions Precautions: None Restrictions Weight Bearing Restrictions: No      Mobility  Bed Mobility Overal bed mobility: Modified Independent             General bed mobility comments: pt gets into prone position than rises from bed . Increased time.   Transfers Overall transfer level: Needs assistance Equipment used: None Transfers: Sit to/from Stand Sit to Stand: Supervision         General transfer comment: for safety.   Ambulation/Gait Ambulation/Gait assistance: Supervision Ambulation Distance (Feet): 20 Feet Assistive device: Crutches Gait Pattern/deviations: Step-to pattern     General Gait Details: Pt unable to tolerate WBing on L LE so she maintains NWB. Fairly steady with crutches-pt prefers these to walker. Pt states pain not as severe when upright and  walking.  Stairs            Wheelchair Mobility    Modified Rankin (Stroke Patients Only)       Balance Overall balance assessment: Needs assistance         Standing balance support: During functional activity Standing balance-Leahy Scale: Fair                               Pertinent Vitals/Pain Pain Assessment: 0-10 Pain Score: 9  Pain Location: posteior L hip/iliac crest area Pain Descriptors / Indicators: Tender;Sore;Aching;Burning;Guarding;Grimacing Pain Intervention(s): Monitored during session;Limited activity within patient's tolerance;Repositioned    Home Living Family/patient expects to be discharged to:: Private residence Living Arrangements: Non-relatives/Friends     Home Access: Level entry       Home Equipment: Environmental consultant - 2 wheels;Crutches;Cane - single point Additional Comments: ptt is requesting BSC and reacher    Prior Function Level of Independence: Independent with assistive device(s)               Hand Dominance        Extremity/Trunk Assessment   Upper Extremity Assessment: Overall WFL for tasks assessed           Lower Extremity Assessment: LLE deficits/detail   LLE Deficits / Details: hip ROM 0-90 degrees in sidelying (pt unable to tolerate supine or sitting positions). Strength at least 3/5 throughout but limited by pain.     Communication  Communication: No difficulties  Cognition Arousal/Alertness: Awake/alert Behavior During Therapy: WFL for tasks assessed/performed Overall Cognitive Status: Within Functional Limits for tasks assessed                      General Comments      Exercises        Assessment/Plan    PT Assessment Patient needs continued PT services  PT Diagnosis Difficulty walking;Abnormality of gait;Acute pain   PT Problem List Decreased range of motion;Decreased activity tolerance;Decreased mobility;Pain  PT Treatment Interventions DME instruction;Gait  training;Functional mobility training;Therapeutic activities;Therapeutic exercise;Patient/family education   PT Goals (Current goals can be found in the Care Plan section) Acute Rehab PT Goals Patient Stated Goal: less pain PT Goal Formulation: With patient Time For Goal Achievement: 01/15/15 Potential to Achieve Goals: Good    Frequency Min 3X/week   Barriers to discharge        Co-evaluation               End of Session   Activity Tolerance: Patient limited by pain Patient left: in bed;with call bell/phone within reach;with family/visitor present           Time: 7169-6789 PT Time Calculation (min) (ACUTE ONLY): 16 min   Charges:   PT Evaluation $Initial PT Evaluation Tier I: 1 Procedure     PT G Codes:        Weston Anna, MPT Pager: [phone]

## 2015-01-01 NOTE — Progress Notes (Signed)
[facility] Progress Note   Andrea [name]  TIR:[id number]  DOB: [birth year]  DOA: 12/29/2014 PCP: Andrea Nims, MD  Brief narrative: Andrea [name] is a 44 y.o. 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 Earlie [name] due to severe left hip pain radiating to the thigh and calf and limiting ambulation. History from chart reveals that she recently had complaints of lumbar pain and underwent imaging which showed a left iliac mass and underwent a biopsy on 12/21/14 which was positive for above-mentioned epistatic adenocarcinoma. 9/6 was her initial visit with Dr. Julien [name]. She was prescribed OxyContin for pain but this was $200 and was unable to afford this. She did have oxycodone and was taking anywhere from 15-35 mg every 4-6 hours but pain continued to be uncontrolled. She is admitted for pain management with radiation and further staging workup of cancer.   Subjective: No change in pain and left hip  Assessment/Plan: Principal Problem: Metastasis of left hip region/   Uncontrolled pain -She is unable to afford OxyContin--unfortunately there is no financial assistance that can be given with this medication-I have discussed that she will need to take oxycodone's that he already has at home to help control her pain -Continue oxycodone and IV Dilaudid for breakthrough pain -Radiation oncology consulted and radiation started yesterday  Active Problems:    Lung cancer -Biopsy of left hip lesion reveals Non-small cell adenocarcinoma stage IV -Imaging reveals right hilar, subcarinal adenopathy and left iliac chain adenopathy in addition to the above-mentioned left posterior iliac bone met -MRI of the brain negative for metastasis -Has been evaluated by Dr. Earlie [name]- awaiting EGFR mutation status for deciding on chemotherapy  Severe constipation -States that  she has been "holding"her stool for about a month now as it is painful to sit on the toilet -refusing to take laxatives     Hypokalemia -Replacing   Smoker -Continue nicotine patch-counseled to stop smoking  Code Status:     Code Status Orders        Start     Ordered   12/29/14 1805  Full code   Continuous     12/29/14 1806     Family Communication: Mother at bedside Disposition Plan: Home when pain better controlled DVT prophylaxis: Lovenox Consultants: Medical Oncology, radiation oncology Procedures:  Antibiotics: Anti-infectives    None      Objective: Filed Weights   01/01/15 0452  Weight: 54.885 kg (121 lb)    Intake/Output Summary (Last 24 hours) at 01/01/15 1253 Last data filed at 01/01/15 1240  Gross per 24 hour  Intake   1122 ml  Output   1375 ml  Net   -253 ml     Vitals Filed Vitals:   12/31/14 2235 12/31/14 2328 01/01/15 0452 01/01/15 0519  BP:  106/72  101/57  Pulse: 78 79  79  Temp:  98 F (36.7 C)  98.6 F (37 C)  TempSrc:  Oral  Oral  Resp:  20  18  Height:   5' 6"  (1.676 m)   Weight:   54.885 kg (121 lb)   SpO2:  100%  100%    Exam:  General:  Pt is alert, not in acute distress  HEENT: No icterus, No thrush, oral mucosa moist  Cardiovascular: regular rate and rhythm, S1/S2 No murmur  Respiratory: clear to auscultation bilaterally   Abdomen: Soft, +Bowel sounds, non tender, non distended,  no guarding  MSK: No LE edema, cyanosis or clubbing- swelling in left iliac area with tenderness  Data Reviewed: Basic Metabolic Panel:  Recent Labs Lab 12/29/14 1529 12/30/14 0405 12/31/14 0400  NA 141 136 136  K 3.1* 3.4* 3.6  CL  --  106 105  CO2 21* 23 23  GLUCOSE 96 93 100*  BUN 5.6* 6 <5*  CREATININE 0.8 0.64 0.68  CALCIUM 10.2 9.2 9.2   Liver Function Tests:  Recent Labs Lab 12/29/14 1529  AST 16  ALT 9  ALKPHOS 82  BILITOT 0.49  PROT 7.4  ALBUMIN 3.3*   No results for input(s): LIPASE, AMYLASE in the  last 168 hours. No results for input(s): AMMONIA in the last 168 hours. CBC:  Recent Labs Lab 12/29/14 1529 12/30/14 0405  WBC 5.7 6.3  NEUTROABS 3.4  --   HGB 12.9 11.2*  HCT 37.2 32.7*  MCV 94.3 95.1  PLT 385 355   Cardiac Enzymes: No results for input(s): CKTOTAL, CKMB, CKMBINDEX, TROPONINI in the last 168 hours. BNP (last 3 results) No results for input(s): BNP in the last 8760 hours.  ProBNP (last 3 results) No results for input(s): PROBNP in the last 8760 hours.  CBG: No results for input(s): GLUCAP in the last 168 hours.  No results found for this or any previous visit (from the past 240 hour(s)).   Studies: No results found.  Scheduled Meds:  Scheduled Meds: . docusate sodium  100 mg Oral BID  . enoxaparin (LOVENOX) injection  40 mg Subcutaneous Q24H  . feeding supplement (ENSURE ENLIVE)  237 mL Oral TID BM  . nicotine  14 mg Transdermal Daily  . OxyCODONE  20 mg Oral Q12H   Continuous Infusions:    Time spent on care of this patient: 25 min   [place], MD 01/01/2015, 12:53 PM  LOS: 3 days   [facility] Office  [phone] Pager - Text Page per [email or web address] If 7PM-7AM, please contact night-coverage [email or web address]

## 2015-01-02 MED ORDER — SENNOSIDES-DOCUSATE SODIUM 8.6-50 MG PO TABS
2.0000 | ORAL_TABLET | Freq: Every day | ORAL | Status: DC
  Administered 2015-01-02 – 2015-01-03 (×2): 2 via ORAL
  Filled 2015-01-02 (×2): qty 2

## 2015-01-02 NOTE — Progress Notes (Signed)
[facility] Progress Note   Andrea [name]  FEX:[id number]  DOB: May 12, 1970  DOA: 12/29/2014 PCP: [name], MD  Brief narrative: Andrea [name] is a 44 y.o. 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 Earlie Server due to severe left hip pain radiating to the thigh and calf and limiting ambulation. History from chart reveals that she recently had complaints of lumbar pain and underwent imaging which showed a left iliac mass and underwent a biopsy on 12/21/14 which was positive for above-mentioned epistatic adenocarcinoma. 9/6 was her initial visit with Dr. Julien Nordmann. She was prescribed OxyContin for pain but this was $200 and was unable to afford this. She did have oxycodone and was taking anywhere from 15-35 mg every 4-6 hours but pain continued to be uncontrolled. She is admitted for pain management with radiation and further staging workup of cancer.   Subjective: No change in pain and left hip- was able to have a BM yesterday.   Assessment/Plan: Principal Problem: Metastasis of left hip region/   Uncontrolled pain -She is unable to afford OxyContin--unfortunately there is no financial assistance that can be given with this medication-I have discussed that she will need to take oxycodone's that he already has at home to help control her pain -Continue oxycodone and IV Dilaudid for breakthrough pain -Radiation oncology consulted and radiation started 9/8  Active Problems:    Lung cancer -Biopsy of left hip lesion reveals Non-small cell adenocarcinoma stage IV -Imaging reveals right hilar, subcarinal adenopathy and left iliac chain adenopathy in addition to the above-mentioned left posterior iliac bone met -MRI of the brain negative for metastasis -Has been evaluated by Dr. Earlie Server- awaiting EGFR mutation status for deciding on chemotherapy  Severe  constipation -States that she has been "holding"her stool for about a month now as it is painful to sit on the toilet -was able to have a BM with Dulcolax- cont Colace and Senna daily as she is on narcotics    Hypokalemia -Replacing   Smoker -Continue nicotine patch-counseled to stop smoking  Code Status:     Code Status Orders        Start     Ordered   12/29/14 1805  Full code   Continuous     12/29/14 1806     Family Communication:   Disposition Plan: Home when pain better controlled- likely after radiation on Monday DVT prophylaxis: Lovenox Consultants: Medical Oncology, radiation oncology Procedures:  Antibiotics: Anti-infectives    None      Objective: Filed Weights   01/01/15 0452  Weight: 54.885 kg (121 lb)    Intake/Output Summary (Last 24 hours) at 01/02/15 1128 Last data filed at 01/02/15 0900  Gross per 24 hour  Intake    360 ml  Output    100 ml  Net    260 ml     Vitals Filed Vitals:   01/01/15 0519 01/01/15 1343 01/01/15 2121 01/02/15 0529  BP: 101/57 126/75 102/60 90/68  Pulse: 79 64 89 75  Temp: 98.6 F (37 C) 98 F (36.7 C) 98.7 F (37.1 C) 98 F (36.7 C)  TempSrc: Oral Oral Oral Oral  Resp: 18 16 16 14   Height:      Weight:      SpO2: 100% 100% 92% 100%    Exam:  General:  Pt is alert, not in acute distress  HEENT: No icterus, No thrush,  oral mucosa moist  Cardiovascular: regular rate and rhythm, S1/S2 No murmur  Respiratory: clear to auscultation bilaterally   Abdomen: Soft, +Bowel sounds, non tender, non distended, no guarding  MSK: No LE edema, cyanosis or clubbing- swelling in left iliac area with tenderness  Data Reviewed: Basic Metabolic Panel:  Recent Labs Lab 12/29/14 1529 12/30/14 0405 12/31/14 0400  NA 141 136 136  K 3.1* 3.4* 3.6  CL  --  106 105  CO2 21* 23 23  GLUCOSE 96 93 100*  BUN 5.6* 6 <5*  CREATININE 0.8 0.64 0.68  CALCIUM 10.2 9.2 9.2   Liver Function Tests:  Recent Labs Lab  12/29/14 1529  AST 16  ALT 9  ALKPHOS 82  BILITOT 0.49  PROT 7.4  ALBUMIN 3.3*   No results for input(s): LIPASE, AMYLASE in the last 168 hours. No results for input(s): AMMONIA in the last 168 hours. CBC:  Recent Labs Lab 12/29/14 1529 12/30/14 0405  WBC 5.7 6.3  NEUTROABS 3.4  --   HGB 12.9 11.2*  HCT 37.2 32.7*  MCV 94.3 95.1  PLT 385 355   Cardiac Enzymes: No results for input(s): CKTOTAL, CKMB, CKMBINDEX, TROPONINI in the last 168 hours. BNP (last 3 results) No results for input(s): BNP in the last 8760 hours.  ProBNP (last 3 results) No results for input(s): PROBNP in the last 8760 hours.  CBG: No results for input(s): GLUCAP in the last 168 hours.  No results found for this or any previous visit (from the past 240 hour(s)).   Studies: No results found.  Scheduled Meds:  Scheduled Meds: . enoxaparin (LOVENOX) injection  40 mg Subcutaneous Q24H  . feeding supplement (ENSURE ENLIVE)  237 mL Oral TID BM  . nicotine  14 mg Transdermal Daily  . OxyCODONE  20 mg Oral Q12H  . senna-docusate  2 tablet Oral QHS   Continuous Infusions:    Time spent on care of this patient: 25 min   [place], MD 01/02/2015, 11:28 AM  LOS: 4 days   [facility] Office  [phone] Pager - Text Page per [email or web address] If 7PM-7AM, please contact night-coverage [email or web address]

## 2015-01-03 NOTE — Progress Notes (Signed)
[facility] Progress Note   RAVONDA BRECHEEN  EEF:[id number]  DOB: 18-Aug-1970  DOA: 12/29/2014 PCP: [name], MD  Brief narrative: Andrea [name] is a 44 y.o. 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 Earlie Server due to severe left hip pain radiating to the thigh and calf and limiting ambulation. History from chart reveals that she recently had complaints of lumbar pain and underwent imaging which showed a left iliac mass and underwent a biopsy on 12/21/14 which was positive for above-mentioned epistatic adenocarcinoma. 9/6 was her initial visit with Dr. Julien Nordmann. She was prescribed OxyContin for pain but this was $200 and was unable to afford this. She did have oxycodone and was taking anywhere from 15-35 mg every 4-6 hours but pain continued to be uncontrolled. She is admitted for pain management with radiation and further staging workup of cancer.   Subjective: No change in pain and left hip- no new complaints.   Assessment/Plan: Principal Problem: Metastasis of left hip region/   Uncontrolled pain -She is unable to afford OxyContin--unfortunately there is no financial assistance that can be given with this medication-I have discussed that she will need to take oxycodone's that he already has at home to help control her pain -Continue oxycodone and IV Dilaudid for breakthrough pain -Radiation oncology consulted and radiation started 9/8  Active Problems:    Lung cancer -Biopsy of left hip lesion reveals Non-small cell adenocarcinoma stage IV -Imaging reveals right hilar, subcarinal adenopathy and left iliac chain adenopathy in addition to the above-mentioned left posterior iliac bone met -MRI of the brain negative for metastasis -Has been evaluated by Dr. Earlie Server- awaiting EGFR mutation status for deciding on chemotherapy  Severe  constipation -States that she has been "holding"her stool for about a month now as it is painful to sit on the toilet -was able to have a BM with Dulcolax- cont Colace and Senna daily as she is on narcotics- stools now regular    Hypokalemia -Replacing   Smoker -Continue nicotine patch-counseled to stop smoking  Code Status:     Code Status Orders        Start     Ordered   12/29/14 1805  Full code   Continuous     12/29/14 1806     Family Communication:   Disposition Plan: Home when pain better controlled- likely after radiation on Monday DVT prophylaxis: Lovenox Consultants: Medical Oncology, radiation oncology Procedures:  Antibiotics: Anti-infectives    None      Objective: Filed Weights   01/01/15 0452  Weight: 54.885 kg (121 lb)    Intake/Output Summary (Last 24 hours) at 01/03/15 1047 Last data filed at 01/03/15 0501  Gross per 24 hour  Intake   1245 ml  Output    200 ml  Net   1045 ml     Vitals Filed Vitals:   01/02/15 0529 01/02/15 1330 01/02/15 2206 01/03/15 0448  BP: 90/68 102/60 110/60 94/70  Pulse: 75 96 88 75  Temp: 98 F (36.7 C) 98.5 F (36.9 C) 98.1 F (36.7 C) 97.9 F (36.6 C)  TempSrc: Oral Oral Oral Oral  Resp: 14 18 14 16   Height:      Weight:      SpO2: 100% 96% 96% 98%    Exam:  General:  Pt is alert, not in acute distress  HEENT: No icterus, No thrush, oral mucosa moist  Cardiovascular: regular rate and rhythm, S1/S2 No murmur  Respiratory: clear to auscultation bilaterally   Abdomen: Soft, +Bowel sounds, non tender, non distended, no guarding  MSK: No LE edema, cyanosis or clubbing- swelling in left iliac area with tenderness  Data Reviewed: Basic Metabolic Panel:  Recent Labs Lab 12/29/14 1529 12/30/14 0405 12/31/14 0400  NA 141 136 136  K 3.1* 3.4* 3.6  CL  --  106 105  CO2 21* 23 23  GLUCOSE 96 93 100*  BUN 5.6* 6 <5*  CREATININE 0.8 0.64 0.68  CALCIUM 10.2 9.2 9.2   Liver Function  Tests:  Recent Labs Lab 12/29/14 1529  AST 16  ALT 9  ALKPHOS 82  BILITOT 0.49  PROT 7.4  ALBUMIN 3.3*   No results for input(s): LIPASE, AMYLASE in the last 168 hours. No results for input(s): AMMONIA in the last 168 hours. CBC:  Recent Labs Lab 12/29/14 1529 12/30/14 0405  WBC 5.7 6.3  NEUTROABS 3.4  --   HGB 12.9 11.2*  HCT 37.2 32.7*  MCV 94.3 95.1  PLT 385 355   Cardiac Enzymes: No results for input(s): CKTOTAL, CKMB, CKMBINDEX, TROPONINI in the last 168 hours. BNP (last 3 results) No results for input(s): BNP in the last 8760 hours.  ProBNP (last 3 results) No results for input(s): PROBNP in the last 8760 hours.  CBG: No results for input(s): GLUCAP in the last 168 hours.  No results found for this or any previous visit (from the past 240 hour(s)).   Studies: No results found.  Scheduled Meds:  Scheduled Meds: . enoxaparin (LOVENOX) injection  40 mg Subcutaneous Q24H  . feeding supplement (ENSURE ENLIVE)  237 mL Oral TID BM  . nicotine  14 mg Transdermal Daily  . OxyCODONE  20 mg Oral Q12H  . senna-docusate  2 tablet Oral QHS   Continuous Infusions:    Time spent on care of this patient: 25 min   [place], MD 01/03/2015, 10:47 AM  LOS: 5 days   [facility] Office  [phone] Pager - Text Page per [email or web address] If 7PM-7AM, please contact night-coverage [email or web address]

## 2015-01-04 ENCOUNTER — Ambulatory Visit: Payer: No Typology Code available for payment source

## 2015-01-04 ENCOUNTER — Ambulatory Visit
Admit: 2015-01-04 | Discharge: 2015-01-04 | Disposition: A | Discharge status: Home / Self Care | Payer: Medicaid Other | Attending: Radiation Oncology | Admitting: Radiation Oncology

## 2015-01-04 ENCOUNTER — Encounter: Payer: No Typology Code available for payment source | Admitting: Internal Medicine

## 2015-01-04 DIAGNOSIS — C7951 Secondary malignant neoplasm of bone: Secondary | ICD-10-CM

## 2015-01-04 MED ORDER — POLYETHYLENE GLYCOL 3350 17 G PO PACK: 17.0000 g | PACK | Freq: Every day | ORAL | Status: DC | PRN

## 2015-01-04 MED ORDER — BISACODYL 10 MG RE SUPP: 10.0000 mg | Freq: Every day | RECTAL | Status: DC | PRN

## 2015-01-04 MED ORDER — OXYCODONE HCL 5 MG PO TABS: 5.0000 mg | ORAL_TABLET | ORAL | Status: DC | PRN

## 2015-01-04 MED ORDER — MEDROXYPROGESTERONE ACETATE 150 MG/ML IM SUSP
150.0000 mg | Freq: Once | INTRAMUSCULAR | Status: AC
  Administered 2015-01-04: 150 mg via INTRAMUSCULAR
  Filled 2015-01-04: qty 1

## 2015-01-04 MED ORDER — ENSURE ENLIVE PO LIQD: 237.0000 mL | Freq: Three times a day (TID) | ORAL | Status: DC

## 2015-01-04 MED ORDER — OXYCODONE HCL 10 MG PO TABS: 10.0000 mg | ORAL_TABLET | ORAL | Status: DC | PRN

## 2015-01-04 MED ORDER — SENNOSIDES-DOCUSATE SODIUM 8.6-50 MG PO TABS: 2.0000 | ORAL_TABLET | Freq: Every day | ORAL | Status: DC

## 2015-01-04 NOTE — Progress Notes (Signed)
This CM met with pt at bedside. Pt expressed need for BSC at home at [place]. Pt is without insurance and states she has just received word that her disability has gone through and she should be getting Medicaid soon. Pt states the only money that she has is for her pain medicine when she discharges. A BSC through [facility] if no insurance would be $39. Pt states she does not have money for that. This CM called the SW Lauren at the [facility] to see if there are resources for a [facility]. Lauren checked her supply of donated DME and a BSC was not available. The pt was then given the phone # to [place] [facility] who have DME for cancer pts without resources to pay for them. Pt was also encouraged to see the financial advocate at the [facility] at her next appointment to apply for funding for her medications. She was instructed to bring her disability letter or a letter from the person she is living with to vouch for her not having an income. Pt states she will do this and was appreciative of CM assistance. Marney Doctor RN,BSN,NCM [phone]

## 2015-01-04 NOTE — Discharge Summary (Signed)
Physician Discharge Summary  BEAUTIFUL PENSYL BVQ:[id number] DOB: 19-Aug-1970 DOA: 12/29/2014  PCP: [name], MD  Admit date: 12/29/2014 Discharge date: 01/04/2015  Time spent: 45 minutes   Discharge Condition: stable Diet recommendation: regular diet  Discharge Diagnoses:  Principal Problem:   Mass of left hip region Active Problems:   Uncontrolled pain   Hypokalemia   Bone metastases   Non-small cell carcinoma of lung Constipation  History of present illness:  Andrea [name] is a 44 y.o. 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 Earlie Server due to severe left hip pain radiating to the thigh and calf and limiting ambulation. History from chart reveals that she recently had complaints of lumbar pain and underwent imaging which showed a left iliac mass and underwent a biopsy on 12/21/14 which was positive for above-mentioned epistatic adenocarcinoma. 9/6 was her initial visit with Dr. Julien Nordmann. She was prescribed OxyContin for pain but this was $200 and was unable to afford this. She did have oxycodone and was taking anywhere from 15-35 mg every 4-6 hours but pain continued to be uncontrolled. She is admitted for pain management with radiation and further staging workup of cancer.  Hospital Course:  Principal Problem: Metastasis of left hip region/ Uncontrolled pain -She is unable to afford OxyContin--unfortunately there is no financial assistance that can be given with this medication-I have discussed that she will need to take oxycodone's that he already has at home to help control her pain -Continue oxycodone at home- she already has 2 bottles of  5 and 10 mgs -Radiation oncology consulted and radiation started 9/8- today was her 3rd treatment - bedside commode ordered for home   Active Problems:   Lung cancer -Biopsy of left hip lesion reveals  Non-small cell adenocarcinoma stage IV -Imaging reveals right hilar, subcarinal adenopathy and left iliac chain adenopathy in addition to the above-mentioned left posterior iliac bone met -MRI of the brain negative for metastasis -Has been evaluated by Dr. Earlie Server- awaiting EGFR mutation status for deciding on chemotherapy  Severe constipation -States that she has been "holding"her stool for about a month now as it is painful to sit on the toilet -was able to have regular BMs with laxatives- cont Colace and Senna daily as she is on narcotics- stools now regular   Hypokalemia -Replaced  Smoker -has declined prescription for nicotine patch-counseled to stop smoking  NOTE: she asked for Depo provera shot prior to leaving- called her office and spoke with her nurse to obtain dose and ordered   Procedures:  Radiation   Consultations:  Radiation and medical oncology  Discharge Exam: Filed Weights   01/01/15 0452  Weight: 54.885 kg (121 lb)   Filed Vitals:   01/04/15 0603  BP: 87/53  Pulse: 80  Temp: 98.2 F (36.8 C)  Resp: 18    General: AAO x 3, no distress Cardiovascular: RRR, no murmurs  Respiratory: clear to auscultation bilaterally GI: soft, non-tender, non-distended, bowel sound positive  Discharge Instructions You were cared for by a hospitalist during your hospital stay. If you have any questions about your discharge medications or the care you received while you were in the hospital after you are discharged, you can call the unit and asked to speak with the hospitalist on call if the hospitalist that took care of you is not available. Once you are discharged, your primary care physician will handle any  further medical issues. Please note that NO REFILLS for any discharge medications will be authorized once you are discharged, as it is imperative that you return to your primary care physician (or establish a relationship with a primary care physician if you do not  have one) for your aftercare needs so that they can reassess your need for medications and monitor your lab values.      Discharge Instructions    Discharge instructions    Complete by:  As directed   Regular diet     Increase activity slowly    Complete by:  As directed             Medication List    TAKE these medications        bisacodyl 10 MG suppository  Commonly known as:  DULCOLAX  Place 1 suppository (10 mg total) rectally daily as needed for moderate constipation.     cyanocobalamin 500 MCG tablet  Take 500 mcg by mouth daily.     feeding supplement (ENSURE ENLIVE) Liqd  Take 237 mLs by mouth 3 (three) times daily between meals.     MULTIVITAMIN & MINERAL PO  Take 2 tablets by mouth daily.     oxyCODONE 5 MG immediate release tablet  Commonly known as:  ROXICODONE  Take 1 tablet (5 mg total) by mouth every 4 (four) hours as needed for moderate pain.     Oxycodone HCl 10 MG Tabs  Take 1 tablet (10 mg total) by mouth every 4 (four) hours as needed (severe pain).     polyethylene glycol packet  Commonly known as:  MIRALAX / GLYCOLAX  Take 17 g by mouth daily as needed for mild constipation.     PRENATAL VITAMIN PO  Take 1 tablet by mouth daily.     senna-docusate 8.6-50 MG per tablet  Commonly known as:  Senokot-S  Take 2 tablets by mouth at bedtime.       Allergies  Allergen Reactions  . Morphine And Related Rash    Patient states that if she takes more than 30 days she gets rashes.  . Hydrocodone Rash      The results of significant diagnostics from this hospitalization (including imaging, microbiology, ancillary and laboratory) are listed below for reference.    Significant Diagnostic Studies: Ct Chest W Contrast  12/29/2014   CLINICAL DATA:  45 year old female with recent diagnosis of metastatic adenocarcinoma of the lung. Left iliac mass all. Patient presenting with severe left hip pain radiating to the left calf and thigh.  EXAM: CT CHEST,  ABDOMEN, AND PELVIS WITH CONTRAST  TECHNIQUE: Multidetector CT imaging of the chest, abdomen and pelvis was performed following the standard protocol during bolus administration of intravenous contrast.  CONTRAST:  34m OMNIPAQUE IOHEXOL 300 MG/ML SOLN, 1070mOMNIPAQUE IOHEXOL 300 MG/ML SOLN  COMPARISON:  CT dated 12/21/2014 and MRI dated 12/14/2014.  FINDINGS: CT CHEST FINDINGS  Mild centrilobular emphysema. There is no focal consolidation, pleural effusion, or pneumothorax. Low attenuating nodular soft tissue densities in the right hilar region and surrounding the right lower lobe pulmonary artery most compatible with enlarged lymph nodes. The largest measures up to 1.5 cm in diameter. A 1.7 x 1.1 cm subcarinal adenopathy noted. low attenuation within these lymph nodes likely represent necrotic changes.The central airways are patent.  The thoracic aorta and pulmonary arteries are unremarkable. No cardiomegaly or pericardial effusion. The thyroid gland is unremarkable. The esophagus is collapsed.  There is no axillary adenopathy. The chest wall  and soft tissues appear unremarkable. The osseous structures of the thorax are intact. No acute fracture or osseous lesion is identified.  CT ABDOMEN AND PELVIS FINDINGS  No intra-abdominal free air.  Trace free fluid within the pelvis.  Subcentimeter right hepatic hypodense lesion is too small to characterize. The liver is otherwise unremarkable. The gallbladder, the pancreas, spleen, adrenal glands appear unremarkable. A 1 cm right renal hypodense lesion likely represents a cyst. Subcentimeter left renal hypodense lesions are too small to characterize but likely represent cysts. Ultrasound is recommended for further characterization is of the kidneys. There is no hydronephrosis on either side. The visualized ureters and urinary bladder appear unremarkable. The uterus and ovaries are grossly unremarkable.  There is no evidence of bowel obstruction or inflammation. Normal  appendix. The abdominal aorta and IVC appear unremarkable. The origins of the celiac axis, SMA, IMA as well as the origins of the renal arteries are patent. No portal venous gas identified. Left iliac chain adenopathy noted.  A 3.8 x 3.8 cm soft tissue mass is noted in the left posterior iliac bone which appears slightly the gait compared to the prior study. There is apparent extension of the lesion into the left SI joint. There is associated destruction of the bone. Wake it is a there is degenerative changes at L5-S1.  IMPRESSION: Right hilar and subcarinal adenopathy.  Left posterior iliac bone known soft tissue mass most compatible with malignancy. This lesion appears slightly larger compared to the prior study and extends into the left SI joint.  Left iliac chain adenopathy.   Electronically Signed   By: Anner Crete M.D.   On: 12/29/2014 23:34   Mr Jeri Cos QI Contrast  12/30/2014   CLINICAL DATA:  Initial evaluation for possible metastatic disease. Recently diagnosed lung cancer  EXAM: MRI HEAD WITHOUT AND WITH CONTRAST  TECHNIQUE: Multiplanar, multiecho pulse sequences of the brain and surrounding structures were obtained without and with intravenous contrast.  CONTRAST:  80m MULTIHANCE GADOBENATE DIMEGLUMINE 529 MG/ML IV SOLN  COMPARISON:  None.  FINDINGS: The CSF containing spaces are within normal limits for patient age. No focal parenchymal signal abnormality is identified. No mass lesion, midline shift, or extra-axial fluid collection. Ventricles are normal in size without evidence of hydrocephalus.  No diffusion-weighted signal abnormality is identified to suggest acute intracranial infarct. Gray-white matter differentiation is maintained. Normal flow voids are seen within the intracranial vasculature. No intracranial hemorrhage identified.  The cervicomedullary junction is normal. Pituitary gland is within normal limits. Pituitary stalk is midline. The globes and optic nerves demonstrate a normal  appearance with normal signal intensity. 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 No abnormal enhancement within the brain itself. Tiny focus of nodular enhancement within the left frontal lobe favored to be vascular nature (series 13, image 43).  The bone marrow signal intensity is normal. Calvarium is intact. Visualized upper cervical spine is within normal limits.  Scalp soft tissues are unremarkable.  Mild scattered opacity within the frontal sinuses and anterior ethmoidal air cells. Paranasal sinuses are otherwise clear. No mastoid effusion.  IMPRESSION: Normal brain MRI with no acute intracranial process identified. No evidence for intracranial metastasis.   Electronically Signed   By: BJeannine BogaM.D.   On: 12/30/2014 01:44   Mr Pelvis W Wo Contrast  12/14/2014   CLINICAL DATA:  Previous back surgery.  Extreme pain.  EXAM: MRI PELVIS WITHOUT AND WITH CONTRAST  TECHNIQUE: Multiplanar multisequence MR imaging of the  pelvis was performed both before and after administration of intravenous contrast.  CONTRAST:  46m MULTIHANCE GADOBENATE DIMEGLUMINE 529 MG/ML IV SOLN  COMPARISON:  None.  FINDINGS: Bone  There is 2.7 x 2.9 x 4 cm expansile bone lesion in the left posterior ilium with avid enhancement on post-contrast imaging. There is adjacent reactive marrow edema in the ilium. There is soft tissue edema in the adjacent left paraspinal muscle.  There is no hip fracture, dislocation or avascular necrosis.  There is severe degenerative disc disease with disc height loss at L5-S1 with a broad-based disc bulge and bilateral facet arthropathy.  Alignment  Normal. No subluxation.  Dysplasia  None.  Joint effusion  None.  Labrum  Normal. No labral tear.  Cartilage  Femoral cartilage: Normal.  Acetabular cartilage: Normal.  Capsule and ligaments  Normal.  Muscles and Tendons  Flexors: Normal.  Extensors: Normal.  Abductors: Normal.  Adductors: Normal.  Rotators: Normal.   Hamstrings: Normal.  Other Findings  None  Viscera  Normal. No abnormality seen in pelvis. No lymphadenopathy. No free fluid in the pelvis.  IMPRESSION: 1. Mildly expansile 2.7 x 2.9 x 4 cm mass in the left posterior ilium with avid enhancement most concerning for malignancy with adjacent muscle edema in the left paraspinal muscle. This may represent metastatic disease versus multiple myeloma versus postsurgical changes or infection given that the patient has had prior intervention along the left side at L5-S1. Correlate with laboratory values.   Electronically Signed   By: HKathreen Devoid  On: 12/14/2014 11:23   Ct Abdomen Pelvis W Contrast  12/29/2014   CLINICAL DATA:  44year old female with recent diagnosis of metastatic adenocarcinoma of the lung. Left iliac mass all. Patient presenting with severe left hip pain radiating to the left calf and thigh.  EXAM: CT CHEST, ABDOMEN, AND PELVIS WITH CONTRAST  TECHNIQUE: Multidetector CT imaging of the chest, abdomen and pelvis was performed following the standard protocol during bolus administration of intravenous contrast.  CONTRAST:  53mOMNIPAQUE IOHEXOL 300 MG/ML SOLN, 10078mMNIPAQUE IOHEXOL 300 MG/ML SOLN  COMPARISON:  CT dated 12/21/2014 and MRI dated 12/14/2014.  FINDINGS: CT CHEST FINDINGS  Mild centrilobular emphysema. There is no focal consolidation, pleural effusion, or pneumothorax. Low attenuating nodular soft tissue densities in the right hilar region and surrounding the right lower lobe pulmonary artery most compatible with enlarged lymph nodes. The largest measures up to 1.5 cm in diameter. A 1.7 x 1.1 cm subcarinal adenopathy noted. low attenuation within these lymph nodes likely represent necrotic changes.The central airways are patent.  The thoracic aorta and pulmonary arteries are unremarkable. No cardiomegaly or pericardial effusion. The thyroid gland is unremarkable. The esophagus is collapsed.  There is no axillary adenopathy. The chest wall and  soft tissues appear unremarkable. The osseous structures of the thorax are intact. No acute fracture or osseous lesion is identified.  CT ABDOMEN AND PELVIS FINDINGS  No intra-abdominal free air.  Trace free fluid within the pelvis.  Subcentimeter right hepatic hypodense lesion is too small to characterize. The liver is otherwise unremarkable. The gallbladder, the pancreas, spleen, adrenal glands appear unremarkable. A 1 cm right renal hypodense lesion likely represents a cyst. Subcentimeter left renal hypodense lesions are too small to characterize but likely represent cysts. Ultrasound is recommended for further characterization is of the kidneys. There is no hydronephrosis on either side. The visualized ureters and urinary bladder appear unremarkable. The uterus and ovaries are grossly unremarkable.  There is no evidence of  bowel obstruction or inflammation. Normal appendix. The abdominal aorta and IVC appear unremarkable. The origins of the celiac axis, SMA, IMA as well as the origins of the renal arteries are patent. No portal venous gas identified. Left iliac chain adenopathy noted.  A 3.8 x 3.8 cm soft tissue mass is noted in the left posterior iliac bone which appears slightly the gait compared to the prior study. There is apparent extension of the lesion into the left SI joint. There is associated destruction of the bone. Wake it is a there is degenerative changes at L5-S1.  IMPRESSION: Right hilar and subcarinal adenopathy.  Left posterior iliac bone known soft tissue mass most compatible with malignancy. This lesion appears slightly larger compared to the prior study and extends into the left SI joint.  Left iliac chain adenopathy.   Electronically Signed   By: Anner Crete M.D.   On: 12/29/2014 23:34   US Renal  12/14/2014   CLINICAL DATA:  Renal lesions on MRI.  EXAM: RENAL / URINARY TRACT ULTRASOUND COMPLETE  COMPARISON:  None.  FINDINGS: Right Kidney:  Length: 11.0 cm. Echogenicity within  normal limits. No hydronephrosis visualized. 1.2 cm simple cyst.  Left Kidney:  Length: 10.7 cm. Echogenicity within normal limits. No hydronephrosis visualized. 1.0 cm simple cyst .  Bladder:  Appears normal for degree of bladder distention.  IMPRESSION: Bilateral simple renal cysts attending for MRI abnormality, exam otherwise normal. No significant renal lesions identified.   Electronically Signed   By: Marcello Moores  Register   On: 12/14/2014 11:30   Ct Biopsy  12/21/2014   CLINICAL DATA:  Painful destructive lesion in the left iliac bone near the SI joint. Previous lumbar disc surgery.  EXAM: CT GUIDED CORE BIOPSY OF LEFT ILIUM LESION  ANESTHESIA/SEDATION: Intravenous Fentanyl and Versed were administered as conscious sedation during continuous cardiorespiratory monitoring by the radiology RN, with a total moderate sedation time of 11 minutes.  PROCEDURE: The procedure risks, benefits, and alternatives were explained to the patient. Questions regarding the procedure were encouraged and answered. The patient understands and consents to the procedure.  Patient placed prone. Select axial scans through the pelvis obtained. The lytic left ilium lesion was localized and an appropriate skin entry site was determined and marked.  The operative field was prepped with chlorhexidinein a sterile fashion, and a sterile drape was applied covering the operative field. A sterile gown and sterile gloves were used for the procedure. Local anesthesia was provided with 1% Lidocaine.  Under CT fluoroscopic guidance, a 17 gauge trocar needle was advanced to the margin of the lesion. Once needle tip position was confirmed, coaxial 18-gauge core biopsy samples were obtained, submitted in formalin to surgical pathology, and in saline to microbiology. The guide needle was removed. Postprocedure scans show no hematoma or other apparent complication. The patient tolerated the procedure well.  COMPLICATIONS: None immediate  FINDINGS: Lytic  lesion in the left ilium at the posterior margin of the SI joint, with cortical destruction. Core biopsy samples were obtained, sent for surgical path and microbiology results.  IMPRESSION: 1. Technically successful CT-guided core biopsy of left ilium bone lesion.   Electronically Signed   By: Lucrezia Europe M.D.   On: 12/21/2014 11:09   Dg Hip Unilat With Pelvis 2-3 Views Left  12/29/2014   CLINICAL DATA:  Uncontrollable left hip pain. History of back pain radiating to the left leg. Recent diagnosis of metastatic lung cancer and left ilium mass.  EXAM: DG HIP (WITH OR WITHOUT  PELVIS) 2-3V LEFT  COMPARISON:  CT abdomen and pelvis 12/29/2014  FINDINGS: Examination is technically limited due to patient rotation hand due to contrast material in overlying bowel hand bladder which limits evaluation of the pelvis. There is no evidence of acute fracture or dislocation of the left hip. Mild degenerative changes are present. The known bone lesion in the left ilium is not well delineated. Visualize soft tissues are unremarkable.  IMPRESSION: Limited study due to residual contrast material obscuring visualization. No evidence of acute fracture or dislocation in the left hip.   Electronically Signed   By: Lucienne Capers M.D.   On: 12/29/2014 23:08    Microbiology: No results found for this or any previous visit (from the past 240 hour(s)).   Labs: Basic Metabolic Panel:  Recent Labs Lab 12/29/14 1529 12/30/14 0405 12/31/14 0400  NA 141 136 136  K 3.1* 3.4* 3.6  CL  --  106 105  CO2 21* 23 23  GLUCOSE 96 93 100*  BUN 5.6* 6 <5*  CREATININE 0.8 0.64 0.68  CALCIUM 10.2 9.2 9.2   Liver Function Tests:  Recent Labs Lab 12/29/14 1529  AST 16  ALT 9  ALKPHOS 82  BILITOT 0.49  PROT 7.4  ALBUMIN 3.3*   No results for input(s): LIPASE, AMYLASE in the last 168 hours. No results for input(s): AMMONIA in the last 168 hours. CBC:  Recent Labs Lab 12/29/14 1529 12/30/14 0405  WBC 5.7 6.3  NEUTROABS  3.4  --   HGB 12.9 11.2*  HCT 37.2 32.7*  MCV 94.3 95.1  PLT 385 355   Cardiac Enzymes: No results for input(s): CKTOTAL, CKMB, CKMBINDEX, TROPONINI in the last 168 hours. BNP: BNP (last 3 results) No results for input(s): BNP in the last 8760 hours.  ProBNP (last 3 results) No results for input(s): PROBNP in the last 8760 hours.  CBG: No results for input(s): GLUCAP in the last 168 hours.     SignedDebbe Odea, MD [facility] 01/04/2015, 12:00 PM

## 2015-01-04 NOTE — Progress Notes (Signed)
Weekly Management Note:  Site: Left iliac bone Current Dose:  500  cGy Projected Dose: 3500  cGy  Narrative: The patient is seen today for routine under treatment assessment. CBCT/MVCT images/port films were reviewed. The chart was reviewed.   She states that her pain is slightly improved and is currently 4-5/10.  She is being discharged this evening.  She still has quite a bit of pain when trying to squat to use the toilet.  Physical Examination: There were no vitals filed for this visit..  Weight:  .  No change.  Impression: Tolerating radiation therapy well.  Plan: Continue radiation therapy as planned.

## 2015-01-04 NOTE — Progress Notes (Signed)
Discharge instructions explained to pt. Instructed pt to contact Dr. Julien Nordmann for refills on her pain meds per Dr. Wynelle Cleveland. Discharged to mother via wheelchair. Pt has appointment 9/13 at [facility].

## 2015-01-05 ENCOUNTER — Ambulatory Visit: Payer: No Typology Code available for payment source

## 2015-01-05 ENCOUNTER — Encounter: Payer: Self-pay | Admitting: *Deleted

## 2015-01-05 ENCOUNTER — Ambulatory Visit
Admit: 2015-01-05 | Discharge: 2015-01-05 | Disposition: A | Discharge status: Home / Self Care | Payer: Medicaid Other | Attending: Radiation Oncology | Admitting: Radiation Oncology

## 2015-01-05 ENCOUNTER — Telehealth: Payer: Self-pay | Admitting: Internal Medicine

## 2015-01-05 DIAGNOSIS — Z510 Encounter for antineoplastic radiation therapy: Secondary | ICD-10-CM | POA: Diagnosis present

## 2015-01-05 DIAGNOSIS — Z79899 Other long term (current) drug therapy: Secondary | ICD-10-CM | POA: Diagnosis not present

## 2015-01-05 DIAGNOSIS — R531 Weakness: Secondary | ICD-10-CM | POA: Diagnosis not present

## 2015-01-05 DIAGNOSIS — C3490 Malignant neoplasm of unspecified part of unspecified bronchus or lung: Secondary | ICD-10-CM

## 2015-01-05 DIAGNOSIS — R52 Pain, unspecified: Secondary | ICD-10-CM | POA: Diagnosis not present

## 2015-01-05 DIAGNOSIS — Z885 Allergy status to narcotic agent status: Secondary | ICD-10-CM | POA: Diagnosis not present

## 2015-01-05 DIAGNOSIS — F17210 Nicotine dependence, cigarettes, uncomplicated: Secondary | ICD-10-CM | POA: Diagnosis not present

## 2015-01-05 DIAGNOSIS — C7951 Secondary malignant neoplasm of bone: Secondary | ICD-10-CM | POA: Diagnosis not present

## 2015-01-05 NOTE — Progress Notes (Signed)
Oncology Nurse Navigator Documentation  Oncology Nurse Navigator Flowsheets 01/05/2015  Navigator Encounter Type Other/I noticed patient is out of the hospital.  I discussed with Dr. Julien Nordmann.  He would like patient to be scheduled on 01/25/15 at 3:45. I notified scheduling and Tiffany regarding appt  Treatment Phase Treatment  Interventions Coordination of Care  Coordination of Care MD Appointments  Time Spent with Patient 30

## 2015-01-05 NOTE — Telephone Encounter (Signed)
lvm for pt regarding to OCT appt..... °

## 2015-01-06 ENCOUNTER — Ambulatory Visit: Payer: Medicaid Other

## 2015-01-06 ENCOUNTER — Ambulatory Visit
Admission: RE | Admit: 2015-01-06 | Discharge: 2015-01-06 | Disposition: A | Discharge status: Home / Self Care | Payer: Medicaid Other | Source: Ambulatory Visit | Attending: Radiation Oncology | Admitting: Radiation Oncology

## 2015-01-07 ENCOUNTER — Encounter: Payer: Self-pay | Admitting: *Deleted

## 2015-01-07 ENCOUNTER — Telehealth: Payer: Self-pay | Admitting: *Deleted

## 2015-01-07 ENCOUNTER — Ambulatory Visit
Admit: 2015-01-07 | Discharge: 2015-01-07 | Disposition: A | Discharge status: Home / Self Care | Payer: Medicaid Other | Attending: Radiation Oncology | Admitting: Radiation Oncology

## 2015-01-07 DIAGNOSIS — Z510 Encounter for antineoplastic radiation therapy: Secondary | ICD-10-CM | POA: Diagnosis not present

## 2015-01-07 NOTE — Progress Notes (Signed)
I called FA in [place] today.  I left her a vm message regarding patient's needs.  I asked that she call me if needed for more information.

## 2015-01-07 NOTE — Progress Notes (Addendum)
Ms. Andrea [name] in clinic today with c/o of increasing pain unrelieved with oxycodone 5 mg tabs, 240 pills, which were prescribed on 01/04/15.  She states that she is taking 5 pills ('50mg'$ ) at a time instead of taking 1 tab every 4 hours as is written on her prescription.   She has been instructed to obtain her medications from her prior physician Debbe Odea, MD.  She was encouraged to go to the ED today, but it was very crowded at [facility] and at [place] due to high census, therefore she decided to go home.

## 2015-01-07 NOTE — Telephone Encounter (Signed)
Oncology Nurse Navigator Documentation  Oncology Nurse Navigator Flowsheets 01/07/2015  Navigator Encounter Type Telephone/I received a vm message regarding patient does not feel safe where she is staying.  I called CSW to update and left her a vm message.  I called patient back.  I left vm message stating that I had notified CSW and also stated she should think about moving in with her mother.  I asked that she call cancer center and speak with either CSW or myself.    Patient Visit Type Follow-up  Treatment Phase -  Interventions Other;Referrals  Coordination of Care -  Time Spent with Patient 30

## 2015-01-07 NOTE — Progress Notes (Addendum)
CSW received referral from Norton Blizzard, thoracic navigator.  CSW attempted to contact patient and left VM requesting patient return call as soon as possible.  Patient returned CSW voicemail today.  Ms. Disla prefers to be called "Andrea [name]".  She shared that she is living with a roommate and they are currently having a "falling out".  The patient is unaware why the roommate is so angry, but the roommate is refusing to give the patient her mail.  Andrea [name] shared the roommate has mail stating she has been approved for SSI from [employer].  The patient was under the impression that she would automatically be approved for Medicaid- CSW requested she contact [place] to verify that she does not need to complete application for Medicaid or discuss with [facility] financial advocates.  The patient reported her main concern was housing and managing her pain.  She stated living with her mother is not an option due to relationship conflicts.  CSW explained lack of housing resources, patient is not interested in a shelter at this time and plans to explore "staying at a friends house" temporarily.  Regarding her pain, the patient shared she is unsure who manages her pain (medical oncologist or radiation oncologist), CSW shared she is unsure who would be responsible for pain medication and encouraged her to discuss with her physicians.    CSW will follow up with patient next week to explore other possible resources. CSW notified Norton Blizzard, thoracic navigator, of patient conversation.  Polo Riley, MSW, LCSW, OSW-C Clinical Social Worker [facility] [phone]

## 2015-01-07 NOTE — Telephone Encounter (Signed)
Oncology Nurse Navigator Documentation  Oncology Nurse Navigator Flowsheets 01/07/2015  Navigator Encounter Type Telephone/Patient complained of continued pain.  I spoke with Dr. Julien Nordmann about patient's pain and her medications.  He stated she can go to the hospital for evaluation and pain relief.  I called and spoke with patient about Dr. Worthy Flank plan of care.  She verbalized understanding.    Patient Visit Type Follow-up  Treatment Phase -  Interventions Other  Coordination of Care -  Time Spent with Patient 15

## 2015-01-08 ENCOUNTER — Ambulatory Visit
Admit: 2015-01-08 | Discharge: 2015-01-08 | Disposition: A | Discharge status: Home / Self Care | Payer: Medicaid Other | Attending: Radiation Oncology | Admitting: Radiation Oncology

## 2015-01-08 DIAGNOSIS — Z510 Encounter for antineoplastic radiation therapy: Secondary | ICD-10-CM | POA: Diagnosis not present

## 2015-01-11 ENCOUNTER — Other Ambulatory Visit: Payer: Self-pay | Admitting: Internal Medicine

## 2015-01-11 ENCOUNTER — Other Ambulatory Visit: Payer: Self-pay | Admitting: *Deleted

## 2015-01-11 ENCOUNTER — Ambulatory Visit
Admission: RE | Admit: 2015-01-11 | Discharge: 2015-01-11 | Disposition: A | Discharge status: Home / Self Care | Payer: Medicaid Other | Source: Ambulatory Visit | Attending: Radiation Oncology | Admitting: Radiation Oncology

## 2015-01-11 ENCOUNTER — Ambulatory Visit
Admission: RE | Admit: 2015-01-11 | Discharge: 2015-01-11 | Disposition: A | Discharge status: Home / Self Care | Payer: No Typology Code available for payment source | Source: Ambulatory Visit | Attending: Radiation Oncology | Admitting: Radiation Oncology

## 2015-01-11 DIAGNOSIS — R9389 Abnormal findings on diagnostic imaging of other specified body structures: Secondary | ICD-10-CM

## 2015-01-11 DIAGNOSIS — C7951 Secondary malignant neoplasm of bone: Secondary | ICD-10-CM

## 2015-01-11 DIAGNOSIS — M5116 Intervertebral disc disorders with radiculopathy, lumbar region: Secondary | ICD-10-CM

## 2015-01-11 DIAGNOSIS — M898X9 Other specified disorders of bone, unspecified site: Secondary | ICD-10-CM

## 2015-01-11 DIAGNOSIS — Z510 Encounter for antineoplastic radiation therapy: Secondary | ICD-10-CM | POA: Diagnosis not present

## 2015-01-11 MED ORDER — OXYCODONE HCL 10 MG PO TABS: 10.0000 mg | ORAL_TABLET | ORAL | Status: DC | PRN

## 2015-01-11 NOTE — Progress Notes (Signed)
CC: Dr.    Chesley Mires Amed,  Dr. Curt Bears   Weekly Management Note:  Site: Left iliac bone Andrea [name] Current Dose:   1500  cGy Projected Dose:  3500  cGy  Narrative: The patient is seen today for routine under treatment assessment. CBCT/MVCT images/port films were reviewed. The chart was reviewed.    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 She continues to take up to 4-5   5 mg oxycodone tablets every 6-8  hours for pain control.  Her  Inpatient physicians did write her a prescription for OxyContin but she cannot this filled until she qualifies through [place] which is hopefully later this week.  She states that her pain is somewhat improved.  She will see Dr. Julien Nordmann  In early October after getting blood work on October 3.  She is expected to finish radiation therapy on Thursday, September 29.  Physical Examination: There were no vitals filed for this visit..  Weight:  .  No change in her palpable mass along the left iliac bone.  Impression: Tolerating radiation therapy well.  Hopefully she will get her OxyContin in the near future. I defer to her primary care physician, Dr. Earma Reading  for prescribing her narcotics.  We will not be actively managing the patient after next Thursday.  Plan: Continue radiation therapy as planned.

## 2015-01-11 NOTE — Telephone Encounter (Signed)
Message per pt - stated she has been out of pain medication since being discharge from the hospital last Monday. Stated was told by her Oncology MD to have Dr Genene Churn to refill her rxs. And Oxy '5mg'$  does not help w/her pain. I will send message to Dr Genene Churn. Thanks

## 2015-01-11 NOTE — Telephone Encounter (Signed)
Wrote for oxycodone '10mg'$  q4hr prn #90 tabs.  Still waiting for medicaid before she can fill her OxyContin, which will be helpful.

## 2015-01-12 ENCOUNTER — Ambulatory Visit
Admission: RE | Admit: 2015-01-12 | Discharge: 2015-01-12 | Disposition: A | Discharge status: Home / Self Care | Payer: Medicaid Other | Source: Ambulatory Visit | Attending: Radiation Oncology | Admitting: Radiation Oncology

## 2015-01-12 DIAGNOSIS — Z510 Encounter for antineoplastic radiation therapy: Secondary | ICD-10-CM | POA: Diagnosis not present

## 2015-01-13 ENCOUNTER — Telehealth: Payer: Self-pay | Admitting: *Deleted

## 2015-01-13 ENCOUNTER — Ambulatory Visit
Admission: RE | Admit: 2015-01-13 | Discharge: 2015-01-13 | Disposition: A | Discharge status: Home / Self Care | Payer: Medicaid Other | Source: Ambulatory Visit | Attending: Radiation Oncology | Admitting: Radiation Oncology

## 2015-01-13 DIAGNOSIS — Z510 Encounter for antineoplastic radiation therapy: Secondary | ICD-10-CM | POA: Diagnosis not present

## 2015-01-13 NOTE — Telephone Encounter (Signed)
Oncology Nurse Navigator Documentation  Oncology Nurse Navigator Flowsheets 01/13/2015  Navigator Encounter Type Telephone  Patient Visit Type Follow-up/called to check on patient.  She stated she was doing better. She still had some pain but was able to get around better.  I listened as she explained.  She did not express any needs or concerns at this time.   Treatment Phase -  Interventions Other  Coordination of Care -  Time Spent with Patient 15

## 2015-01-14 ENCOUNTER — Ambulatory Visit
Admission: RE | Admit: 2015-01-14 | Discharge: 2015-01-14 | Disposition: A | Discharge status: Home / Self Care | Payer: Medicaid Other | Source: Ambulatory Visit | Attending: Radiation Oncology | Admitting: Radiation Oncology

## 2015-01-14 DIAGNOSIS — Z510 Encounter for antineoplastic radiation therapy: Secondary | ICD-10-CM | POA: Diagnosis not present

## 2015-01-15 ENCOUNTER — Ambulatory Visit
Admission: RE | Admit: 2015-01-15 | Discharge: 2015-01-15 | Disposition: A | Discharge status: Home / Self Care | Payer: Medicaid Other | Source: Ambulatory Visit | Attending: Radiation Oncology | Admitting: Radiation Oncology

## 2015-01-15 DIAGNOSIS — Z510 Encounter for antineoplastic radiation therapy: Secondary | ICD-10-CM | POA: Diagnosis not present

## 2015-01-18 ENCOUNTER — Encounter: Payer: Self-pay | Admitting: Radiation Oncology

## 2015-01-18 ENCOUNTER — Ambulatory Visit
Admission: RE | Admit: 2015-01-18 | Discharge: 2015-01-18 | Disposition: A | Discharge status: Home / Self Care | Payer: No Typology Code available for payment source | Source: Ambulatory Visit | Attending: Radiation Oncology | Admitting: Radiation Oncology

## 2015-01-18 ENCOUNTER — Ambulatory Visit
Admission: RE | Admit: 2015-01-18 | Discharge: 2015-01-18 | Disposition: A | Discharge status: Home / Self Care | Payer: Medicaid Other | Source: Ambulatory Visit | Attending: Radiation Oncology | Admitting: Radiation Oncology

## 2015-01-18 VITALS — BP 92/64 | HR 109 | Temp 98.0°F | Ht 66.0 in | Wt 119.2 lb

## 2015-01-18 DIAGNOSIS — C7951 Secondary malignant neoplasm of bone: Secondary | ICD-10-CM

## 2015-01-18 DIAGNOSIS — Z510 Encounter for antineoplastic radiation therapy: Secondary | ICD-10-CM | POA: Diagnosis not present

## 2015-01-18 NOTE — Progress Notes (Signed)
Ms. Andrea [name] has received 11 fractions to her left iliac regionm. She grades her pain as a level 8/10 with reports of intermittent tingling and burning sensation in her left leg.  Unable to sit for prolonged periods, therefore, she arrives on a stretcher daily for tx.  Denies any constipation with her pain meds.

## 2015-01-18 NOTE — Progress Notes (Addendum)
   Weekly Management Note:Outpatient    ICD-9-CM ICD-10-CM   1. Bone metastases 198.5 C79.51     Current Dose:  27.5 Gy  Projected Dose: 35 Gy   Narrative:  The patient presents for routine under treatment assessment.  CBCT/MVCT images/Port film x-rays were reviewed.  The chart was checked. She grades her pain as a level 8/10 with reports of intermittent tingling and burning sensation in her left leg.  Unable to sit for prolonged periods, therefore, she arrives on a stretcher daily for tx.  Denies any constipation with her pain meds.  Physical Findings:  height is '5\' 6"'$  (1.676 m) and weight is 119 lb 3.2 oz (54.069 kg). Her temperature is 98 F (36.7 C). Her blood pressure is 92/64 and her pulse is 109.   Wt Readings from Last 3 Encounters:  01/18/15 119 lb 3.2 oz (54.069 kg)  01/01/15 121 lb (54.885 kg)  12/29/14 121 lb 1.6 oz (54.931 kg)   Hyperpigmented patches over left iliac region - scab at biopsy site  Impression:  The patient is tolerating radiotherapy.  Plan:  Continue radiotherapy as planned.  Aloe gel  over skin. Oxycodone PRN.  F/u in 73month ________________________________   SEppie Gibson M.D.

## 2015-01-19 ENCOUNTER — Ambulatory Visit
Admission: RE | Admit: 2015-01-19 | Discharge: 2015-01-19 | Disposition: A | Discharge status: Home / Self Care | Payer: Medicaid Other | Source: Ambulatory Visit | Attending: Radiation Oncology | Admitting: Radiation Oncology

## 2015-01-19 DIAGNOSIS — Z510 Encounter for antineoplastic radiation therapy: Secondary | ICD-10-CM | POA: Diagnosis not present

## 2015-01-20 ENCOUNTER — Ambulatory Visit: Payer: Medicaid Other

## 2015-01-20 ENCOUNTER — Ambulatory Visit
Admission: RE | Admit: 2015-01-20 | Discharge: 2015-01-20 | Disposition: A | Discharge status: Home / Self Care | Payer: Medicaid Other | Source: Ambulatory Visit | Attending: Radiation Oncology | Admitting: Radiation Oncology

## 2015-01-20 DIAGNOSIS — Z510 Encounter for antineoplastic radiation therapy: Secondary | ICD-10-CM | POA: Diagnosis not present

## 2015-01-21 ENCOUNTER — Ambulatory Visit
Admission: RE | Admit: 2015-01-21 | Discharge: 2015-01-21 | Disposition: A | Discharge status: Home / Self Care | Payer: Medicaid Other | Source: Ambulatory Visit | Attending: Radiation Oncology | Admitting: Radiation Oncology

## 2015-01-21 ENCOUNTER — Ambulatory Visit: Payer: Medicaid Other

## 2015-01-21 DIAGNOSIS — Z510 Encounter for antineoplastic radiation therapy: Secondary | ICD-10-CM | POA: Diagnosis not present

## 2015-01-22 ENCOUNTER — Encounter: Payer: Self-pay | Admitting: Radiation Oncology

## 2015-01-22 ENCOUNTER — Telehealth: Payer: Self-pay | Admitting: *Deleted

## 2015-01-22 NOTE — Progress Notes (Signed)
[facility] End of Treatment Note  Name:Andrea [name]  Date: 01/22/2015 RWC:[id number] DOB:Apr 01, 1971   Status:outpatient    CC: [name], MD  Dr. Curt Bears  REFERRING PHYSICIAN: Dr. 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                      PLAN: Routine followup in one month. Patient instructed to call if questions or worsening complaints in interim.

## 2015-01-22 NOTE — Telephone Encounter (Signed)
Oncology Nurse Navigator Documentation  Oncology Nurse Navigator Flowsheets 01/22/2015  Navigator Encounter Type Treatment/called patient today to check on her.  She stated she is going better but still has some pain in her hip.  She is taking pain medication.    I asked patient if she has quit smoking.  Patient stated she has quit.  I encouraged her to continue not to smoke. She said she would try.   Patient Visit Type Follow-up  Treatment Phase Treatment  Barriers/Navigation Needs Education  Education Smoking cessation  Interventions -  Coordination of Care -  Time Spent with Patient 15

## 2015-01-25 ENCOUNTER — Other Ambulatory Visit (HOSPITAL_BASED_OUTPATIENT_CLINIC_OR_DEPARTMENT_OTHER): Payer: No Typology Code available for payment source

## 2015-01-25 ENCOUNTER — Encounter: Payer: Self-pay | Admitting: Internal Medicine

## 2015-01-25 ENCOUNTER — Encounter: Payer: Self-pay | Admitting: *Deleted

## 2015-01-25 ENCOUNTER — Ambulatory Visit (HOSPITAL_BASED_OUTPATIENT_CLINIC_OR_DEPARTMENT_OTHER): Payer: No Typology Code available for payment source | Admitting: Internal Medicine

## 2015-01-25 VITALS — BP 113/78 | HR 107 | Temp 98.2°F | Resp 18 | Ht 66.0 in | Wt 120.7 lb

## 2015-01-25 DIAGNOSIS — C3490 Malignant neoplasm of unspecified part of unspecified bronchus or lung: Secondary | ICD-10-CM

## 2015-01-25 DIAGNOSIS — C7951 Secondary malignant neoplasm of bone: Secondary | ICD-10-CM

## 2015-01-25 DIAGNOSIS — E46 Unspecified protein-calorie malnutrition: Secondary | ICD-10-CM

## 2015-01-25 LAB — COMPREHENSIVE METABOLIC PANEL (CC13)
ALBUMIN: 3.3 g/dL — AB (ref 3.5–5.0)
ALK PHOS: 85 U/L (ref 40–150)
ALT: 18 U/L (ref 0–55)
AST: 13 U/L (ref 5–34)
Anion Gap: 9 mEq/L (ref 3–11)
BUN: 6.9 mg/dL — AB (ref 7.0–26.0)
CALCIUM: 9.9 mg/dL (ref 8.4–10.4)
CO2: 22 mEq/L (ref 22–29)
Chloride: 108 mEq/L (ref 98–109)
Creatinine: 0.8 mg/dL (ref 0.6–1.1)
Glucose: 112 mg/dl (ref 70–140)
POTASSIUM: 3.8 meq/L (ref 3.5–5.1)
Sodium: 139 mEq/L (ref 136–145)
Total Bilirubin: 0.3 mg/dL (ref 0.20–1.20)
Total Protein: 7.8 g/dL (ref 6.4–8.3)

## 2015-01-25 LAB — CBC WITH DIFFERENTIAL/PLATELET
BASO%: 1 % (ref 0.0–2.0)
BASOS ABS: 0 10*3/uL (ref 0.0–0.1)
EOS ABS: 0.3 10*3/uL (ref 0.0–0.5)
EOS%: 7.2 % — ABNORMAL HIGH (ref 0.0–7.0)
HEMATOCRIT: 36 % (ref 34.8–46.6)
HEMOGLOBIN: 12.2 g/dL (ref 11.6–15.9)
LYMPH#: 0.7 10*3/uL — AB (ref 0.9–3.3)
LYMPH%: 17.8 % (ref 14.0–49.7)
MCH: 32 pg (ref 25.1–34.0)
MCHC: 33.9 g/dL (ref 31.5–36.0)
MCV: 94.4 fL (ref 79.5–101.0)
MONO#: 0.3 10*3/uL (ref 0.1–0.9)
MONO%: 7.6 % (ref 0.0–14.0)
NEUT#: 2.7 10*3/uL (ref 1.5–6.5)
NEUT%: 66.4 % (ref 38.4–76.8)
Platelets: 426 10*3/uL — ABNORMAL HIGH (ref 145–400)
RBC: 3.82 10*6/uL (ref 3.70–5.45)
RDW: 13.3 % (ref 11.2–14.5)
WBC: 4 10*3/uL (ref 3.9–10.3)

## 2015-01-25 MED ORDER — METHYLPREDNISOLONE 4 MG PO TBPK: ORAL_TABLET | ORAL | Status: DC

## 2015-01-25 NOTE — Progress Notes (Signed)
Oncology Nurse Navigator Documentation  Oncology Nurse Navigator Flowsheets 01/25/2015  Navigator Encounter Type Treatment/spoke with patient today.  She has quit smoking.  I congratulated her on this accomplishment.  She is having some weight loss and had been referred to dietitian for assistance.  She will be back in 2 weeks to discuss chemotherapy option.  Questions and concerns about pain management addressed   Patient Visit Type Follow-up  Treatment Phase Treatment  Barriers/Navigation Needs Education  Education Pain/ Symptom Management  Interventions -  Coordination of Care -  Time Spent with Patient 15

## 2015-01-25 NOTE — Progress Notes (Signed)
[facility] [phone]   [phone]  OFFICE PROGRESS NOTE  [name], MD [place] [address]  DIAGNOSIS: Stage IV (T1a, N2, M1b) non-small cell lung cancer, adenocarcinoma with negative EGFR mutation diagnosed in August 2016 and presented with right hilar and subcarinal lymphadenopathy as well as large soft tissue mass in the left posterior iliac bone.  PRIOR THERAPY: Status post palliative radiotherapy to the soft tissue mass in the left iliac bone under the care of Dr. Valere Dross  CURRENT THERAPY: None.  INTERVAL HISTORY: Andrea [name] 44 y.o. female returns to the clinic today for follow-up visit. The patient recently completed a course of palliative radiotherapy to the left iliac bone by Dr. Valere Dross and tolerated her treatment well. Her pain has significantly improved but she continues to have persistent pain in that area and she tolerated her pain as 8 on a scale from 1-10 but much improved compared to a few weeks ago per her description. She denied having any significant chest pain, shortness of breath, cough or hemoptysis. She has lack of appetite and weight loss but no significant nausea or vomiting. She has no headache or visual changes. Imaging studies since her last visit including CT scan of the chest, abdomen and pelvis and MRI of the brain showed the right hilar and subcarinal adenopathy as well as the left posterior iliac bone soft tissue mass but no metastatic disease to the brain. The patient is here today for evaluation and discussion of her treatment options.  MEDICAL HISTORY: Past Medical History  Diagnosis Date  . Low back pain radiating to left leg 11/29/2010  . Dysmenorrhea 12/06/2009    Qualifier: Diagnosis of  By: [name] RN, Regino Schultze    . Chronic back pain   . Family history of adverse reaction to anesthesia     mother stopped breathing after surgery  . Shortness of breath dyspnea     with exertion  . Anxiety     . Depression   . Headache     stress  . Anginal pain ([place])     chest pain not sure what related to    ALLERGIES:  is allergic to morphine and related and hydrocodone.  MEDICATIONS:  Current Outpatient Prescriptions  Medication Sig Dispense Refill  . cyanocobalamin 500 MCG tablet Take 500 mcg by mouth daily.    . feeding supplement, ENSURE ENLIVE, (ENSURE ENLIVE) LIQD Take 237 mLs by mouth 3 (three) times daily between meals. 237 mL 12  . Multiple Vitamins-Minerals (MULTIVITAMIN & MINERAL PO) Take 2 tablets by mouth daily.     . Oxycodone HCl 10 MG TABS Take 1 tablet (10 mg total) by mouth every 4 (four) hours as needed (severe pain). 90 tablet 0  . polyethylene glycol (MIRALAX / GLYCOLAX) packet Take 17 g by mouth daily as needed for mild constipation. 14 each 0  . Prenatal Vit-Fe Fumarate-FA (PRENATAL VITAMIN PO) Take 1 tablet by mouth daily.    [name] senna-docusate (SENOKOT-S) 8.6-50 MG per tablet Take 2 tablets by mouth at bedtime. 30 tablet 0  . bisacodyl (DULCOLAX) 10 MG suppository Place 1 suppository (10 mg total) rectally daily as needed for moderate constipation. (Patient not taking: Reported on 01/25/2015) 12 suppository 0   No current facility-administered medications for this visit.    SURGICAL HISTORY:  Past Surgical History  Procedure Laterality Date  . Laproscopy surgery to check her tubes    . Lumbar laminectomy/decompression microdiscectomy Left  05/01/2014    Procedure: Microdiscectomy - L5-S1 - left;  Surgeon: Eustace Moore, MD;  Location: [facility];  Service: Neurosurgery;  Laterality: Left;    REVIEW OF SYSTEMS:  Constitutional: positive for anorexia, fatigue and weight loss Eyes: negative Ears, nose, mouth, throat, and face: negative Respiratory: negative Cardiovascular: negative Gastrointestinal: negative Genitourinary:negative Integument/breast: negative Hematologic/lymphatic: negative Musculoskeletal:positive for bone pain and muscle weakness Neurological:  negative Behavioral/Psych: negative Endocrine: negative Allergic/Immunologic: negative   PHYSICAL EXAMINATION: General appearance: alert, cooperative, fatigued and no distress Head: Normocephalic, without obvious abnormality, atraumatic Neck: no adenopathy, no JVD, supple, symmetrical, trachea midline and thyroid not enlarged, symmetric, no tenderness/mass/nodules Lymph nodes: Cervical, supraclavicular, and axillary nodes normal. Resp: clear to auscultation bilaterally Back: symmetric, no curvature. ROM normal. No CVA tenderness. Cardio: regular rate and rhythm, S1, S2 normal, no murmur, click, rub or gallop GI: soft, non-tender; bowel sounds normal; no masses,  no organomegaly Extremities: extremities normal, atraumatic, no cyanosis or edema Neurologic: Alert and oriented X 3, normal strength and tone. Normal symmetric reflexes. Normal coordination and gait  ECOG PERFORMANCE STATUS: 1 - Symptomatic but completely ambulatory  Blood pressure 113/78, pulse 107, temperature 98.2 F (36.8 C), temperature source Oral, resp. rate 18, height 5' 6"  (1.676 m), weight 120 lb 11.2 oz (54.749 kg), SpO2 98 %.  LABORATORY DATA: Lab Results  Component Value Date   WBC 4.0 01/25/2015   HGB 12.2 01/25/2015   HCT 36.0 01/25/2015   MCV 94.4 01/25/2015   PLT 426* 01/25/2015      Chemistry      Component Value Date/Time   NA 136 12/31/2014 0400   NA 141 12/29/2014 1529   K 3.6 12/31/2014 0400   K 3.1* 12/29/2014 1529   CL 105 12/31/2014 0400   CO2 23 12/31/2014 0400   CO2 21* 12/29/2014 1529   BUN <5* 12/31/2014 0400   BUN 5.6* 12/29/2014 1529   CREATININE 0.68 12/31/2014 0400   CREATININE 0.8 12/29/2014 1529   CREATININE 0.85 01/08/2014 1637      Component Value Date/Time   CALCIUM 9.2 12/31/2014 0400   CALCIUM 10.2 12/29/2014 1529   ALKPHOS 82 12/29/2014 1529   ALKPHOS 74 04/20/2013 1320   AST 16 12/29/2014 1529   AST 17 04/20/2013 1320   ALT 9 12/29/2014 1529   ALT 14 04/20/2013  1320   BILITOT 0.49 12/29/2014 1529   BILITOT 0.5 04/20/2013 1320       RADIOGRAPHIC STUDIES: Ct Chest W Contrast  12/29/2014   CLINICAL DATA:  44 year old female with recent diagnosis of metastatic adenocarcinoma of the lung. Left iliac mass all. Patient presenting with severe left hip pain radiating to the left calf and thigh.  EXAM: CT CHEST, ABDOMEN, AND PELVIS WITH CONTRAST  TECHNIQUE: Multidetector CT imaging of the chest, abdomen and pelvis was performed following the standard protocol during bolus administration of intravenous contrast.  CONTRAST:  86m OMNIPAQUE IOHEXOL 300 MG/ML SOLN, 1034mOMNIPAQUE IOHEXOL 300 MG/ML SOLN  COMPARISON:  CT dated 12/21/2014 and MRI dated 12/14/2014.  FINDINGS: CT CHEST FINDINGS  Mild centrilobular emphysema. There is no focal consolidation, pleural effusion, or pneumothorax. Low attenuating nodular soft tissue densities in the right hilar region and surrounding the right lower lobe pulmonary artery most compatible with enlarged lymph nodes. The largest measures up to 1.5 cm in diameter. A 1.7 x 1.1 cm subcarinal adenopathy noted. low attenuation within these lymph nodes likely represent necrotic changes.The central airways are patent.  The thoracic aorta and pulmonary arteries  are unremarkable. No cardiomegaly or pericardial effusion. The thyroid gland is unremarkable. The esophagus is collapsed.  There is no axillary adenopathy. The chest wall and soft tissues appear unremarkable. The osseous structures of the thorax are intact. No acute fracture or osseous lesion is identified.  CT ABDOMEN AND PELVIS FINDINGS  No intra-abdominal free air.  Trace free fluid within the pelvis.  Subcentimeter right hepatic hypodense lesion is too small to characterize. The liver is otherwise unremarkable. The gallbladder, the pancreas, spleen, adrenal glands appear unremarkable. A 1 cm right renal hypodense lesion likely represents a cyst. Subcentimeter left renal hypodense lesions  are too small to characterize but likely represent cysts. Ultrasound is recommended for further characterization is of the kidneys. There is no hydronephrosis on either side. The visualized ureters and urinary bladder appear unremarkable. The uterus and ovaries are grossly unremarkable.  There is no evidence of bowel obstruction or inflammation. Normal appendix. The abdominal aorta and IVC appear unremarkable. The origins of the celiac axis, SMA, IMA as well as the origins of the renal arteries are patent. No portal venous gas identified. Left iliac chain adenopathy noted.  A 3.8 x 3.8 cm soft tissue mass is noted in the left posterior iliac bone which appears slightly the gait compared to the prior study. There is apparent extension of the lesion into the left SI joint. There is associated destruction of the bone. Wake it is a there is degenerative changes at L5-S1.  IMPRESSION: Right hilar and subcarinal adenopathy.  Left posterior iliac bone known soft tissue mass most compatible with malignancy. This lesion appears slightly larger compared to the prior study and extends into the left SI joint.  Left iliac chain adenopathy.   Electronically Signed   By: Anner Crete M.D.   On: 12/29/2014 23:34   Mr Jeri Cos QT Contrast  12/30/2014   CLINICAL DATA:  Initial evaluation for possible metastatic disease. Recently diagnosed lung cancer  EXAM: MRI HEAD WITHOUT AND WITH CONTRAST  TECHNIQUE: Multiplanar, multiecho pulse sequences of the brain and surrounding structures were obtained without and with intravenous contrast.  CONTRAST:  63m MULTIHANCE GADOBENATE DIMEGLUMINE 529 MG/ML IV SOLN  COMPARISON:  None.  FINDINGS: The CSF containing spaces are within normal limits for patient age. No focal parenchymal signal abnormality is identified. No mass lesion, midline shift, or extra-axial fluid collection. Ventricles are normal in size without evidence of hydrocephalus.  No diffusion-weighted signal abnormality is  identified to suggest acute intracranial infarct. Gray-white matter differentiation is maintained. Normal flow voids are seen within the intracranial vasculature. No intracranial hemorrhage identified.  The cervicomedullary junction is normal. Pituitary gland is within normal limits. Pituitary stalk is midline. The globes and optic nerves demonstrate a normal appearance with normal signal intensity. 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 No abnormal enhancement within the brain itself. Tiny focus of nodular enhancement within the left frontal lobe favored to be vascular nature (series 13, image 43).  The bone marrow signal intensity is normal. Calvarium is intact. Visualized upper cervical spine is within normal limits.  Scalp soft tissues are unremarkable.  Mild scattered opacity within the frontal sinuses and anterior ethmoidal air cells. Paranasal sinuses are otherwise clear. No mastoid effusion.  IMPRESSION: Normal brain MRI with no acute intracranial process identified. No evidence for intracranial metastasis.   Electronically Signed   By: BJeannine BogaM.D.   On: 12/30/2014 01:44   Ct Abdomen Pelvis W Contrast  12/29/2014  CLINICAL DATA:  44 year old female with recent diagnosis of metastatic adenocarcinoma of the lung. Left iliac mass all. Patient presenting with severe left hip pain radiating to the left calf and thigh.  EXAM: CT CHEST, ABDOMEN, AND PELVIS WITH CONTRAST  TECHNIQUE: Multidetector CT imaging of the chest, abdomen and pelvis was performed following the standard protocol during bolus administration of intravenous contrast.  CONTRAST:  61m OMNIPAQUE IOHEXOL 300 MG/ML SOLN, 1019mOMNIPAQUE IOHEXOL 300 MG/ML SOLN  COMPARISON:  CT dated 12/21/2014 and MRI dated 12/14/2014.  FINDINGS: CT CHEST FINDINGS  Mild centrilobular emphysema. There is no focal consolidation, pleural effusion, or pneumothorax. Low attenuating nodular soft tissue densities in the  right hilar region and surrounding the right lower lobe pulmonary artery most compatible with enlarged lymph nodes. The largest measures up to 1.5 cm in diameter. A 1.7 x 1.1 cm subcarinal adenopathy noted. low attenuation within these lymph nodes likely represent necrotic changes.The central airways are patent.  The thoracic aorta and pulmonary arteries are unremarkable. No cardiomegaly or pericardial effusion. The thyroid gland is unremarkable. The esophagus is collapsed.  There is no axillary adenopathy. The chest wall and soft tissues appear unremarkable. The osseous structures of the thorax are intact. No acute fracture or osseous lesion is identified.  CT ABDOMEN AND PELVIS FINDINGS  No intra-abdominal free air.  Trace free fluid within the pelvis.  Subcentimeter right hepatic hypodense lesion is too small to characterize. The liver is otherwise unremarkable. The gallbladder, the pancreas, spleen, adrenal glands appear unremarkable. A 1 cm right renal hypodense lesion likely represents a cyst. Subcentimeter left renal hypodense lesions are too small to characterize but likely represent cysts. Ultrasound is recommended for further characterization is of the kidneys. There is no hydronephrosis on either side. The visualized ureters and urinary bladder appear unremarkable. The uterus and ovaries are grossly unremarkable.  There is no evidence of bowel obstruction or inflammation. Normal appendix. The abdominal aorta and IVC appear unremarkable. The origins of the celiac axis, SMA, IMA as well as the origins of the renal arteries are patent. No portal venous gas identified. Left iliac chain adenopathy noted.  A 3.8 x 3.8 cm soft tissue mass is noted in the left posterior iliac bone which appears slightly the gait compared to the prior study. There is apparent extension of the lesion into the left SI joint. There is associated destruction of the bone. Wake it is a there is degenerative changes at L5-S1.   IMPRESSION: Right hilar and subcarinal adenopathy.  Left posterior iliac bone known soft tissue mass most compatible with malignancy. This lesion appears slightly larger compared to the prior study and extends into the left SI joint.  Left iliac chain adenopathy.   Electronically Signed   By: ArAnner Crete.D.   On: 12/29/2014 23:34   Dg Hip Unilat With Pelvis 2-3 Views Left  12/29/2014   CLINICAL DATA:  Uncontrollable left hip pain. History of back pain radiating to the left leg. Recent diagnosis of metastatic lung cancer and left ilium mass.  EXAM: DG HIP (WITH OR WITHOUT PELVIS) 2-3V LEFT  COMPARISON:  CT abdomen and pelvis 12/29/2014  FINDINGS: Examination is technically limited due to patient rotation hand due to contrast material in overlying bowel hand bladder which limits evaluation of the pelvis. There is no evidence of acute fracture or dislocation of the left hip. Mild degenerative changes are present. The known bone lesion in the left ilium is not well delineated. Visualize soft tissues are unremarkable.  IMPRESSION: Limited study due to residual contrast material obscuring visualization. No evidence of acute fracture or dislocation in the left hip.   Electronically Signed   By: Lucienne Capers M.D.   On: 12/29/2014 23:08    ASSESSMENT AND PLAN: This is a very pleasant 44 years old African-American female recently diagnosed with metastatic non-small cell lung cancer, adenocarcinoma with negative EGFR mutation status post palliative radiotherapy to the left iliac bone and soft tissue mass. I had a lengthy discussion with the patient today about her current condition and treatment options. I will request a blood sample to be sent to [facility] for full panel of mutation testing. I also discussed with the patient her prognosis with and without treatment. The patient understands that she has incurable condition. I gave her the option of palliative care and hospice referral versus  consideration of palliative systemic chemotherapy with carboplatin for AUC of 5 and Alimta 500 MG/M2 every 3 weeks. I discussed with the patient adverse effects of this treatment including but not limited to alopecia, myelosuppression, nausea and vomiting, peripheral neuropathy, liver or renal dysfunction. The patient would like to wait a few more weeks before starting any systemic therapy because her current house and social setting is not good enough for her to start taking chemotherapy. I will arrange for the patient to come back for follow-up visit in 2 weeks for reevaluation and more discussion of her treatment options. For the lack of appetite, I will start the patient on Medrol Dosepak and I referred her to the dietitian at the [facility] for nutritional evaluation. For pain management, the patient will continue with her current pain medication with oxycodone. She was advised to call immediately if she has any concerning symptoms in the interval. The patient voices understanding of current disease status and treatment options and is in agreement with the current care plan.  All questions were answered. The patient knows to call the clinic with any problems, questions or concerns. We can certainly see the patient much sooner if necessary.  I spent 15 minutes counseling the patient face to face. The total time spent in the appointment was 25 minutes.  Disclaimer: This note was dictated with voice recognition software. Similar sounding words can inadvertently be transcribed and may not be corrected upon review.

## 2015-01-26 ENCOUNTER — Telehealth: Payer: Self-pay | Admitting: Internal Medicine

## 2015-01-26 ENCOUNTER — Telehealth: Payer: Self-pay | Admitting: Medical Oncology

## 2015-01-26 NOTE — Telephone Encounter (Signed)
I told pt that Julien Nordmann will discuss b 12 injection at next appt.

## 2015-01-26 NOTE — Telephone Encounter (Signed)
s.w. pt and advised on OCT appt....pt ok and aware °

## 2015-02-08 ENCOUNTER — Ambulatory Visit (HOSPITAL_BASED_OUTPATIENT_CLINIC_OR_DEPARTMENT_OTHER): Payer: No Typology Code available for payment source | Admitting: Internal Medicine

## 2015-02-08 ENCOUNTER — Other Ambulatory Visit (HOSPITAL_BASED_OUTPATIENT_CLINIC_OR_DEPARTMENT_OTHER): Payer: No Typology Code available for payment source

## 2015-02-08 ENCOUNTER — Encounter: Payer: Self-pay | Admitting: *Deleted

## 2015-02-08 ENCOUNTER — Encounter: Payer: Self-pay | Admitting: Medical Oncology

## 2015-02-08 ENCOUNTER — Encounter: Payer: Self-pay | Admitting: Internal Medicine

## 2015-02-08 ENCOUNTER — Encounter: Payer: No Typology Code available for payment source | Admitting: Nutrition

## 2015-02-08 ENCOUNTER — Telehealth: Payer: Self-pay | Admitting: Internal Medicine

## 2015-02-08 VITALS — BP 104/74 | HR 98 | Temp 98.3°F | Resp 20 | Ht 66.0 in | Wt 121.2 lb

## 2015-02-08 DIAGNOSIS — C7951 Secondary malignant neoplasm of bone: Secondary | ICD-10-CM

## 2015-02-08 DIAGNOSIS — C3490 Malignant neoplasm of unspecified part of unspecified bronchus or lung: Secondary | ICD-10-CM

## 2015-02-08 DIAGNOSIS — M898X9 Other specified disorders of bone, unspecified site: Secondary | ICD-10-CM

## 2015-02-08 LAB — CBC WITH DIFFERENTIAL/PLATELET
BASO%: 0.5 % (ref 0.0–2.0)
BASOS ABS: 0 10*3/uL (ref 0.0–0.1)
EOS ABS: 0.1 10*3/uL (ref 0.0–0.5)
EOS%: 1.2 % (ref 0.0–7.0)
HCT: 40 % (ref 34.8–46.6)
HEMOGLOBIN: 13.5 g/dL (ref 11.6–15.9)
LYMPH%: 18.3 % (ref 14.0–49.7)
MCH: 32 pg (ref 25.1–34.0)
MCHC: 33.8 g/dL (ref 31.5–36.0)
MCV: 94.8 fL (ref 79.5–101.0)
MONO#: 0.3 10*3/uL (ref 0.1–0.9)
MONO%: 6 % (ref 0.0–14.0)
NEUT%: 74 % (ref 38.4–76.8)
NEUTROS ABS: 3.2 10*3/uL (ref 1.5–6.5)
PLATELETS: 290 10*3/uL (ref 145–400)
RBC: 4.22 10*6/uL (ref 3.70–5.45)
RDW: 14.1 % (ref 11.2–14.5)
WBC: 4.3 10*3/uL (ref 3.9–10.3)
lymph#: 0.8 10*3/uL — ABNORMAL LOW (ref 0.9–3.3)

## 2015-02-08 LAB — COMPREHENSIVE METABOLIC PANEL (CC13)
ALBUMIN: 3.8 g/dL (ref 3.5–5.0)
ALK PHOS: 94 U/L (ref 40–150)
ALT: 16 U/L (ref 0–55)
ANION GAP: 12 meq/L — AB (ref 3–11)
AST: 13 U/L (ref 5–34)
BILIRUBIN TOTAL: 0.63 mg/dL (ref 0.20–1.20)
BUN: 7.4 mg/dL (ref 7.0–26.0)
CO2: 24 mEq/L (ref 22–29)
Calcium: 10.5 mg/dL — ABNORMAL HIGH (ref 8.4–10.4)
Chloride: 104 mEq/L (ref 98–109)
Creatinine: 0.9 mg/dL (ref 0.6–1.1)
GLUCOSE: 108 mg/dL (ref 70–140)
POTASSIUM: 3.7 meq/L (ref 3.5–5.1)
SODIUM: 139 meq/L (ref 136–145)
TOTAL PROTEIN: 8.7 g/dL — AB (ref 6.4–8.3)

## 2015-02-08 MED ORDER — OXYCODONE HCL 10 MG PO TABS: 10.0000 mg | ORAL_TABLET | ORAL | Status: DC | PRN

## 2015-02-08 MED ORDER — FOLIC ACID 1 MG PO TABS: 1.0000 mg | ORAL_TABLET | Freq: Every day | ORAL | Status: DC

## 2015-02-08 MED ORDER — DEXAMETHASONE 4 MG PO TABS: ORAL_TABLET | ORAL | Status: DC

## 2015-02-08 MED ORDER — PROCHLORPERAZINE MALEATE 10 MG PO TABS: 10.0000 mg | ORAL_TABLET | Freq: Four times a day (QID) | ORAL | Status: DC | PRN

## 2015-02-08 MED ORDER — CYANOCOBALAMIN 1000 MCG/ML IJ SOLN
1000.0000 ug | Freq: Once | INTRAMUSCULAR | Status: AC
  Administered 2015-02-08: 1000 ug via INTRAMUSCULAR

## 2015-02-08 NOTE — Progress Notes (Signed)
[facility] [phone]   [phone]) [phone]  OFFICE PROGRESS NOTE  [name], MD [place] [address]  DIAGNOSIS: Stage IV (T1a, N2, M1b) non-small cell lung cancer, adenocarcinoma with negative EGFR mutation diagnosed in August 2016 and presented with right hilar and subcarinal lymphadenopathy as well as large soft tissue mass in the left posterior iliac bone.  PRIOR THERAPY: Status post palliative radiotherapy to the soft tissue mass in the left iliac bone under the care of Dr. Valere Dross  CURRENT THERAPY: Systemic chemotherapy with carboplatin for AUC of 5 and Alimta 500 MG/M2 every 3 weeks. First dose expected on 02/22/2015  INTERVAL HISTORY: Andrea [name] 44 y.o. female returns to the clinic today for follow-up visit. She is feeling fine with no specific complaints except for the persistent pain in the left iliac area. She is currently on oxycodone 10 mg tablet and she takes 2 tablets every 6 hours. She denied having any significant chest pain, shortness of breath, cough or hemoptysis. She has no significant weight loss or night sweats. She has no headache or visual changes. She is here again for reevaluation and discussion of her treatment options.  MEDICAL HISTORY: Past Medical History  Diagnosis Date  . Low back pain radiating to left leg 11/29/2010  . Dysmenorrhea 12/06/2009    Qualifier: Diagnosis of  By: [name] RN, Regino Schultze    . Chronic back pain   . Family history of adverse reaction to anesthesia     mother stopped breathing after surgery  . Shortness of breath dyspnea     with exertion  . Anxiety   . Depression   . Headache     stress  . Anginal pain ([place])     chest pain not sure what related to    ALLERGIES:  is allergic to morphine and related and hydrocodone.  MEDICATIONS:  Current Outpatient Prescriptions  Medication Sig Dispense Refill  . feeding supplement, ENSURE ENLIVE, (ENSURE ENLIVE) LIQD Take 237 mLs by  mouth 3 (three) times daily between meals. 237 mL 12  . Multiple Vitamins-Minerals (MULTIVITAMIN & MINERAL PO) Take 2 tablets by mouth daily.     . Oxycodone HCl 10 MG TABS Take 1 tablet (10 mg total) by mouth every 4 (four) hours as needed (severe pain). 90 tablet 0  . Prenatal Vit-Fe Fumarate-FA (PRENATAL VITAMIN PO) Take 1 tablet by mouth daily.    [name] senna-docusate (SENOKOT-S) 8.6-50 MG per tablet Take 2 tablets by mouth at bedtime. 30 tablet 0  . cyanocobalamin 500 MCG tablet Take 500 mcg by mouth daily.    . polyethylene glycol (MIRALAX / GLYCOLAX) packet Take 17 g by mouth daily as needed for mild constipation. (Patient not taking: Reported on 02/08/2015) 14 each 0   No current facility-administered medications for this visit.    SURGICAL HISTORY:  Past Surgical History  Procedure Laterality Date  . Laproscopy surgery to check her tubes    . Lumbar laminectomy/decompression microdiscectomy Left 05/01/2014    Procedure: Microdiscectomy - L5-S1 - left;  Surgeon: Eustace Moore, MD;  Location: [facility];  Service: Neurosurgery;  Laterality: Left;    REVIEW OF SYSTEMS:  Constitutional: positive for anorexia and fatigue Eyes: negative Ears, nose, mouth, throat, and face: negative Respiratory: negative Cardiovascular: negative Gastrointestinal: negative Genitourinary:negative Integument/breast: negative Hematologic/lymphatic: negative Musculoskeletal:positive for bone pain and muscle weakness Neurological: negative Behavioral/Psych: negative Endocrine: negative Allergic/Immunologic: negative   PHYSICAL EXAMINATION: General appearance: alert, cooperative, fatigued  and no distress Head: Normocephalic, without obvious abnormality, atraumatic Neck: no adenopathy, no JVD, supple, symmetrical, trachea midline and thyroid not enlarged, symmetric, no tenderness/mass/nodules Lymph nodes: Cervical, supraclavicular, and axillary nodes normal. Resp: clear to auscultation bilaterally Back:  symmetric, no curvature. ROM normal. No CVA tenderness. Cardio: regular rate and rhythm, S1, S2 normal, no murmur, click, rub or gallop GI: soft, non-tender; bowel sounds normal; no masses,  no organomegaly Extremities: extremities normal, atraumatic, no cyanosis or edema Neurologic: Alert and oriented X 3, normal strength and tone. Normal symmetric reflexes. Normal coordination and gait  ECOG PERFORMANCE STATUS: 1 - Symptomatic but completely ambulatory  Blood pressure 104/74, pulse 98, temperature 98.3 F (36.8 C), temperature source Oral, resp. rate 20, height 5' 6"  (1.676 m), weight 121 lb 3.2 oz (54.976 kg), SpO2 100 %.  LABORATORY DATA: Lab Results  Component Value Date   WBC 4.3 02/08/2015   HGB 13.5 02/08/2015   HCT 40.0 02/08/2015   MCV 94.8 02/08/2015   PLT 290 02/08/2015      Chemistry      Component Value Date/Time   NA 139 01/25/2015 1500   NA 136 12/31/2014 0400   K 3.8 01/25/2015 1500   K 3.6 12/31/2014 0400   CL 105 12/31/2014 0400   CO2 22 01/25/2015 1500   CO2 23 12/31/2014 0400   BUN 6.9* 01/25/2015 1500   BUN <5* 12/31/2014 0400   CREATININE 0.8 01/25/2015 1500   CREATININE 0.68 12/31/2014 0400   CREATININE 0.85 01/08/2014 1637      Component Value Date/Time   CALCIUM 9.9 01/25/2015 1500   CALCIUM 9.2 12/31/2014 0400   ALKPHOS 85 01/25/2015 1500   ALKPHOS 74 04/20/2013 1320   AST 13 01/25/2015 1500   AST 17 04/20/2013 1320   ALT 18 01/25/2015 1500   ALT 14 04/20/2013 1320   BILITOT 0.30 01/25/2015 1500   BILITOT 0.5 04/20/2013 1320       RADIOGRAPHIC STUDIES: No results found.  ASSESSMENT AND PLAN: This is a very pleasant 44 years old African-American female recently diagnosed with metastatic non-small cell lung cancer, adenocarcinoma with negative EGFR mutation status post palliative radiotherapy to the left iliac bone and soft tissue mass. I had a lengthy discussion with the patient today about her current condition and treatment  options. She had blood test sent to [facility] for full panel of mutation testing. I gave her again the option of palliative care and hospice referral versus consideration of palliative systemic chemotherapy with carboplatin for AUC of 5 and Alimta 500 MG/M2 every 3 weeks. I discussed with the patient adverse effects of this treatment including but not limited to alopecia, myelosuppression, nausea and vomiting, peripheral neuropathy, liver or renal dysfunction. She would like to wait until 02/22/2015 before starting the first cycle of her chemotherapy because she still waiting on her social services check to move to a different apartment. She is expected to start the first cycle of her treatment on 02/22/2015. The patient received vitamin B 12 injection today. I will call her pharmacy with prescription for Compazine 10 mg by mouth every 6 hours as needed for nausea, Decadron 4 mg by mouth twice a day the day before, day of and day after the chemotherapy in addition to folic acid 1 mg by mouth daily. For pain management, the patient will continue with her current pain medication with oxycodone and she was giving a refill of her medication.[name] She was advised to call immediately if she has any concerning  symptoms in the interval. The patient voices understanding of current disease status and treatment options and is in agreement with the current care plan.  All questions were answered. The patient knows to call the clinic with any problems, questions or concerns. We can certainly see the patient much sooner if necessary.  I spent 15 minutes counseling the patient face to face. The total time spent in the appointment was 25 minutes.  Disclaimer: This note was dictated with voice recognition software. Similar sounding words can inadvertently be transcribed and may not be corrected upon review.

## 2015-02-08 NOTE — Telephone Encounter (Signed)
Gave and printed appt sched and avs for pt for OCT thru [facility]

## 2015-02-08 NOTE — Progress Notes (Signed)
Oncology Nurse Navigator Documentation  Oncology Nurse Navigator Flowsheets 02/08/2015  Navigator Encounter Type Clinic/MDC/spoke with patient today at office visit with Dr. Julien Nordmann.  I explained how to take dexamethasone and what that was for.  I also explained patient has a mandatory chemo education class.  She verbalized understanding   Patient Visit Type Follow-up  Treatment Phase Treatment  Barriers/Navigation Needs Education  Education Other  Interventions -  Coordination of Care -  Time Spent with Patient 15

## 2015-02-09 ENCOUNTER — Encounter: Payer: Self-pay | Admitting: Internal Medicine

## 2015-02-09 NOTE — Progress Notes (Signed)
I placed alimta--patient one application on desk of nurse for dr. Julien Nordmann

## 2015-02-09 NOTE — Progress Notes (Signed)
I called the patient and left message. I need for her to bring income info when she comes to see if she can get asst with Alimta--no insurance.

## 2015-02-10 NOTE — Progress Notes (Signed)
[facility] Work  Clinical Social Work met with patient to explore financial resources available to patient. CSW and patient: called Cancer Care- patient will bring application to CSW once received in the mail. Completed [place] lung cancer initiative application and CSW will fax document. Completed TAG program application and CSW will fax document. The patient plans to call Patient advocate foundation to start application process and will bring to CSW once received in the mail.  CSW also working with financial advocate in the hospital to receive Medicaid.  She has been approved for SSI, but has not receiving SSI check yet.  Polo Riley, MSW, LCSW, OSW-C Clinical Social Worker [facility] [phone]

## 2015-02-13 ENCOUNTER — Encounter: Payer: Self-pay | Admitting: Radiation Oncology

## 2015-02-15 ENCOUNTER — Telehealth: Payer: Self-pay | Admitting: *Deleted

## 2015-02-16 ENCOUNTER — Encounter: Payer: Self-pay | Admitting: Radiation Oncology

## 2015-02-16 ENCOUNTER — Ambulatory Visit
Admission: RE | Admit: 2015-02-16 | Discharge: 2015-02-16 | Disposition: A | Discharge status: Home / Self Care | Payer: No Typology Code available for payment source | Source: Ambulatory Visit | Attending: Radiation Oncology | Admitting: Radiation Oncology

## 2015-02-16 VITALS — BP 93/57 | HR 76 | Temp 97.5°F | Ht 66.0 in | Wt 126.4 lb

## 2015-02-16 DIAGNOSIS — C7951 Secondary malignant neoplasm of bone: Secondary | ICD-10-CM

## 2015-02-16 HISTORY — DX: Personal history of irradiation: Z92.3

## 2015-02-16 NOTE — Progress Notes (Signed)
Ms. Andrea [name] seen today in FU.  Reports level 6/10 pain in there left hip/buttock region. Ambulating with a crutch today with an unsteady gait.

## 2015-02-16 NOTE — Telephone Encounter (Signed)
Unable to reach pt after attempts to callback. LMOVM - pt concern for referral to pallative care given to MD, Advised Hand[employer]app Placard ready for Pick up at front desk.
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
## 2015-02-22 ENCOUNTER — Telehealth: Payer: Self-pay | Admitting: Internal Medicine

## 2015-02-22 ENCOUNTER — Ambulatory Visit (HOSPITAL_BASED_OUTPATIENT_CLINIC_OR_DEPARTMENT_OTHER): Payer: No Typology Code available for payment source | Admitting: Physician Assistant

## 2015-02-22 ENCOUNTER — Ambulatory Visit (HOSPITAL_BASED_OUTPATIENT_CLINIC_OR_DEPARTMENT_OTHER): Payer: No Typology Code available for payment source

## 2015-02-22 ENCOUNTER — Other Ambulatory Visit: Payer: No Typology Code available for payment source

## 2015-02-22 ENCOUNTER — Ambulatory Visit: Payer: No Typology Code available for payment source | Admitting: Nutrition

## 2015-02-22 ENCOUNTER — Other Ambulatory Visit (HOSPITAL_BASED_OUTPATIENT_CLINIC_OR_DEPARTMENT_OTHER): Payer: No Typology Code available for payment source

## 2015-02-22 VITALS — HR 95

## 2015-02-22 VITALS — BP 109/84 | HR 108 | Temp 97.7°F | Resp 20 | Ht 66.0 in | Wt 124.5 lb

## 2015-02-22 DIAGNOSIS — C3490 Malignant neoplasm of unspecified part of unspecified bronchus or lung: Secondary | ICD-10-CM

## 2015-02-22 DIAGNOSIS — Z5111 Encounter for antineoplastic chemotherapy: Secondary | ICD-10-CM

## 2015-02-22 DIAGNOSIS — C7951 Secondary malignant neoplasm of bone: Secondary | ICD-10-CM

## 2015-02-22 DIAGNOSIS — E46 Unspecified protein-calorie malnutrition: Secondary | ICD-10-CM

## 2015-02-22 DIAGNOSIS — R52 Pain, unspecified: Secondary | ICD-10-CM

## 2015-02-22 LAB — COMPREHENSIVE METABOLIC PANEL (CC13)
ALBUMIN: 3.5 g/dL (ref 3.5–5.0)
ALK PHOS: 94 U/L (ref 40–150)
ALT: 14 U/L (ref 0–55)
ANION GAP: 9 meq/L (ref 3–11)
AST: 15 U/L (ref 5–34)
BUN: 7.6 mg/dL (ref 7.0–26.0)
CALCIUM: 10.9 mg/dL — AB (ref 8.4–10.4)
CO2: 23 mEq/L (ref 22–29)
Chloride: 105 mEq/L (ref 98–109)
Creatinine: 0.8 mg/dL (ref 0.6–1.1)
Glucose: 131 mg/dl (ref 70–140)
POTASSIUM: 3.9 meq/L (ref 3.5–5.1)
SODIUM: 137 meq/L (ref 136–145)
Total Protein: 8.7 g/dL — ABNORMAL HIGH (ref 6.4–8.3)

## 2015-02-22 LAB — CBC WITH DIFFERENTIAL/PLATELET
BASO%: 0.2 % (ref 0.0–2.0)
BASOS ABS: 0 10*3/uL (ref 0.0–0.1)
EOS%: 0 % (ref 0.0–7.0)
Eosinophils Absolute: 0 10*3/uL (ref 0.0–0.5)
HCT: 36.6 % (ref 34.8–46.6)
HGB: 12.7 g/dL (ref 11.6–15.9)
LYMPH%: 15.3 % (ref 14.0–49.7)
MCH: 32.2 pg (ref 25.1–34.0)
MCHC: 34.7 g/dL (ref 31.5–36.0)
MCV: 92.7 fL (ref 79.5–101.0)
MONO#: 0.5 10*3/uL (ref 0.1–0.9)
MONO%: 8.9 % (ref 0.0–14.0)
NEUT#: 3.8 10*3/uL (ref 1.5–6.5)
NEUT%: 75.6 % (ref 38.4–76.8)
PLATELETS: 422 10*3/uL — AB (ref 145–400)
RBC: 3.95 10*6/uL (ref 3.70–5.45)
RDW: 13.7 % (ref 11.2–14.5)
WBC: 5 10*3/uL (ref 3.9–10.3)
lymph#: 0.8 10*3/uL — ABNORMAL LOW (ref 0.9–3.3)
nRBC: 0 % (ref 0–0)

## 2015-02-22 MED ORDER — CARBOPLATIN CHEMO INJECTION 600 MG/60ML
518.5000 mg | Freq: Once | INTRAVENOUS | Status: AC
  Administered 2015-02-22: 520 mg via INTRAVENOUS
  Filled 2015-02-22: qty 52

## 2015-02-22 MED ORDER — SODIUM CHLORIDE 0.9 % IV SOLN
Freq: Once | INTRAVENOUS | Status: AC
  Administered 2015-02-22: 12:00:00 via INTRAVENOUS
  Filled 2015-02-22: qty 8

## 2015-02-22 MED ORDER — PEMETREXED DISODIUM CHEMO INJECTION 500 MG
500.0000 mg/m2 | Freq: Once | INTRAVENOUS | Status: AC
  Administered 2015-02-22: 800 mg via INTRAVENOUS
  Filled 2015-02-22: qty 32

## 2015-02-22 MED ORDER — SODIUM CHLORIDE 0.9 % IV SOLN
Freq: Once | INTRAVENOUS | Status: AC
  Administered 2015-02-22: 12:00:00 via INTRAVENOUS

## 2015-02-22 NOTE — Patient Instructions (Signed)
[facility] Discharge Instructions for Patients Receiving Chemotherapy  Today you received the following chemotherapy agents Alimta/Carboplatin  To help prevent nausea and vomiting after your treatment, we encourage you to take your nausea medication as prescribed.  If you develop nausea and vomiting that is not controlled by your nausea medication, call the clinic.   BELOW ARE SYMPTOMS THAT SHOULD BE REPORTED IMMEDIATELY:  *FEVER GREATER THAN 100.5 F  *CHILLS WITH OR WITHOUT FEVER  NAUSEA AND VOMITING THAT IS NOT CONTROLLED WITH YOUR NAUSEA MEDICATION  *UNUSUAL SHORTNESS OF BREATH  *UNUSUAL BRUISING OR BLEEDING  TENDERNESS IN MOUTH AND THROAT WITH OR WITHOUT PRESENCE OF ULCERS  *URINARY PROBLEMS  *BOWEL PROBLEMS  UNUSUAL RASH Items with * indicate a potential emergency and should be followed up as soon as possible.  Feel free to call the clinic you have any questions or concerns. The clinic phone number is [phone].  Please show the [facility] at check-in to the Emergency Department and triage nurse.   Pemetrexed injection (Alimta) What is this medicine? PEMETREXED (PEM e TREX ed) is a chemotherapy drug. This medicine affects cells that are rapidly growing, such as cancer cells and cells in your mouth and stomach. It is usually used to treat lung cancers like non-small cell lung cancer and mesothelioma. It may also be used to treat other cancers. This medicine may be used for other purposes; ask your health care provider or pharmacist if you have questions. What should I tell my health care provider before I take this medicine? They need to know if you have any of these conditions: -if you frequently drink alcohol containing beverages -infection (especially a virus infection such as chickenpox, cold sores, or herpes) -kidney disease -liver disease -low blood counts, like low platelets, red bloods, or white blood cells -an unusual or allergic  reaction to pemetrexed, mannitol, other medicines, foods, dyes, or preservatives -pregnant or trying to get pregnant -breast-feeding How should I use this medicine? This drug is given as an infusion into a vein. It is administered in a hospital or clinic by a specially trained health care professional. Talk to your pediatrician regarding the use of this medicine in children. Special care may be needed. Overdosage: If you think you have taken too much of this medicine contact a poison control center or emergency room at once. NOTE: This medicine is only for you. Do not share this medicine with others. What if I miss a dose? It is important not to miss your dose. Call your doctor or health care professional if you are unable to keep an appointment. What may interact with this medicine? -aspirin and aspirin-like medicines -medicines to increase blood counts like filgrastim, pegfilgrastim, sargramostim -methotrexate -NSAIDS, medicines for pain and inflammation, like ibuprofen or naproxen -probenecid -pyrimethamine -vaccines Talk to your doctor or health care professional before taking any of these medicines: -acetaminophen -aspirin -ibuprofen -ketoprofen -naproxen This list may not describe all possible interactions. Give your health care provider a list of all the medicines, herbs, non-prescription drugs, or dietary supplements you use. Also tell them if you smoke, drink alcohol, or use illegal drugs. Some items may interact with your medicine. What should I watch for while using this medicine? Visit your doctor for checks on your progress. This drug may make you feel generally unwell. This is not uncommon, as chemotherapy can affect healthy cells as well as cancer cells. Report any side effects. Continue your course of treatment even though you feel ill  unless your doctor tells you to stop. In some cases, you may be given additional medicines to help with side effects. Follow all directions  for their use. Call your doctor or health care professional for advice if you get a fever, chills or sore throat, or other symptoms of a cold or flu. Do not treat yourself. This drug decreases your body's ability to fight infections. Try to avoid being around people who are sick. This medicine may increase your risk to bruise or bleed. Call your doctor or health care professional if you notice any unusual bleeding. Be careful brushing and flossing your teeth or using a toothpick because you may get an infection or bleed more easily. If you have any dental work done, tell your dentist you are receiving this medicine. Avoid taking products that contain aspirin, acetaminophen, ibuprofen, naproxen, or ketoprofen unless instructed by your doctor. These medicines may hide a fever. Call your doctor or health care professional if you get diarrhea or mouth sores. Do not treat yourself. To protect your kidneys, drink water or other fluids as directed while you are taking this medicine. Men and women must use effective birth control while taking this medicine. You may also need to continue using effective birth control for a time after stopping this medicine. Do not become pregnant while taking this medicine. Tell your doctor right away if you think that you or your partner might be pregnant. There is a potential for serious side effects to an unborn child. Talk to your health care professional or pharmacist for more information. Do not breast-feed an infant while taking this medicine. This medicine may lower sperm counts. What side effects may I notice from receiving this medicine? Side effects that you should report to your doctor or health care professional as soon as possible: -allergic reactions like skin rash, itching or hives, swelling of the face, lips, or tongue -low blood counts - this medicine may decrease the number of white blood cells, red blood cells and platelets. You may be at increased risk for  infections and bleeding. -signs of infection - fever or chills, cough, sore throat, pain or difficulty passing urine -signs of decreased platelets or bleeding - bruising, pinpoint red spots on the skin, black, tarry stools, blood in the urine -signs of decreased red blood cells - unusually weak or tired, fainting spells, lightheadedness -breathing problems, like a dry cough -changes in emotions or moods -chest pain -confusion -diarrhea -high blood pressure -mouth or throat sores or ulcers -pain, swelling, warmth in the leg -pain on swallowing -swelling of the ankles, feet, hands -trouble passing urine or change in the amount of urine -vomiting -yellowing of the eyes or skin Side effects that usually do not require medical attention (report to your doctor or health care professional if they continue or are bothersome): -hair loss -loss of appetite -nausea -stomach upset This list may not describe all possible side effects. Call your doctor for medical advice about side effects. You may report side effects to FDA at 1-800-FDA-1088. Where should I keep my medicine? This drug is given in a hospital or clinic and will not be stored at home. NOTE: This sheet is a summary. It may not cover all possible information. If you have questions about this medicine, talk to your doctor, pharmacist, or health care provider.    2016, Elsevier/Gold Standard. (2007-11-12 13:24:03)   Carboplatin injection What is this medicine? CARBOPLATIN (KAR boe pla tin) is a chemotherapy drug. It targets fast dividing cells,  like cancer cells, and causes these cells to die. This medicine is used to treat ovarian cancer and many other cancers. This medicine may be used for other purposes; ask your health care provider or pharmacist if you have questions. What should I tell my health care provider before I take this medicine? They need to know if you have any of these conditions: -blood disorders -hearing  problems -kidney disease -recent or ongoing radiation therapy -an unusual or allergic reaction to carboplatin, cisplatin, other chemotherapy, other medicines, foods, dyes, or preservatives -pregnant or trying to get pregnant -breast-feeding How should I use this medicine? This drug is usually given as an infusion into a vein. It is administered in a hospital or clinic by a specially trained health care professional. Talk to your pediatrician regarding the use of this medicine in children. Special care may be needed. Overdosage: If you think you have taken too much of this medicine contact a poison control center or emergency room at once. NOTE: This medicine is only for you. Do not share this medicine with others. What if I miss a dose? It is important not to miss a dose. Call your doctor or health care professional if you are unable to keep an appointment. What may interact with this medicine? -medicines for seizures -medicines to increase blood counts like filgrastim, pegfilgrastim, sargramostim -some antibiotics like amikacin, gentamicin, neomycin, streptomycin, tobramycin -vaccines Talk to your doctor or health care professional before taking any of these medicines: -acetaminophen -aspirin -ibuprofen -ketoprofen -naproxen This list may not describe all possible interactions. Give your health care provider a list of all the medicines, herbs, non-prescription drugs, or dietary supplements you use. Also tell them if you smoke, drink alcohol, or use illegal drugs. Some items may interact with your medicine. What should I watch for while using this medicine? Your condition will be monitored carefully while you are receiving this medicine. You will need important blood work done while you are taking this medicine. This drug may make you feel generally unwell. This is not uncommon, as chemotherapy can affect healthy cells as well as cancer cells. Report any side effects. Continue your course  of treatment even though you feel ill unless your doctor tells you to stop. In some cases, you may be given additional medicines to help with side effects. Follow all directions for their use. Call your doctor or health care professional for advice if you get a fever, chills or sore throat, or other symptoms of a cold or flu. Do not treat yourself. This drug decreases your body's ability to fight infections. Try to avoid being around people who are sick. This medicine may increase your risk to bruise or bleed. Call your doctor or health care professional if you notice any unusual bleeding. Be careful brushing and flossing your teeth or using a toothpick because you may get an infection or bleed more easily. If you have any dental work done, tell your dentist you are receiving this medicine. Avoid taking products that contain aspirin, acetaminophen, ibuprofen, naproxen, or ketoprofen unless instructed by your doctor. These medicines may hide a fever. Do not become pregnant while taking this medicine. Women should inform their doctor if they wish to become pregnant or think they might be pregnant. There is a potential for serious side effects to an unborn child. Talk to your health care professional or pharmacist for more information. Do not breast-feed an infant while taking this medicine. What side effects may I notice from receiving this medicine?  Side effects that you should report to your doctor or health care professional as soon as possible: -allergic reactions like skin rash, itching or hives, swelling of the face, lips, or tongue -signs of infection - fever or chills, cough, sore throat, pain or difficulty passing urine -signs of decreased platelets or bleeding - bruising, pinpoint red spots on the skin, black, tarry stools, nosebleeds -signs of decreased red blood cells - unusually weak or tired, fainting spells, lightheadedness -breathing problems -changes in hearing -changes in  vision -chest pain -high blood pressure -low blood counts - This drug may decrease the number of white blood cells, red blood cells and platelets. You may be at increased risk for infections and bleeding. -nausea and vomiting -pain, swelling, redness or irritation at the injection site -pain, tingling, numbness in the hands or feet -problems with balance, talking, walking -trouble passing urine or change in the amount of urine Side effects that usually do not require medical attention (report to your doctor or health care professional if they continue or are bothersome): -hair loss -loss of appetite -metallic taste in the mouth or changes in taste This list may not describe all possible side effects. Call your doctor for medical advice about side effects. You may report side effects to FDA at 1-800-FDA-1088. Where should I keep my medicine? This drug is given in a hospital or clinic and will not be stored at home. NOTE: This sheet is a summary. It may not cover all possible information. If you have questions about this medicine, talk to your doctor, pharmacist, or health care provider.    2016, Elsevier/Gold Standard. (2007-07-16 14:38:05)

## 2015-02-22 NOTE — Progress Notes (Signed)
44 year old female diagnosed with metastatic non-small cell lung cancer.  She is a patient of Dr. Earlie Server.  She has completed radiation therapy for bowel metastases. Past medical history includes anxiety, and depression.  Medications include Dulcolax, prednisone, multivitamin, MiraLAX, and Senokot-S.  Labs include albumin 3.3.  Height: 66 inches. Weight: 124.5 pounds on October 31. Usual body weight: 155 pounds. BMI: 20.1.  Patient is receiving her first chemotherapy today. She reports a poor appetite along with nausea and vomiting. Patient states she lost about 25 pounds in one month. Patient does not want to lose anymore weight and is anxious to try gain weight.  Nutrition diagnosis: Unintended weight loss related to metastatic lung cancer as evidenced by 19% weight loss over 6 months.  Severe protein calorie malnutrition as evidenced by greater than 10% weight loss over 6 months and severe depletion of body fat and muscle mass.  Intervention: Educated patient to consume small frequent meals and snacks consuming high protein high calorie foods. Fact sheets were provided. Educated patient on strategies for eating if she develops nausea and vomiting. Fact sheet provided. Encouraged patient to try oral nutrition supplements. Provided some basic recipes on ways to add additional calories. Questions were answered.  Teach back method was used.  Contact information was provided.  Monitoring, evaluation, goals: Patient will tolerate increased calories and protein to minimize further weight loss.  Next visit: November 21 during infusion.    **Disclaimer: This note was dictated with voice recognition software. Similar sounding words can inadvertently be transcribed and this note may contain transcription errors which may not have been corrected upon publication of note.**

## 2015-02-22 NOTE — Telephone Encounter (Signed)
per pof to sch pt appt-gave pt copy of avs °

## 2015-02-22 NOTE — Progress Notes (Signed)
[facility] [phone]   [phone]  OFFICE PROGRESS NOTE  [name], MD [place] [address]  DIAGNOSIS: Stage IV (T1a, N2, M1b) non-small cell lung cancer, adenocarcinoma with negative EGFR mutation diagnosed in August 2016 and presented with right hilar and subcarinal lymphadenopathy as well as large soft tissue mass in the left posterior iliac bone.  PRIOR THERAPY: Status post palliative radiotherapy to the soft tissue mass in the left iliac bone under the care of Dr. Valere Dross  CURRENT THERAPY: Systemic chemotherapy with carboplatin for AUC of 5 and Alimta 500 MG/M2 every 3 weeks. First dose expected on 02/22/2015  INTERVAL HISTORY: Andrea [name] 44 y.o. female returns to the clinic today for follow-up visit. She is feeling fine with no specific complaints except for the persistent pain in the left iliac area, but improved since last visit. She can now ambulate without crutches, with minimal difficulty. She is currently on oxycodone 10 mg tablet and she takes 2 tablets every 6 hours with good response. She denied having any significant chest pain, shortness of breath, cough or hemoptysis. She has no significant weight loss or night sweats. She has no headache or visual changes. No bleeding issues noted. She is somewhat anxious in anticipation for treatment. She is here for initiation of C1 D1 chemotherapy with Pemetrexed (Alimta) and Carboplatin. She is going to receive chemo class before this treatment.   MEDICAL HISTORY: Past Medical History  Diagnosis Date  . Low back pain radiating to left leg 11/29/2010  . Dysmenorrhea 12/06/2009    Qualifier: Diagnosis of  By: [name] RN, Regino Schultze    . Chronic back pain   . Family history of adverse reaction to anesthesia     mother stopped breathing after surgery  . Shortness of breath dyspnea     with exertion  . Anxiety   . Depression   . Headache     stress  . Anginal pain ([place])    chest pain not sure what related to  . S/P radiation therapy 01/01/2015 through 01/21/2015      Left iliac bone 3500 cGy in 14 sessions     ALLERGIES:  is allergic to morphine and related and hydrocodone.  MEDICATIONS:  Current Outpatient Prescriptions  Medication Sig Dispense Refill  . cyanocobalamin 500 MCG tablet Take 500 mcg by mouth daily.    [name] dexamethasone (DECADRON) 4 MG tablet 4 mg po bid, the day before, day of and day after chemo (Patient not taking: Reported on 02/16/2015) 40 tablet 1  . feeding supplement, ENSURE ENLIVE, (ENSURE ENLIVE) LIQD Take 237 mLs by mouth 3 (three) times daily between meals. 568 mL 12  . folic acid (FOLVITE) 1 MG tablet Take 1 tablet (1 mg total) by mouth daily. 30 tablet 4  . Multiple Vitamins-Minerals (MULTIVITAMIN & MINERAL PO) Take 2 tablets by mouth daily.     . Oxycodone HCl 10 MG TABS Take 1 tablet (10 mg total) by mouth every 4 (four) hours as needed (severe pain). 90 tablet 0  . polyethylene glycol (MIRALAX / GLYCOLAX) packet Take 17 g by mouth daily as needed for mild constipation. (Patient not taking: Reported on 02/08/2015) 14 each 0  . Prenatal Vit-Fe Fumarate-FA (PRENATAL VITAMIN PO) Take 1 tablet by mouth daily.    . prochlorperazine (COMPAZINE) 10 MG tablet Take 1 tablet (10 mg total) by mouth every 6 (six) hours as needed for nausea or vomiting. 60 tablet 0  .  senna-docusate (SENOKOT-S) 8.6-50 MG per tablet Take 2 tablets by mouth at bedtime. 30 tablet 0   No current facility-administered medications for this visit.    SURGICAL HISTORY:  Past Surgical History  Procedure Laterality Date  . Laproscopy surgery to check her tubes    . Lumbar laminectomy/decompression microdiscectomy Left 05/01/2014    Procedure: Microdiscectomy - L5-S1 - left;  Surgeon: Eustace Moore, MD;  Location: [facility];  Service: Neurosurgery;  Laterality: Left;    REVIEW OF SYSTEMS:   Constitutional: positive for mild left iliac pain controlled with meds,  Eyes: negative Ears, nose, mouth, throat, and face: negative Respiratory: negative Cardiovascular: negative Gastrointestinal: negative Genitourinary:negative Integument/breast: negative Hematologic/lymphatic: negative Musculoskeletal:positive for bone pain and muscle weakness, improved since last visit Neurological: negative Behavioral/Psych: negative Endocrine: negative Allergic/Immunologic: negative   PHYSICAL EXAMINATION: General appearance: alert, cooperative, and no distress Head: Normocephalic, without obvious abnormality, atraumatic Neck: no adenopathy, no JVD, supple, symmetrical, trachea midline and thyroid not enlarged, symmetric, no tenderness/mass/nodules Lymph nodes: Cervical, supraclavicular, and axillary nodes normal. Resp: clear to auscultation bilaterally Back: symmetric, no curvature. ROM normal. No CVA tenderness. Cardio: regular rate and rhythm, S1, S2 normal, no murmur, click, rub or gallop GI: soft, non-tender; bowel sounds normal; no masses,  no organomegaly Extremities: extremities normal, atraumatic, no cyanosis or edema Neurologic: Alert and oriented X 3, normal strength and tone. Normal symmetric reflexes. Normal coordination and gait  ECOG PERFORMANCE STATUS: 1 - Symptomatic but completely ambulatory  Blood pressure 109/84, pulse 108, temperature 97.7 F (36.5 C), temperature source Oral, resp. rate 20, height 5' 6"  (1.676 m), weight 124 lb 8 oz (56.473 kg), SpO2 99 %.  CBC Latest Ref Rng 02/22/2015 02/08/2015 01/25/2015  WBC 3.9 - 10.3 10e3/uL 5.0 4.3 4.0  Hemoglobin 11.6 - 15.9 g/dL 12.7 13.5 12.2  Hematocrit 34.8 - 46.6 % 36.6 40.0 36.0  Platelets 145 - 400 10e3/uL 422(H) 290 426(H)     CMP Latest Ref Rng 02/08/2015 01/25/2015 12/31/2014  Glucose 70 - 140 mg/dl 108 112 100(H)  BUN 7.0 - 26.0 mg/dL 7.4 6.9(L) <5(L)  Creatinine 0.6 - 1.1 mg/dL 0.9 0.8 0.68  Sodium 136 - 145  mEq/L 139 139 136  Potassium 3.5 - 5.1 mEq/L 3.7 3.8 3.6  Chloride 101 - 111 mmol/L - - 105  CO2 22 - 29 mEq/L 24 22 23   Calcium 8.4 - 10.4 mg/dL 10.5(H) 9.9 9.2  Total Protein 6.4 - 8.3 g/dL 8.7(H) 7.8 -  Total Bilirubin 0.20 - 1.20 mg/dL 0.63 0.30 -  Alkaline Phos 40 - 150 U/L 94 85 -  AST 5 - 34 U/L 13 13 -  ALT 0 - 55 U/L 16 18 -     RADIOGRAPHIC STUDIES: No results found.  ASSESSMENT AND PLAN: This is a very pleasant 44 years old African-American female recently diagnosed with   metastatic non-small cell lung cancer, adenocarcinoma with negative EGFR mutation status post palliative radiotherapy to the left iliac bone and soft tissue mass. Blood test sent to [facility] for full panel of mutation testing. Gave her again the option of palliative care and hospice referral versus consideration of palliative systemic chemotherapy with carboplatin for AUC of 5 and Alimta 500 MG/M2 every 3 weeks. Discussed with the patient adverse effects of this treatment including but not limited to alopecia, myelosuppression, nausea and vomiting, peripheral neuropathy, liver or renal dysfunction. She wished to wait until today before starting the first cycle of her chemotherapy because she was waiting on her social  services check to move to a different apartment.  She is expected to start the first cycle of her treatment on 02/22/2015. The patient received vitamin B 12 injection on 10/17 She received a prescription for Compazine 10 mg by mouth every 6 hours as needed for nausea, Decadron 4 mg by mouth twice a day the day before, day of and day after the chemotherapy in addition to folic acid 1 mg by mouth daily. For pain management, the patient will continue with her current pain medication with oxycodone and she was giving a refill of her medication.[name] She was advised to call immediately if she has any concerning symptoms in the interval. The patient voices understanding of current disease status and  treatment options and is in agreement with the current care plan. Labs are adequate She is ready to begin D1 C1 chemo with Alimta and Carboplatin today and every 21 days. She will return in 1 week to the [facility] for follow up with labs  All questions were answered. The patient knows to call the clinic with any problems, questions or concerns. We can certainly see the patient much sooner if necessary.  Spent 15 minutes counseling the patient face to face. The total time spent in the appointment was 25 minutes.  Disclaimer: This note was dictated with voice recognition software. Similar sounding words can inadvertently be transcribed and may not be corrected upon review.  ADDENDUM: Hematology/Oncology Attending: I had a face to face encounter with the patient today. I recommended her care plan. This is a very pleasant 44 years old African-American female with a stage IV non-small cell lung cancer, adenocarcinoma status post palliative radiotherapy to the left iliac bone with significant improvement in her pain and she came to the clinic walking today without crutches or a wheelchair. She continues to have pain in that area but much improved. She is here today to start the first cycle of her systemic chemotherapy was carboplatin and Alimta. The patient missed the appointment for her chemotherapy education class and we will arrange for this to be done today before her first dose of the treatment. We will arrange for the patient to come back for follow-up visit in one week for reevaluation and management of any adverse effect of her treatment. The patient was advised to call immediately if she has any concerning symptoms in the interval.  Disclaimer: This note was dictated with voice recognition software. Similar sounding words can inadvertently be transcribed and may be missed upon review. Eilleen Kempf., MD 02/22/2015

## 2015-02-23 ENCOUNTER — Encounter: Payer: Self-pay | Admitting: *Deleted

## 2015-02-23 NOTE — Progress Notes (Signed)
Oncology Nurse Navigator Documentation  Oncology Nurse Navigator Flowsheets 02/23/2015  Navigator Encounter Type Treatment/spoke with [name] during her first treatment.  She is doing well.  I listened as she explained her pain is controled with pain medication with tylenol.  I went over side effects of her chemo and to call if she needed.   Patient Visit Type Follow-up  Treatment Phase First Chemo Tx  Barriers/Navigation Needs Education  Education Other  Interventions -  Coordination of Care -  Time Spent with Patient 30

## 2015-02-24 ENCOUNTER — Telehealth: Payer: Self-pay | Admitting: Medical Oncology

## 2015-02-24 ENCOUNTER — Other Ambulatory Visit: Payer: Self-pay | Admitting: Medical Oncology

## 2015-02-24 DIAGNOSIS — I878 Other specified disorders of veins: Secondary | ICD-10-CM

## 2015-02-24 NOTE — Telephone Encounter (Signed)
Pt called back and is doing well after chemo . She has a "bad taste in mouth" .She attributes it to folic acid because it started after she started Folic acid. I told her she doesn't need to take anymore.  She is still unable to sit -she has to lay on side to keep pressure off sacrum and pelvis. She is able to perform ADLS but has to plan to use crutches or cane in grocery store or appts  because of increased pain with walking.

## 2015-02-24 NOTE — Telephone Encounter (Signed)
I left message to return call to see how she is doing after chemo . I also told her I  still have paperwork about "discharge of federal loan"-not completed.

## 2015-02-25 ENCOUNTER — Other Ambulatory Visit: Payer: Self-pay | Admitting: Medical Oncology

## 2015-02-25 ENCOUNTER — Telehealth: Payer: Self-pay | Admitting: Medical Oncology

## 2015-02-25 ENCOUNTER — Telehealth: Payer: Self-pay

## 2015-02-25 DIAGNOSIS — C3490 Malignant neoplasm of unspecified part of unspecified bronchus or lung: Secondary | ICD-10-CM

## 2015-02-25 NOTE — Telephone Encounter (Signed)
Faxed lab order to outpt procedure ept -[facility]

## 2015-02-25 NOTE — Telephone Encounter (Signed)
Call from lab stating the results of [facility] lab will be delayed until approx 11/10. The lab is running extra quality checks for accuracy of results.

## 2015-02-25 NOTE — Telephone Encounter (Signed)
I called pt to pick up form to be discharged from repaying student loan. Form put up front at [employer].

## 2015-02-26 ENCOUNTER — Other Ambulatory Visit: Payer: Self-pay | Admitting: *Deleted

## 2015-02-26 DIAGNOSIS — C3490 Malignant neoplasm of unspecified part of unspecified bronchus or lung: Secondary | ICD-10-CM

## 2015-03-01 ENCOUNTER — Encounter: Payer: Self-pay | Admitting: Internal Medicine

## 2015-03-01 ENCOUNTER — Other Ambulatory Visit: Payer: No Typology Code available for payment source

## 2015-03-01 ENCOUNTER — Telehealth: Payer: Self-pay | Admitting: Internal Medicine

## 2015-03-01 ENCOUNTER — Ambulatory Visit (HOSPITAL_BASED_OUTPATIENT_CLINIC_OR_DEPARTMENT_OTHER): Payer: No Typology Code available for payment source | Admitting: Internal Medicine

## 2015-03-01 ENCOUNTER — Other Ambulatory Visit (HOSPITAL_BASED_OUTPATIENT_CLINIC_OR_DEPARTMENT_OTHER): Payer: No Typology Code available for payment source

## 2015-03-01 ENCOUNTER — Encounter: Payer: Self-pay | Admitting: *Deleted

## 2015-03-01 VITALS — BP 105/71 | HR 126 | Temp 98.1°F | Resp 20 | Ht 66.0 in | Wt 125.3 lb

## 2015-03-01 DIAGNOSIS — C3490 Malignant neoplasm of unspecified part of unspecified bronchus or lung: Secondary | ICD-10-CM

## 2015-03-01 DIAGNOSIS — R52 Pain, unspecified: Secondary | ICD-10-CM

## 2015-03-01 DIAGNOSIS — C7951 Secondary malignant neoplasm of bone: Secondary | ICD-10-CM

## 2015-03-01 DIAGNOSIS — R110 Nausea: Secondary | ICD-10-CM

## 2015-03-01 DIAGNOSIS — C7989 Secondary malignant neoplasm of other specified sites: Secondary | ICD-10-CM

## 2015-03-01 LAB — COMPREHENSIVE METABOLIC PANEL (CC13)
ALT: 23 U/L (ref 0–55)
ANION GAP: 11 meq/L (ref 3–11)
AST: 19 U/L (ref 5–34)
Albumin: 3.7 g/dL (ref 3.5–5.0)
Alkaline Phosphatase: 113 U/L (ref 40–150)
BUN: 9.4 mg/dL (ref 7.0–26.0)
CALCIUM: 10.5 mg/dL — AB (ref 8.4–10.4)
CHLORIDE: 103 meq/L (ref 98–109)
CO2: 25 mEq/L (ref 22–29)
Creatinine: 0.9 mg/dL (ref 0.6–1.1)
EGFR: >90 mL/min/{1.73_m2} (ref >90)
Glucose: 91 mg/dl (ref 70–140)
POTASSIUM: 4 meq/L (ref 3.5–5.1)
Sodium: 139 mEq/L (ref 136–145)
Total Bilirubin: 0.44 mg/dL (ref 0.20–1.20)
Total Protein: 8.6 g/dL — ABNORMAL HIGH (ref 6.4–8.3)

## 2015-03-01 LAB — CBC WITH DIFFERENTIAL/PLATELET
BASO%: 1.5 % (ref 0.0–2.0)
Basophils Absolute: 0 10*3/uL (ref 0.0–0.1)
EOS%: 1.9 % (ref 0.0–7.0)
Eosinophils Absolute: 0 10*3/uL (ref 0.0–0.5)
HEMATOCRIT: 40.1 % (ref 34.8–46.6)
HGB: 13.4 g/dL (ref 11.6–15.9)
LYMPH%: 33.6 % (ref 14.0–49.7)
MCH: 31.6 pg (ref 25.1–34.0)
MCHC: 33.4 g/dL (ref 31.5–36.0)
MCV: 94.4 fL (ref 79.5–101.0)
MONO#: 0.2 10*3/uL (ref 0.1–0.9)
MONO%: 7.6 % (ref 0.0–14.0)
NEUT#: 1.3 10*3/uL — ABNORMAL LOW (ref 1.5–6.5)
NEUT%: 55.4 % (ref 38.4–76.8)
PLATELETS: 392 10*3/uL (ref 145–400)
RBC: 4.25 10*6/uL (ref 3.70–5.45)
RDW: 13.7 % (ref 11.2–14.5)
WBC: 2.4 10*3/uL — AB (ref 3.9–10.3)
lymph#: 0.8 10*3/uL — ABNORMAL LOW (ref 0.9–3.3)

## 2015-03-01 MED ORDER — DEXAMETHASONE 4 MG PO TABS: ORAL_TABLET | ORAL | Status: DC

## 2015-03-01 NOTE — Progress Notes (Signed)
[facility] [phone]) [phone]   [phone]) [phone]  OFFICE PROGRESS NOTE  [name], MD [place] [address]  DIAGNOSIS: Stage IV (T1a, N2, M1b) non-small cell lung cancer, adenocarcinoma with negative EGFR mutation diagnosed in August 2016 and presented with right hilar and subcarinal lymphadenopathy as well as large soft tissue mass in the left posterior iliac bone.  PRIOR THERAPY: Status post palliative radiotherapy to the soft tissue mass in the left iliac bone under the care of Dr. Valere Dross  CURRENT THERAPY: Systemic chemotherapy with carboplatin for AUC of 5 and Alimta 500 MG/M2 every 3 weeks. First dose expected on 02/22/2015. Status post one cycle.  INTERVAL HISTORY: Andrea [name] 44 y.o. female returns to the clinic today for follow-up visit. She is feeling fine with no specific complaints except for the persistent pain in the left iliac area. The patient mentions that she lost her pain medication recently. She tolerated the first cycle of her systemic chemotherapy with carboplatin and Alimta fairly well except for few episodes of nausea. She denied having any significant chest pain, shortness of breath, cough or hemoptysis. She has no significant weight loss or night sweats. She has no headache or visual changes.   MEDICAL HISTORY: Past Medical History  Diagnosis Date  . Low back pain radiating to left leg 11/29/2010  . Dysmenorrhea 12/06/2009    Qualifier: Diagnosis of  By: [name] RN, Regino Schultze    . Chronic back pain   . Family history of adverse reaction to anesthesia     mother stopped breathing after surgery  . Shortness of breath dyspnea     with exertion  . Anxiety   . Depression   . Headache     stress  . Anginal pain ([place])     chest pain not sure what related to  . S/P radiation therapy 01/01/2015 through 01/21/2015      Left iliac bone 3500 cGy in 14  sessions     ALLERGIES:  is allergic to morphine and related and hydrocodone.  MEDICATIONS:  Current Outpatient Prescriptions  Medication Sig Dispense Refill  . cyanocobalamin 500 MCG tablet Take 500 mcg by mouth daily.    [name] dexamethasone (DECADRON) 4 MG tablet 4 mg po bid, the day before, day of and day after chemo (Patient not taking: Reported on 02/16/2015) 40 tablet 1  . feeding supplement, ENSURE ENLIVE, (ENSURE ENLIVE) LIQD Take 237 mLs by mouth 3 (three) times daily between meals. 845 mL 12  . folic acid (FOLVITE) 1 MG tablet Take 1 tablet (1 mg total) by mouth daily. 30 tablet 4  . Multiple Vitamins-Minerals (MULTIVITAMIN & MINERAL PO) Take 2 tablets by mouth daily.     . Oxycodone HCl 10 MG TABS Take 1 tablet (10 mg total) by mouth every 4 (four) hours as needed (severe pain). 90 tablet 0  . polyethylene glycol (MIRALAX / GLYCOLAX) packet Take 17 g by mouth daily as needed for mild constipation. (Patient not taking: Reported on 02/08/2015) 14 each 0  . Prenatal Vit-Fe Fumarate-FA (PRENATAL VITAMIN PO) Take 1 tablet by mouth daily.    . prochlorperazine (COMPAZINE) 10 MG tablet Take 1 tablet (10 mg total) by mouth every 6 (six) hours as needed for nausea or vomiting. 60 tablet 0  . senna-docusate (SENOKOT-S) 8.6-50 MG per tablet Take 2 tablets by mouth at bedtime. 30 tablet 0   No current facility-administered medications for this visit.    SURGICAL HISTORY:  Past Surgical History  Procedure Laterality Date  . Laproscopy surgery to check her tubes    . Lumbar laminectomy/decompression microdiscectomy Left 05/01/2014    Procedure: Microdiscectomy - L5-S1 - left;  Surgeon: Eustace Moore, MD;  Location: [facility];  Service: Neurosurgery;  Laterality: Left;    REVIEW OF SYSTEMS:  A comprehensive review of systems was negative except for: Gastrointestinal: positive for nausea Musculoskeletal: positive for bone pain   PHYSICAL EXAMINATION: General appearance: alert,  cooperative, fatigued and no distress Head: Normocephalic, without obvious abnormality, atraumatic Neck: no adenopathy, no JVD, supple, symmetrical, trachea midline and thyroid not enlarged, symmetric, no tenderness/mass/nodules Lymph nodes: Cervical, supraclavicular, and axillary nodes normal. Resp: clear to auscultation bilaterally Back: symmetric, no curvature. ROM normal. No CVA tenderness. Cardio: regular rate and rhythm, S1, S2 normal, no murmur, click, rub or gallop GI: soft, non-tender; bowel sounds normal; no masses,  no organomegaly Extremities: extremities normal, atraumatic, no cyanosis or edema Neurologic: Alert and oriented X 3, normal strength and tone. Normal symmetric reflexes. Normal coordination and gait  ECOG PERFORMANCE STATUS: 1 - Symptomatic but completely ambulatory  There were no vitals taken for this visit.  LABORATORY DATA: Lab Results  Component Value Date   WBC 5.0 02/22/2015   HGB 12.7 02/22/2015   HCT 36.6 02/22/2015   MCV 92.7 02/22/2015   PLT 422* 02/22/2015      Chemistry      Component Value Date/Time   NA 137 02/22/2015 0943   NA 136 12/31/2014 0400   K 3.9 02/22/2015 0943   K 3.6 12/31/2014 0400   CL 105 12/31/2014 0400   CO2 23 02/22/2015 0943   CO2 23 12/31/2014 0400   BUN 7.6 02/22/2015 0943   BUN <5* 12/31/2014 0400   CREATININE 0.8 02/22/2015 0943   CREATININE 0.68 12/31/2014 0400   CREATININE 0.85 01/08/2014 1637      Component Value Date/Time   CALCIUM 10.9* 02/22/2015 0943   CALCIUM 9.2 12/31/2014 0400   ALKPHOS 94 02/22/2015 0943   ALKPHOS 74 04/20/2013 1320   AST 15 02/22/2015 0943   AST 17 04/20/2013 1320   ALT 14 02/22/2015 0943   ALT 14 04/20/2013 1320   BILITOT <0.30 02/22/2015 0943   BILITOT 0.5 04/20/2013 1320       RADIOGRAPHIC STUDIES: No results found.  ASSESSMENT AND PLAN: This is a very pleasant 44 years old African-American female recently diagnosed with metastatic non-small cell lung cancer,  adenocarcinoma with negative EGFR mutation status post palliative radiotherapy to the left iliac bone and soft tissue mass. She is currently undergoing systemic chemotherapy with carboplatin and Alimta status post 1 cycle. She tolerated the first week of her treatment well except for mild nausea. I recommended for the patient to continue her treatment as scheduled and she will come back for follow-up visit in 2 weeks for evaluation before starting cycle #2. For pain management, the patient will take Tylenol for now until it's time for refill of her pain medication with oxycodone.  She was advised to call immediately if she has any concerning symptoms in the interval. The patient voices understanding of current disease status and treatment options and is in agreement with the current care plan.  All questions were answered. The patient knows to call the clinic with any problems, questions or concerns. We can certainly see the patient much sooner if necessary.  Disclaimer: This note was dictated with voice recognition software. Similar sounding words can inadvertently be transcribed and may not  be corrected upon review.

## 2015-03-01 NOTE — Telephone Encounter (Signed)
Gave patient avs report and appointments for November 2016 and January 2017.

## 2015-03-01 NOTE — Progress Notes (Signed)
Oncology Nurse Navigator Documentation  Oncology Nurse Navigator Flowsheets 03/01/2015  Navigator Encounter Type Clinic/MDC/spoke with patient today at clinic. She states she is doing well without complaints.   Dr. Julien Nordmann requested I call [facility] to request lab results.  I did.  I was told lab results will be completed/ersulted out today.  Dr. Julien Nordmann aware.   Patient Visit Type Follow-up  Treatment Phase Treatment  Interventions Coordination of Care  Coordination of Care -  Time Spent with Patient 30

## 2015-03-04 ENCOUNTER — Other Ambulatory Visit: Payer: Self-pay | Admitting: Radiology

## 2015-03-05 ENCOUNTER — Telehealth: Payer: Self-pay | Admitting: Licensed Clinical Social Worker

## 2015-03-05 ENCOUNTER — Other Ambulatory Visit: Payer: Self-pay | Admitting: *Deleted

## 2015-03-05 LAB — GUARDANT 360

## 2015-03-05 NOTE — Telephone Encounter (Signed)
CSW received call from Ms. Evangelist pt inquiring about Medicaid transportation options and how to arrange.  Pt states she has been approved for [place] medicaid. CSW returned call to Ms. Carvin and informed pt of the steps to obtain and schedule Medicaid medical transportation.  Phone number provided, [phone], pt aware she may need to contact M/A caseworker for transportation assessment.  CSW will mail Ms. Kage brochure from [place] transportation services.

## 2015-03-08 ENCOUNTER — Other Ambulatory Visit: Payer: No Typology Code available for payment source

## 2015-03-08 ENCOUNTER — Other Ambulatory Visit: Payer: Self-pay | Admitting: Internal Medicine

## 2015-03-08 ENCOUNTER — Encounter (HOSPITAL_COMMUNITY): Payer: Self-pay

## 2015-03-08 ENCOUNTER — Ambulatory Visit (HOSPITAL_COMMUNITY)
Admission: RE | Admit: 2015-03-08 | Discharge: 2015-03-08 | Disposition: A | Discharge status: Home / Self Care | Payer: Medicaid Other | Source: Ambulatory Visit | Attending: Internal Medicine | Admitting: Internal Medicine

## 2015-03-08 ENCOUNTER — Telehealth: Payer: Self-pay | Admitting: Oncology

## 2015-03-08 ENCOUNTER — Telehealth: Payer: Self-pay | Admitting: *Deleted

## 2015-03-08 DIAGNOSIS — C3490 Malignant neoplasm of unspecified part of unspecified bronchus or lung: Secondary | ICD-10-CM

## 2015-03-08 DIAGNOSIS — I878 Other specified disorders of veins: Secondary | ICD-10-CM

## 2015-03-08 DIAGNOSIS — Z79899 Other long term (current) drug therapy: Secondary | ICD-10-CM | POA: Insufficient documentation

## 2015-03-08 LAB — CBC
HCT: 33.1 % — ABNORMAL LOW (ref 36.0–46.0)
Hemoglobin: 11.1 g/dL — ABNORMAL LOW (ref 12.0–15.0)
MCH: 32.4 pg (ref 26.0–34.0)
MCHC: 33.5 g/dL (ref 30.0–36.0)
MCV: 96.5 fL (ref 78.0–100.0)
PLATELETS: 225 10*3/uL (ref 150–400)
RBC: 3.43 MIL/uL — ABNORMAL LOW (ref 3.87–5.11)
RDW: 14.9 % (ref 11.5–15.5)
WBC: 2.9 10*3/uL — AB (ref 4.0–10.5)

## 2015-03-08 LAB — HCG, SERUM, QUALITATIVE: Preg, Serum: NEGATIVE

## 2015-03-08 LAB — BASIC METABOLIC PANEL
Anion gap: 8 (ref 5–15)
BUN: 8 mg/dL (ref 6–20)
CHLORIDE: 111 mmol/L (ref 101–111)
CO2: 22 mmol/L (ref 22–32)
CREATININE: 0.77 mg/dL (ref 0.44–1.00)
Calcium: 9.5 mg/dL (ref 8.9–10.3)
GFR calc Af Amer: >60 mL/min (ref >60)
Glucose, Bld: 92 mg/dL (ref 65–99)
Potassium: 4.2 mmol/L (ref 3.5–5.1)
SODIUM: 141 mmol/L (ref 135–145)

## 2015-03-08 LAB — PROTIME-INR
INR: 0.89 (ref 0.00–1.49)
PROTHROMBIN TIME: 12.2 s (ref 11.6–15.2)

## 2015-03-08 LAB — APTT: APTT: 31 s (ref 24–37)

## 2015-03-08 MED ORDER — CEFAZOLIN SODIUM-DEXTROSE 2-3 GM-% IV SOLR
2.0000 g | INTRAVENOUS | Status: AC
  Administered 2015-03-08: 2 g via INTRAVENOUS

## 2015-03-08 MED ORDER — HEPARIN SOD (PORK) LOCK FLUSH 100 UNIT/ML IV SOLN
INTRAVENOUS | Status: DC
Start: 2015-03-08 — End: 2015-03-09
  Filled 2015-03-08: qty 5

## 2015-03-08 MED ORDER — CEFAZOLIN SODIUM-DEXTROSE 2-3 GM-% IV SOLR
INTRAVENOUS | Status: AC
  Filled 2015-03-08: qty 50

## 2015-03-08 MED ORDER — SODIUM CHLORIDE 0.9 % IV SOLN
Freq: Once | INTRAVENOUS | Status: AC
  Administered 2015-03-08: 11:00:00 via INTRAVENOUS

## 2015-03-08 MED ORDER — LIDOCAINE-EPINEPHRINE 2 %-1:100000 IJ SOLN
INTRAMUSCULAR | Status: AC
  Filled 2015-03-08: qty 1

## 2015-03-08 MED ORDER — ONDANSETRON HCL 4 MG/2ML IJ SOLN
INTRAMUSCULAR | Status: AC
  Administered 2015-03-08: 4 mg via INTRAVENOUS
  Filled 2015-03-08: qty 2

## 2015-03-08 MED ORDER — MIDAZOLAM HCL 2 MG/2ML IJ SOLN
INTRAMUSCULAR | Status: AC | PRN
  Administered 2015-03-08 (×2): 1 mg via INTRAVENOUS

## 2015-03-08 MED ORDER — MIDAZOLAM HCL 2 MG/2ML IJ SOLN
INTRAMUSCULAR | Status: DC
Start: 2015-03-08 — End: 2015-03-09
  Filled 2015-03-08: qty 6

## 2015-03-08 MED ORDER — FENTANYL CITRATE (PF) 100 MCG/2ML IJ SOLN
INTRAMUSCULAR | Status: AC | PRN
  Administered 2015-03-08 (×2): 50 ug via INTRAVENOUS

## 2015-03-08 MED ORDER — FENTANYL CITRATE (PF) 100 MCG/2ML IJ SOLN
INTRAMUSCULAR | Status: AC
  Filled 2015-03-08: qty 6

## 2015-03-08 MED ORDER — LIDOCAINE-PRILOCAINE 2.5-2.5 % EX CREA: TOPICAL_CREAM | CUTANEOUS | Status: DC

## 2015-03-08 NOTE — Telephone Encounter (Signed)
Opened in error

## 2015-03-08 NOTE — Procedures (Signed)
Successful placement of right IJ approach port-a-cath with tip at the superior caval atrial junction. The catheter is ready for immediate use. No immediate post procedural complications.  Ronny Bacon, MD Pager #: [phone]

## 2015-03-08 NOTE — H&P (Signed)
HPI: Patient with stage IV non-small cell lung cancer who has been seen by Dr. Julien Nordmann on 03/01/15 and has started chemotherapy, next chemotherapy is November 21st. She has been scheduled today for image guided port a catheter placement for chemotherapy. She denies any recent infections, fever or chills.   The patient has had a H&P performed within the last 30 days, all history, medications, and exam have been reviewed. The patient denies any interval changes since the H&P.  Medications: Prior to Admission medications   Medication Sig Start Date End Date Taking? Authorizing Provider  senna-docusate (SENOKOT-S) 8.6-50 MG per tablet Take 2 tablets by mouth at bedtime. 01/04/15  Yes Debbe Odea, MD  cyanocobalamin 500 MCG tablet Take 500 mcg by mouth daily.    Historical Provider, MD  dexamethasone (DECADRON) 4 MG tablet 4 mg po bid, the day before, day of and day after chemo 03/01/15   Curt Bears, MD  feeding supplement, ENSURE ENLIVE, (ENSURE ENLIVE) LIQD Take 237 mLs by mouth 3 (three) times daily between meals. 01/04/15   Debbe Odea, MD  folic acid (FOLVITE) 1 MG tablet Take 1 tablet (1 mg total) by mouth daily. 02/08/15   Curt Bears, MD  Multiple Vitamins-Minerals (MULTIVITAMIN & MINERAL PO) Take 2 tablets by mouth daily.     Historical Provider, MD  Oxycodone HCl 10 MG TABS Take 1 tablet (10 mg total) by mouth every 4 (four) hours as needed (severe pain). Patient not taking: Reported on 03/01/2015 02/08/15   Curt Bears, MD  polyethylene glycol [facility] Floria Raveling) packet Take 17 g by mouth daily as needed for mild constipation. 01/04/15   Debbe Odea, MD  Prenatal Vit-Fe Fumarate-FA (PRENATAL VITAMIN PO) Take 1 tablet by mouth daily.    Historical Provider, MD  prochlorperazine (COMPAZINE) 10 MG tablet Take 1 tablet (10 mg total) by mouth every 6 (six) hours as needed for nausea or vomiting. 02/08/15   Curt Bears, MD     Vital Signs: LMP 07/24/2014 T: 97.9 F, HR: 57 bpm,  BP: 98/55 mmHg, O2: 100% RA   Physical Exam  Constitutional: She is oriented to person, place, and time. No distress.  Cardiovascular: Normal rate and regular rhythm.  Exam reveals no gallop and no friction rub.   No murmur heard. Pulmonary/Chest: Effort normal and breath sounds normal. No respiratory distress. She has no wheezes. She has no rales.  Abdominal: Soft. Bowel sounds are normal. She exhibits no distension. There is no tenderness.  Neurological: She is alert and oriented to person, place, and time.  Skin: Skin is warm and dry. She is not diaphoretic.    Mallampati Score:  MD Evaluation Airway: WNL Heart: WNL Abdomen: WNL Chest/ Lungs: WNL ASA  Classification: 3 Mallampati/Airway Score: Two  Labs:  CBC:  Recent Labs  02/08/15 1058 02/22/15 0943 03/01/15 1052 03/08/15 1100  WBC 4.3 5.0 2.4* 2.9*  HGB 13.5 12.7 13.4 11.1*  HCT 40.0 36.6 40.1 33.1*  PLT 290 422* 392 225    COAGS:  Recent Labs  12/21/14 0812 03/08/15 1100  INR 1.12 0.89  APTT 36 31    BMP:  Recent Labs  12/30/14 0405 12/31/14 0400  02/08/15 1058 02/22/15 0943 03/01/15 1052 03/08/15 1100  NA 136 136  < > 139 137 139 141  K 3.4* 3.6  < > 3.7 3.9 4.0 4.2  CL 106 105  --   --   --   --  111  CO2 23 23  < > 24  $'23 25 22  'g$ GLUCOSE 93 100*  < > 108 131 91 92  BUN 6 <5*  < > 7.4 7.6 9.4 8  CALCIUM 9.2 9.2  < > 10.5* 10.9* 10.5* 9.5  CREATININE 0.64 0.68  < > 0.9 0.8 0.9 0.77  GFRNONAA >60 >60  --   --   --   --  >60  GFRAA >60 >60  --   --   --   --  >60  < > = values in this interval not displayed.  LIVER FUNCTION TESTS:  Recent Labs  01/25/15 1500 02/08/15 1058 02/22/15 0943 03/01/15 1052  BILITOT 0.30 0.63 <0.30 0.44  AST '13 13 15 19  '$ ALT '18 16 14 23  '$ ALKPHOS 85 94 94 113  PROT 7.8 8.7* 8.7* 8.6*  ALBUMIN 3.3* 3.8 3.5 3.7    Assessment/Plan:  Stage IV non-small cell lung cancer Seen by Dr. Julien Nordmann on 03/01/15 Scheduled today for image guided port a catheter  placement for chemotherapy  The patient has been NPO, no blood thinners taken, labs and vitals have been reviewed. Risks and Benefits discussed with the patient including, but not limited to bleeding, infection, pneumothorax, or fibrin sheath development and need for additional procedures. All of the patient's questions were answered, patient is agreeable to proceed. Consent signed and in chart.   SignedHedy Jacob 03/08/2015, 12:08 PM

## 2015-03-08 NOTE — Telephone Encounter (Signed)
"  I have just had a new port-a-cath placed and was told to have Dr. Julien Nordmann order Emla cream for this.  I use [facility] on [facility]"  This nurse reviewed use of Emla Cream and will send order in.  P.O.F. generated because she also said she "wants port-a-cath used for all lab draw".

## 2015-03-08 NOTE — Discharge Instructions (Signed)
Implanted Port Home Guide °An implanted port is a type of central line that is placed under the skin. Central lines are used to provide IV access when treatment or nutrition needs to be given through a person's veins. Implanted ports are used for long-term IV access. An implanted port may be placed because:  °· You need IV medicine that would be irritating to the small veins in your hands or arms.   °· You need long-term IV medicines, such as antibiotics.   °· You need IV nutrition for a long period.   °· You need frequent blood draws for lab tests.   °· You need dialysis.   °Implanted ports are usually placed in the chest area, but they can also be placed in the upper arm, the abdomen, or the leg. An implanted port has two main parts:  °· Reservoir. The reservoir is round and will appear as a small, raised area under your skin. The reservoir is the part where a needle is inserted to give medicines or draw blood.   °· Catheter. The catheter is a thin, flexible tube that extends from the reservoir. The catheter is placed into a large vein. Medicine that is inserted into the reservoir goes into the catheter and then into the vein.   °HOW WILL I CARE FOR MY INCISION SITE? °Do not get the incision site wet. Bathe or shower as directed by your health care provider.  °HOW IS MY PORT ACCESSED? °Special steps must be taken to access the port:  °· Before the port is accessed, a numbing cream can be placed on the skin. This helps numb the skin over the port site.   °· Your health care provider uses a sterile technique to access the port. °· Your health care provider must put on a mask and sterile gloves. °· The skin over your port is cleaned carefully with an antiseptic and allowed to dry. °· The port is gently pinched between sterile gloves, and a needle is inserted into the port. °· Only "non-coring" port needles should be used to access the port. Once the port is accessed, a blood return should be checked. This helps  ensure that the port is in the vein and is not clogged.   °· If your port needs to remain accessed for a constant infusion, a clear (transparent) bandage will be placed over the needle site. The bandage and needle will need to be changed every week, or as directed by your health care provider.   °· Keep the bandage covering the needle clean and dry. Do not get it wet. Follow your health care provider's instructions on how to take a shower or bath while the port is accessed.   °· If your port does not need to stay accessed, no bandage is needed over the port.   °WHAT IS FLUSHING? °Flushing helps keep the port from getting clogged. Follow your health care provider's instructions on how and when to flush the port. Ports are usually flushed with saline solution or a medicine called heparin. The need for flushing will depend on how the port is used.  °· If the port is used for intermittent medicines or blood draws, the port will need to be flushed:   °· After medicines have been given.   °· After blood has been drawn.   °· As part of routine maintenance.   °· If a constant infusion is running, the port may not need to be flushed.   °HOW LONG WILL MY PORT STAY IMPLANTED? °The port can stay in for as long as your health care   provider thinks it is needed. When it is time for the port to come out, surgery will be done to remove it. The procedure is similar to the one performed when the port was put in.  WHEN SHOULD I SEEK IMMEDIATE MEDICAL CARE? When you have an implanted port, you should seek immediate medical care if:   You notice a bad smell coming from the incision site.   You have swelling, redness, or drainage at the incision site.   You have more swelling or pain at the port site or the surrounding area.   You have a fever that is not controlled with medicine.   This information is not intended to replace advice given to you by your health care provider. Make sure you discuss any questions you have with  your health care provider.   Document Released: 04/10/2005 Document Revised: 01/29/2013 Document Reviewed: 12/16/2012 Elsevier Interactive Patient Education 2016 [employer]  Moderate Conscious Sedation, Adult, Care After Refer to this sheet in the next few weeks. These instructions provide you with information on caring for yourself after your procedure. Your health care provider may also give you more specific instructions. Your treatment has been planned according to current medical practices, but problems sometimes occur. Call your health care provider if you have any problems or questions after your procedure. WHAT TO EXPECT AFTER THE PROCEDURE  After your procedure:  You may feel sleepy, clumsy, and have poor balance for several hours.  Vomiting may occur if you eat too soon after the procedure. HOME CARE INSTRUCTIONS  Do not participate in any activities where you could become injured for at least 24 hours. Do not:  Drive.  Swim.  Ride a bicycle.  Operate heavy machinery.  Cook.  Use power tools.  Climb ladders.  Work from a high place.  Do not make important decisions or sign legal documents until you are improved.  If you vomit, drink water, juice, or soup when you can drink without vomiting. Make sure you have little or no nausea before eating solid foods.  Only take over-the-counter or prescription medicines for pain, discomfort, or fever as directed by your health care provider.  Make sure you and your family fully understand everything about the medicines given to you, including what side effects may occur.  You should not drink alcohol, take sleeping pills, or take medicines that cause drowsiness for at least 24 hours.  If you smoke, do not smoke without supervision.  If you are feeling better, you may resume normal activities 24 hours after you were sedated.  Keep all appointments with your health care provider. SEEK MEDICAL CARE IF:  Your skin is  pale or bluish in color.  You continue to feel nauseous or vomit.  Your pain is getting worse and is not helped by medicine.  You have bleeding or swelling.  You are still sleepy or feeling clumsy after 24 hours. SEEK IMMEDIATE MEDICAL CARE IF:  You develop a rash.  You have difficulty breathing.  You develop any type of allergic problem.  You have a fever. MAKE SURE YOU:  Understand these instructions.  Will watch your condition.  Will get help right away if you are not doing well or get worse.   This information is not intended to replace advice given to you by your health care provider. Make sure you discuss any questions you have with your health care provider.   Document Released: 01/29/2013 Document Revised: 05/01/2014 Document Reviewed: 01/29/2013 Elsevier Interactive Patient  Education ©2016 [employer] ° °

## 2015-03-09 ENCOUNTER — Telehealth: Payer: Self-pay

## 2015-03-09 NOTE — Telephone Encounter (Signed)
Pt called stating that social security needs a letter stating that the cancer the pt has is terminal. The fax number to Ms Forwards at [facility] is [phone]. The pt looked in phone for number Ms Forwards called from and that is [phone]. The pt would like to pick up a copy of this letter when she comes for OV and chemo on 11/21

## 2015-03-09 NOTE — Telephone Encounter (Signed)
OK to write the letter

## 2015-03-10 NOTE — Telephone Encounter (Signed)
Letter created, copy will be given to pt at next office visit.

## 2015-03-11 ENCOUNTER — Other Ambulatory Visit: Payer: Self-pay | Admitting: *Deleted

## 2015-03-11 DIAGNOSIS — C3490 Malignant neoplasm of unspecified part of unspecified bronchus or lung: Secondary | ICD-10-CM

## 2015-03-15 ENCOUNTER — Ambulatory Visit: Payer: No Typology Code available for payment source

## 2015-03-15 ENCOUNTER — Encounter: Payer: Self-pay | Admitting: Internal Medicine

## 2015-03-15 ENCOUNTER — Other Ambulatory Visit (HOSPITAL_BASED_OUTPATIENT_CLINIC_OR_DEPARTMENT_OTHER): Payer: No Typology Code available for payment source

## 2015-03-15 ENCOUNTER — Ambulatory Visit (HOSPITAL_BASED_OUTPATIENT_CLINIC_OR_DEPARTMENT_OTHER): Payer: No Typology Code available for payment source

## 2015-03-15 ENCOUNTER — Ambulatory Visit (HOSPITAL_BASED_OUTPATIENT_CLINIC_OR_DEPARTMENT_OTHER): Payer: No Typology Code available for payment source | Admitting: Internal Medicine

## 2015-03-15 ENCOUNTER — Telehealth: Payer: Self-pay | Admitting: Internal Medicine

## 2015-03-15 ENCOUNTER — Encounter: Payer: Self-pay | Admitting: *Deleted

## 2015-03-15 ENCOUNTER — Ambulatory Visit: Payer: No Typology Code available for payment source | Admitting: Nutrition

## 2015-03-15 VITALS — BP 130/83 | HR 97 | Temp 97.9°F | Resp 18 | Ht 66.0 in | Wt 131.4 lb

## 2015-03-15 DIAGNOSIS — C3490 Malignant neoplasm of unspecified part of unspecified bronchus or lung: Secondary | ICD-10-CM

## 2015-03-15 DIAGNOSIS — C7951 Secondary malignant neoplasm of bone: Secondary | ICD-10-CM

## 2015-03-15 DIAGNOSIS — Z5111 Encounter for antineoplastic chemotherapy: Secondary | ICD-10-CM

## 2015-03-15 LAB — CBC WITH DIFFERENTIAL/PLATELET
BASO%: 0.7 % (ref 0.0–2.0)
BASOS ABS: 0 10*3/uL (ref 0.0–0.1)
EOS ABS: 0 10*3/uL (ref 0.0–0.5)
EOS%: 0 % (ref 0.0–7.0)
HEMATOCRIT: 36.8 % (ref 34.8–46.6)
HEMOGLOBIN: 12.2 g/dL (ref 11.6–15.9)
LYMPH%: 7.8 % — AB (ref 14.0–49.7)
MCH: 32.5 pg (ref 25.1–34.0)
MCHC: 33.3 g/dL (ref 31.5–36.0)
MCV: 97.7 fL (ref 79.5–101.0)
MONO#: 0.2 10*3/uL (ref 0.1–0.9)
MONO%: 3.1 % (ref 0.0–14.0)
NEUT%: 88.4 % — ABNORMAL HIGH (ref 38.4–76.8)
NEUTROS ABS: 5.3 10*3/uL (ref 1.5–6.5)
PLATELETS: 284 10*3/uL (ref 145–400)
RBC: 3.76 10*6/uL (ref 3.70–5.45)
RDW: 16.1 % — ABNORMAL HIGH (ref 11.2–14.5)
WBC: 6 10*3/uL (ref 3.9–10.3)
lymph#: 0.5 10*3/uL — ABNORMAL LOW (ref 0.9–3.3)

## 2015-03-15 LAB — COMPREHENSIVE METABOLIC PANEL (CC13)
ALBUMIN: 3.7 g/dL (ref 3.5–5.0)
ALK PHOS: 100 U/L (ref 40–150)
ALT: 100 U/L — AB (ref 0–55)
ANION GAP: 13 meq/L — AB (ref 3–11)
AST: 42 U/L — ABNORMAL HIGH (ref 5–34)
BILIRUBIN TOTAL: 0.33 mg/dL (ref 0.20–1.20)
BUN: 10.6 mg/dL (ref 7.0–26.0)
CO2: 18 meq/L — AB (ref 22–29)
CREATININE: 1 mg/dL (ref 0.6–1.1)
Calcium: 10.3 mg/dL (ref 8.4–10.4)
Chloride: 107 mEq/L (ref 98–109)
EGFR: 79 mL/min/{1.73_m2} — AB (ref >90)
Glucose: 176 mg/dl — ABNORMAL HIGH (ref 70–140)
Potassium: 3.7 mEq/L (ref 3.5–5.1)
Sodium: 139 mEq/L (ref 136–145)
TOTAL PROTEIN: 7.6 g/dL (ref 6.4–8.3)

## 2015-03-15 MED ORDER — SODIUM CHLORIDE 0.9 % IV SOLN
Freq: Once | INTRAVENOUS | Status: AC
  Administered 2015-03-15: 13:00:00 via INTRAVENOUS

## 2015-03-15 MED ORDER — PEMETREXED DISODIUM CHEMO INJECTION 500 MG
500.0000 mg/m2 | Freq: Once | INTRAVENOUS | Status: AC
  Administered 2015-03-15: 800 mg via INTRAVENOUS
  Filled 2015-03-15: qty 28

## 2015-03-15 MED ORDER — CARBOPLATIN CHEMO INJECTION 600 MG/60ML
518.5000 mg | Freq: Once | INTRAVENOUS | Status: AC
  Administered 2015-03-15: 520 mg via INTRAVENOUS
  Filled 2015-03-15: qty 52

## 2015-03-15 MED ORDER — SODIUM CHLORIDE 0.9 % IV SOLN
Freq: Once | INTRAVENOUS | Status: AC
  Administered 2015-03-15: 13:00:00 via INTRAVENOUS
  Filled 2015-03-15: qty 8

## 2015-03-15 MED ORDER — SODIUM CHLORIDE 0.9 % IJ SOLN
10.0000 mL | INTRAMUSCULAR | Status: DC | PRN
  Administered 2015-03-15: 10 mL
  Filled 2015-03-15: qty 10

## 2015-03-15 MED ORDER — SODIUM CHLORIDE 0.9 % IJ SOLN
10.0000 mL | INTRAMUSCULAR | Status: DC | PRN
  Administered 2015-03-15: 10 mL via INTRAVENOUS
  Filled 2015-03-15: qty 10

## 2015-03-15 MED ORDER — HEPARIN SOD (PORK) LOCK FLUSH 100 UNIT/ML IV SOLN
500.0000 [IU] | Freq: Once | INTRAVENOUS | Status: AC | PRN
  Administered 2015-03-15: 500 [IU]
  Filled 2015-03-15: qty 5

## 2015-03-15 MED ORDER — OXYCODONE-ACETAMINOPHEN 7.5-325 MG PO TABS: 1.0000 | ORAL_TABLET | Freq: Four times a day (QID) | ORAL | Status: DC | PRN

## 2015-03-15 NOTE — Telephone Encounter (Signed)
Appointments for next 3 weeks per 11/21 pof already on schedule. Patient given avs report and appointments for November thru January.

## 2015-03-15 NOTE — Progress Notes (Signed)
[facility] [phone]   [phone]  OFFICE PROGRESS NOTE  [name], MD [place] [address]  DIAGNOSIS: Stage IV (T1a, N2, M1b) non-small cell lung cancer, adenocarcinoma with negative EGFR mutation diagnosed in August 2016 and presented with right hilar and subcarinal lymphadenopathy as well as large soft tissue mass in the left posterior iliac bone.  PRIOR THERAPY: Status post palliative radiotherapy to the soft tissue mass in the left iliac bone under the care of Dr. Valere Dross  CURRENT THERAPY: Systemic chemotherapy with carboplatin for AUC of 5 and Alimta 500 MG/M2 every 3 weeks. First dose expected on 02/22/2015. Status post one cycle.  INTERVAL HISTORY: Andrea [name] 44 y.o. female returns to the clinic today for follow-up visit accompanied by her father. The patient is feeling much better today and came walking to the clinic. She she continues to have mild pain in the left iliac area.  She tolerated the first cycle of her systemic chemotherapy with carboplatin and Alimta fairly well except for few episodes of nausea a few days after the treatment but none over the last 2 weeks. She denied having any significant chest pain, shortness of breath, cough or hemoptysis. She has no significant weight loss or night sweats. She has no headache or visual changes. She is here today to start cycle #2.  MEDICAL HISTORY: Past Medical History  Diagnosis Date  . Low back pain radiating to left leg 11/29/2010  . Dysmenorrhea 12/06/2009    Qualifier: Diagnosis of  By: [name] RN, Regino Schultze    . Chronic back pain   . Family history of adverse reaction to anesthesia     mother stopped breathing after surgery  . Anxiety   . Depression   . Headache     stress  . S/P radiation therapy 01/01/2015 through 01/21/2015      Left iliac bone 3500 cGy in 14 sessions   . Shortness of  breath dyspnea     with exertion, Pt denies 02/2015  . Anginal pain ([place])     chest pain not sure what related to    ALLERGIES:  is allergic to morphine and related and hydrocodone.  MEDICATIONS:  Current Outpatient Prescriptions  Medication Sig Dispense Refill  . cyanocobalamin 500 MCG tablet Take 500 mcg by mouth daily.    [name] dexamethasone (DECADRON) 4 MG tablet 4 mg po bid, the day before, day of and day after chemo 40 tablet 1  . feeding supplement, ENSURE ENLIVE, (ENSURE ENLIVE) LIQD Take 237 mLs by mouth 3 (three) times daily between meals. 578 mL 12  . folic acid (FOLVITE) 1 MG tablet Take 1 tablet (1 mg total) by mouth daily. 30 tablet 4  . lidocaine-prilocaine (EMLA) cream Apply 1 teaspoon to skin 1 to 2 hours before use cover to keep in contact with skin 30 g PRN  . Multiple Vitamins-Minerals (MULTIVITAMIN & MINERAL PO) Take 2 tablets by mouth daily.     . polyethylene glycol (MIRALAX / GLYCOLAX) packet Take 17 g by mouth daily as needed for mild constipation. 14 each 0  . Prenatal Vit-Fe Fumarate-FA (PRENATAL VITAMIN PO) Take 1 tablet by mouth daily.    . prochlorperazine (COMPAZINE) 10 MG tablet Take 1 tablet (10 mg total) by mouth every 6 (six) hours as needed for nausea or vomiting. 60 tablet 0  . senna-docusate (SENOKOT-S) 8.6-50 MG per tablet Take 2 tablets by mouth at bedtime. 30 tablet 0  .  oxyCODONE-acetaminophen (PERCOCET) 7.5-325 MG tablet Take 1 tablet by mouth every 6 (six) hours as needed for severe pain. 60 tablet 0   No current facility-administered medications for this visit.   Facility-Administered Medications Ordered in Other Visits  Medication Dose Route Frequency Provider Last Rate Last Dose  . sodium chloride 0.9 % injection 10 mL  10 mL Intracatheter PRN Curt Bears, MD   10 mL at 03/15/15 1448    SURGICAL HISTORY:  Past Surgical History  Procedure Laterality Date  . Laproscopy surgery to check her tubes    . Lumbar laminectomy/decompression  microdiscectomy Left 05/01/2014    Procedure: Microdiscectomy - L5-S1 - left;  Surgeon: Eustace Moore, MD;  Location: [facility];  Service: Neurosurgery;  Laterality: Left;    REVIEW OF SYSTEMS:  A comprehensive review of systems was negative except for: Musculoskeletal: positive for bone pain   PHYSICAL EXAMINATION: General appearance: alert, cooperative, fatigued and no distress Head: Normocephalic, without obvious abnormality, atraumatic Neck: no adenopathy, no JVD, supple, symmetrical, trachea midline and thyroid not enlarged, symmetric, no tenderness/mass/nodules Lymph nodes: Cervical, supraclavicular, and axillary nodes normal. Resp: clear to auscultation bilaterally Back: symmetric, no curvature. ROM normal. No CVA tenderness. Cardio: regular rate and rhythm, S1, S2 normal, no murmur, click, rub or gallop GI: soft, non-tender; bowel sounds normal; no masses,  no organomegaly Extremities: extremities normal, atraumatic, no cyanosis or edema Neurologic: Alert and oriented X 3, normal strength and tone. Normal symmetric reflexes. Normal coordination and gait  ECOG PERFORMANCE STATUS: 1 - Symptomatic but completely ambulatory  Blood pressure 130/83, pulse 97, temperature 97.9 F (36.6 C), temperature source Oral, resp. rate 18, height 5' 6"  (1.676 m), weight 131 lb 6.4 oz (59.603 kg), last menstrual period 07/24/2014, SpO2 100 %.  LABORATORY DATA: Lab Results  Component Value Date   WBC 6.0 03/15/2015   HGB 12.2 03/15/2015   HCT 36.8 03/15/2015   MCV 97.7 03/15/2015   PLT 284 03/15/2015      Chemistry      Component Value Date/Time   NA 139 03/15/2015 1116   NA 141 03/08/2015 1100   K 3.7 03/15/2015 1116   K 4.2 03/08/2015 1100   CL 111 03/08/2015 1100   CO2 18* 03/15/2015 1116   CO2 22 03/08/2015 1100   BUN 10.6 03/15/2015 1116   BUN 8 03/08/2015 1100   CREATININE 1.0 03/15/2015 1116   CREATININE 0.77 03/08/2015 1100   CREATININE 0.85 01/08/2014 1637      Component Value  Date/Time   CALCIUM 10.3 03/15/2015 1116   CALCIUM 9.5 03/08/2015 1100   ALKPHOS 100 03/15/2015 1116   ALKPHOS 74 04/20/2013 1320   AST 42* 03/15/2015 1116   AST 17 04/20/2013 1320   ALT 100* 03/15/2015 1116   ALT 14 04/20/2013 1320   BILITOT 0.33 03/15/2015 1116   BILITOT 0.5 04/20/2013 1320       RADIOGRAPHIC STUDIES: Ir Fluoro Guide Cv Line Right  03/08/2015  INDICATION: History of lung cancer. In need of durable intravenous access for chemotherapy administration. EXAM: IMPLANTED PORT A CATH PLACEMENT WITH ULTRASOUND AND FLUOROSCOPIC GUIDANCE COMPARISON:  Chest CT - 12/29/2014 MEDICATIONS: Ancef 2 gm IV; The antibiotic was administered within an appropriate time interval prior to skin puncture. ANESTHESIA/SEDATION: Versed 2 mg IV; Fentanyl 100 mcg IV; Total Moderate Sedation Time 22  minutes. CONTRAST:  None FLUOROSCOPY TIME:  12 seconds (2 mGy) COMPLICATIONS: None immediate PROCEDURE: The procedure, risks, benefits, and alternatives were explained to the patient. Questions regarding  the procedure were encouraged and answered. The patient understands and consents to the procedure. The right neck and chest were prepped with chlorhexidine in a sterile fashion, and a sterile drape was applied covering the operative field. Maximum barrier sterile technique with sterile gowns and gloves were used for the procedure. A timeout was performed prior to the initiation of the procedure. Local anesthesia was provided with 1% lidocaine with epinephrine. After creating a small venotomy incision, a micropuncture kit was utilized to access the internal jugular vein under direct, real-time ultrasound guidance. Ultrasound image documentation was performed. The microwire was kinked to measure appropriate catheter length. A subcutaneous port pocket was then created along the upper chest wall utilizing a combination of sharp and blunt dissection. The pocket was irrigated with sterile saline. A single lumen ISP power  injectable port was chosen for placement. The 8 Fr catheter was tunneled from the port pocket site to the venotomy incision. The port was placed in the pocket. The external catheter was trimmed to appropriate length. At the venotomy, an 8 Fr peel-away sheath was placed over a guidewire under fluoroscopic guidance. The catheter was then placed through the sheath and the sheath was removed. Final catheter positioning was confirmed and documented with a fluoroscopic spot radiograph. The port was accessed with a Huber needle, aspirated and flushed with heparinized saline. The venotomy site was closed with an interrupted 4-0 Vicryl suture. The port pocket incision was closed with interrupted 2-0 Vicryl suture and the skin was opposed with a running subcuticular 4-0 Vicryl suture. Dermabond and Steri-strips were applied to both incisions. Dressings were placed. The patient tolerated the procedure well without immediate post procedural complication. FINDINGS: After catheter placement, the tip lies within the superior cavoatrial junction. The catheter aspirates and flushes normally and is ready for immediate use. IMPRESSION: Successful placement of a right internal jugular approach power injectable Port-A-Cath. The catheter is ready for immediate use. Electronically Signed   By: Sandi Mariscal M.D.   On: 03/08/2015 14:17   Ir US Guide Vasc Access Right  03/08/2015  INDICATION: History of lung cancer. In need of durable intravenous access for chemotherapy administration. EXAM: IMPLANTED PORT A CATH PLACEMENT WITH ULTRASOUND AND FLUOROSCOPIC GUIDANCE COMPARISON:  Chest CT - 12/29/2014 MEDICATIONS: Ancef 2 gm IV; The antibiotic was administered within an appropriate time interval prior to skin puncture. ANESTHESIA/SEDATION: Versed 2 mg IV; Fentanyl 100 mcg IV; Total Moderate Sedation Time 22  minutes. CONTRAST:  None FLUOROSCOPY TIME:  12 seconds (2 mGy) COMPLICATIONS: None immediate PROCEDURE: The procedure, risks, benefits,  and alternatives were explained to the patient. Questions regarding the procedure were encouraged and answered. The patient understands and consents to the procedure. The right neck and chest were prepped with chlorhexidine in a sterile fashion, and a sterile drape was applied covering the operative field. Maximum barrier sterile technique with sterile gowns and gloves were used for the procedure. A timeout was performed prior to the initiation of the procedure. Local anesthesia was provided with 1% lidocaine with epinephrine. After creating a small venotomy incision, a micropuncture kit was utilized to access the internal jugular vein under direct, real-time ultrasound guidance. Ultrasound image documentation was performed. The microwire was kinked to measure appropriate catheter length. A subcutaneous port pocket was then created along the upper chest wall utilizing a combination of sharp and blunt dissection. The pocket was irrigated with sterile saline. A single lumen ISP power injectable port was chosen for placement. The 8 Fr catheter was tunneled from  the port pocket site to the venotomy incision. The port was placed in the pocket. The external catheter was trimmed to appropriate length. At the venotomy, an 8 Fr peel-away sheath was placed over a guidewire under fluoroscopic guidance. The catheter was then placed through the sheath and the sheath was removed. Final catheter positioning was confirmed and documented with a fluoroscopic spot radiograph. The port was accessed with a Huber needle, aspirated and flushed with heparinized saline. The venotomy site was closed with an interrupted 4-0 Vicryl suture. The port pocket incision was closed with interrupted 2-0 Vicryl suture and the skin was opposed with a running subcuticular 4-0 Vicryl suture. Dermabond and Steri-strips were applied to both incisions. Dressings were placed. The patient tolerated the procedure well without immediate post procedural  complication. FINDINGS: After catheter placement, the tip lies within the superior cavoatrial junction. The catheter aspirates and flushes normally and is ready for immediate use. IMPRESSION: Successful placement of a right internal jugular approach power injectable Port-A-Cath. The catheter is ready for immediate use. Electronically Signed   By: Sandi Mariscal M.D.   On: 03/08/2015 14:17    ASSESSMENT AND PLAN: This is a very pleasant 44 years old African-American female recently diagnosed with metastatic non-small cell lung cancer, adenocarcinoma with negative EGFR mutation status post palliative radiotherapy to the left iliac bone and soft tissue mass. She is currently undergoing systemic chemotherapy with carboplatin and Alimta status post 1 cycle. She tolerated the first week of her treatment well except for mild nausea for only a few days after the treatment. I recommended for the patient to continue her treatment as scheduled. She will start cycle #2 today. For pain management, she requested a change of oxycodone to Percocet and I gave her new prescription for Percocet 7.5/325 mg by mouth every 6 hours as needed for pain. The patient will come back for follow-up visit in 3 weeks with the start of cycle #3. She was advised to call immediately if she has any concerning symptoms in the interval. The patient voices understanding of current disease status and treatment options and is in agreement with the current care plan.  All questions were answered. The patient knows to call the clinic with any problems, questions or concerns. We can certainly see the patient much sooner if necessary.  Disclaimer: This note was dictated with voice recognition software. Similar sounding words can inadvertently be transcribed and may not be corrected upon review.

## 2015-03-15 NOTE — Progress Notes (Signed)
Oncology Nurse Navigator Documentation  Oncology Nurse Navigator Flowsheets 03/15/2015  Referral date to RadOnc/MedOnc -  Navigator Encounter Type Clinic/MDC/I followed up with patient and her dad today at [facility].  She is currently on treatment and is and is doing well.  She did need me to fax a letter for her housing.  I did with confirmation fax received.  She was thankful for the help.    Patient Visit Type Follow-up  Treatment Phase Treatment  Barriers/Navigation Needs -  Education -  Interventions Coordination of Care  Coordination of Care -  Time Spent with Patient 30

## 2015-03-15 NOTE — Progress Notes (Signed)
Nutrition follow-up completed with patient receiving chemotherapy for non-small cell lung cancer. Patient reports she did have nausea after chemotherapy. This came on without warning. Patient had many questions regarding nausea medication which was clarified by her nurse today. Weight has increased and was documented as 125.3 pounds November 17 increased from 124.5 on October 31.  Nutrition diagnosis:  Unintended weight loss improved.  Intervention:  Educated patient to continue small frequent meals and snacks with high-calorie, high-protein foods. Educated patient again on strategies for eating with nausea and vomiting and provided additional fact sheet. Answered multiple questions.  Teach back method was used.  Monitoring, evaluation, goals: Patient will continue to work to increase calories and protein to minimize weight loss.  Next visit: Monday, December 12, during infusion.  **Disclaimer: This note was dictated with voice recognition software. Similar sounding words can inadvertently be transcribed and this note may contain transcription errors which may not have been corrected upon publication of note.**

## 2015-03-15 NOTE — Patient Instructions (Addendum)
[facility] Discharge Instructions for Patients Receiving Chemotherapy  Today you received the following chemotherapy agents Alimta/Carboplatin  To help prevent nausea and vomiting after your treatment, we encourage you to take your nausea medication as prescribed.  If you develop nausea and vomiting that is not controlled by your nausea medication, call the clinic.   BELOW ARE SYMPTOMS THAT SHOULD BE REPORTED IMMEDIATELY:  *FEVER GREATER THAN 100.5 F  *CHILLS WITH OR WITHOUT FEVER  NAUSEA AND VOMITING THAT IS NOT CONTROLLED WITH YOUR NAUSEA MEDICATION  *UNUSUAL SHORTNESS OF BREATH  *UNUSUAL BRUISING OR BLEEDING  TENDERNESS IN MOUTH AND THROAT WITH OR WITHOUT PRESENCE OF ULCERS  *URINARY PROBLEMS  *BOWEL PROBLEMS  UNUSUAL RASH Items with * indicate a potential emergency and should be followed up as soon as possible.  Feel free to call the clinic you have any questions or concerns. The clinic phone number is [phone].  Please show the [facility] at check-in to the Emergency Department and triage nurse.   Pemetrexed injection (Alimta) What is this medicine? PEMETREXED (PEM e TREX ed) is a chemotherapy drug. This medicine affects cells that are rapidly growing, such as cancer cells and cells in your mouth and stomach. It is usually used to treat lung cancers like non-small cell lung cancer and mesothelioma. It may also be used to treat other cancers. This medicine may be used for other purposes; ask your health care provider or pharmacist if you have questions. What should I tell my health care provider before I take this medicine? They need to know if you have any of these conditions: -if you frequently drink alcohol containing beverages -infection (especially a virus infection such as chickenpox, cold sores, or herpes) -kidney disease -liver disease -low blood counts, like low platelets, red bloods, or white blood cells -an unusual or allergic  reaction to pemetrexed, mannitol, other medicines, foods, dyes, or preservatives -pregnant or trying to get pregnant -breast-feeding How should I use this medicine? This drug is given as an infusion into a vein. It is administered in a hospital or clinic by a specially trained health care professional. Talk to your pediatrician regarding the use of this medicine in children. Special care may be needed. Overdosage: If you think you have taken too much of this medicine contact a poison control center or emergency room at once. NOTE: This medicine is only for you. Do not share this medicine with others. What if I miss a dose? It is important not to miss your dose. Call your doctor or health care professional if you are unable to keep an appointment. What may interact with this medicine? -aspirin and aspirin-like medicines -medicines to increase blood counts like filgrastim, pegfilgrastim, sargramostim -methotrexate -NSAIDS, medicines for pain and inflammation, like ibuprofen or naproxen -probenecid -pyrimethamine -vaccines Talk to your doctor or health care professional before taking any of these medicines: -acetaminophen -aspirin -ibuprofen -ketoprofen -naproxen This list may not describe all possible interactions. Give your health care provider a list of all the medicines, herbs, non-prescription drugs, or dietary supplements you use. Also tell them if you smoke, drink alcohol, or use illegal drugs. Some items may interact with your medicine. What should I watch for while using this medicine? Visit your doctor for checks on your progress. This drug may make you feel generally unwell. This is not uncommon, as chemotherapy can affect healthy cells as well as cancer cells. Report any side effects. Continue your course of treatment even though you feel ill  unless your doctor tells you to stop. In some cases, you may be given additional medicines to help with side effects. Follow all directions  for their use. Call your doctor or health care professional for advice if you get a fever, chills or sore throat, or other symptoms of a cold or flu. Do not treat yourself. This drug decreases your body's ability to fight infections. Try to avoid being around people who are sick. This medicine may increase your risk to bruise or bleed. Call your doctor or health care professional if you notice any unusual bleeding. Be careful brushing and flossing your teeth or using a toothpick because you may get an infection or bleed more easily. If you have any dental work done, tell your dentist you are receiving this medicine. Avoid taking products that contain aspirin, acetaminophen, ibuprofen, naproxen, or ketoprofen unless instructed by your doctor. These medicines may hide a fever. Call your doctor or health care professional if you get diarrhea or mouth sores. Do not treat yourself. To protect your kidneys, drink water or other fluids as directed while you are taking this medicine. Men and women must use effective birth control while taking this medicine. You may also need to continue using effective birth control for a time after stopping this medicine. Do not become pregnant while taking this medicine. Tell your doctor right away if you think that you or your partner might be pregnant. There is a potential for serious side effects to an unborn child. Talk to your health care professional or pharmacist for more information. Do not breast-feed an infant while taking this medicine. This medicine may lower sperm counts. What side effects may I notice from receiving this medicine? Side effects that you should report to your doctor or health care professional as soon as possible: -allergic reactions like skin rash, itching or hives, swelling of the face, lips, or tongue -low blood counts - this medicine may decrease the number of white blood cells, red blood cells and platelets. You may be at increased risk for  infections and bleeding. -signs of infection - fever or chills, cough, sore throat, pain or difficulty passing urine -signs of decreased platelets or bleeding - bruising, pinpoint red spots on the skin, black, tarry stools, blood in the urine -signs of decreased red blood cells - unusually weak or tired, fainting spells, lightheadedness -breathing problems, like a dry cough -changes in emotions or moods -chest pain -confusion -diarrhea -high blood pressure -mouth or throat sores or ulcers -pain, swelling, warmth in the leg -pain on swallowing -swelling of the ankles, feet, hands -trouble passing urine or change in the amount of urine -vomiting -yellowing of the eyes or skin Side effects that usually do not require medical attention (report to your doctor or health care professional if they continue or are bothersome): -hair loss -loss of appetite -nausea -stomach upset This list may not describe all possible side effects. Call your doctor for medical advice about side effects. You may report side effects to FDA at 1-800-FDA-1088. Where should I keep my medicine? This drug is given in a hospital or clinic and will not be stored at home. NOTE: This sheet is a summary. It may not cover all possible information. If you have questions about this medicine, talk to your doctor, pharmacist, or health care provider.    2016, [employer]. (2007-11-12 13:24:03)   Carboplatin injection What is this medicine? CARBOPLATIN (KAR boe pla tin) is a chemotherapy drug. It targets fast dividing cells,  like cancer cells, and causes these cells to die. This medicine is used to treat ovarian cancer and many other cancers. This medicine may be used for other purposes; ask your health care provider or pharmacist if you have questions. What should I tell my health care provider before I take this medicine? They need to know if you have any of these conditions: -blood disorders -hearing  problems -kidney disease -recent or ongoing radiation therapy -an unusual or allergic reaction to carboplatin, cisplatin, other chemotherapy, other medicines, foods, dyes, or preservatives -pregnant or trying to get pregnant -breast-feeding How should I use this medicine? This drug is usually given as an infusion into a vein. It is administered in a hospital or clinic by a specially trained health care professional. Talk to your pediatrician regarding the use of this medicine in children. Special care may be needed. Overdosage: If you think you have taken too much of this medicine contact a poison control center or emergency room at once. NOTE: This medicine is only for you. Do not share this medicine with others. What if I miss a dose? It is important not to miss a dose. Call your doctor or health care professional if you are unable to keep an appointment. What may interact with this medicine? -medicines for seizures -medicines to increase blood counts like filgrastim, pegfilgrastim, sargramostim -some antibiotics like amikacin, gentamicin, neomycin, streptomycin, tobramycin -vaccines Talk to your doctor or health care professional before taking any of these medicines: -acetaminophen -aspirin -ibuprofen -ketoprofen -naproxen This list may not describe all possible interactions. Give your health care provider a list of all the medicines, herbs, non-prescription drugs, or dietary supplements you use. Also tell them if you smoke, drink alcohol, or use illegal drugs. Some items may interact with your medicine. What should I watch for while using this medicine? Your condition will be monitored carefully while you are receiving this medicine. You will need important blood work done while you are taking this medicine. This drug may make you feel generally unwell. This is not uncommon, as chemotherapy can affect healthy cells as well as cancer cells. Report any side effects. Continue your course  of treatment even though you feel ill unless your doctor tells you to stop. In some cases, you may be given additional medicines to help with side effects. Follow all directions for their use. Call your doctor or health care professional for advice if you get a fever, chills or sore throat, or other symptoms of a cold or flu. Do not treat yourself. This drug decreases your body's ability to fight infections. Try to avoid being around people who are sick. This medicine may increase your risk to bruise or bleed. Call your doctor or health care professional if you notice any unusual bleeding. Be careful brushing and flossing your teeth or using a toothpick because you may get an infection or bleed more easily. If you have any dental work done, tell your dentist you are receiving this medicine. Avoid taking products that contain aspirin, acetaminophen, ibuprofen, naproxen, or ketoprofen unless instructed by your doctor. These medicines may hide a fever. Do not become pregnant while taking this medicine. Women should inform their doctor if they wish to become pregnant or think they might be pregnant. There is a potential for serious side effects to an unborn child. Talk to your health care professional or pharmacist for more information. Do not breast-feed an infant while taking this medicine. What side effects may I notice from receiving this medicine?  Side effects that you should report to your doctor or health care professional as soon as possible: -allergic reactions like skin rash, itching or hives, swelling of the face, lips, or tongue -signs of infection - fever or chills, cough, sore throat, pain or difficulty passing urine -signs of decreased platelets or bleeding - bruising, pinpoint red spots on the skin, black, tarry stools, nosebleeds -signs of decreased red blood cells - unusually weak or tired, fainting spells, lightheadedness -breathing problems -changes in hearing -changes in  vision -chest pain -high blood pressure -low blood counts - This drug may decrease the number of white blood cells, red blood cells and platelets. You may be at increased risk for infections and bleeding. -nausea and vomiting -pain, swelling, redness or irritation at the injection site -pain, tingling, numbness in the hands or feet -problems with balance, talking, walking -trouble passing urine or change in the amount of urine Side effects that usually do not require medical attention (report to your doctor or health care professional if they continue or are bothersome): -hair loss -loss of appetite -metallic taste in the mouth or changes in taste This list may not describe all possible side effects. Call your doctor for medical advice about side effects. You may report side effects to FDA at 1-800-FDA-1088. Where should I keep my medicine? This drug is given in a hospital or clinic and will not be stored at home. NOTE: This sheet is a summary. It may not cover all possible information. If you have questions about this medicine, talk to your doctor, pharmacist, or health care provider.    2016, [employer]. (2007-07-16 14:38:05)     Pemetrexed injection What is this medicine? PEMETREXED (PEM e TREX ed) is a chemotherapy drug. This medicine affects cells that are rapidly growing, such as cancer cells and cells in your mouth and stomach. It is usually used to treat lung cancers like non-small cell lung cancer and mesothelioma. It may also be used to treat other cancers. This medicine may be used for other purposes; ask your health care provider or pharmacist if you have questions. What should I tell my health care provider before I take this medicine? They need to know if you have any of these conditions: -if you frequently drink alcohol containing beverages -infection (especially a virus infection such as chickenpox, cold sores, or herpes) -kidney disease -liver  disease -low blood counts, like low platelets, red bloods, or white blood cells -an unusual or allergic reaction to pemetrexed, mannitol, other medicines, foods, dyes, or preservatives -pregnant or trying to get pregnant -breast-feeding How should I use this medicine? This drug is given as an infusion into a vein. It is administered in a hospital or clinic by a specially trained health care professional. Talk to your pediatrician regarding the use of this medicine in children. Special care may be needed. Overdosage: If you think you have taken too much of this medicine contact a poison control center or emergency room at once. NOTE: This medicine is only for you. Do not share this medicine with others. What if I miss a dose? It is important not to miss your dose. Call your doctor or health care professional if you are unable to keep an appointment. What may interact with this medicine? -aspirin and aspirin-like medicines -medicines to increase blood counts like filgrastim, pegfilgrastim, sargramostim -methotrexate -NSAIDS, medicines for pain and inflammation, like ibuprofen or naproxen -probenecid -pyrimethamine -vaccines Talk to your doctor or health care professional before taking any of  these medicines: -acetaminophen -aspirin -ibuprofen -ketoprofen -naproxen This list may not describe all possible interactions. Give your health care provider a list of all the medicines, herbs, non-prescription drugs, or dietary supplements you use. Also tell them if you smoke, drink alcohol, or use illegal drugs. Some items may interact with your medicine. What should I watch for while using this medicine? Visit your doctor for checks on your progress. This drug may make you feel generally unwell. This is not uncommon, as chemotherapy can affect healthy cells as well as cancer cells. Report any side effects. Continue your course of treatment even though you feel ill unless your doctor tells you to  stop. In some cases, you may be given additional medicines to help with side effects. Follow all directions for their use. Call your doctor or health care professional for advice if you get a fever, chills or sore throat, or other symptoms of a cold or flu. Do not treat yourself. This drug decreases your body's ability to fight infections. Try to avoid being around people who are sick. This medicine may increase your risk to bruise or bleed. Call your doctor or health care professional if you notice any unusual bleeding. Be careful brushing and flossing your teeth or using a toothpick because you may get an infection or bleed more easily. If you have any dental work done, tell your dentist you are receiving this medicine. Avoid taking products that contain aspirin, acetaminophen, ibuprofen, naproxen, or ketoprofen unless instructed by your doctor. These medicines may hide a fever. Call your doctor or health care professional if you get diarrhea or mouth sores. Do not treat yourself. To protect your kidneys, drink water or other fluids as directed while you are taking this medicine. Men and women must use effective birth control while taking this medicine. You may also need to continue using effective birth control for a time after stopping this medicine. Do not become pregnant while taking this medicine. Tell your doctor right away if you think that you or your partner might be pregnant. There is a potential for serious side effects to an unborn child. Talk to your health care professional or pharmacist for more information. Do not breast-feed an infant while taking this medicine. This medicine may lower sperm counts. What side effects may I notice from receiving this medicine? Side effects that you should report to your doctor or health care professional as soon as possible: -allergic reactions like skin rash, itching or hives, swelling of the face, lips, or tongue -low blood counts - this medicine may  decrease the number of white blood cells, red blood cells and platelets. You may be at increased risk for infections and bleeding. -signs of infection - fever or chills, cough, sore throat, pain or difficulty passing urine -signs of decreased platelets or bleeding - bruising, pinpoint red spots on the skin, black, tarry stools, blood in the urine -signs of decreased red blood cells - unusually weak or tired, fainting spells, lightheadedness -breathing problems, like a dry cough -changes in emotions or moods -chest pain -confusion -diarrhea -high blood pressure -mouth or throat sores or ulcers -pain, swelling, warmth in the leg -pain on swallowing -swelling of the ankles, feet, hands -trouble passing urine or change in the amount of urine -vomiting -yellowing of the eyes or skin Side effects that usually do not require medical attention (report to your doctor or health care professional if they continue or are bothersome): -hair loss -loss of appetite -nausea -stomach upset This  list may not describe all possible side effects. Call your doctor for medical advice about side effects. You may report side effects to FDA at 1-800-FDA-1088. Where should I keep my medicine? This drug is given in a hospital or clinic and will not be stored at home. NOTE: This sheet is a summary. It may not cover all possible information. If you have questions about this medicine, talk to your doctor, pharmacist, or health care provider.    2016, [employer] Standard. (2007-11-12 13:24:03)   Carboplatin injection What is this medicine? CARBOPLATIN (KAR boe pla tin) is a chemotherapy drug. It targets fast dividing cells, like cancer cells, and causes these cells to die. This medicine is used to treat ovarian cancer and many other cancers. This medicine may be used for other purposes; ask your health care provider or pharmacist if you have questions. What should I tell my health care provider before I take  this medicine? They need to know if you have any of these conditions: -blood disorders -hearing problems -kidney disease -recent or ongoing radiation therapy -an unusual or allergic reaction to carboplatin, cisplatin, other chemotherapy, other medicines, foods, dyes, or preservatives -pregnant or trying to get pregnant -breast-feeding How should I use this medicine? This drug is usually given as an infusion into a vein. It is administered in a hospital or clinic by a specially trained health care professional. Talk to your pediatrician regarding the use of this medicine in children. Special care may be needed. Overdosage: If you think you have taken too much of this medicine contact a poison control center or emergency room at once. NOTE: This medicine is only for you. Do not share this medicine with others. What if I miss a dose? It is important not to miss a dose. Call your doctor or health care professional if you are unable to keep an appointment. What may interact with this medicine? -medicines for seizures -medicines to increase blood counts like filgrastim, pegfilgrastim, sargramostim -some antibiotics like amikacin, gentamicin, neomycin, streptomycin, tobramycin -vaccines Talk to your doctor or health care professional before taking any of these medicines: -acetaminophen -aspirin -ibuprofen -ketoprofen -naproxen This list may not describe all possible interactions. Give your health care provider a list of all the medicines, herbs, non-prescription drugs, or dietary supplements you use. Also tell them if you smoke, drink alcohol, or use illegal drugs. Some items may interact with your medicine. What should I watch for while using this medicine? Your condition will be monitored carefully while you are receiving this medicine. You will need important blood work done while you are taking this medicine. This drug may make you feel generally unwell. This is not uncommon, as  chemotherapy can affect healthy cells as well as cancer cells. Report any side effects. Continue your course of treatment even though you feel ill unless your doctor tells you to stop. In some cases, you may be given additional medicines to help with side effects. Follow all directions for their use. Call your doctor or health care professional for advice if you get a fever, chills or sore throat, or other symptoms of a cold or flu. Do not treat yourself. This drug decreases your body's ability to fight infections. Try to avoid being around people who are sick. This medicine may increase your risk to bruise or bleed. Call your doctor or health care professional if you notice any unusual bleeding. Be careful brushing and flossing your teeth or using a toothpick because you may get an infection or  bleed more easily. If you have any dental work done, tell your dentist you are receiving this medicine. Avoid taking products that contain aspirin, acetaminophen, ibuprofen, naproxen, or ketoprofen unless instructed by your doctor. These medicines may hide a fever. Do not become pregnant while taking this medicine. Women should inform their doctor if they wish to become pregnant or think they might be pregnant. There is a potential for serious side effects to an unborn child. Talk to your health care professional or pharmacist for more information. Do not breast-feed an infant while taking this medicine. What side effects may I notice from receiving this medicine? Side effects that you should report to your doctor or health care professional as soon as possible: -allergic reactions like skin rash, itching or hives, swelling of the face, lips, or tongue -signs of infection - fever or chills, cough, sore throat, pain or difficulty passing urine -signs of decreased platelets or bleeding - bruising, pinpoint red spots on the skin, black, tarry stools, nosebleeds -signs of decreased red blood cells - unusually weak or  tired, fainting spells, lightheadedness -breathing problems -changes in hearing -changes in vision -chest pain -high blood pressure -low blood counts - This drug may decrease the number of white blood cells, red blood cells and platelets. You may be at increased risk for infections and bleeding. -nausea and vomiting -pain, swelling, redness or irritation at the injection site -pain, tingling, numbness in the hands or feet -problems with balance, talking, walking -trouble passing urine or change in the amount of urine Side effects that usually do not require medical attention (report to your doctor or health care professional if they continue or are bothersome): -hair loss -loss of appetite -metallic taste in the mouth or changes in taste This list may not describe all possible side effects. Call your doctor for medical advice about side effects. You may report side effects to FDA at 1-800-FDA-1088. Where should I keep my medicine? This drug is given in a hospital or clinic and will not be stored at home. NOTE: This sheet is a summary. It may not cover all possible information. If you have questions about this medicine, talk to your doctor, pharmacist, or health care provider.    2016, Elsevier/Gold Standard. (2007-07-16 14:38:05)

## 2015-03-15 NOTE — Progress Notes (Signed)
r port in place per report access with 20 g huber needle, brisk blood return, flushed, tegaderm applied.

## 2015-03-19 ENCOUNTER — Other Ambulatory Visit: Payer: Self-pay | Admitting: Medical Oncology

## 2015-03-19 DIAGNOSIS — C7951 Secondary malignant neoplasm of bone: Secondary | ICD-10-CM

## 2015-03-22 ENCOUNTER — Other Ambulatory Visit (HOSPITAL_BASED_OUTPATIENT_CLINIC_OR_DEPARTMENT_OTHER): Payer: No Typology Code available for payment source

## 2015-03-22 ENCOUNTER — Ambulatory Visit (HOSPITAL_BASED_OUTPATIENT_CLINIC_OR_DEPARTMENT_OTHER): Payer: No Typology Code available for payment source

## 2015-03-22 ENCOUNTER — Encounter: Payer: Self-pay | Admitting: *Deleted

## 2015-03-22 DIAGNOSIS — C7951 Secondary malignant neoplasm of bone: Secondary | ICD-10-CM

## 2015-03-22 DIAGNOSIS — C3490 Malignant neoplasm of unspecified part of unspecified bronchus or lung: Secondary | ICD-10-CM

## 2015-03-22 DIAGNOSIS — Z95828 Presence of other vascular implants and grafts: Secondary | ICD-10-CM

## 2015-03-22 LAB — COMPREHENSIVE METABOLIC PANEL (CC13)
ALBUMIN: 3.8 g/dL (ref 3.5–5.0)
ALK PHOS: 92 U/L (ref 40–150)
ALT: 118 U/L — ABNORMAL HIGH (ref 0–55)
AST: 53 U/L — ABNORMAL HIGH (ref 5–34)
Anion Gap: 9 mEq/L (ref 3–11)
BUN: 11.3 mg/dL (ref 7.0–26.0)
CHLORIDE: 106 meq/L (ref 98–109)
CO2: 24 mEq/L (ref 22–29)
Calcium: 9.8 mg/dL (ref 8.4–10.4)
Creatinine: 0.9 mg/dL (ref 0.6–1.1)
EGFR: 89 mL/min/{1.73_m2} — AB (ref >90)
GLUCOSE: 119 mg/dL (ref 70–140)
POTASSIUM: 3.7 meq/L (ref 3.5–5.1)
SODIUM: 139 meq/L (ref 136–145)
Total Bilirubin: 0.5 mg/dL (ref 0.20–1.20)
Total Protein: 7.7 g/dL (ref 6.4–8.3)

## 2015-03-22 LAB — CBC WITH DIFFERENTIAL/PLATELET
BASO%: 0.5 % (ref 0.0–2.0)
Basophils Absolute: 0 10*3/uL (ref 0.0–0.1)
EOS%: 3.2 % (ref 0.0–7.0)
Eosinophils Absolute: 0.1 10*3/uL (ref 0.0–0.5)
HCT: 35.9 % (ref 34.8–46.6)
HEMOGLOBIN: 12.1 g/dL (ref 11.6–15.9)
LYMPH%: 48.4 % (ref 14.0–49.7)
MCH: 32.2 pg (ref 25.1–34.0)
MCHC: 33.7 g/dL (ref 31.5–36.0)
MCV: 95.5 fL (ref 79.5–101.0)
MONO#: 0.2 10*3/uL (ref 0.1–0.9)
MONO%: 8.1 % (ref 0.0–14.0)
NEUT#: 0.7 10*3/uL — ABNORMAL LOW (ref 1.5–6.5)
NEUT%: 39.8 % (ref 38.4–76.8)
Platelets: 208 10*3/uL (ref 145–400)
RBC: 3.76 10*6/uL (ref 3.70–5.45)
RDW: 15.9 % — ABNORMAL HIGH (ref 11.2–14.5)
WBC: 1.9 10*3/uL — ABNORMAL LOW (ref 3.9–10.3)
lymph#: 0.9 10*3/uL (ref 0.9–3.3)

## 2015-03-22 MED ORDER — HEPARIN SOD (PORK) LOCK FLUSH 100 UNIT/ML IV SOLN
500.0000 [IU] | Freq: Once | INTRAVENOUS | Status: AC
  Administered 2015-03-22: 500 [IU] via INTRAVENOUS
  Filled 2015-03-22: qty 5

## 2015-03-22 MED ORDER — SODIUM CHLORIDE 0.9 % IJ SOLN
10.0000 mL | INTRAMUSCULAR | Status: DC | PRN
  Filled 2015-03-22: qty 10

## 2015-03-22 NOTE — Patient Instructions (Signed)

## 2015-03-24 ENCOUNTER — Encounter: Payer: Self-pay | Admitting: *Deleted

## 2015-03-24 NOTE — Progress Notes (Signed)
Made referral to PT

## 2015-03-24 NOTE — Progress Notes (Signed)
[facility]  Clinical Social Work was referred by patient to review and complete healthcare advance directives.  Clinical Social Worker met with patient  in [place] office.  The patient was not able to designate a healthcare power of attorney.  CSW and patient discussed possible options, patient requested time to think more about who she would choose as her advocate.  Patient  completed healthcare living will.    Clinical Social Worker notarized document and made copies for patient/family. Clinical Social Worker will send documents to medical records to be scanned into patient's chart. Clinical Social Worker encouraged patient/family to contact with any additional questions or concerns.  Polo Riley, MSW, [place] Worker [facility] [phone]

## 2015-03-25 ENCOUNTER — Other Ambulatory Visit: Payer: Self-pay | Admitting: *Deleted

## 2015-03-25 ENCOUNTER — Telehealth: Payer: Self-pay | Admitting: *Deleted

## 2015-03-25 DIAGNOSIS — C3490 Malignant neoplasm of unspecified part of unspecified bronchus or lung: Secondary | ICD-10-CM

## 2015-03-25 DIAGNOSIS — C7951 Secondary malignant neoplasm of bone: Secondary | ICD-10-CM

## 2015-03-25 NOTE — Telephone Encounter (Signed)
Returned call to pt who inquired with scheduling earlier about an appt for PET scan. Discussed with pt per MD, after cycle 3 ( dec12) MD will order a CT scan. It will then be scheduled and she will get a call from [facility] scheduling with appt date/time. Pt verbalized understanding, no further concerns.

## 2015-03-29 ENCOUNTER — Ambulatory Visit: Payer: No Typology Code available for payment source

## 2015-03-29 ENCOUNTER — Other Ambulatory Visit (HOSPITAL_BASED_OUTPATIENT_CLINIC_OR_DEPARTMENT_OTHER): Payer: Medicaid Other

## 2015-03-29 ENCOUNTER — Ambulatory Visit (HOSPITAL_BASED_OUTPATIENT_CLINIC_OR_DEPARTMENT_OTHER): Payer: Medicaid Other

## 2015-03-29 VITALS — BP 92/59 | HR 100 | Temp 98.5°F | Resp 18

## 2015-03-29 DIAGNOSIS — C7951 Secondary malignant neoplasm of bone: Secondary | ICD-10-CM | POA: Diagnosis not present

## 2015-03-29 DIAGNOSIS — C3490 Malignant neoplasm of unspecified part of unspecified bronchus or lung: Secondary | ICD-10-CM

## 2015-03-29 LAB — COMPREHENSIVE METABOLIC PANEL
ALT: 79 U/L — ABNORMAL HIGH (ref 0–55)
ANION GAP: 8 meq/L (ref 3–11)
AST: 44 U/L — ABNORMAL HIGH (ref 5–34)
Albumin: 3.7 g/dL (ref 3.5–5.0)
Alkaline Phosphatase: 79 U/L (ref 40–150)
BUN: 5.7 mg/dL — ABNORMAL LOW (ref 7.0–26.0)
CALCIUM: 9.5 mg/dL (ref 8.4–10.4)
CHLORIDE: 109 meq/L (ref 98–109)
CO2: 24 mEq/L (ref 22–29)
Creatinine: 0.9 mg/dL (ref 0.6–1.1)
EGFR: >90 mL/min/{1.73_m2} (ref >90)
Glucose: 85 mg/dl (ref 70–140)
POTASSIUM: 3.5 meq/L (ref 3.5–5.1)
Sodium: 141 mEq/L (ref 136–145)
Total Bilirubin: 0.44 mg/dL (ref 0.20–1.20)
Total Protein: 7.1 g/dL (ref 6.4–8.3)

## 2015-03-29 LAB — CBC WITH DIFFERENTIAL/PLATELET
BASO%: 1 % (ref 0.0–2.0)
BASOS ABS: 0 10*3/uL (ref 0.0–0.1)
EOS%: 4.3 % (ref 0.0–7.0)
Eosinophils Absolute: 0.1 10*3/uL (ref 0.0–0.5)
HCT: 34 % — ABNORMAL LOW (ref 34.8–46.6)
HGB: 11.4 g/dL — ABNORMAL LOW (ref 11.6–15.9)
LYMPH%: 35.1 % (ref 14.0–49.7)
MCH: 32.9 pg (ref 25.1–34.0)
MCHC: 33.6 g/dL (ref 31.5–36.0)
MCV: 97.8 fL (ref 79.5–101.0)
MONO#: 0.3 10*3/uL (ref 0.1–0.9)
MONO%: 12.7 % (ref 0.0–14.0)
NEUT#: 1.1 10*3/uL — ABNORMAL LOW (ref 1.5–6.5)
NEUT%: 46.9 % (ref 38.4–76.8)
Platelets: 208 10*3/uL (ref 145–400)
RBC: 3.48 10*6/uL — AB (ref 3.70–5.45)
RDW: 17.6 % — ABNORMAL HIGH (ref 11.2–14.5)
WBC: 2.4 10*3/uL — ABNORMAL LOW (ref 3.9–10.3)
lymph#: 0.8 10*3/uL — ABNORMAL LOW (ref 0.9–3.3)

## 2015-03-29 MED ORDER — HEPARIN SOD (PORK) LOCK FLUSH 100 UNIT/ML IV SOLN
500.0000 [IU] | Freq: Once | INTRAVENOUS | Status: AC
  Administered 2015-03-29: 500 [IU]
  Filled 2015-03-29: qty 5

## 2015-03-29 MED ORDER — SODIUM CHLORIDE 0.9 % IJ SOLN
10.0000 mL | Freq: Once | INTRAMUSCULAR | Status: AC
  Administered 2015-03-29: 10 mL
  Filled 2015-03-29: qty 10

## 2015-03-29 NOTE — Progress Notes (Signed)
Abelina Bachelor, RN desk nurse notified of BP  [phone].  Pt stated she was running late and had not had anything to eat or drink yet.  Pt was asymptomatic.  Pt was stable at discharge after flush appt.

## 2015-03-30 ENCOUNTER — Ambulatory Visit: Payer: No Typology Code available for payment source | Admitting: Physical Therapy

## 2015-03-30 ENCOUNTER — Ambulatory Visit (INDEPENDENT_AMBULATORY_CARE_PROVIDER_SITE_OTHER): Payer: Medicaid Other | Admitting: *Deleted

## 2015-03-30 DIAGNOSIS — Z3042 Encounter for surveillance of injectable contraceptive: Secondary | ICD-10-CM

## 2015-03-30 DIAGNOSIS — N946 Dysmenorrhea, unspecified: Secondary | ICD-10-CM

## 2015-03-30 MED ORDER — MEDROXYPROGESTERONE ACETATE 150 MG/ML IM SUSP
150.0000 mg | INTRAMUSCULAR | Status: DC
  Administered 2015-03-30: 150 mg via INTRAMUSCULAR

## 2015-04-02 ENCOUNTER — Other Ambulatory Visit: Payer: Self-pay | Admitting: Medical Oncology

## 2015-04-02 DIAGNOSIS — C3490 Malignant neoplasm of unspecified part of unspecified bronchus or lung: Secondary | ICD-10-CM

## 2015-04-05 ENCOUNTER — Other Ambulatory Visit: Payer: Self-pay | Admitting: Medical Oncology

## 2015-04-05 ENCOUNTER — Encounter: Payer: Self-pay | Admitting: Internal Medicine

## 2015-04-05 ENCOUNTER — Encounter: Payer: Self-pay | Admitting: *Deleted

## 2015-04-05 ENCOUNTER — Ambulatory Visit: Payer: Medicaid Other

## 2015-04-05 ENCOUNTER — Ambulatory Visit (HOSPITAL_BASED_OUTPATIENT_CLINIC_OR_DEPARTMENT_OTHER): Payer: Medicaid Other | Admitting: Internal Medicine

## 2015-04-05 ENCOUNTER — Ambulatory Visit: Payer: Medicaid Other | Admitting: Nutrition

## 2015-04-05 ENCOUNTER — Other Ambulatory Visit (HOSPITAL_BASED_OUTPATIENT_CLINIC_OR_DEPARTMENT_OTHER): Payer: Medicaid Other

## 2015-04-05 ENCOUNTER — Ambulatory Visit (HOSPITAL_BASED_OUTPATIENT_CLINIC_OR_DEPARTMENT_OTHER): Payer: Medicaid Other

## 2015-04-05 ENCOUNTER — Telehealth: Payer: Self-pay | Admitting: Internal Medicine

## 2015-04-05 VITALS — BP 120/74 | HR 89 | Temp 98.4°F | Resp 18 | Ht 66.0 in | Wt 138.7 lb

## 2015-04-05 DIAGNOSIS — C7951 Secondary malignant neoplasm of bone: Secondary | ICD-10-CM

## 2015-04-05 DIAGNOSIS — C3490 Malignant neoplasm of unspecified part of unspecified bronchus or lung: Secondary | ICD-10-CM

## 2015-04-05 DIAGNOSIS — C3491 Malignant neoplasm of unspecified part of right bronchus or lung: Secondary | ICD-10-CM | POA: Diagnosis not present

## 2015-04-05 DIAGNOSIS — Z5111 Encounter for antineoplastic chemotherapy: Secondary | ICD-10-CM | POA: Diagnosis not present

## 2015-04-05 DIAGNOSIS — R52 Pain, unspecified: Secondary | ICD-10-CM | POA: Diagnosis not present

## 2015-04-05 DIAGNOSIS — R530 Neoplastic (malignant) related fatigue: Secondary | ICD-10-CM | POA: Diagnosis not present

## 2015-04-05 DIAGNOSIS — Z95828 Presence of other vascular implants and grafts: Secondary | ICD-10-CM

## 2015-04-05 DIAGNOSIS — R110 Nausea: Secondary | ICD-10-CM

## 2015-04-05 LAB — CBC WITH DIFFERENTIAL/PLATELET
BASO%: 0.6 % (ref 0.0–2.0)
Basophils Absolute: 0 10*3/uL (ref 0.0–0.1)
EOS%: 0 % (ref 0.0–7.0)
Eosinophils Absolute: 0 10*3/uL (ref 0.0–0.5)
HEMATOCRIT: 32.3 % — AB (ref 34.8–46.6)
HEMOGLOBIN: 10.8 g/dL — AB (ref 11.6–15.9)
LYMPH#: 0.4 10*3/uL — AB (ref 0.9–3.3)
LYMPH%: 13.6 % — ABNORMAL LOW (ref 14.0–49.7)
MCH: 33 pg (ref 25.1–34.0)
MCHC: 33.5 g/dL (ref 31.5–36.0)
MCV: 98.6 fL (ref 79.5–101.0)
MONO#: 0.1 10*3/uL (ref 0.1–0.9)
MONO%: 4.2 % (ref 0.0–14.0)
NEUT#: 2.2 10*3/uL (ref 1.5–6.5)
NEUT%: 81.6 % — ABNORMAL HIGH (ref 38.4–76.8)
Platelets: 226 10*3/uL (ref 145–400)
RBC: 3.28 10*6/uL — ABNORMAL LOW (ref 3.70–5.45)
RDW: 19.3 % — AB (ref 11.2–14.5)
WBC: 2.7 10*3/uL — AB (ref 3.9–10.3)

## 2015-04-05 LAB — COMPREHENSIVE METABOLIC PANEL
ALBUMIN: 3.9 g/dL (ref 3.5–5.0)
ALK PHOS: 80 U/L (ref 40–150)
ALT: 67 U/L — AB (ref 0–55)
AST: 38 U/L — AB (ref 5–34)
Anion Gap: 10 mEq/L (ref 3–11)
BILIRUBIN TOTAL: 0.43 mg/dL (ref 0.20–1.20)
BUN: 8.8 mg/dL (ref 7.0–26.0)
CALCIUM: 10 mg/dL (ref 8.4–10.4)
CHLORIDE: 108 meq/L (ref 98–109)
CO2: 21 mEq/L — ABNORMAL LOW (ref 22–29)
CREATININE: 1 mg/dL (ref 0.6–1.1)
EGFR: 85 mL/min/{1.73_m2} — ABNORMAL LOW (ref >90)
Glucose: 140 mg/dl (ref 70–140)
Potassium: 4 mEq/L (ref 3.5–5.1)
SODIUM: 139 meq/L (ref 136–145)
Total Protein: 7.6 g/dL (ref 6.4–8.3)

## 2015-04-05 MED ORDER — HEPARIN SOD (PORK) LOCK FLUSH 100 UNIT/ML IV SOLN
500.0000 [IU] | Freq: Once | INTRAVENOUS | Status: AC | PRN
  Administered 2015-04-05: 500 [IU]
  Filled 2015-04-05: qty 5

## 2015-04-05 MED ORDER — SODIUM CHLORIDE 0.9 % IV SOLN
400.0000 mg/m2 | Freq: Once | INTRAVENOUS | Status: AC
  Administered 2015-04-05: 650 mg via INTRAVENOUS
  Filled 2015-04-05: qty 26

## 2015-04-05 MED ORDER — SODIUM CHLORIDE 0.9 % IJ SOLN
10.0000 mL | INTRAMUSCULAR | Status: DC | PRN
  Administered 2015-04-05: 10 mL via INTRAVENOUS
  Filled 2015-04-05: qty 10

## 2015-04-05 MED ORDER — OXYCODONE-ACETAMINOPHEN 5-325 MG PO TABS
1.0000 | ORAL_TABLET | ORAL | Status: AC
  Administered 2015-04-05: 1 via ORAL

## 2015-04-05 MED ORDER — OXYCODONE-ACETAMINOPHEN 5-325 MG PO TABS
ORAL_TABLET | ORAL | Status: AC
  Filled 2015-04-05: qty 1

## 2015-04-05 MED ORDER — SODIUM CHLORIDE 0.9 % IV SOLN
Freq: Once | INTRAVENOUS | Status: AC
  Administered 2015-04-05: 13:00:00 via INTRAVENOUS
  Filled 2015-04-05: qty 8

## 2015-04-05 MED ORDER — SODIUM CHLORIDE 0.9 % IV SOLN
Freq: Once | INTRAVENOUS | Status: AC
  Administered 2015-04-05: 12:00:00 via INTRAVENOUS

## 2015-04-05 MED ORDER — SODIUM CHLORIDE 0.9 % IJ SOLN
10.0000 mL | INTRAMUSCULAR | Status: DC | PRN
  Administered 2015-04-05: 10 mL
  Filled 2015-04-05: qty 10

## 2015-04-05 MED ORDER — CYANOCOBALAMIN 1000 MCG/ML IJ SOLN
INTRAMUSCULAR | Status: AC
  Filled 2015-04-05: qty 1

## 2015-04-05 MED ORDER — CYANOCOBALAMIN 1000 MCG/ML IJ SOLN
1000.0000 ug | Freq: Once | INTRAMUSCULAR | Status: AC
  Administered 2015-04-05: 1000 ug via INTRAMUSCULAR

## 2015-04-05 MED ORDER — SODIUM CHLORIDE 0.9 % IV SOLN
475.0000 mg | Freq: Once | INTRAVENOUS | Status: AC
  Administered 2015-04-05: 480 mg via INTRAVENOUS
  Filled 2015-04-05: qty 48

## 2015-04-05 NOTE — Progress Notes (Signed)
Spoke with patient today during her tx.  She is doing well.  She is having some lower back pain.  She stated she spoke with Dr. Julien Nordmann and he addressed.  No barriers identified at this time

## 2015-04-05 NOTE — Progress Notes (Signed)
[facility] Telephone:([phone]) [phone]   Fax:([phone]) [phone]  OFFICE PROGRESS NOTE  [name], MD [name] [address]  DIAGNOSIS: Stage IV (T1a, N2, M1b) non-small cell lung cancer, adenocarcinoma with negative EGFR mutation diagnosed in August 2016 and presented with right hilar and subcarinal lymphadenopathy as well as large soft tissue mass in the left posterior iliac bone.  PRIOR THERAPY: Status post palliative radiotherapy to the soft tissue mass in the left iliac bone under the care of Dr. Valere Dross  CURRENT THERAPY: Systemic chemotherapy with carboplatin for AUC of 5 and Alimta 500 MG/M2 every 3 weeks. First dose expected on 02/22/2015. Status post 2 cycles. Starting from cycle #3 Alimta was reduced to 400 MG/M2 secondary to neutropenia after the first 2 cycles.  INTERVAL HISTORY: Andrea [name] 44 y.o. female returns to the clinic today for follow-up visit.  The patient is feeling fine today with no specific complaints except for persistent pain in the left iliac area and lower back. 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 She is currently on Percocet 7.5/325 mg by mouth every 6 hours as needed for pain. She sometimes takes 2 tablets at a time. She tolerated the first 2 cycles of her systemic chemotherapy with carboplatin and Alimta fairly well except for neutropenia and mild fatigue. She denied having any significant chest pain, shortness of breath, cough or hemoptysis. She has no significant weight loss or night sweats. She has no headache or visual changes. She is here today to start cycle #3.  MEDICAL HISTORY: Past Medical History  Diagnosis Date  . Low back pain radiating to left leg 11/29/2010  . Dysmenorrhea 12/06/2009    Qualifier: Diagnosis of  By: [name] RN, Regino Schultze    . Chronic back pain   . Family history of adverse reaction to anesthesia     mother stopped  breathing after surgery  . Anxiety   . Depression   . Headache     stress  . S/P radiation therapy 01/01/2015 through 01/21/2015      Left iliac bone 3500 cGy in 14 sessions   . Shortness of breath dyspnea     with exertion, Pt denies 02/2015  . Anginal pain ([place])     chest pain not sure what related to    ALLERGIES:  is allergic to morphine and related and hydrocodone.  MEDICATIONS:  Current Outpatient Prescriptions  Medication Sig Dispense Refill  . cyanocobalamin 500 MCG tablet Take 500 mcg by mouth daily.    [name] dexamethasone (DECADRON) 4 MG tablet 4 mg po bid, the day before, day of and day after chemo 40 tablet 1  . feeding supplement, ENSURE ENLIVE, (ENSURE ENLIVE) LIQD Take 237 mLs by mouth 3 (three) times daily between meals. 983 mL 12  . folic acid (FOLVITE) 1 MG tablet Take 1 tablet (1 mg total) by mouth daily. 30 tablet 4  . lidocaine-prilocaine (EMLA) cream Apply 1 teaspoon to skin 1 to 2 hours before use cover to keep in contact with skin 30 g PRN  . Multiple Vitamins-Minerals (MULTIVITAMIN & MINERAL PO) Take 2 tablets by mouth daily.     [name] oxyCODONE-acetaminophen (PERCOCET) 7.5-325 MG tablet Take 1 tablet by mouth every 6 (six) hours as needed for severe pain. 60 tablet 0  . polyethylene glycol (MIRALAX / GLYCOLAX) packet Take 17 g by mouth daily as needed  for mild constipation. 14 each 0  . Prenatal Vit-Fe Fumarate-FA (PRENATAL VITAMIN PO) Take 1 tablet by mouth daily.    . prochlorperazine (COMPAZINE) 10 MG tablet Take 1 tablet (10 mg total) by mouth every 6 (six) hours as needed for nausea or vomiting. 60 tablet 0  . senna-docusate (SENOKOT-S) 8.6-50 MG per tablet Take 2 tablets by mouth at bedtime. 30 tablet 0   No current facility-administered medications for this visit.    SURGICAL HISTORY:  Past Surgical History  Procedure Laterality Date  . Laproscopy surgery to check her tubes    . Lumbar  laminectomy/decompression microdiscectomy Left 05/01/2014    Procedure: Microdiscectomy - L5-S1 - left;  Surgeon: Eustace Moore, MD;  Location: [facility];  Service: Neurosurgery;  Laterality: Left;    REVIEW OF SYSTEMS:  Constitutional: positive for fatigue Eyes: negative Ears, nose, mouth, throat, and face: negative Respiratory: negative Cardiovascular: negative Gastrointestinal: negative Genitourinary:negative Integument/breast: negative Hematologic/lymphatic: negative Musculoskeletal:positive for back pain and bone pain Neurological: negative Behavioral/Psych: negative Endocrine: negative Allergic/Immunologic: negative   PHYSICAL EXAMINATION: General appearance: alert, cooperative, fatigued and no distress Head: Normocephalic, without obvious abnormality, atraumatic Neck: no adenopathy, no JVD, supple, symmetrical, trachea midline and thyroid not enlarged, symmetric, no tenderness/mass/nodules Lymph nodes: Cervical, supraclavicular, and axillary nodes normal. Resp: clear to auscultation bilaterally Back: symmetric, no curvature. ROM normal. No CVA tenderness. Cardio: regular rate and rhythm, S1, S2 normal, no murmur, click, rub or gallop GI: soft, non-tender; bowel sounds normal; no masses,  no organomegaly Extremities: extremities normal, atraumatic, no cyanosis or edema Neurologic: Alert and oriented X 3, normal strength and tone. Normal symmetric reflexes. Normal coordination and gait  ECOG PERFORMANCE STATUS: 1 - Symptomatic but completely ambulatory  Blood pressure 120/74, pulse 89, temperature 98.4 F (36.9 C), temperature source Oral, resp. rate 18, height 5' 6"  (1.676 m), weight 138 lb 11.2 oz (62.914 kg), last menstrual period 07/24/2014, SpO2 100 %.  LABORATORY DATA: Lab Results  Component Value Date   WBC 2.7* 04/05/2015   HGB 10.8* 04/05/2015   HCT 32.3* 04/05/2015   MCV 98.6 04/05/2015   PLT 226 04/05/2015      Chemistry      Component Value Date/Time   NA  141 03/29/2015 1142   NA 141 03/08/2015 1100   K 3.5 03/29/2015 1142   K 4.2 03/08/2015 1100   CL 111 03/08/2015 1100   CO2 24 03/29/2015 1142   CO2 22 03/08/2015 1100   BUN 5.7* 03/29/2015 1142   BUN 8 03/08/2015 1100   CREATININE 0.9 03/29/2015 1142   CREATININE 0.77 03/08/2015 1100   CREATININE 0.85 01/08/2014 1637      Component Value Date/Time   CALCIUM 9.5 03/29/2015 1142   CALCIUM 9.5 03/08/2015 1100   ALKPHOS 79 03/29/2015 1142   ALKPHOS 74 04/20/2013 1320   AST 44* 03/29/2015 1142   AST 17 04/20/2013 1320   ALT 79* 03/29/2015 1142   ALT 14 04/20/2013 1320   BILITOT 0.44 03/29/2015 1142   BILITOT 0.5 04/20/2013 1320       RADIOGRAPHIC STUDIES: Ir Fluoro Guide Cv Line Right  03/08/2015  INDICATION: History of lung cancer. In need of durable intravenous access for chemotherapy administration. EXAM: IMPLANTED PORT A CATH PLACEMENT WITH ULTRASOUND AND FLUOROSCOPIC GUIDANCE COMPARISON:  Chest CT - 12/29/2014 MEDICATIONS: Ancef 2 gm IV; The antibiotic was administered within an appropriate time interval prior to skin puncture. ANESTHESIA/SEDATION: Versed 2 mg IV; Fentanyl 100 mcg IV; Total Moderate Sedation Time 22  minutes. CONTRAST:  None FLUOROSCOPY TIME:  12 seconds (2 mGy) COMPLICATIONS: None immediate PROCEDURE: The procedure, risks, benefits, and alternatives were explained to the patient. Questions regarding the procedure were encouraged and answered. The patient understands and consents to the procedure. The right neck and chest were prepped with chlorhexidine in a sterile fashion, and a sterile drape was applied covering the operative field. Maximum barrier sterile technique with sterile gowns and gloves were used for the procedure. A timeout was performed prior to the initiation of the procedure. Local anesthesia was provided with 1% lidocaine with epinephrine. After creating a small venotomy incision, a micropuncture kit was utilized to access the internal jugular vein  under direct, real-time ultrasound guidance. Ultrasound image documentation was performed. The microwire was kinked to measure appropriate catheter length. A subcutaneous port pocket was then created along the upper chest wall utilizing a combination of sharp and blunt dissection. The pocket was irrigated with sterile saline. A single lumen ISP power injectable port was chosen for placement. The 8 Fr catheter was tunneled from the port pocket site to the venotomy incision. The port was placed in the pocket. The external catheter was trimmed to appropriate length. At the venotomy, an 8 Fr peel-away sheath was placed over a guidewire under fluoroscopic guidance. The catheter was then placed through the sheath and the sheath was removed. Final catheter positioning was confirmed and documented with a fluoroscopic spot radiograph. The port was accessed with a Huber needle, aspirated and flushed with heparinized saline. The venotomy site was closed with an interrupted 4-0 Vicryl suture. The port pocket incision was closed with interrupted 2-0 Vicryl suture and the skin was opposed with a running subcuticular 4-0 Vicryl suture. Dermabond and Steri-strips were applied to both incisions. Dressings were placed. The patient tolerated the procedure well without immediate post procedural complication. FINDINGS: After catheter placement, the tip lies within the superior cavoatrial junction. The catheter aspirates and flushes normally and is ready for immediate use. IMPRESSION: Successful placement of a right internal jugular approach power injectable Port-A-Cath. The catheter is ready for immediate use. Electronically Signed   By: Sandi Mariscal M.D.   On: 03/08/2015 14:17   Ir US Guide Vasc Access Right  03/08/2015  INDICATION: History of lung cancer. In need of durable intravenous access for chemotherapy administration. EXAM: IMPLANTED PORT A CATH PLACEMENT WITH ULTRASOUND AND FLUOROSCOPIC GUIDANCE COMPARISON:  Chest CT -  12/29/2014 MEDICATIONS: Ancef 2 gm IV; The antibiotic was administered within an appropriate time interval prior to skin puncture. ANESTHESIA/SEDATION: Versed 2 mg IV; Fentanyl 100 mcg IV; Total Moderate Sedation Time 22  minutes. CONTRAST:  None FLUOROSCOPY TIME:  12 seconds (2 mGy) COMPLICATIONS: None immediate PROCEDURE: The procedure, risks, benefits, and alternatives were explained to the patient. Questions regarding the procedure were encouraged and answered. The patient understands and consents to the procedure. The right neck and chest were prepped with chlorhexidine in a sterile fashion, and a sterile drape was applied covering the operative field. Maximum barrier sterile technique with sterile gowns and gloves were used for the procedure. A timeout was performed prior to the initiation of the procedure. Local anesthesia was provided with 1% lidocaine with epinephrine. After creating a small venotomy incision, a micropuncture kit was utilized to access the internal jugular vein under direct, real-time ultrasound guidance. Ultrasound image documentation was performed. The microwire was kinked to measure appropriate catheter length. A subcutaneous port pocket was then created along the upper chest wall utilizing a combination of sharp  and blunt dissection. The pocket was irrigated with sterile saline. A single lumen ISP power injectable port was chosen for placement. The 8 Fr catheter was tunneled from the port pocket site to the venotomy incision. The port was placed in the pocket. The external catheter was trimmed to appropriate length. At the venotomy, an 8 Fr peel-away sheath was placed over a guidewire under fluoroscopic guidance. The catheter was then placed through the sheath and the sheath was removed. Final catheter positioning was confirmed and documented with a fluoroscopic spot radiograph. The port was accessed with a Huber needle, aspirated and flushed with heparinized saline. The venotomy site  was closed with an interrupted 4-0 Vicryl suture. The port pocket incision was closed with interrupted 2-0 Vicryl suture and the skin was opposed with a running subcuticular 4-0 Vicryl suture. Dermabond and Steri-strips were applied to both incisions. Dressings were placed. The patient tolerated the procedure well without immediate post procedural complication. FINDINGS: After catheter placement, the tip lies within the superior cavoatrial junction. The catheter aspirates and flushes normally and is ready for immediate use. IMPRESSION: Successful placement of a right internal jugular approach power injectable Port-A-Cath. The catheter is ready for immediate use. Electronically Signed   By: Sandi Mariscal M.D.   On: 03/08/2015 14:17    ASSESSMENT AND PLAN: This is a very pleasant 44 years old African-American female recently diagnosed with metastatic non-small cell lung cancer, adenocarcinoma with negative EGFR mutation status post palliative radiotherapy to the left iliac bone and soft tissue mass. She is currently undergoing systemic chemotherapy with carboplatin and Alimta status post 2 cycles.  She is tolerating her treatment fairly well with no specific complaints except for mild fatigue and nausea as well as neutropenia after the second cycle. I recommended for the patient to continue her treatment as scheduled. She will start cycle #3 today but I will reduce the dose of Alimta to 400 MG/M2 starting from cycle #3. For pain management, she will continue on Percocet 7.5/325 mg by mouth every 6 hours as needed for pain. The patient will come back for follow-up visit in 3 weeks with the start of cycle #4 after repeating CT scan of the chest, abdomen and pelvis. She was advised to call immediately if she has any concerning symptoms in the interval. The patient voices understanding of current disease status and treatment options and is in agreement with the current care plan.  All questions were answered.  The patient knows to call the clinic with any problems, questions or concerns. We can certainly see the patient much sooner if necessary.  Disclaimer: This note was dictated with voice recognition software. Similar sounding words can inadvertently be transcribed and may not be corrected upon review.

## 2015-04-05 NOTE — Telephone Encounter (Signed)
Gave and printed appt sched and avs for pt for DEC and Jan...the patient wants water based

## 2015-04-05 NOTE — Progress Notes (Signed)
Nutrition follow-up completed with patient receiving chemotherapy for non-small cell lung cancer. Patient reports nausea has improved after she began taking nausea medications. Weight improved and was documented as 138.7 pounds December 12 increased from 125.3 pounds November 17. Patient denies constipation. She does report increased pain and is requesting pain medication.  Nutrition diagnosis: Unintended weight loss improved.  Intervention: Educated patient to continue small frequent meals and snacks with high-calorie, high-protein foods. Recommended patient continue nausea medications as prescribed. Questions were answered.  Teach back method was used.  Monitoring, evaluation, goals: Patient will continue to work to increase calories and protein to minimize weight loss.  Next visit: Tuesday, January 3, during infusion.  **Disclaimer: This note was dictated with voice recognition software. Similar sounding words can inadvertently be transcribed and this note may contain transcription errors which may not have been corrected upon publication of note.**

## 2015-04-05 NOTE — Patient Instructions (Signed)
[facility] Discharge Instructions for Patients Receiving Chemotherapy  Today you received the following chemotherapy agents: Alimta and Carboplatin.  To help prevent nausea and vomiting after your treatment, we encourage you to take your nausea medication: Compazine 10 mg every 6 hours as needed.   If you develop nausea and vomiting that is not controlled by your nausea medication, call the clinic.   BELOW ARE SYMPTOMS THAT SHOULD BE REPORTED IMMEDIATELY:  *FEVER GREATER THAN 100.5 F  *CHILLS WITH OR WITHOUT FEVER  NAUSEA AND VOMITING THAT IS NOT CONTROLLED WITH YOUR NAUSEA MEDICATION  *UNUSUAL SHORTNESS OF BREATH  *UNUSUAL BRUISING OR BLEEDING  TENDERNESS IN MOUTH AND THROAT WITH OR WITHOUT PRESENCE OF ULCERS  *URINARY PROBLEMS  *BOWEL PROBLEMS  UNUSUAL RASH Items with * indicate a potential emergency and should be followed up as soon as possible.  Feel free to call the clinic you have any questions or concerns. The clinic phone number is [phone].  Please show the [facility] at check-in to the Emergency Department and triage nurse.

## 2015-04-07 ENCOUNTER — Telehealth: Payer: Self-pay | Admitting: *Deleted

## 2015-04-07 ENCOUNTER — Ambulatory Visit: Payer: Medicaid Other | Attending: Internal Medicine | Admitting: Physical Therapy

## 2015-04-07 ENCOUNTER — Other Ambulatory Visit: Payer: Self-pay | Admitting: *Deleted

## 2015-04-07 DIAGNOSIS — R531 Weakness: Secondary | ICD-10-CM

## 2015-04-07 DIAGNOSIS — Z7409 Other reduced mobility: Secondary | ICD-10-CM | POA: Insufficient documentation

## 2015-04-07 DIAGNOSIS — R269 Unspecified abnormalities of gait and mobility: Secondary | ICD-10-CM | POA: Diagnosis present

## 2015-04-07 DIAGNOSIS — M25552 Pain in left hip: Secondary | ICD-10-CM | POA: Diagnosis present

## 2015-04-07 DIAGNOSIS — R293 Abnormal posture: Secondary | ICD-10-CM | POA: Diagnosis present

## 2015-04-07 MED ORDER — OXYCODONE-ACETAMINOPHEN 7.5-325 MG PO TABS: 1.0000 | ORAL_TABLET | Freq: Four times a day (QID) | ORAL | Status: DC | PRN

## 2015-04-07 NOTE — Telephone Encounter (Signed)
Patient called requesting "refill for Percocet she ran out of Sunday (04-03-2015).  I was trying to wait until Monday to get refill but therapist today says I need refill based on the pain I'm experiencing today.  Return number when ready for pick up is [phone]."

## 2015-04-07 NOTE — Therapy (Signed)
[place], [place], [address] Phone: [phone]   Fax:  [phone]  Physical Therapy Evaluation  Patient Details  Name: Andrea [name] MRN: [id number] Date of Birth: [birth year] Referring Provider: Dr. Curt Bears  Encounter Date: 04/07/2015      PT End of Session - 04/07/15 2031    Visit Number 1   Number of Visits 1   PT Start Time 1301   PT Stop Time 1350   PT Time Calculation (min) 49 min   Activity Tolerance Patient tolerated treatment well   Behavior During Therapy [facility] for tasks assessed/performed      Past Medical History  Diagnosis Date  . Low back pain radiating to left leg 11/29/2010  . Dysmenorrhea 12/06/2009    Qualifier: Diagnosis of  By: [name] RN, Regino Schultze    . Chronic back pain   . Family history of adverse reaction to anesthesia     mother stopped breathing after surgery  . Anxiety   . Depression   . Headache     stress  . S/P radiation therapy 01/01/2015 through 01/21/2015      Left iliac bone 3500 cGy in 14 sessions   . Shortness of breath dyspnea     with exertion, Pt denies 02/2015  . Anginal pain ([place])     chest pain not sure what related to  . Non-small cell carcinoma of right lung, stage 4 ([place]) 01/01/2015    Past Surgical History  Procedure Laterality Date  . Laproscopy surgery to check her tubes    . Lumbar laminectomy/decompression microdiscectomy Left 05/01/2014    Procedure: Microdiscectomy - L5-S1 - left;  Surgeon: Eustace Moore, MD;  Location: [place];  Service: Neurosurgery;  Laterality: Left;    There were no vitals filed for this visit.  Visit Diagnosis:  Gait abnormality - Plan: PT plan of care cert/re-cert  Decreased strength, endurance, and mobility - Plan: PT plan of care cert/re-cert  Posture abnormality - Plan: PT plan of care cert/re-cert  Pain in joint involving left pelvic  region and thigh - Plan: PT plan of care cert/re-cert      Subjective Assessment - 04/07/15 1314    Subjective Was supposed to come when I got out of the hospital 01/04/15 and couldn't walk; is doing some better now.   Pertinent History h/o back problems with surgery with discectomy at L5-S1.  Had tried therapy for her back. Back pain continued through all that and then got worse.  Diagnosed 11/02/14 with an MRI showing a mass on her ilium; found to be stage IV lung cancer.  Has had three chemo treatments and will have a CT at the end of December to see how things are going.  Has had radiation therapy as well.   Patient Stated Goals be more mobile, be stronger   Currently in Pain? Yes   Pain Score 8    Pain Location Buttocks  and low back   Pain Orientation Left   Pain Descriptors / Indicators Aching;Sharp   Aggravating Factors  being up on her feet   Pain Relieving Factors has to lean away from left hip            [facility] PT Assessment - 04/07/15 0001    Assessment   Medical Diagnosis stage IV lung cancer with bone metastases   Referring Provider Dr. Curt Bears   Precautions   Precautions Other (comment)   Precaution Comments bony metastases in left  ilium   Restrictions   Weight Bearing Restrictions No   Balance Screen   Has the patient fallen in the past 6 months Yes   How many times? 4  before September   Has the patient had a decrease in activity level because of a fear of falling?  No   Is the patient reluctant to leave their home because of a fear of falling?  No   Home Environment   Living Environment Other (Comment)  living in a motel currently; moving to apt. 04/26/15   Prior Function   Level of Independence Independent   Vocation On disability   Leisure tries to be active, cook for herself   Cognition   Overall Cognitive Status Within Functional Limits for tasks assessed   Observation/Other Assessments   Observations pleasant woman who sits leaning way over to  the right.  Left leg viewed with patient's jeans on appears atrophied compared to right, and she reports this is the case.   Posture/Postural Control   Posture/Postural Control Postural limitations   Postural Limitations Forward head  leans right in sitting   ROM / Strength   AROM / PROM / Strength AROM;Strength   AROM   Overall AROM Comments LEs grossly WFL bilat.   Strength   Overall Strength Comments Right LE grossly 5/5; left grossly 4/5 with these exceptions: hip not tested with resistance due to bony metastases at ilium, but is grossly 3/5, hamstrings and ankle evertors 3+/5,    Ambulation/Gait   Ambulation/Gait Yes   Ambulation/Gait Assistance 6: Modified independent (Device/Increase time)   Assistive device Crutches;Straight cane   Gait Pattern Trendelenburg  left   Stairs --  not observed; says she can do a few steps occasionally           LYMPHEDEMA/ONCOLOGY QUESTIONNAIRE - 04/07/15 2030    Type   Cancer Type lung cancer, stage IV, with bony metastases at left ilium   Treatment   Active Chemotherapy Treatment Yes   Past Radiation Treatment Yes                                Long Term Clinic Goals - 04/07/15 2042    CC Long Term Goal  #1   Title Patient will be knowledgeable about the availability of physical therapy services without cost at [facility] clinic in [place].   Status Achieved            Plan - 04/07/15 2032    Clinical Impression Statement Patient with stage IV lung cancer discovered after several years' of complaints of back pain. She did have back surgery.  She was found to have bony metastases to left ilium, with a smaller primary tumor in the lung.  She has been receiving treatment for a couple of months and is improving.  However, she has significant pain at low back and left buttocks area, so that she is unable to tolerate sitting with weight on that side.  She has visible (through her jeans) atrophy of the left leg when  compared to the right, has a gait deviation (Trendelenberg), and difficulty with functional mobility including climbing stairs.     Pt will benefit from skilled therapeutic intervention in order to improve on the following deficits Abnormal gait;Decreased strength;Decreased activity tolerance;Pain   Rehab Potential Fair   Clinical Impairments Affecting Rehab Potential active cancer   PT Frequency One time visit   PT Treatment/Interventions Patient/family education  PT Next Visit Plan The patient has Medicaid, which will not cover physical therapy for her, and she does not want to incur the cost of payment out of pocket.  She was given information about the free physical therapy clinic in [facility]), and she would like to pursue this option.  She signed a release form; her information will be sent to that clinic.   Recommended Other Services [facility] (free physical therapy) Clinic in [place].   Consulted and Agree with Plan of Care Patient         Problem List Patient Active Problem List   Diagnosis Date Noted  . Malnutrition ([place]) 01/25/2015  . Non-small cell carcinoma of right lung, stage 4 ([place]) 01/01/2015  . Bone metastases ([place]) 12/30/2014  . Uncontrolled pain 12/29/2014  . Mass of left hip region 12/29/2014  . Hypokalemia 12/29/2014  . Bone mass   . Abnormal MRI 11/30/2014  . Lumbar disc prolapse with compression radiculopathy 08/18/2014  . S/P lumbar laminectomy 05/01/2014  . Tachycardia 12/04/2013  . Annular disc tear 07/09/2013  . Depression 05/14/2013  . Depo-Provera contraceptive status 01/15/2012  . Tobacco abuse 10/11/2011  . Degenerative disc disease, lumbar 11/29/2010  . Dysmenorrhea 12/06/2009    SALISBURY,DONNA 04/07/2015, 8:46 PM  [place], [place], [address] Phone: [phone]   Fax:  [phone]  Name: Andrea [name] MRN: [id number] Date of Birth: 1971-03-11   Serafina Royals, PT 04/07/2015 8:46 PM

## 2015-04-07 NOTE — Telephone Encounter (Signed)
Pt called request for refill on percocet. Will review with MD

## 2015-04-08 NOTE — Telephone Encounter (Signed)
Patient called asking if yesterday's request was received and is script ready.  Collaborative nurse will call patient when script ready.    With this call she reports she "has bulges in her back.  Unknown what these are.  Because I have Medicaid, cannot continue therapy.  Therapy ordered to help stregnthen legs due to decreased muscle mass.  They will work on a free clinic in [place] while she is waiting to here from Dr. Ronnald Ramp what will be done next to L1 - L4."

## 2015-04-09 NOTE — Telephone Encounter (Signed)
Rx for percocet ready for pick up. Called pt, unable to reach LM on pt vm.

## 2015-04-12 ENCOUNTER — Other Ambulatory Visit: Payer: Self-pay | Admitting: *Deleted

## 2015-04-12 ENCOUNTER — Ambulatory Visit (HOSPITAL_BASED_OUTPATIENT_CLINIC_OR_DEPARTMENT_OTHER): Payer: Medicaid Other

## 2015-04-12 ENCOUNTER — Other Ambulatory Visit (HOSPITAL_BASED_OUTPATIENT_CLINIC_OR_DEPARTMENT_OTHER): Payer: Medicaid Other

## 2015-04-12 VITALS — BP 103/77 | HR 108 | Temp 98.2°F | Resp 16

## 2015-04-12 DIAGNOSIS — C7951 Secondary malignant neoplasm of bone: Secondary | ICD-10-CM

## 2015-04-12 DIAGNOSIS — C3491 Malignant neoplasm of unspecified part of right bronchus or lung: Secondary | ICD-10-CM

## 2015-04-12 DIAGNOSIS — Z95828 Presence of other vascular implants and grafts: Secondary | ICD-10-CM

## 2015-04-12 LAB — CBC WITH DIFFERENTIAL/PLATELET
BASO%: 0.6 % (ref 0.0–2.0)
BASOS ABS: 0 10*3/uL (ref 0.0–0.1)
EOS ABS: 0 10*3/uL (ref 0.0–0.5)
EOS%: 1.4 % (ref 0.0–7.0)
HCT: 33 % — ABNORMAL LOW (ref 34.8–46.6)
HEMOGLOBIN: 11 g/dL — AB (ref 11.6–15.9)
LYMPH%: 38.7 % (ref 14.0–49.7)
MCH: 33.4 pg (ref 25.1–34.0)
MCHC: 33.4 g/dL (ref 31.5–36.0)
MCV: 100 fL (ref 79.5–101.0)
MONO#: 0.2 10*3/uL (ref 0.1–0.9)
MONO%: 10.8 % (ref 0.0–14.0)
NEUT%: 48.5 % (ref 38.4–76.8)
NEUTROS ABS: 0.9 10*3/uL — AB (ref 1.5–6.5)
PLATELETS: 238 10*3/uL (ref 145–400)
RBC: 3.3 10*6/uL — AB (ref 3.70–5.45)
RDW: 19.4 % — ABNORMAL HIGH (ref 11.2–14.5)
WBC: 1.9 10*3/uL — AB (ref 3.9–10.3)
lymph#: 0.8 10*3/uL — ABNORMAL LOW (ref 0.9–3.3)

## 2015-04-12 LAB — COMPREHENSIVE METABOLIC PANEL
ALBUMIN: 3.8 g/dL (ref 3.5–5.0)
ALK PHOS: 81 U/L (ref 40–150)
ALT: 32 U/L (ref 0–55)
ANION GAP: 7 meq/L (ref 3–11)
AST: 24 U/L (ref 5–34)
BILIRUBIN TOTAL: 0.31 mg/dL (ref 0.20–1.20)
BUN: 11.1 mg/dL (ref 7.0–26.0)
CO2: 25 mEq/L (ref 22–29)
Calcium: 9.3 mg/dL (ref 8.4–10.4)
Chloride: 108 mEq/L (ref 98–109)
Creatinine: 0.9 mg/dL (ref 0.6–1.1)
Glucose: 110 mg/dl (ref 70–140)
POTASSIUM: 3.8 meq/L (ref 3.5–5.1)
Sodium: 140 mEq/L (ref 136–145)
TOTAL PROTEIN: 7.2 g/dL (ref 6.4–8.3)

## 2015-04-12 MED ORDER — HEPARIN SOD (PORK) LOCK FLUSH 100 UNIT/ML IV SOLN
500.0000 [IU] | Freq: Once | INTRAVENOUS | Status: AC
  Administered 2015-04-12: 500 [IU] via INTRAVENOUS
  Filled 2015-04-12: qty 5

## 2015-04-12 MED ORDER — SODIUM CHLORIDE 0.9 % IJ SOLN
10.0000 mL | INTRAMUSCULAR | Status: DC | PRN
  Administered 2015-04-12: 10 mL via INTRAVENOUS
  Filled 2015-04-12: qty 10

## 2015-04-12 NOTE — Patient Instructions (Signed)

## 2015-04-16 ENCOUNTER — Other Ambulatory Visit: Payer: Self-pay | Admitting: Medical Oncology

## 2015-04-16 DIAGNOSIS — C3491 Malignant neoplasm of unspecified part of right bronchus or lung: Secondary | ICD-10-CM

## 2015-04-20 ENCOUNTER — Ambulatory Visit (HOSPITAL_BASED_OUTPATIENT_CLINIC_OR_DEPARTMENT_OTHER): Payer: Medicaid Other

## 2015-04-20 ENCOUNTER — Other Ambulatory Visit (HOSPITAL_BASED_OUTPATIENT_CLINIC_OR_DEPARTMENT_OTHER): Payer: Medicaid Other

## 2015-04-20 DIAGNOSIS — C3491 Malignant neoplasm of unspecified part of right bronchus or lung: Secondary | ICD-10-CM

## 2015-04-20 DIAGNOSIS — Z95828 Presence of other vascular implants and grafts: Secondary | ICD-10-CM

## 2015-04-20 LAB — CBC WITH DIFFERENTIAL/PLATELET
BASO%: 0.6 % (ref 0.0–2.0)
Basophils Absolute: 0 10*3/uL (ref 0.0–0.1)
EOS ABS: 0 10*3/uL (ref 0.0–0.5)
EOS%: 1.2 % (ref 0.0–7.0)
HCT: 32.3 % — ABNORMAL LOW (ref 34.8–46.6)
HEMOGLOBIN: 10.7 g/dL — AB (ref 11.6–15.9)
LYMPH%: 32 % (ref 14.0–49.7)
MCH: 34 pg (ref 25.1–34.0)
MCHC: 33.2 g/dL (ref 31.5–36.0)
MCV: 102.3 fL — ABNORMAL HIGH (ref 79.5–101.0)
MONO#: 0.5 10*3/uL (ref 0.1–0.9)
MONO%: 16.3 % — AB (ref 0.0–14.0)
NEUT%: 49.9 % (ref 38.4–76.8)
NEUTROS ABS: 1.6 10*3/uL (ref 1.5–6.5)
Platelets: 180 10*3/uL (ref 145–400)
RBC: 3.15 10*6/uL — AB (ref 3.70–5.45)
RDW: 20.8 % — AB (ref 11.2–14.5)
WBC: 3.1 10*3/uL — AB (ref 3.9–10.3)
lymph#: 1 10*3/uL (ref 0.9–3.3)

## 2015-04-20 LAB — COMPREHENSIVE METABOLIC PANEL
ALBUMIN: 3.7 g/dL (ref 3.5–5.0)
ALK PHOS: 78 U/L (ref 40–150)
ALT: 63 U/L — AB (ref 0–55)
AST: 35 U/L — AB (ref 5–34)
Anion Gap: 9 mEq/L (ref 3–11)
BUN: 7.4 mg/dL (ref 7.0–26.0)
CO2: 24 meq/L (ref 22–29)
Calcium: 9.3 mg/dL (ref 8.4–10.4)
Chloride: 107 mEq/L (ref 98–109)
Creatinine: 0.9 mg/dL (ref 0.6–1.1)
EGFR: >90 mL/min/{1.73_m2} (ref >90)
GLUCOSE: 113 mg/dL (ref 70–140)
Potassium: 3.9 mEq/L (ref 3.5–5.1)
SODIUM: 140 meq/L (ref 136–145)
TOTAL PROTEIN: 7.1 g/dL (ref 6.4–8.3)

## 2015-04-20 MED ORDER — HEPARIN SOD (PORK) LOCK FLUSH 100 UNIT/ML IV SOLN
500.0000 [IU] | Freq: Once | INTRAVENOUS | Status: AC
  Administered 2015-04-20: 500 [IU] via INTRAVENOUS
  Filled 2015-04-20: qty 5

## 2015-04-20 MED ORDER — SODIUM CHLORIDE 0.9 % IJ SOLN
10.0000 mL | INTRAMUSCULAR | Status: DC | PRN
  Administered 2015-04-20: 10 mL via INTRAVENOUS
  Filled 2015-04-20: qty 10

## 2015-04-20 NOTE — Patient Instructions (Signed)

## 2015-04-23 ENCOUNTER — Telehealth: Payer: Self-pay | Admitting: *Deleted

## 2015-04-23 ENCOUNTER — Telehealth: Payer: Self-pay | Admitting: Internal Medicine

## 2015-04-23 ENCOUNTER — Other Ambulatory Visit: Payer: Self-pay | Admitting: *Deleted

## 2015-04-23 ENCOUNTER — Ambulatory Visit (HOSPITAL_COMMUNITY): Payer: Medicaid Other

## 2015-04-23 DIAGNOSIS — C7951 Secondary malignant neoplasm of bone: Secondary | ICD-10-CM

## 2015-04-23 DIAGNOSIS — C3491 Malignant neoplasm of unspecified part of right bronchus or lung: Secondary | ICD-10-CM

## 2015-04-23 NOTE — Telephone Encounter (Signed)
Called patient and she is aware of her new appointments

## 2015-04-23 NOTE — Telephone Encounter (Signed)
  Pt's CT scan r/s to 04/28/14. Per MD pt lab, MD, Chemo appt will need to be pushed out until after scan. POF sent. Called pt to discuss above information.  Pt had concern if MD received ppwk from her dentist who did not have any recent xrays of pt. Pt advised she was being seen at [facility]. Pt advised the dental clinic told her to have MD scan her jaw for clearance to receive Xgeva as pt does not have any recent xrays of her jaw.  Attempt made to reach pt's dentist- office is closed until 04/26/14 Per MD if unable to get Dental clearance for pt, she will need to be referred to Dr. Enrique Sack. Referral entered.

## 2015-04-27 ENCOUNTER — Ambulatory Visit (HOSPITAL_BASED_OUTPATIENT_CLINIC_OR_DEPARTMENT_OTHER): Payer: Medicaid Other

## 2015-04-27 ENCOUNTER — Encounter: Payer: Medicaid Other | Admitting: Nutrition

## 2015-04-27 ENCOUNTER — Other Ambulatory Visit (HOSPITAL_BASED_OUTPATIENT_CLINIC_OR_DEPARTMENT_OTHER): Payer: Medicaid Other

## 2015-04-27 ENCOUNTER — Ambulatory Visit: Payer: No Typology Code available for payment source

## 2015-04-27 ENCOUNTER — Other Ambulatory Visit: Payer: No Typology Code available for payment source

## 2015-04-27 ENCOUNTER — Ambulatory Visit: Payer: No Typology Code available for payment source | Admitting: Internal Medicine

## 2015-04-27 VITALS — BP 126/93 | HR 90 | Temp 97.3°F | Resp 16

## 2015-04-27 DIAGNOSIS — Z95828 Presence of other vascular implants and grafts: Secondary | ICD-10-CM

## 2015-04-27 DIAGNOSIS — Z452 Encounter for adjustment and management of vascular access device: Secondary | ICD-10-CM | POA: Diagnosis not present

## 2015-04-27 DIAGNOSIS — C3491 Malignant neoplasm of unspecified part of right bronchus or lung: Secondary | ICD-10-CM

## 2015-04-27 DIAGNOSIS — C7951 Secondary malignant neoplasm of bone: Secondary | ICD-10-CM

## 2015-04-27 LAB — COMPREHENSIVE METABOLIC PANEL
ALT: 44 U/L (ref 0–55)
ANION GAP: 8 meq/L (ref 3–11)
AST: 29 U/L (ref 5–34)
Albumin: 3.7 g/dL (ref 3.5–5.0)
Alkaline Phosphatase: 76 U/L (ref 40–150)
BUN: 6.7 mg/dL — ABNORMAL LOW (ref 7.0–26.0)
CALCIUM: 9 mg/dL (ref 8.4–10.4)
CHLORIDE: 110 meq/L — AB (ref 98–109)
CO2: 23 mEq/L (ref 22–29)
Creatinine: 0.9 mg/dL (ref 0.6–1.1)
EGFR: 89 mL/min/{1.73_m2} — AB (ref >90)
Glucose: 121 mg/dl (ref 70–140)
POTASSIUM: 3.7 meq/L (ref 3.5–5.1)
Sodium: 140 mEq/L (ref 136–145)
Total Bilirubin: 0.42 mg/dL (ref 0.20–1.20)
Total Protein: 7 g/dL (ref 6.4–8.3)

## 2015-04-27 LAB — CBC WITH DIFFERENTIAL/PLATELET
BASO%: 0.4 % (ref 0.0–2.0)
Basophils Absolute: 0 10e3/uL (ref 0.0–0.1)
EOS%: 2.4 % (ref 0.0–7.0)
Eosinophils Absolute: 0.1 10e3/uL (ref 0.0–0.5)
HCT: 31.5 % — ABNORMAL LOW (ref 34.8–46.6)
HGB: 10.7 g/dL — ABNORMAL LOW (ref 11.6–15.9)
LYMPH%: 32.8 % (ref 14.0–49.7)
MCH: 34.6 pg — ABNORMAL HIGH (ref 25.1–34.0)
MCHC: 34 g/dL (ref 31.5–36.0)
MCV: 101.9 fL — ABNORMAL HIGH (ref 79.5–101.0)
MONO#: 0.3 10e3/uL (ref 0.1–0.9)
MONO%: 13.8 % (ref 0.0–14.0)
NEUT#: 1.3 10e3/uL — ABNORMAL LOW (ref 1.5–6.5)
NEUT%: 50.6 % (ref 38.4–76.8)
Platelets: 173 10e3/uL (ref 145–400)
RBC: 3.09 10e6/uL — ABNORMAL LOW (ref 3.70–5.45)
RDW: 19.7 % — ABNORMAL HIGH (ref 11.2–14.5)
WBC: 2.5 10e3/uL — ABNORMAL LOW (ref 3.9–10.3)
lymph#: 0.8 10e3/uL — ABNORMAL LOW (ref 0.9–3.3)

## 2015-04-27 MED ORDER — SODIUM CHLORIDE 0.9 % IJ SOLN
10.0000 mL | INTRAMUSCULAR | Status: DC | PRN
  Administered 2015-04-27: 10 mL via INTRAVENOUS
  Filled 2015-04-27: qty 10

## 2015-04-27 MED ORDER — HEPARIN SOD (PORK) LOCK FLUSH 100 UNIT/ML IV SOLN
500.0000 [IU] | Freq: Once | INTRAVENOUS | Status: AC
  Administered 2015-04-27: 500 [IU] via INTRAVENOUS
  Filled 2015-04-27: qty 5

## 2015-04-27 MED FILL — LIDOCAINE-PRILOCAINE CREAM: 2.5-2.5 | 30 days supply | Qty: 30 | Fill #1

## 2015-04-27 MED FILL — FOLIC ACID 1 MG TABLET: 1 | 30 days supply | Qty: 30 | Fill #2

## 2015-04-27 NOTE — Patient Instructions (Signed)

## 2015-04-29 ENCOUNTER — Ambulatory Visit (HOSPITAL_COMMUNITY)
Admission: RE | Admit: 2015-04-29 | Discharge: 2015-04-29 | Disposition: A | Discharge status: Home / Self Care | Payer: Medicaid Other | Source: Ambulatory Visit | Attending: Internal Medicine | Admitting: Internal Medicine

## 2015-04-29 ENCOUNTER — Encounter (HOSPITAL_COMMUNITY): Payer: Self-pay

## 2015-04-29 DIAGNOSIS — C3491 Malignant neoplasm of unspecified part of right bronchus or lung: Secondary | ICD-10-CM | POA: Insufficient documentation

## 2015-04-29 DIAGNOSIS — Z9221 Personal history of antineoplastic chemotherapy: Secondary | ICD-10-CM | POA: Insufficient documentation

## 2015-04-29 DIAGNOSIS — C7951 Secondary malignant neoplasm of bone: Secondary | ICD-10-CM | POA: Diagnosis present

## 2015-04-29 DIAGNOSIS — R52 Pain, unspecified: Secondary | ICD-10-CM | POA: Insufficient documentation

## 2015-04-29 DIAGNOSIS — J439 Emphysema, unspecified: Secondary | ICD-10-CM | POA: Insufficient documentation

## 2015-04-29 MED ORDER — IOHEXOL 300 MG/ML  SOLN
50.0000 mL | Freq: Once | INTRAMUSCULAR | Status: DC | PRN
  Administered 2015-04-29: 50 mL via ORAL
  Filled 2015-04-29: qty 50

## 2015-04-29 MED ORDER — IOHEXOL 300 MG/ML  SOLN
100.0000 mL | Freq: Once | INTRAMUSCULAR | Status: AC | PRN
  Administered 2015-04-29: 100 mL via INTRAVENOUS

## 2015-04-30 ENCOUNTER — Other Ambulatory Visit: Payer: Self-pay | Admitting: *Deleted

## 2015-04-30 DIAGNOSIS — C3491 Malignant neoplasm of unspecified part of right bronchus or lung: Secondary | ICD-10-CM

## 2015-05-03 ENCOUNTER — Other Ambulatory Visit: Payer: No Typology Code available for payment source

## 2015-05-03 ENCOUNTER — Other Ambulatory Visit: Payer: Medicaid Other

## 2015-05-03 ENCOUNTER — Ambulatory Visit: Payer: Self-pay | Admitting: Internal Medicine

## 2015-05-03 ENCOUNTER — Ambulatory Visit: Payer: Medicaid Other

## 2015-05-03 ENCOUNTER — Encounter: Payer: Medicaid Other | Admitting: Nutrition

## 2015-05-03 ENCOUNTER — Telehealth: Payer: Self-pay | Admitting: *Deleted

## 2015-05-03 NOTE — Telephone Encounter (Signed)
Patient called and canceled her appts due to weather. Desk RN notified

## 2015-05-04 ENCOUNTER — Telehealth (HOSPITAL_COMMUNITY): Payer: Self-pay

## 2015-05-04 NOTE — Telephone Encounter (Signed)
05/04/2015               Called to confirm Dental Consult appt. on 05/05/15 w/Dr. Enrique Sack.  Patient stated due to weather/transportation will need to reschedule.  Rescheduled Dental Consult appt. to 05/10/15 @ 8:00.  LRI

## 2015-05-05 ENCOUNTER — Other Ambulatory Visit: Payer: Self-pay | Admitting: *Deleted

## 2015-05-05 ENCOUNTER — Other Ambulatory Visit (HOSPITAL_COMMUNITY): Payer: Medicaid Other | Admitting: Dentistry

## 2015-05-05 NOTE — Progress Notes (Signed)
Pt missed 1/9 appts due to snow. POF to scheduling to r/s. Per MD - pt to see Selena Lesser, NP prior to chemo appt.

## 2015-05-07 ENCOUNTER — Telehealth: Payer: Self-pay | Admitting: Internal Medicine

## 2015-05-07 NOTE — Telephone Encounter (Signed)
Per 1/9 pof r/s missed 1/9 appointments for this week or early next week. Spoke with patient re next appointment for 1/17 and adjusted remaining appointments accordingly - patient will get new schedule 1/17.

## 2015-05-10 ENCOUNTER — Other Ambulatory Visit (HOSPITAL_COMMUNITY): Payer: Medicaid Other | Admitting: Dentistry

## 2015-05-10 ENCOUNTER — Other Ambulatory Visit: Payer: No Typology Code available for payment source

## 2015-05-11 ENCOUNTER — Other Ambulatory Visit (HOSPITAL_BASED_OUTPATIENT_CLINIC_OR_DEPARTMENT_OTHER): Payer: Medicaid Other

## 2015-05-11 ENCOUNTER — Encounter (HOSPITAL_COMMUNITY): Payer: Self-pay | Admitting: Dentistry

## 2015-05-11 ENCOUNTER — Other Ambulatory Visit: Payer: Self-pay | Admitting: Medical Oncology

## 2015-05-11 ENCOUNTER — Encounter: Payer: Self-pay | Admitting: Nurse Practitioner

## 2015-05-11 ENCOUNTER — Ambulatory Visit (HOSPITAL_COMMUNITY): Payer: Medicaid - Dental | Admitting: Dentistry

## 2015-05-11 ENCOUNTER — Ambulatory Visit (HOSPITAL_BASED_OUTPATIENT_CLINIC_OR_DEPARTMENT_OTHER): Payer: Medicaid Other

## 2015-05-11 ENCOUNTER — Ambulatory Visit (HOSPITAL_BASED_OUTPATIENT_CLINIC_OR_DEPARTMENT_OTHER): Payer: Medicaid Other | Admitting: Nurse Practitioner

## 2015-05-11 ENCOUNTER — Telehealth: Payer: Self-pay | Admitting: *Deleted

## 2015-05-11 ENCOUNTER — Ambulatory Visit: Payer: Medicaid Other

## 2015-05-11 VITALS — BP 116/66 | HR 70 | Temp 98.3°F

## 2015-05-11 VITALS — BP 132/66 | HR 85 | Temp 98.2°F | Resp 18 | Ht 66.0 in | Wt 156.5 lb

## 2015-05-11 VITALS — Wt 156.0 lb

## 2015-05-11 DIAGNOSIS — K060 Gingival recession: Secondary | ICD-10-CM

## 2015-05-11 DIAGNOSIS — G893 Neoplasm related pain (acute) (chronic): Secondary | ICD-10-CM | POA: Diagnosis not present

## 2015-05-11 DIAGNOSIS — M2630 Unspecified anomaly of tooth position of fully erupted tooth or teeth: Secondary | ICD-10-CM

## 2015-05-11 DIAGNOSIS — C3491 Malignant neoplasm of unspecified part of right bronchus or lung: Secondary | ICD-10-CM | POA: Diagnosis not present

## 2015-05-11 DIAGNOSIS — K08409 Partial loss of teeth, unspecified cause, unspecified class: Secondary | ICD-10-CM

## 2015-05-11 DIAGNOSIS — M2632 Excessive spacing of fully erupted teeth: Secondary | ICD-10-CM

## 2015-05-11 DIAGNOSIS — R110 Nausea: Secondary | ICD-10-CM | POA: Diagnosis not present

## 2015-05-11 DIAGNOSIS — C7951 Secondary malignant neoplasm of bone: Secondary | ICD-10-CM

## 2015-05-11 DIAGNOSIS — K0530 Chronic periodontitis, unspecified: Secondary | ICD-10-CM

## 2015-05-11 DIAGNOSIS — K0401 Reversible pulpitis: Secondary | ICD-10-CM

## 2015-05-11 DIAGNOSIS — Z5111 Encounter for antineoplastic chemotherapy: Secondary | ICD-10-CM

## 2015-05-11 DIAGNOSIS — IMO0002 Reserved for concepts with insufficient information to code with codable children: Secondary | ICD-10-CM

## 2015-05-11 DIAGNOSIS — Z95828 Presence of other vascular implants and grafts: Secondary | ICD-10-CM

## 2015-05-11 DIAGNOSIS — M270 Developmental disorders of jaws: Secondary | ICD-10-CM

## 2015-05-11 DIAGNOSIS — M2629 Other anomalies of dental arch relationship: Secondary | ICD-10-CM

## 2015-05-11 DIAGNOSIS — M264 Malocclusion, unspecified: Secondary | ICD-10-CM

## 2015-05-11 DIAGNOSIS — K036 Deposits [accretions] on teeth: Secondary | ICD-10-CM

## 2015-05-11 LAB — CBC WITH DIFFERENTIAL/PLATELET
BASO%: 0.4 % (ref 0.0–2.0)
BASOS ABS: 0 10*3/uL (ref 0.0–0.1)
EOS ABS: 0 10*3/uL (ref 0.0–0.5)
EOS%: 0 % (ref 0.0–7.0)
HCT: 33.5 % — ABNORMAL LOW (ref 34.8–46.6)
HEMOGLOBIN: 11.5 g/dL — AB (ref 11.6–15.9)
LYMPH%: 11.2 % — ABNORMAL LOW (ref 14.0–49.7)
MCH: 36.1 pg — AB (ref 25.1–34.0)
MCHC: 34.3 g/dL (ref 31.5–36.0)
MCV: 105.3 fL — AB (ref 79.5–101.0)
MONO#: 0.1 10*3/uL (ref 0.1–0.9)
MONO%: 1.7 % (ref 0.0–14.0)
NEUT%: 86.7 % — ABNORMAL HIGH (ref 38.4–76.8)
NEUTROS ABS: 4.5 10*3/uL (ref 1.5–6.5)
PLATELETS: 309 10*3/uL (ref 145–400)
RBC: 3.18 10*6/uL — ABNORMAL LOW (ref 3.70–5.45)
RDW: 20.9 % — AB (ref 11.2–14.5)
WBC: 5.2 10*3/uL (ref 3.9–10.3)
lymph#: 0.6 10*3/uL — ABNORMAL LOW (ref 0.9–3.3)

## 2015-05-11 LAB — COMPREHENSIVE METABOLIC PANEL
ALBUMIN: 3.9 g/dL (ref 3.5–5.0)
ALK PHOS: 81 U/L (ref 40–150)
ALT: 34 U/L (ref 0–55)
ANION GAP: 9 meq/L (ref 3–11)
AST: 28 U/L (ref 5–34)
BUN: 7.7 mg/dL (ref 7.0–26.0)
CALCIUM: 9.9 mg/dL (ref 8.4–10.4)
CO2: 21 mEq/L — ABNORMAL LOW (ref 22–29)
Chloride: 108 mEq/L (ref 98–109)
Creatinine: 1 mg/dL (ref 0.6–1.1)
EGFR: 78 mL/min/{1.73_m2} — AB (ref >90)
Glucose: 207 mg/dl — ABNORMAL HIGH (ref 70–140)
POTASSIUM: 3.9 meq/L (ref 3.5–5.1)
Sodium: 138 mEq/L (ref 136–145)
TOTAL PROTEIN: 7.7 g/dL (ref 6.4–8.3)

## 2015-05-11 LAB — TECHNOLOGIST REVIEW

## 2015-05-11 MED ORDER — CARBOPLATIN CHEMO INJECTION 600 MG/60ML
520.0000 mg | Freq: Once | INTRAVENOUS | Status: AC
  Administered 2015-05-11: 520 mg via INTRAVENOUS
  Filled 2015-05-11: qty 52

## 2015-05-11 MED ORDER — SODIUM CHLORIDE 0.9 % IJ SOLN
10.0000 mL | INTRAMUSCULAR | Status: DC | PRN
  Administered 2015-05-11: 10 mL via INTRAVENOUS
  Filled 2015-05-11: qty 10

## 2015-05-11 MED ORDER — SODIUM CHLORIDE 0.9 % IV SOLN
400.0000 mg/m2 | Freq: Once | INTRAVENOUS | Status: AC
  Administered 2015-05-11: 650 mg via INTRAVENOUS
  Filled 2015-05-11: qty 26

## 2015-05-11 MED ORDER — OXYCODONE-ACETAMINOPHEN 7.5-325 MG PO TABS: 1.0000 | ORAL_TABLET | Freq: Four times a day (QID) | ORAL | Status: DC | PRN

## 2015-05-11 MED ORDER — SODIUM CHLORIDE 0.9 % IV SOLN
Freq: Once | INTRAVENOUS | Status: AC
  Administered 2015-05-11: 12:00:00 via INTRAVENOUS

## 2015-05-11 MED ORDER — SODIUM CHLORIDE 0.9 % IJ SOLN
10.0000 mL | INTRAMUSCULAR | Status: DC | PRN
  Administered 2015-05-11: 10 mL
  Filled 2015-05-11: qty 10

## 2015-05-11 MED ORDER — HEPARIN SOD (PORK) LOCK FLUSH 100 UNIT/ML IV SOLN
500.0000 [IU] | Freq: Once | INTRAVENOUS | Status: AC | PRN
  Administered 2015-05-11: 500 [IU]
  Filled 2015-05-11: qty 5

## 2015-05-11 MED ORDER — SODIUM CHLORIDE 0.9 % IV SOLN
Freq: Once | INTRAVENOUS | Status: AC
  Administered 2015-05-11: 13:00:00 via INTRAVENOUS
  Filled 2015-05-11: qty 8

## 2015-05-11 MED FILL — OXYCODONE-APAP 7.5-325 MG: 7.5-325 | 15 days supply | Qty: 60 | Fill #0

## 2015-05-11 NOTE — Assessment & Plan Note (Signed)
Patient states that she did experience some mild, intermittent nausea following her last cycle of chemotherapy.  She denies any vomiting.  She confirmed that she does already have anti-nausea medication at home.

## 2015-05-11 NOTE — Patient Instructions (Signed)

## 2015-05-11 NOTE — Assessment & Plan Note (Signed)
Patient continues to complain of left iliac bone pain.  She requested refill of her Percocet 7.5/325 pain medication today.  Refill was given.  Also, patient continues with some chronic mid to lower back pain as well.  She states that she's been having difficulties communicating with her neurosurgeon, Dr. Sherley Bounds office.  Advised patient would have [place] call/communicate with neurosurgeon's office for follow-up.

## 2015-05-11 NOTE — Progress Notes (Signed)
SYMPTOM MANAGEMENT CLINIC   HPI: Andrea [name] 45 y.o. female diagnosed with lung cancer; with bone metastasis.  Currently undergoing carboplatin/Alimta chemotherapy regimen.  The plan is to initiate Xgeva therapy at a later date.   Patient received cycle 3 of her carboplatin/Alimta chemotherapy regimen on 04/05/2015.  She presents to the [facility] today to receive cycle 4 of the same regimen.  Patient states that she did fairly well with her last cycle of chemotherapy.  She complains of some mild nausea only.  She is requesting a refill of her pain medicine today.  Patient is requesting assistance communicating with her neurosurgeon regarding her chronic back pain.  Also, patient states that she saw Dr. Enrique Sack dental oncologist in regards to [facility] clearance.  Patient states that she was informed that she needed to upper back teeth extracted prior to her undergoing Xgeva therapy.  Patient is scheduled to return for labs on both 05/18/2015 and 05/25/2015.  She is scheduled to return for labs, visit, and cycle 4 of her chemotherapy on 05/31/2015  HPI  Review of Systems  Gastrointestinal: Positive for nausea.  Musculoskeletal: Positive for back pain.  Neurological: Negative for sensory change.  All other systems reviewed and are negative.   Past Medical History  Diagnosis Date  . Low back pain radiating to left leg 11/29/2010  . Dysmenorrhea 12/06/2009    Qualifier: Diagnosis of  By: [name] RN, Regino Schultze    . Chronic back pain   . Family history of adverse reaction to anesthesia     mother stopped breathing after surgery  . Anxiety   . Depression   . Headache     stress  . S/P radiation therapy 01/01/2015 through 01/21/2015      Left iliac bone 3500 cGy in 14 sessions   . Shortness of breath dyspnea     with exertion, Pt denies 02/2015  . Anginal pain ([place])     chest pain not sure what related to  .  Non-small cell carcinoma of right lung, stage 4 ([place]) 01/01/2015    Past Surgical History  Procedure Laterality Date  . Laproscopy surgery to check her tubes    . Lumbar laminectomy/decompression microdiscectomy Left 05/01/2014    Procedure: Microdiscectomy - L5-S1 - left;  Surgeon: Eustace Moore, MD;  Location: [facility];  Service: Neurosurgery;  Laterality: Left;    has Dysmenorrhea; Degenerative disc disease, lumbar; Tobacco abuse; Depo-Provera contraceptive status; Depression; Annular disc tear; Tachycardia; S/P lumbar laminectomy; Lumbar disc prolapse with compression radiculopathy; Abnormal MRI; Bone mass; Uncontrolled pain; Mass of left hip region; Hypokalemia; Bone metastases ([place]); Non-small cell carcinoma of right lung, stage 4 ([place]); Malnutrition ([place]); Cancer associated pain; and Nausea without vomiting on her problem list.    is allergic to morphine and related and hydrocodone.    Medication List       This list is accurate as of: 05/11/15  5:07 PM.  Always use your most recent med list.               cyanocobalamin 1000 MCG/ML injection  Commonly known as:  (VITAMIN B-12)  Inject 1,000 mcg into the muscle once.     dexamethasone 4 MG tablet  Commonly known as:  DECADRON  4 mg po bid, the day before, day of and day after chemo     feeding supplement (ENSURE ENLIVE) Liqd  Take 237 mLs by mouth 3 (three) times daily between meals.     folic acid 1 MG  tablet  Commonly known as:  FOLVITE  Take 1 tablet (1 mg total) by mouth daily.     lidocaine-prilocaine cream  Commonly known as:  EMLA  Apply 1 teaspoon to skin 1 to 2 hours before use cover to keep in contact with skin     MULTIVITAMIN & MINERAL PO  Take 2 tablets by mouth daily.     oxyCODONE-acetaminophen 7.5-325 MG tablet  Commonly known as:  PERCOCET  Take 1 tablet by mouth every 6 (six) hours as needed for severe pain.     polyethylene glycol packet  Commonly known as:  MIRALAX / GLYCOLAX  Take 17 g by mouth  daily as needed for mild constipation.     PRENATAL VITAMIN PO  Take 1 tablet by mouth daily.     prochlorperazine 10 MG tablet  Commonly known as:  COMPAZINE  Take 1 tablet (10 mg total) by mouth every 6 (six) hours as needed for nausea or vomiting.     senna-docusate 8.6-50 MG tablet  Commonly known as:  Senokot-S  Take 2 tablets by mouth at bedtime.         PHYSICAL EXAMINATION  Oncology Vitals 05/11/2015 05/11/2015  Height - 168 cm  Weight 70.761 kg 70.988 kg  Weight (lbs) 156 lbs 156 lbs 8 oz  BMI (kg/m2) - 25.26 kg/m2  Temp - 98.2  Pulse - 85  Resp - 18  SpO2 - 100  BSA (m2) - 1.82 m2   BP Readings from Last 2 Encounters:  05/11/15 132/66  05/11/15 116/66    Physical Exam  Constitutional: She is oriented to person, place, and time and well-developed, well-nourished, and in no distress.  HENT:  Head: Normocephalic and atraumatic.  Mouth/Throat: Oropharynx is clear and moist.  Eyes: Conjunctivae and EOM are normal. Pupils are equal, round, and reactive to light. Right eye exhibits no discharge. Left eye exhibits no discharge. No scleral icterus.  Neck: Normal range of motion. Neck supple. No JVD present. No tracheal deviation present. No thyromegaly present.  Cardiovascular: Normal rate, regular rhythm, normal heart sounds and intact distal pulses.   Pulmonary/Chest: Effort normal and breath sounds normal. No respiratory distress. She has no wheezes. She has no rales. She exhibits no tenderness.  Abdominal: Soft. Bowel sounds are normal. She exhibits no distension and no mass. There is no tenderness. There is no rebound and no guarding.  Musculoskeletal: Normal range of motion. She exhibits no edema or tenderness.  Lymphadenopathy:    She has no cervical adenopathy.  Neurological: She is alert and oriented to person, place, and time. Gait normal.  Skin: Skin is warm and dry. No rash noted. No erythema. No pallor.  Psychiatric: Affect normal.  Nursing note and  vitals reviewed.   LABORATORY DATA:. Appointment on 05/11/2015  Component Date Value Ref Range Status  . WBC 05/11/2015 5.2  3.9 - 10.3 10e3/uL Final  . NEUT# 05/11/2015 4.5  1.5 - 6.5 10e3/uL Final  . HGB 05/11/2015 11.5* 11.6 - 15.9 g/dL Final  . HCT 05/11/2015 33.5* 34.8 - 46.6 % Final  . Platelets 05/11/2015 309  145 - 400 10e3/uL Final  . MCV 05/11/2015 105.3* 79.5 - 101.0 fL Final  . MCH 05/11/2015 36.1* 25.1 - 34.0 pg Final  . MCHC 05/11/2015 34.3  31.5 - 36.0 g/dL Final  . RBC 05/11/2015 3.18* 3.70 - 5.45 10e6/uL Final  . RDW 05/11/2015 20.9* 11.2 - 14.5 % Final  . lymph# 05/11/2015 0.6* 0.9 - 3.3 10e3/uL Final  .  MONO# 05/11/2015 0.1  0.1 - 0.9 10e3/uL Final  . Eosinophils Absolute 05/11/2015 0.0  0.0 - 0.5 10e3/uL Final  . Basophils Absolute 05/11/2015 0.0  0.0 - 0.1 10e3/uL Final  . NEUT% 05/11/2015 86.7* 38.4 - 76.8 % Final  . LYMPH% 05/11/2015 11.2* 14.0 - 49.7 % Final  . MONO% 05/11/2015 1.7  0.0 - 14.0 % Final  . EOS% 05/11/2015 0.0  0.0 - 7.0 % Final  . BASO% 05/11/2015 0.4  0.0 - 2.0 % Final  . Sodium 05/11/2015 138  136 - 145 mEq/L Final  . Potassium 05/11/2015 3.9  3.5 - 5.1 mEq/L Final  . Chloride 05/11/2015 108  98 - 109 mEq/L Final  . CO2 05/11/2015 21* 22 - 29 mEq/L Final  . Glucose 05/11/2015 207* 70 - 140 mg/dl Final   Glucose reference range is for nonfasting patients. Fasting glucose reference range is 70- 100.  [name] BUN 05/11/2015 7.7  7.0 - 26.0 mg/dL Final  . Creatinine 05/11/2015 1.0  0.6 - 1.1 mg/dL Final  . Total Bilirubin 05/11/2015 <0.30  0.20 - 1.20 mg/dL Final  . Alkaline Phosphatase 05/11/2015 81  40 - 150 U/L Final  . AST 05/11/2015 28  5 - 34 U/L Final  . ALT 05/11/2015 34  0 - 55 U/L Final  . Total Protein 05/11/2015 7.7  6.4 - 8.3 g/dL Final  . Albumin 05/11/2015 3.9  3.5 - 5.0 g/dL Final  . Calcium 05/11/2015 9.9  8.4 - 10.4 mg/dL Final  . Anion Gap 05/11/2015 9  3 - 11 mEq/L Final  . EGFR 05/11/2015 78* >90 ml/min/1.73 m2 Final   eGFR  is calculated using the CKD-EPI Creatinine Equation (2009)  . Technologist Review 05/11/2015 occassional meta & myelo. 1% nrbc.   Final     RADIOGRAPHIC STUDIES: No results found.  ASSESSMENT/PLAN:    Cancer associated pain Patient continues to complain of left iliac bone pain.  She requested refill of her Percocet 7.5/325 pain medication today.  Refill was given.  Also, patient continues with some chronic mid to lower back pain as well.  She states that she's been having difficulties communicating with her neurosurgeon, Dr. Sherley Bounds office.  Advised patient would have [place] call/communicate with neurosurgeon's office for follow-up.  Nausea without vomiting Patient states that she did experience some mild, intermittent nausea following her last cycle of chemotherapy.  She denies any vomiting.  She confirmed that she does already have anti-nausea medication at home.  Non-small cell carcinoma of right lung, stage 4 ([place]) Patient received cycle 3 of her carboplatin/Alimta chemotherapy regimen on 04/05/2015.  She presents to the [facility] today to receive cycle 4 of the same regimen.  Patient states that she did fairly well with her last cycle of chemotherapy.  She complains of some mild nausea only.  She is requesting a refill of her pain medicine today.  Patient is requesting assistance communicating with her neurosurgeon regarding her chronic back pain.  Also, patient states that she saw Dr. Enrique Sack dental oncologist in regards to [facility] clearance.  Patient states that she was informed that she needed to upper back teeth extracted prior to her undergoing Xgeva therapy.  Patient is scheduled to return for labs on both 05/18/2015 and 05/25/2015.  She is scheduled to return for labs, visit, and cycle 4 of her chemotherapy on 05/31/2015.  Patient stated understanding of all instructions; and was in agreement with this plan of care. The patient knows to call the clinic  with any  problems, questions or concerns.   This was a shared visit with Dr. Julien Nordmann today.  Total time spent with patient was 25 minutes;  with greater than 85 percent of that time spent in face to face counseling regarding patient's symptoms,  and coordination of care and follow up.  Disclaimer:This dictation was prepared with Dragon/digital dictation along with [employer]. Any transcriptional errors that result from this process are unintentional.  Drue Second, NP 05/11/2015   ADDENDUM: Hematology/Oncology Attending: I had a face to face encounter with the patient today. I recommended her care plan. This is a very pleasant 45 years old African-American female with a stage IV non-small cell lung cancer status post palliative radiation to the pelvic bone lesion and currently undergoing systemic chemotherapy with carboplatin and Alimta status post 3 cycles. The patient has been tolerating her treatment fairly well with no significant adverse effects. Repeat CT scan of the chest, abdomen and pelvis showed improvement of her disease in the lung and mediastinal lymph nodes. I discussed the scan results and showed the images to the patient today. I recommended for her to continue systemic chemotherapy with carboplatin and Alimta as scheduled. She will proceed with cycle #4 today. For the metastatic bone disease, the patient is currently undergoing dental evaluation before starting her on treatment with Xgeva. She will come back for follow-up visit in 3 weeks for evaluation before starting cycle #5. The patient was advised to call immediately if she has any concerning symptoms in the interval.  Disclaimer: This note was dictated with voice recognition software. Similar sounding words can inadvertently be transcribed and may be missed upon review. Eilleen Kempf., MD 05/11/2015

## 2015-05-11 NOTE — Patient Instructions (Addendum)
The patient is to follow-up with the oral surgeon for extraction of tooth numbers 3 and 15 with alveoloplasty. This treatment will be in coordination with Dr. Julien Nordmann and the current chemotherapy regimen. Patient also agrees to be referred to [facility] [place]. [place] for periodontal and general dental care needs.  Dr. Enrique Sack

## 2015-05-11 NOTE — Progress Notes (Signed)
DENTAL CONSULTATION  Date of Consultation:  05/11/2015 Patient Name:   Andrea [name] Date of Birth:   09-Jan-1971 Medical Record Number: [id number]  VITALS: BP 116/66 mmHg  Pulse 70  Temp(Src) 98.3 F (36.8 C) (Oral)  CHIEF COMPLAINT: Patient referred by Dr. Curt Bears for dental consultation.  HPI: Andrea [name] is a 45 year old female recently diagnosed with lung cancer with bone metastases. Patient is undergoing active chemotherapy with carboplatin and Alimta. Patient is status post radiation therapy from 12/29/2014 through 1929 2016 to the left iliac bone. Patient is now seen as part of a medically necessary pre-Xgeva therapy dental protocol examination.  The patient currently is complaining of history of tooth pain involving the upper right and upper left molars (3 and 15) for the past 6 months. Patient describes a sharp and dull pain occurring in an intermittent fashion.  Patient indicates that tooth #3 reached an intensity of 7 out of 10 yesterday when she was chewing peanut M&Ms. This pain was relieved with her Percocet pain medication. Patient currently has 0 out of 10 intensity pain today.   Patient was last seen by [facility] in June or July 2016 for an exam and cleaning and resin restorations involving tooth numbers 3 and 15. Patient denies having any partial dentures. Patient indicates that she is no longer able to go to [facility] since she has Medicaid.  PROBLEM LIST: Patient Active Problem List   Diagnosis Date Noted  . Non-small cell carcinoma of right lung, stage 4 ([place]) 01/01/2015    Priority: Medium  . Malnutrition ([place]) 01/25/2015  . Bone metastases ([place]) 12/30/2014  . Uncontrolled pain 12/29/2014  . Mass of left hip region 12/29/2014  . Hypokalemia 12/29/2014  . Bone mass   . Abnormal MRI 11/30/2014  . Lumbar disc prolapse with compression radiculopathy 08/18/2014  . S/P lumbar laminectomy 05/01/2014  . Tachycardia  12/04/2013  . Annular disc tear 07/09/2013  . Depression 05/14/2013  . Depo-Provera contraceptive status 01/15/2012  . Tobacco abuse 10/11/2011  . Degenerative disc disease, lumbar 11/29/2010  . Dysmenorrhea 12/06/2009    PMH: Past Medical History  Diagnosis Date  . Low back pain radiating to left leg 11/29/2010  . Dysmenorrhea 12/06/2009    Qualifier: Diagnosis of  By: [name] RN, Regino Schultze    . Chronic back pain   . Family history of adverse reaction to anesthesia     mother stopped breathing after surgery  . Anxiety   . Depression   . Headache     stress  . S/P radiation therapy 01/01/2015 through 01/21/2015      Left iliac bone 3500 cGy in 14 sessions   . Shortness of breath dyspnea     with exertion, Pt denies 02/2015  . Anginal pain ([place])     chest pain not sure what related to  . Non-small cell carcinoma of right lung, stage 4 ([place]) 01/01/2015    PSH: Past Surgical History  Procedure Laterality Date  . Laproscopy surgery to check her tubes    . Lumbar laminectomy/decompression microdiscectomy Left 05/01/2014    Procedure: Microdiscectomy - L5-S1 - left;  Surgeon: Eustace Moore, MD;  Location: [facility];  Service: Neurosurgery;  Laterality: Left;    ALLERGIES: Allergies  Allergen Reactions  . Morphine And Related Rash    Patient states that if she takes more than 30 days she gets rashes.  . Hydrocodone Rash    MEDICATIONS: Current Outpatient Prescriptions  Medication  Sig Dispense Refill  . cyanocobalamin (,VITAMIN B-12,) 1000 MCG/ML injection Inject 1,000 mcg into the muscle once.    [name] dexamethasone (DECADRON) 4 MG tablet 4 mg po bid, the day before, day of and day after chemo 40 tablet 1  . feeding supplement, ENSURE ENLIVE, (ENSURE ENLIVE) LIQD Take 237 mLs by mouth 3 (three) times daily between meals. (Patient not taking: Reported on 05/11/2015) 423 mL 12  . folic acid (FOLVITE) 1 MG tablet Take 1  tablet (1 mg total) by mouth daily. 30 tablet 4  . lidocaine-prilocaine (EMLA) cream Apply 1 teaspoon to skin 1 to 2 hours before use cover to keep in contact with skin 30 g PRN  . Multiple Vitamins-Minerals (MULTIVITAMIN & MINERAL PO) Take 2 tablets by mouth daily.     . Prenatal Vit-Fe Fumarate-FA (PRENATAL VITAMIN PO) Take 1 tablet by mouth daily.    . prochlorperazine (COMPAZINE) 10 MG tablet Take 1 tablet (10 mg total) by mouth every 6 (six) hours as needed for nausea or vomiting. (Patient not taking: Reported on 05/11/2015) 60 tablet 0  . senna-docusate (SENOKOT-S) 8.6-50 MG per tablet Take 2 tablets by mouth at bedtime. 30 tablet 0  . oxyCODONE-acetaminophen (PERCOCET) 7.5-325 MG tablet Take 1 tablet by mouth every 6 (six) hours as needed for severe pain. 60 tablet 0  . polyethylene glycol (MIRALAX / GLYCOLAX) packet Take 17 g by mouth daily as needed for mild constipation. (Patient not taking: Reported on 04/07/2015) 14 each 0   No current facility-administered medications for this visit.   Facility-Administered Medications Ordered in Other Visits  Medication Dose Route Frequency Provider Last Rate Last Dose  . 0.9 %  sodium chloride infusion   Intravenous Once Curt Bears, MD      . CARBOplatin (PARAPLATIN) 440 mg in sodium chloride 0.9 % 250 mL chemo infusion  440 mg Intravenous Once Curt Bears, MD      . heparin lock flush 100 unit/mL  500 Units Intracatheter Once PRN Curt Bears, MD      . ondansetron (ZOFRAN) 16 mg, dexamethasone (DECADRON) 20 mg in sodium chloride 0.9 % 50 mL IVPB   Intravenous Once Curt Bears, MD      . PEMEtrexed (ALIMTA) 650 mg in sodium chloride 0.9 % 100 mL chemo infusion  400 mg/m2 (Treatment Plan Actual) Intravenous Once Curt Bears, MD      . sodium chloride 0.9 % injection 10 mL  10 mL Intracatheter PRN Curt Bears, MD        LABS: Lab Results  Component Value Date   WBC 2.5* 04/27/2015   HGB 10.7* 04/27/2015   HCT 31.5*  04/27/2015   MCV 101.9* 04/27/2015   PLT 173 04/27/2015      Component Value Date/Time   NA 140 04/27/2015 1003   NA 141 03/08/2015 1100   K 3.7 04/27/2015 1003   K 4.2 03/08/2015 1100   CL 111 03/08/2015 1100   CO2 23 04/27/2015 1003   CO2 22 03/08/2015 1100   GLUCOSE 121 04/27/2015 1003   GLUCOSE 92 03/08/2015 1100   BUN 6.7* 04/27/2015 1003   BUN 8 03/08/2015 1100   CREATININE 0.9 04/27/2015 1003   CREATININE 0.77 03/08/2015 1100   CREATININE 0.85 01/08/2014 1637   CALCIUM 9.0 04/27/2015 1003   CALCIUM 9.5 03/08/2015 1100   GFRNONAA >60 03/08/2015 1100   GFRNONAA 85 01/08/2014 1637   GFRAA >60 03/08/2015 1100   GFRAA >89 01/08/2014 1637   Lab Results  Component Value  Date   INR 0.89 03/08/2015   INR 1.12 12/21/2014   No results found for: PTT  SOCIAL HISTORY: Social History   Social History  . Marital Status: Single    Spouse Name: N/A  . Number of Children: N/A  . Years of Education: N/A   Occupational History  . Not on file.   Social History Main Topics  . Smoking status: Former Smoker -- 0.50 packs/day for 25 years    Types: Cigarettes, E-cigarettes    Quit date: 12/29/2014  . Smokeless tobacco: Never Used     Comment: Smoking very little.  . Alcohol Use: No  . Drug Use: No     Comment: previoulsly.  [name] Sexual Activity: Not Currently   Other Topics Concern  . Not on file   Social History Narrative    FAMILY HISTORY: Family History  Problem Relation Age of Onset  . Diabetes Mother   . Hypertension Mother   . Hypertension Father   . Heart attack Maternal Grandfather   . Hypertension Maternal Grandfather   . Heart attack Paternal Grandfather   . Hypertension Paternal Grandfather     REVIEW OF SYSTEMS: Reviewed ROS from Dr. Worthy Flank evaluation of 04/05/2015 with positives as noted below. Constitutional: positive for fatigue Eyes: negative Ears, nose, throat, and face: negative; + for toothache symptoms of upper molars Respiratory:  negative, + for intermittent shortness of breath Cardiovascular: negative, + chest pain. Gastrointestinal: negative Genitourinary:negative Integument/breast: negative Hematologic/lymphatic: negative Musculoskeletal:positive for back pain and bone pain Neurological: negative Behavioral/Psych: negative Endocrine: negative for diabetes or thyroid disease Allergic/Immunologic: negative   DENTAL HISTORY: CHIEF COMPLAINT: Patient referred by Dr. Curt Bears for dental consultation.  HPI: Andrea [name] is a 45 year old female recently diagnosed with lung cancer with bone metastases. Patient is undergoing active chemotherapy with carboplatin and Alimta. Patient is status post radiation therapy from 12/29/2014 through 1929 2016 to the left iliac bone. Patient is now seen as part of a medically necessary pre-Xgeva therapy dental protocol examination.  The patient currently is complaining of history of tooth pain involving the upper right and upper left molars (3 and 15) for the past 6 months. Patient describes a sharp and dull pain occurring in an intermittent fashion.  Patient indicates that tooth #3 reached an intensity of 7 out of 10 yesterday when she was chewing peanut M&Ms. This pain was relieved with her Percocet pain medication. Patient currently has 0 out of 10 intensity pain today.   Patient was last seen by [facility] in June or July 2016 for an exam and cleaning and resin restorations involving tooth numbers 3 and 15. Patient denies having any partial dentures. Patient indicates that she is no longer able to go to [facility] since she has Medicaid.  DENTAL EXAMINATION: GENERAL: The patient is a well-developed, well-nourished female in no acute distress. HEAD AND NECK: There is no palpable submandibular lymphadenopathy. The patient denies acute TMJ symptoms. The patient has a maximum interincisal opening of 40 mm. INTRAORAL EXAM: Patient has  normal saliva. Patient is a small mandibular right lingual torus. The patient has a deep palatal vault. There is no evidence of oral abscess formation. DENTITION: Patient is missing tooth numbers 1, 3, 12, 14, 16, 17 -19, and 30-32. Multiple rotated teeth are noted. Multiple diastemas are noted. PERIODONTAL: Patient has chronic periodontitis with minimal plaque accumulations, selective areas of gingival recession and no significant tooth mobility. Periodontal charting was deferred secondary to questionable need  for antibiotic premedication due to the presence of the Port-A-Cath. DENTAL CARIES/SUBOPTIMAL RESTORATIONS: No dental caries noted. ENDODONTIC: Patient with a history of questionable acute pulpitis symptoms involving tooth numbers 3 and 15. No obvious periapical pathology or radiolucency is noted.  CROWN AND BRIDGE: No crown or bridge restorations are noted. PROSTHODONTIC: Patient denies having partial dentures. OCCLUSION: Patient has a poor occlusal scheme secondary to multiple missing teeth, multiple rotated teeth, multiple diastemas, deep overbite, supra-eruption and drifting of the unopposed teeth into the edentulous areas, and lack of replacement of missing teeth with dental prostheses.  RADIOGRAPHIC INTERPRETATION: An orthopantogram was taken and supplemented with a full series of dental radiographs. There are multiple missing teeth. There are multiple diastemas noted. There is supra-eruption and drifting of the unopposed teeth into the edentulous areas. There is incipient to moderate bone loss noted. There are no obvious periapical radiolucencies. Bilateral maxillary sinuses are noted and appear to be clear and well aerated.  ASSESSMENTS: 1. Lung cancer with metastases to bone 2. Active chemotherapy 3. Pre-Xgeva dental protocol examination 4. History of acute pulpitis involving upper molars 5. Chronic periodontitis 6. Selective areas of gingival recession 7. No significant tooth  mobility 8. Accretions-minimal 9. Multiple missing teeth 10. Multiple rotated teeth 11. Multiple diastemas 12. Deep overbite 13. Significant supra-eruption of tooth numbers 3 and 15 nearly touching the mandibular alveolar ridges. 14. Malocclusion 15. Small mandibular right lingual torus 16. Risk for bleeding with invasive dental procedures with current chemotherapy and history of thrombocytopenia  PLAN/RECOMMENDATIONS: 1. I discussed the risks, benefits, and complications of various treatment options with the patient in relationship to her medical and dental conditions, current chemotherapy, and anticipated use of Xgeva therapy and risk for osteonecrosis of the jaw with invasive dental procedures. We discussed various treatment options to include no treatment, multiple extractions with alveoloplasty, pre-prosthetic surgery as indicated, periodontal therapy, dental restorations, root canal therapy, crown and bridge therapy, implant therapy, and replacement of missing teeth as indicated. The patient currently wishes to proceed with referral to an oral surgeon for extraction of tooth numbers 3 and 15 with alveoloplasty as needed.  Patient also agrees to follow-up with [facility] for initial periodontal therapy and maintenance procedures and evaluation for replacement of missing teeth as indicated. Patient is aware that these appointments will need to be coordinated with Dr. Curt Bears and the current chemotherapy regimen as indicated.  Xgeva therapy is to be withheld until dental extraction procedures have been performed and adequate healing from the extractions has been completed.  2. Discussion of findings with medical team and coordination of future medical and dental care as needed.  I spent in excess of  90 minutes during the conduct of this consultation and >50% of this time involved direct face-to-face encounter for counseling and/or coordination of the patient's  care.    Lenn Cal, DDS

## 2015-05-11 NOTE — Telephone Encounter (Signed)
LM for Dr. Sherley Bounds scheduler- Alyssa [phone]) to call pt for an appt with Dr. Ronnald Ramp to discuss pain/back concerns she has been experiencing. Left this office number to contact for any further questions.

## 2015-05-11 NOTE — Assessment & Plan Note (Signed)
Patient received cycle 3 of her carboplatin/Alimta chemotherapy regimen on 04/05/2015.  She presents to the [facility] today to receive cycle 4 of the same regimen.  Patient states that she did fairly well with her last cycle of chemotherapy.  She complains of some mild nausea only.  She is requesting a refill of her pain medicine today.  Patient is requesting assistance communicating with her neurosurgeon regarding her chronic back pain.  Also, patient states that she saw Dr. Enrique Sack dental oncologist in regards to [facility] clearance.  Patient states that she was informed that she needed to upper back teeth extracted prior to her undergoing Xgeva therapy.  Patient is scheduled to return for labs on both 05/18/2015 and 05/25/2015.  She is scheduled to return for labs, visit, and cycle 4 of her chemotherapy on 05/31/2015.

## 2015-05-17 ENCOUNTER — Ambulatory Visit: Payer: No Typology Code available for payment source

## 2015-05-17 ENCOUNTER — Encounter: Payer: Medicaid Other | Admitting: Nutrition

## 2015-05-17 ENCOUNTER — Ambulatory Visit: Payer: No Typology Code available for payment source | Admitting: Internal Medicine

## 2015-05-17 ENCOUNTER — Other Ambulatory Visit: Payer: No Typology Code available for payment source

## 2015-05-18 ENCOUNTER — Ambulatory Visit (HOSPITAL_BASED_OUTPATIENT_CLINIC_OR_DEPARTMENT_OTHER): Payer: Medicaid Other

## 2015-05-18 ENCOUNTER — Other Ambulatory Visit: Payer: Self-pay | Admitting: Medical Oncology

## 2015-05-18 ENCOUNTER — Other Ambulatory Visit (HOSPITAL_BASED_OUTPATIENT_CLINIC_OR_DEPARTMENT_OTHER): Payer: Medicaid Other

## 2015-05-18 VITALS — BP 120/59 | HR 73 | Temp 98.0°F

## 2015-05-18 DIAGNOSIS — C3491 Malignant neoplasm of unspecified part of right bronchus or lung: Secondary | ICD-10-CM

## 2015-05-18 DIAGNOSIS — Z95828 Presence of other vascular implants and grafts: Secondary | ICD-10-CM

## 2015-05-18 DIAGNOSIS — Z452 Encounter for adjustment and management of vascular access device: Secondary | ICD-10-CM

## 2015-05-18 LAB — CBC WITH DIFFERENTIAL/PLATELET
BASO%: 0.5 % (ref 0.0–2.0)
Basophils Absolute: 0 10*3/uL (ref 0.0–0.1)
EOS%: 2.6 % (ref 0.0–7.0)
Eosinophils Absolute: 0.1 10*3/uL (ref 0.0–0.5)
HCT: 33.6 % — ABNORMAL LOW (ref 34.8–46.6)
HEMOGLOBIN: 11.6 g/dL (ref 11.6–15.9)
LYMPH%: 45.4 % (ref 14.0–49.7)
MCH: 35.8 pg — AB (ref 25.1–34.0)
MCHC: 34.5 g/dL (ref 31.5–36.0)
MCV: 103.7 fL — ABNORMAL HIGH (ref 79.5–101.0)
MONO#: 0.2 10*3/uL (ref 0.1–0.9)
MONO%: 7.7 % (ref 0.0–14.0)
NEUT%: 43.8 % (ref 38.4–76.8)
NEUTROS ABS: 0.9 10*3/uL — AB (ref 1.5–6.5)
Platelets: 242 10*3/uL (ref 145–400)
RBC: 3.24 10*6/uL — ABNORMAL LOW (ref 3.70–5.45)
RDW: 16.9 % — AB (ref 11.2–14.5)
WBC: 2 10*3/uL — AB (ref 3.9–10.3)
lymph#: 0.9 10*3/uL (ref 0.9–3.3)

## 2015-05-18 LAB — COMPREHENSIVE METABOLIC PANEL
ALBUMIN: 4 g/dL (ref 3.5–5.0)
ALK PHOS: 74 U/L (ref 40–150)
ALT: 28 U/L (ref 0–55)
AST: 24 U/L (ref 5–34)
Anion Gap: 8 mEq/L (ref 3–11)
BUN: 9.1 mg/dL (ref 7.0–26.0)
CO2: 23 mEq/L (ref 22–29)
Calcium: 9.4 mg/dL (ref 8.4–10.4)
Chloride: 107 mEq/L (ref 98–109)
Creatinine: 0.9 mg/dL (ref 0.6–1.1)
EGFR: 87 mL/min/{1.73_m2} — ABNORMAL LOW (ref >90)
GLUCOSE: 112 mg/dL (ref 70–140)
POTASSIUM: 3.5 meq/L (ref 3.5–5.1)
SODIUM: 138 meq/L (ref 136–145)
TOTAL PROTEIN: 7.6 g/dL (ref 6.4–8.3)
Total Bilirubin: 0.58 mg/dL (ref 0.20–1.20)

## 2015-05-18 MED ORDER — HEPARIN SOD (PORK) LOCK FLUSH 100 UNIT/ML IV SOLN
500.0000 [IU] | Freq: Once | INTRAVENOUS | Status: AC
  Administered 2015-05-18: 500 [IU] via INTRAVENOUS
  Filled 2015-05-18: qty 5

## 2015-05-18 MED ORDER — SODIUM CHLORIDE 0.9% FLUSH
10.0000 mL | INTRAVENOUS | Status: DC | PRN
  Administered 2015-05-18: 10 mL via INTRAVENOUS
  Filled 2015-05-18: qty 10

## 2015-05-24 ENCOUNTER — Other Ambulatory Visit: Payer: Self-pay | Admitting: Medical Oncology

## 2015-05-24 ENCOUNTER — Ambulatory Visit: Payer: Self-pay | Admitting: Internal Medicine

## 2015-05-24 ENCOUNTER — Other Ambulatory Visit: Payer: Medicaid Other

## 2015-05-24 ENCOUNTER — Encounter: Payer: Self-pay | Admitting: Nutrition

## 2015-05-24 ENCOUNTER — Ambulatory Visit: Payer: Self-pay

## 2015-05-24 DIAGNOSIS — C7951 Secondary malignant neoplasm of bone: Secondary | ICD-10-CM

## 2015-05-24 DIAGNOSIS — C3491 Malignant neoplasm of unspecified part of right bronchus or lung: Secondary | ICD-10-CM

## 2015-05-25 ENCOUNTER — Other Ambulatory Visit (HOSPITAL_BASED_OUTPATIENT_CLINIC_OR_DEPARTMENT_OTHER): Payer: Medicaid Other

## 2015-05-25 ENCOUNTER — Ambulatory Visit (HOSPITAL_BASED_OUTPATIENT_CLINIC_OR_DEPARTMENT_OTHER): Payer: Medicaid Other

## 2015-05-25 DIAGNOSIS — C7951 Secondary malignant neoplasm of bone: Secondary | ICD-10-CM

## 2015-05-25 DIAGNOSIS — Z95828 Presence of other vascular implants and grafts: Secondary | ICD-10-CM

## 2015-05-25 DIAGNOSIS — C3491 Malignant neoplasm of unspecified part of right bronchus or lung: Secondary | ICD-10-CM

## 2015-05-25 LAB — CBC WITH DIFFERENTIAL/PLATELET
BASO%: 0.8 % (ref 0.0–2.0)
Basophils Absolute: 0 10*3/uL (ref 0.0–0.1)
EOS%: 1.3 % (ref 0.0–7.0)
Eosinophils Absolute: 0 10*3/uL (ref 0.0–0.5)
HCT: 34.1 % — ABNORMAL LOW (ref 34.8–46.6)
HGB: 11.3 g/dL — ABNORMAL LOW (ref 11.6–15.9)
LYMPH%: 35.9 % (ref 14.0–49.7)
MCH: 35.2 pg — ABNORMAL HIGH (ref 25.1–34.0)
MCHC: 33.1 g/dL (ref 31.5–36.0)
MCV: 106.4 fL — ABNORMAL HIGH (ref 79.5–101.0)
MONO#: 0.5 10*3/uL (ref 0.1–0.9)
MONO%: 19.1 % — AB (ref 0.0–14.0)
NEUT#: 1.2 10*3/uL — ABNORMAL LOW (ref 1.5–6.5)
NEUT%: 42.9 % (ref 38.4–76.8)
PLATELETS: 220 10*3/uL (ref 145–400)
RBC: 3.21 10*6/uL — AB (ref 3.70–5.45)
RDW: 18.6 % — ABNORMAL HIGH (ref 11.2–14.5)
WBC: 2.8 10*3/uL — ABNORMAL LOW (ref 3.9–10.3)
lymph#: 1 10*3/uL (ref 0.9–3.3)

## 2015-05-25 LAB — COMPREHENSIVE METABOLIC PANEL
ALK PHOS: 74 U/L (ref 40–150)
ALT: 30 U/L (ref 0–55)
ANION GAP: 9 meq/L (ref 3–11)
AST: 25 U/L (ref 5–34)
Albumin: 3.7 g/dL (ref 3.5–5.0)
BUN: 8.6 mg/dL (ref 7.0–26.0)
CO2: 22 mEq/L (ref 22–29)
Calcium: 9.5 mg/dL (ref 8.4–10.4)
Chloride: 109 mEq/L (ref 98–109)
Creatinine: 0.9 mg/dL (ref 0.6–1.1)
EGFR: 89 mL/min/{1.73_m2} — AB (ref >90)
Glucose: 108 mg/dl (ref 70–140)
Potassium: 4.1 mEq/L (ref 3.5–5.1)
SODIUM: 139 meq/L (ref 136–145)
Total Protein: 7.1 g/dL (ref 6.4–8.3)

## 2015-05-25 MED ORDER — HEPARIN SOD (PORK) LOCK FLUSH 100 UNIT/ML IV SOLN
500.0000 [IU] | Freq: Once | INTRAVENOUS | Status: AC
  Administered 2015-05-25: 500 [IU] via INTRAVENOUS
  Filled 2015-05-25: qty 5

## 2015-05-25 MED ORDER — SODIUM CHLORIDE 0.9% FLUSH
10.0000 mL | INTRAVENOUS | Status: DC | PRN
  Administered 2015-05-25: 10 mL via INTRAVENOUS
  Filled 2015-05-25: qty 10

## 2015-05-25 NOTE — Patient Instructions (Signed)

## 2015-05-31 ENCOUNTER — Encounter: Payer: Self-pay | Admitting: *Deleted

## 2015-05-31 ENCOUNTER — Telehealth: Payer: Self-pay | Admitting: Internal Medicine

## 2015-05-31 ENCOUNTER — Ambulatory Visit: Payer: Medicaid Other | Admitting: Nutrition

## 2015-05-31 ENCOUNTER — Encounter: Payer: Self-pay | Admitting: Internal Medicine

## 2015-05-31 ENCOUNTER — Ambulatory Visit (HOSPITAL_BASED_OUTPATIENT_CLINIC_OR_DEPARTMENT_OTHER): Payer: Medicaid Other

## 2015-05-31 ENCOUNTER — Other Ambulatory Visit (HOSPITAL_COMMUNITY)
Admission: RE | Admit: 2015-05-31 | Discharge: 2015-05-31 | Disposition: A | Discharge status: Home / Self Care | Payer: Medicaid Other | Source: Ambulatory Visit | Attending: Internal Medicine | Admitting: Internal Medicine

## 2015-05-31 ENCOUNTER — Ambulatory Visit: Payer: Medicaid Other

## 2015-05-31 ENCOUNTER — Other Ambulatory Visit (HOSPITAL_BASED_OUTPATIENT_CLINIC_OR_DEPARTMENT_OTHER): Payer: Medicaid Other

## 2015-05-31 ENCOUNTER — Other Ambulatory Visit: Payer: Self-pay | Admitting: *Deleted

## 2015-05-31 ENCOUNTER — Ambulatory Visit (HOSPITAL_BASED_OUTPATIENT_CLINIC_OR_DEPARTMENT_OTHER): Payer: Medicaid Other | Admitting: Internal Medicine

## 2015-05-31 VITALS — BP 122/71 | HR 87 | Temp 97.8°F | Resp 18 | Ht 66.0 in | Wt 160.9 lb

## 2015-05-31 VITALS — BP 122/71 | HR 87 | Temp 97.8°F | Resp 18

## 2015-05-31 DIAGNOSIS — C3491 Malignant neoplasm of unspecified part of right bronchus or lung: Secondary | ICD-10-CM

## 2015-05-31 DIAGNOSIS — Z3200 Encounter for pregnancy test, result unknown: Secondary | ICD-10-CM | POA: Insufficient documentation

## 2015-05-31 DIAGNOSIS — Z5111 Encounter for antineoplastic chemotherapy: Secondary | ICD-10-CM

## 2015-05-31 DIAGNOSIS — G893 Neoplasm related pain (acute) (chronic): Secondary | ICD-10-CM | POA: Diagnosis not present

## 2015-05-31 DIAGNOSIS — C7951 Secondary malignant neoplasm of bone: Secondary | ICD-10-CM

## 2015-05-31 LAB — COMPREHENSIVE METABOLIC PANEL
ALBUMIN: 3.9 g/dL (ref 3.5–5.0)
ALK PHOS: 72 U/L (ref 40–150)
ALT: 31 U/L (ref 0–55)
AST: 22 U/L (ref 5–34)
Anion Gap: 11 mEq/L (ref 3–11)
BUN: 8.2 mg/dL (ref 7.0–26.0)
CALCIUM: 9.7 mg/dL (ref 8.4–10.4)
CO2: 19 mEq/L — ABNORMAL LOW (ref 22–29)
CREATININE: 1 mg/dL (ref 0.6–1.1)
Chloride: 109 mEq/L (ref 98–109)
EGFR: 85 mL/min/{1.73_m2} — ABNORMAL LOW (ref >90)
Glucose: 190 mg/dl — ABNORMAL HIGH (ref 70–140)
Potassium: 4 mEq/L (ref 3.5–5.1)
Sodium: 139 mEq/L (ref 136–145)
TOTAL PROTEIN: 7.4 g/dL (ref 6.4–8.3)

## 2015-05-31 LAB — CBC WITH DIFFERENTIAL/PLATELET
BASO%: 0.5 % (ref 0.0–2.0)
Basophils Absolute: 0 10*3/uL (ref 0.0–0.1)
EOS%: 0 % (ref 0.0–7.0)
Eosinophils Absolute: 0 10*3/uL (ref 0.0–0.5)
HEMATOCRIT: 32.3 % — AB (ref 34.8–46.6)
HEMOGLOBIN: 10.8 g/dL — AB (ref 11.6–15.9)
LYMPH#: 0.4 10*3/uL — AB (ref 0.9–3.3)
LYMPH%: 12 % — ABNORMAL LOW (ref 14.0–49.7)
MCH: 36.2 pg — ABNORMAL HIGH (ref 25.1–34.0)
MCHC: 33.6 g/dL (ref 31.5–36.0)
MCV: 107.8 fL — ABNORMAL HIGH (ref 79.5–101.0)
MONO#: 0.2 10*3/uL (ref 0.1–0.9)
MONO%: 7 % (ref 0.0–14.0)
NEUT#: 2.8 10*3/uL (ref 1.5–6.5)
NEUT%: 80.5 % — ABNORMAL HIGH (ref 38.4–76.8)
Platelets: 208 10*3/uL (ref 145–400)
RBC: 2.99 10*6/uL — ABNORMAL LOW (ref 3.70–5.45)
RDW: 17.9 % — AB (ref 11.2–14.5)
WBC: 3.5 10*3/uL — ABNORMAL LOW (ref 3.9–10.3)

## 2015-05-31 LAB — PREGNANCY, URINE: PREG TEST UR: NEGATIVE

## 2015-05-31 MED ORDER — SODIUM CHLORIDE 0.9 % IJ SOLN
10.0000 mL | Freq: Once | INTRAMUSCULAR | Status: AC
  Administered 2015-05-31: 10 mL
  Filled 2015-05-31: qty 10

## 2015-05-31 MED ORDER — SODIUM CHLORIDE 0.9 % IV SOLN
400.0000 mg/m2 | Freq: Once | INTRAVENOUS | Status: AC
  Administered 2015-05-31: 725 mg via INTRAVENOUS
  Filled 2015-05-31: qty 25

## 2015-05-31 MED ORDER — SODIUM CHLORIDE 0.9 % IJ SOLN
10.0000 mL | INTRAMUSCULAR | Status: DC | PRN
  Administered 2015-05-31: 10 mL
  Filled 2015-05-31: qty 10

## 2015-05-31 MED ORDER — SODIUM CHLORIDE 0.9 % IV SOLN
Freq: Once | INTRAVENOUS | Status: AC
  Administered 2015-05-31: 13:00:00 via INTRAVENOUS
  Filled 2015-05-31: qty 8

## 2015-05-31 MED ORDER — SODIUM CHLORIDE 0.9 % IV SOLN
571.0000 mg | Freq: Once | INTRAVENOUS | Status: AC
  Administered 2015-05-31: 570 mg via INTRAVENOUS
  Filled 2015-05-31: qty 57

## 2015-05-31 MED ORDER — HEPARIN SOD (PORK) LOCK FLUSH 100 UNIT/ML IV SOLN
500.0000 [IU] | Freq: Once | INTRAVENOUS | Status: AC | PRN
  Administered 2015-05-31: 500 [IU]
  Filled 2015-05-31: qty 5

## 2015-05-31 MED ORDER — SODIUM CHLORIDE 0.9 % IV SOLN
Freq: Once | INTRAVENOUS | Status: AC
  Administered 2015-05-31: 13:00:00 via INTRAVENOUS

## 2015-05-31 NOTE — Telephone Encounter (Signed)
per pof to sch pt appt-pt sch already made gave copy of avs

## 2015-05-31 NOTE — Patient Instructions (Signed)
[facility] Discharge Instructions for Patients Receiving Chemotherapy  Today you received the following chemotherapy agents: Alimta and Carboplatin.  To help prevent nausea and vomiting after your treatment, we encourage you to take your nausea medication: Compazine 10 mg every 6 hours as needed.   If you develop nausea and vomiting that is not controlled by your nausea medication, call the clinic.   BELOW ARE SYMPTOMS THAT SHOULD BE REPORTED IMMEDIATELY:  *FEVER GREATER THAN 100.5 F  *CHILLS WITH OR WITHOUT FEVER  NAUSEA AND VOMITING THAT IS NOT CONTROLLED WITH YOUR NAUSEA MEDICATION  *UNUSUAL SHORTNESS OF BREATH  *UNUSUAL BRUISING OR BLEEDING  TENDERNESS IN MOUTH AND THROAT WITH OR WITHOUT PRESENCE OF ULCERS  *URINARY PROBLEMS  *BOWEL PROBLEMS  UNUSUAL RASH Items with * indicate a potential emergency and should be followed up as soon as possible.  Feel free to call the clinic you have any questions or concerns. The clinic phone number is [phone].  Please show the [facility] at check-in to the Emergency Department and triage nurse.

## 2015-05-31 NOTE — Progress Notes (Signed)
Dr. Julien Nordmann ordered for pt to check for pregnancy urine test for pt today. Pt was able to provide a urine sample and was sent to lab for processing. Pt labs and schedule given.

## 2015-05-31 NOTE — Progress Notes (Signed)
Oncology Nurse Navigator Documentation  Oncology Nurse Navigator Flowsheets 05/31/2015  Navigator Encounter Type Clinic/MDC/spoke with Andrea [name] today at clinic.  She is doing well without complaints today.  No barriers identified at this time.   Patient Visit Type Follow-up  Treatment Phase Treatment  Barriers/Navigation Needs No barriers at this time  Acuity Level 1  Time Spent with Patient 15

## 2015-05-31 NOTE — Progress Notes (Signed)
Brief nutrition follow-up completed with patient. Patient is excited that she is gaining weight.   New weight documented as 160.9 pounds February 6 increased from 125.3 pounds November 17. Patient denies nutrition impact symptoms/questions.  Nutrition diagnosis: Unintended weight loss resolved.  Encouraged patient to continue strategies for adequate calories and protein.  Patient has my contact information for questions or concerns.

## 2015-05-31 NOTE — Progress Notes (Signed)
[facility] [phone]   [phone]  OFFICE PROGRESS NOTE  [name], MD [place] [address]  DIAGNOSIS: Stage IV (T1a, N2, M1b) non-small cell lung cancer, adenocarcinoma with negative EGFR mutation diagnosed in August 2016 and presented with right hilar and subcarinal lymphadenopathy as well as large soft tissue mass in the left posterior iliac bone.  PRIOR THERAPY: Status post palliative radiotherapy to the soft tissue mass in the left iliac bone under the care of Dr. Valere Dross  CURRENT THERAPY: Systemic chemotherapy with carboplatin for AUC of 5 and Alimta 500 MG/M2 every 3 weeks. First dose expected on 02/22/2015. Status post 4 cycles. Starting from cycle #3 Alimta was reduced to 400 MG/M2 secondary to neutropenia after the first 2 cycles.  INTERVAL HISTORY: Andrea [name] 45 y.o. female returns to the clinic today for follow-up visit.  The patient is feeling fine today with no specific complaints except for persistent pain in the left iliac area and lower back. 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 She is currently on Percocet 7.5/325 mg by mouth every 6 hours as needed for pain. She tolerated the last cycles of her systemic chemotherapy with carboplatin and Alimta fairly well . She denied having any significant chest pain, shortness of breath, cough or hemoptysis. She has no significant weight loss or night sweats. She has no headache or visual changes. She is here today to start cycle #5.  MEDICAL HISTORY: Past Medical History  Diagnosis Date  . Low back pain radiating to left leg 11/29/2010  . Dysmenorrhea 12/06/2009    Qualifier: Diagnosis of  By: [name] RN, Regino Schultze    . Chronic back pain   . Family history of adverse reaction to anesthesia     mother stopped  breathing after surgery  . Anxiety   . Depression   . Headache     stress  . S/P radiation therapy 01/01/2015 through 01/21/2015      Left iliac bone 3500 cGy in 14 sessions   . Shortness of breath dyspnea     with exertion, Pt denies 02/2015  . Anginal pain ([place])     chest pain not sure what related to  . Non-small cell carcinoma of right lung, stage 4 ([place]) 01/01/2015    ALLERGIES:  is allergic to morphine and related and hydrocodone.  MEDICATIONS:  Current Outpatient Prescriptions  Medication Sig Dispense Refill  . cyanocobalamin (,VITAMIN B-12,) 1000 MCG/ML injection Inject 1,000 mcg into the muscle once.    [name] dexamethasone (DECADRON) 4 MG tablet 4 mg po bid, the day before, day of and day after chemo 40 tablet 1  . feeding supplement, ENSURE ENLIVE, (ENSURE ENLIVE) LIQD Take 237 mLs by mouth 3 (three) times daily between meals. (Patient not taking: Reported on 05/11/2015) 315 mL 12  . folic acid (FOLVITE) 1 MG tablet Take 1 tablet (1 mg total) by mouth daily. 30 tablet 4  . lidocaine-prilocaine (EMLA) cream Apply 1 teaspoon to skin 1 to 2 hours before use cover to keep in contact with skin 30 g PRN  . Multiple Vitamins-Minerals (MULTIVITAMIN & MINERAL PO) Take 2 tablets by mouth daily.     [name] oxyCODONE-acetaminophen (PERCOCET) 7.5-325 MG tablet Take 1 tablet by mouth every 6 (six) hours as needed  for severe pain. 60 tablet 0  . polyethylene glycol (MIRALAX / GLYCOLAX) packet Take 17 g by mouth daily as needed for mild constipation. (Patient not taking: Reported on 04/07/2015) 14 each 0  . Prenatal Vit-Fe Fumarate-FA (PRENATAL VITAMIN PO) Take 1 tablet by mouth daily.    . prochlorperazine (COMPAZINE) 10 MG tablet Take 1 tablet (10 mg total) by mouth every 6 (six) hours as needed for nausea or vomiting. (Patient not taking: Reported on 05/11/2015) 60 tablet 0  . senna-docusate (SENOKOT-S) 8.6-50 MG per tablet Take 2  tablets by mouth at bedtime. 30 tablet 0   No current facility-administered medications for this visit.    SURGICAL HISTORY:  Past Surgical History  Procedure Laterality Date  . Laproscopy surgery to check her tubes    . Lumbar laminectomy/decompression microdiscectomy Left 05/01/2014    Procedure: Microdiscectomy - L5-S1 - left;  Surgeon: Eustace Moore, MD;  Location: [facility];  Service: Neurosurgery;  Laterality: Left;    REVIEW OF SYSTEMS:  A comprehensive review of systems was negative except for: Musculoskeletal: positive for bone pain   PHYSICAL EXAMINATION: General appearance: alert, cooperative, fatigued and no distress Head: Normocephalic, without obvious abnormality, atraumatic Neck: no adenopathy, no JVD, supple, symmetrical, trachea midline and thyroid not enlarged, symmetric, no tenderness/mass/nodules Lymph nodes: Cervical, supraclavicular, and axillary nodes normal. Resp: clear to auscultation bilaterally Back: symmetric, no curvature. ROM normal. No CVA tenderness. Cardio: regular rate and rhythm, S1, S2 normal, no murmur, click, rub or gallop GI: soft, non-tender; bowel sounds normal; no masses,  no organomegaly Extremities: extremities normal, atraumatic, no cyanosis or edema Neurologic: Alert and oriented X 3, normal strength and tone. Normal symmetric reflexes. Normal coordination and gait  ECOG PERFORMANCE STATUS: 1 - Symptomatic but completely ambulatory  Blood pressure 122/71, pulse 87, temperature 97.8 F (36.6 C), temperature source Oral, resp. rate 18, height 5' 6"  (1.676 m), weight 160 lb 14.4 oz (72.984 kg), SpO2 99 %.  LABORATORY DATA: Lab Results  Component Value Date   WBC 3.5* 05/31/2015   HGB 10.8* 05/31/2015   HCT 32.3* 05/31/2015   MCV 107.8* 05/31/2015   PLT 208 05/31/2015      Chemistry      Component Value Date/Time   NA 139 05/25/2015 1142   NA 141 03/08/2015 1100   K 4.1 05/25/2015 1142   K 4.2 03/08/2015 1100   CL 111 03/08/2015 1100    CO2 22 05/25/2015 1142   CO2 22 03/08/2015 1100   BUN 8.6 05/25/2015 1142   BUN 8 03/08/2015 1100   CREATININE 0.9 05/25/2015 1142   CREATININE 0.77 03/08/2015 1100   CREATININE 0.85 01/08/2014 1637      Component Value Date/Time   CALCIUM 9.5 05/25/2015 1142   CALCIUM 9.5 03/08/2015 1100   ALKPHOS 74 05/25/2015 1142   ALKPHOS 74 04/20/2013 1320   AST 25 05/25/2015 1142   AST 17 04/20/2013 1320   ALT 30 05/25/2015 1142   ALT 14 04/20/2013 1320   BILITOT <0.30 05/25/2015 1142   BILITOT 0.5 04/20/2013 1320       RADIOGRAPHIC STUDIES: No results found.  ASSESSMENT AND PLAN: This is a very pleasant 45 years old African-American female recently diagnosed with metastatic non-small cell lung cancer, adenocarcinoma with negative EGFR mutation status post palliative radiotherapy to the left iliac bone and soft tissue mass. She is currently undergoing systemic chemotherapy with carboplatin and Alimta status post 4 cycles.  She is tolerating her treatment fairly well with no  specific complaints. I recommended for the patient to continue her treatment as scheduled. She will start cycle #5 today. For pain management, she will continue on Percocet 7.5/325 mg by mouth every 6 hours as needed for pain. The patient will come back for follow-up visit in 3 weeks with the start of cycle #6.  She was advised to call immediately if she has any concerning symptoms in the interval. The patient voices understanding of current disease status and treatment options and is in agreement with the current care plan.  All questions were answered. The patient knows to call the clinic with any problems, questions or concerns. We can certainly see the patient much sooner if necessary.  Disclaimer: This note was dictated with voice recognition software. Similar sounding words can inadvertently be transcribed and may not be corrected upon review.

## 2015-06-01 ENCOUNTER — Telehealth: Payer: Self-pay | Admitting: Internal Medicine

## 2015-06-01 MED FILL — AMOXICILLIN 500 MG CAPSULE: 500 | 7 days supply | Qty: 21 | Fill #0

## 2015-06-01 MED FILL — OXYCODONE-APAP 10-325 TAB: 10-325 | 4 days supply | Qty: 15 | Fill #0

## 2015-06-01 NOTE — Telephone Encounter (Signed)
Scheduled additional cycle. Pt can collect calendar at next md visit on 2/27.

## 2015-06-07 ENCOUNTER — Ambulatory Visit: Payer: Self-pay

## 2015-06-07 ENCOUNTER — Other Ambulatory Visit: Payer: Self-pay

## 2015-06-07 ENCOUNTER — Other Ambulatory Visit (HOSPITAL_BASED_OUTPATIENT_CLINIC_OR_DEPARTMENT_OTHER): Payer: Medicaid Other

## 2015-06-07 ENCOUNTER — Ambulatory Visit (HOSPITAL_BASED_OUTPATIENT_CLINIC_OR_DEPARTMENT_OTHER): Payer: Medicaid Other

## 2015-06-07 ENCOUNTER — Ambulatory Visit: Payer: Self-pay | Admitting: Internal Medicine

## 2015-06-07 DIAGNOSIS — C7951 Secondary malignant neoplasm of bone: Secondary | ICD-10-CM

## 2015-06-07 DIAGNOSIS — C3491 Malignant neoplasm of unspecified part of right bronchus or lung: Secondary | ICD-10-CM

## 2015-06-07 DIAGNOSIS — Z95828 Presence of other vascular implants and grafts: Secondary | ICD-10-CM

## 2015-06-07 LAB — COMPREHENSIVE METABOLIC PANEL
ALBUMIN: 3.9 g/dL (ref 3.5–5.0)
ALK PHOS: 75 U/L (ref 40–150)
ALT: 84 U/L — ABNORMAL HIGH (ref 0–55)
ANION GAP: 9 meq/L (ref 3–11)
AST: 52 U/L — ABNORMAL HIGH (ref 5–34)
BILIRUBIN TOTAL: 0.39 mg/dL (ref 0.20–1.20)
BUN: 10.9 mg/dL (ref 7.0–26.0)
CALCIUM: 9.2 mg/dL (ref 8.4–10.4)
CO2: 23 meq/L (ref 22–29)
CREATININE: 0.9 mg/dL (ref 0.6–1.1)
Chloride: 106 mEq/L (ref 98–109)
Glucose: 93 mg/dl (ref 70–140)
Potassium: 3.7 mEq/L (ref 3.5–5.1)
Sodium: 139 mEq/L (ref 136–145)
TOTAL PROTEIN: 7.5 g/dL (ref 6.4–8.3)

## 2015-06-07 LAB — CBC WITH DIFFERENTIAL/PLATELET
BASO%: 0.6 % (ref 0.0–2.0)
BASOS ABS: 0 10*3/uL (ref 0.0–0.1)
EOS ABS: 0 10*3/uL (ref 0.0–0.5)
EOS%: 1 % (ref 0.0–7.0)
HEMATOCRIT: 33.9 % — AB (ref 34.8–46.6)
HGB: 11.4 g/dL — ABNORMAL LOW (ref 11.6–15.9)
LYMPH%: 48.9 % (ref 14.0–49.7)
MCH: 36.1 pg — ABNORMAL HIGH (ref 25.1–34.0)
MCHC: 33.5 g/dL (ref 31.5–36.0)
MCV: 107.6 fL — AB (ref 79.5–101.0)
MONO#: 0.2 10*3/uL (ref 0.1–0.9)
MONO%: 10.3 % (ref 0.0–14.0)
NEUT#: 0.7 10*3/uL — ABNORMAL LOW (ref 1.5–6.5)
NEUT%: 39.2 % (ref 38.4–76.8)
NRBC: 0 % (ref 0–0)
PLATELETS: 138 10*3/uL — AB (ref 145–400)
RBC: 3.15 10*6/uL — AB (ref 3.70–5.45)
RDW: 15.9 % — AB (ref 11.2–14.5)
WBC: 1.8 10*3/uL — ABNORMAL LOW (ref 3.9–10.3)
lymph#: 0.9 10*3/uL (ref 0.9–3.3)

## 2015-06-07 MED ORDER — HEPARIN SOD (PORK) LOCK FLUSH 100 UNIT/ML IV SOLN
500.0000 [IU] | Freq: Once | INTRAVENOUS | Status: AC
  Administered 2015-06-07: 500 [IU] via INTRAVENOUS
  Filled 2015-06-07: qty 5

## 2015-06-07 MED ORDER — SODIUM CHLORIDE 0.9% FLUSH
10.0000 mL | INTRAVENOUS | Status: DC | PRN
  Administered 2015-06-07: 10 mL via INTRAVENOUS
  Filled 2015-06-07: qty 10

## 2015-06-07 NOTE — Patient Instructions (Signed)

## 2015-06-08 ENCOUNTER — Other Ambulatory Visit: Payer: Self-pay

## 2015-06-14 ENCOUNTER — Encounter (HOSPITAL_COMMUNITY): Payer: Self-pay | Admitting: Dentistry

## 2015-06-14 ENCOUNTER — Encounter (INDEPENDENT_AMBULATORY_CARE_PROVIDER_SITE_OTHER): Payer: Self-pay

## 2015-06-14 ENCOUNTER — Telehealth: Payer: Self-pay | Admitting: Medical Oncology

## 2015-06-14 ENCOUNTER — Ambulatory Visit (HOSPITAL_BASED_OUTPATIENT_CLINIC_OR_DEPARTMENT_OTHER): Payer: Medicaid Other

## 2015-06-14 ENCOUNTER — Ambulatory Visit (HOSPITAL_COMMUNITY): Payer: Medicaid - Dental | Admitting: Dentistry

## 2015-06-14 ENCOUNTER — Other Ambulatory Visit (HOSPITAL_BASED_OUTPATIENT_CLINIC_OR_DEPARTMENT_OTHER): Payer: Medicaid Other

## 2015-06-14 VITALS — BP 95/62 | HR 99 | Temp 98.2°F

## 2015-06-14 VITALS — BP 110/70 | HR 86 | Temp 98.2°F | Resp 18

## 2015-06-14 DIAGNOSIS — C7951 Secondary malignant neoplasm of bone: Secondary | ICD-10-CM

## 2015-06-14 DIAGNOSIS — C3491 Malignant neoplasm of unspecified part of right bronchus or lung: Secondary | ICD-10-CM | POA: Diagnosis present

## 2015-06-14 DIAGNOSIS — K08409 Partial loss of teeth, unspecified cause, unspecified class: Secondary | ICD-10-CM

## 2015-06-14 DIAGNOSIS — K036 Deposits [accretions] on teeth: Secondary | ICD-10-CM

## 2015-06-14 DIAGNOSIS — K0530 Chronic periodontitis, unspecified: Secondary | ICD-10-CM

## 2015-06-14 DIAGNOSIS — Z95828 Presence of other vascular implants and grafts: Secondary | ICD-10-CM

## 2015-06-14 LAB — CBC WITH DIFFERENTIAL/PLATELET
BASO%: 0.4 % (ref 0.0–2.0)
Basophils Absolute: 0 10*3/uL (ref 0.0–0.1)
EOS ABS: 0 10*3/uL (ref 0.0–0.5)
EOS%: 0.9 % (ref 0.0–7.0)
HEMATOCRIT: 32.5 % — AB (ref 34.8–46.6)
HGB: 11 g/dL — ABNORMAL LOW (ref 11.6–15.9)
LYMPH#: 0.7 10*3/uL — AB (ref 0.9–3.3)
LYMPH%: 29.9 % (ref 14.0–49.7)
MCH: 36.8 pg — ABNORMAL HIGH (ref 25.1–34.0)
MCHC: 33.8 g/dL (ref 31.5–36.0)
MCV: 108.7 fL — AB (ref 79.5–101.0)
MONO#: 0.3 10*3/uL (ref 0.1–0.9)
MONO%: 13.5 % (ref 0.0–14.0)
NEUT%: 55.3 % (ref 38.4–76.8)
NEUTROS ABS: 1.4 10*3/uL — AB (ref 1.5–6.5)
PLATELETS: 100 10*3/uL — AB (ref 145–400)
RBC: 2.99 10*6/uL — ABNORMAL LOW (ref 3.70–5.45)
RDW: 15.9 % — ABNORMAL HIGH (ref 11.2–14.5)
WBC: 2.4 10*3/uL — ABNORMAL LOW (ref 3.9–10.3)

## 2015-06-14 LAB — COMPREHENSIVE METABOLIC PANEL
ALT: 81 U/L — AB (ref 0–55)
ANION GAP: 8 meq/L (ref 3–11)
AST: 56 U/L — ABNORMAL HIGH (ref 5–34)
Albumin: 3.8 g/dL (ref 3.5–5.0)
Alkaline Phosphatase: 71 U/L (ref 40–150)
BILIRUBIN TOTAL: 0.46 mg/dL (ref 0.20–1.20)
BUN: 9.4 mg/dL (ref 7.0–26.0)
CALCIUM: 9.5 mg/dL (ref 8.4–10.4)
CO2: 24 mEq/L (ref 22–29)
CREATININE: 1 mg/dL (ref 0.6–1.1)
Chloride: 108 mEq/L (ref 98–109)
EGFR: 82 mL/min/{1.73_m2} — ABNORMAL LOW (ref >90)
Glucose: 114 mg/dl (ref 70–140)
Potassium: 3.8 mEq/L (ref 3.5–5.1)
Sodium: 140 mEq/L (ref 136–145)
TOTAL PROTEIN: 7.2 g/dL (ref 6.4–8.3)

## 2015-06-14 MED ORDER — HEPARIN SOD (PORK) LOCK FLUSH 100 UNIT/ML IV SOLN
500.0000 [IU] | Freq: Once | INTRAVENOUS | Status: AC
  Administered 2015-06-14: 500 [IU] via INTRAVENOUS
  Filled 2015-06-14: qty 5

## 2015-06-14 MED ORDER — OXYCODONE-ACETAMINOPHEN 7.5-325 MG PO TABS: 1.0000 | ORAL_TABLET | Freq: Four times a day (QID) | ORAL | Status: DC | PRN

## 2015-06-14 MED ORDER — SODIUM CHLORIDE 0.9% FLUSH
10.0000 mL | INTRAVENOUS | Status: DC | PRN
  Administered 2015-06-14: 10 mL via INTRAVENOUS
  Filled 2015-06-14: qty 10

## 2015-06-14 MED FILL — OXYCODONE-APAP 7.5-325 MG: 7.5-325 | 15 days supply | Qty: 60 | Fill #0

## 2015-06-14 NOTE — Telephone Encounter (Signed)
Requesting refill on oxycodone 7/325 for back pain and "where my mass use to be , I guess it is healing pains" She just finished oxycodone rx 10/325 from Dr. Benson Norway  post dental extractions and alveoloplasty 06/01/15. Note to [place].

## 2015-06-14 NOTE — Telephone Encounter (Signed)
Pain med rx  given to pt and massage rx form signed and given to pt.

## 2015-06-14 NOTE — Progress Notes (Signed)
LIMITED ORAL EXAMINATION:  06/14/2015 Andrea [name] [id number]  VITALS: BP 95/62 mmHg  Pulse 99  Temp(Src) 98.2 F (36.8 C) (Oral)  LABS:  Lab Results  Component Value Date   WBC 1.8* 06/07/2015   HGB 11.4* 06/07/2015   HCT 33.9* 06/07/2015   MCV 107.6* 06/07/2015   PLT 138* 06/07/2015   BMET    Component Value Date/Time   NA 139 06/07/2015 1134   NA 141 03/08/2015 1100   K 3.7 06/07/2015 1134   K 4.2 03/08/2015 1100   CL 111 03/08/2015 1100   CO2 23 06/07/2015 1134   CO2 22 03/08/2015 1100   GLUCOSE 93 06/07/2015 1134   GLUCOSE 92 03/08/2015 1100   BUN 10.9 06/07/2015 1134   BUN 8 03/08/2015 1100   CREATININE 0.9 06/07/2015 1134   CREATININE 0.77 03/08/2015 1100   CREATININE 0.85 01/08/2014 1637   CALCIUM 9.2 06/07/2015 1134   CALCIUM 9.5 03/08/2015 1100   GFRNONAA >60 03/08/2015 1100   GFRNONAA 85 01/08/2014 1637   GFRAA >60 03/08/2015 1100   GFRAA >89 01/08/2014 1637    Lab Results  Component Value Date   INR 0.89 03/08/2015   INR 1.12 12/21/2014   No results found for: PTT   Andrea [name] is status post extraction of tooth numbers 2 and 15 with alveoloplasty on 05/31/2013 with Dr. Benson Norway. The patient now presents for Limited oral examination and evaluation of healing and to discuss referral to [facility] for periodontal therapy and maintenance dental care.  SUBJECTIVE: Patient with minimal discomfort from dental extraction sites. Patient is using salt water rinses as needed to aid healing. Patient denies having any stitches that remain.  EXAM: There is no sign of infection, heme, or ooze. Sutures are gone. Patient is healing in by generalized primary closure. Extraction site #15 is healing in by secondary intention in the area of tooth #15.  Good oral hygiene with minimal accretions.  PROCEDURE: The patient was given a chlorhexidine gluconate rinse for 30 seconds. No sutures needed to be removed.  ASSESSMENT: Post operative course  is consistent with dental procedures performed by the oral surgeon.  PLAN: 1. Use salt water rinses as needed to aid healing. 2. Brush after meals and at bedtime. 3. Follow-up with [facility] for periodontal therapy and maintenance dental therapy. This is to be coordinated with chemotherapy given on an every 3 week basis. 4. Dr. Julien Nordmann to hold off on Xgeva therapy for additional 2 months of healing if at all possible.   Lenn Cal, DDS

## 2015-06-14 NOTE — Patient Instructions (Addendum)
  PLAN: 1. Use salt water rinses as needed to aid healing. 2. Brush after meals and at bedtime. 3. Follow-up with [facility] for periodontal therapy and maintenance dental therapy. This is to be coordinated with chemotherapy given on an every 3 week basis. 4. Dr. Julien Nordmann to hold off on Xgeva therapy for additional 2 months of healing if at all possible.   Lenn Cal, DDS

## 2015-06-14 NOTE — Patient Instructions (Signed)

## 2015-06-18 ENCOUNTER — Other Ambulatory Visit: Payer: Self-pay | Admitting: Medical Oncology

## 2015-06-18 DIAGNOSIS — C3491 Malignant neoplasm of unspecified part of right bronchus or lung: Secondary | ICD-10-CM

## 2015-06-21 ENCOUNTER — Ambulatory Visit (HOSPITAL_BASED_OUTPATIENT_CLINIC_OR_DEPARTMENT_OTHER): Payer: Medicaid Other | Admitting: Internal Medicine

## 2015-06-21 ENCOUNTER — Telehealth: Payer: Self-pay | Admitting: Internal Medicine

## 2015-06-21 ENCOUNTER — Ambulatory Visit: Payer: Medicaid Other

## 2015-06-21 ENCOUNTER — Ambulatory Visit: Payer: Medicaid Other | Admitting: Nutrition

## 2015-06-21 ENCOUNTER — Encounter: Payer: Self-pay | Admitting: Internal Medicine

## 2015-06-21 ENCOUNTER — Ambulatory Visit (HOSPITAL_BASED_OUTPATIENT_CLINIC_OR_DEPARTMENT_OTHER): Payer: Medicaid Other

## 2015-06-21 ENCOUNTER — Encounter: Payer: Self-pay | Admitting: *Deleted

## 2015-06-21 ENCOUNTER — Other Ambulatory Visit (HOSPITAL_BASED_OUTPATIENT_CLINIC_OR_DEPARTMENT_OTHER): Payer: Medicaid Other

## 2015-06-21 VITALS — BP 123/79 | HR 90 | Temp 97.7°F | Resp 18 | Ht 66.0 in | Wt 164.7 lb

## 2015-06-21 DIAGNOSIS — C7951 Secondary malignant neoplasm of bone: Secondary | ICD-10-CM

## 2015-06-21 DIAGNOSIS — Z5111 Encounter for antineoplastic chemotherapy: Secondary | ICD-10-CM

## 2015-06-21 DIAGNOSIS — C3491 Malignant neoplasm of unspecified part of right bronchus or lung: Secondary | ICD-10-CM

## 2015-06-21 LAB — COMPREHENSIVE METABOLIC PANEL
ALT: 49 U/L (ref 0–55)
AST: 31 U/L (ref 5–34)
Albumin: 4 g/dL (ref 3.5–5.0)
Alkaline Phosphatase: 72 U/L (ref 40–150)
Anion Gap: 11 mEq/L (ref 3–11)
BILIRUBIN TOTAL: 0.3 mg/dL (ref 0.20–1.20)
BUN: 8.2 mg/dL (ref 7.0–26.0)
CHLORIDE: 108 meq/L (ref 98–109)
CO2: 19 meq/L — AB (ref 22–29)
Calcium: 10.1 mg/dL (ref 8.4–10.4)
Creatinine: 1 mg/dL (ref 0.6–1.1)
EGFR: 76 mL/min/{1.73_m2} — AB (ref >90)
GLUCOSE: 149 mg/dL — AB (ref 70–140)
POTASSIUM: 3.9 meq/L (ref 3.5–5.1)
SODIUM: 138 meq/L (ref 136–145)
TOTAL PROTEIN: 8 g/dL (ref 6.4–8.3)

## 2015-06-21 LAB — CBC WITH DIFFERENTIAL/PLATELET
BASO%: 0.4 % (ref 0.0–2.0)
Basophils Absolute: 0 10*3/uL (ref 0.0–0.1)
EOS ABS: 0 10*3/uL (ref 0.0–0.5)
EOS%: 0 % (ref 0.0–7.0)
HCT: 31.7 % — ABNORMAL LOW (ref 34.8–46.6)
HGB: 10.8 g/dL — ABNORMAL LOW (ref 11.6–15.9)
LYMPH%: 20.5 % (ref 14.0–49.7)
MCH: 37.1 pg — ABNORMAL HIGH (ref 25.1–34.0)
MCHC: 34.1 g/dL (ref 31.5–36.0)
MCV: 108.8 fL — AB (ref 79.5–101.0)
MONO#: 0.2 10*3/uL (ref 0.1–0.9)
MONO%: 6.2 % (ref 0.0–14.0)
NEUT%: 72.9 % (ref 38.4–76.8)
NEUTROS ABS: 2.1 10*3/uL (ref 1.5–6.5)
Platelets: 155 10*3/uL (ref 145–400)
RBC: 2.91 10*6/uL — AB (ref 3.70–5.45)
RDW: 15 % — ABNORMAL HIGH (ref 11.2–14.5)
WBC: 2.9 10*3/uL — AB (ref 3.9–10.3)
lymph#: 0.6 10*3/uL — ABNORMAL LOW (ref 0.9–3.3)

## 2015-06-21 MED ORDER — SODIUM CHLORIDE 0.9 % IV SOLN
Freq: Once | INTRAVENOUS | Status: AC
  Administered 2015-06-21: 12:00:00 via INTRAVENOUS

## 2015-06-21 MED ORDER — SODIUM CHLORIDE 0.9 % IV SOLN
400.0000 mg/m2 | Freq: Once | INTRAVENOUS | Status: AC
  Administered 2015-06-21: 725 mg via INTRAVENOUS
  Filled 2015-06-21: qty 24

## 2015-06-21 MED ORDER — SODIUM CHLORIDE 0.9 % IJ SOLN
10.0000 mL | INTRAMUSCULAR | Status: DC | PRN
  Administered 2015-06-21: 10 mL
  Filled 2015-06-21: qty 10

## 2015-06-21 MED ORDER — SODIUM CHLORIDE 0.9 % IV SOLN
Freq: Once | INTRAVENOUS | Status: AC
  Administered 2015-06-21: 13:00:00 via INTRAVENOUS
  Filled 2015-06-21: qty 8

## 2015-06-21 MED ORDER — HEPARIN SOD (PORK) LOCK FLUSH 100 UNIT/ML IV SOLN
500.0000 [IU] | Freq: Once | INTRAVENOUS | Status: AC | PRN
  Administered 2015-06-21: 500 [IU]
  Filled 2015-06-21: qty 5

## 2015-06-21 MED ORDER — SODIUM CHLORIDE 0.9% FLUSH
10.0000 mL | INTRAVENOUS | Status: DC | PRN
  Administered 2015-06-21: 10 mL via INTRAVENOUS
  Filled 2015-06-21: qty 10

## 2015-06-21 MED ORDER — CYANOCOBALAMIN 1000 MCG/ML IJ SOLN
INTRAMUSCULAR | Status: AC
  Filled 2015-06-21: qty 1

## 2015-06-21 MED ORDER — CYANOCOBALAMIN 1000 MCG/ML IJ SOLN
1000.0000 ug | Freq: Once | INTRAMUSCULAR | Status: AC
  Administered 2015-06-21: 1000 ug via INTRAMUSCULAR

## 2015-06-21 MED ORDER — SODIUM CHLORIDE 0.9 % IV SOLN
526.5000 mg | Freq: Once | INTRAVENOUS | Status: AC
  Administered 2015-06-21: 530 mg via INTRAVENOUS
  Filled 2015-06-21: qty 53

## 2015-06-21 NOTE — Telephone Encounter (Signed)
Schedule remained as is. Pt got an updated avs printed

## 2015-06-21 NOTE — Progress Notes (Signed)
Patient requesting nutrition supplement samples.  She doesn't care for Ensure Enlive.  States MD wants her to drink Ensure. Provided patient with a variety of oral nutrition supplements. States she will contact me with questions.

## 2015-06-21 NOTE — Patient Instructions (Signed)
[facility] Discharge Instructions for Patients Receiving Chemotherapy  Today you received the following chemotherapy agents, Alimta and Paraplatin.  To help prevent nausea and vomiting after your treatment, we encourage you to take your nausea medication, Compazine.    If you develop nausea and vomiting that is not controlled by your nausea medication, call the clinic.   BELOW ARE SYMPTOMS THAT SHOULD BE REPORTED IMMEDIATELY:  *FEVER GREATER THAN 100.5 F  *CHILLS WITH OR WITHOUT FEVER  NAUSEA AND VOMITING THAT IS NOT CONTROLLED WITH YOUR NAUSEA MEDICATION  *UNUSUAL SHORTNESS OF BREATH  *UNUSUAL BRUISING OR BLEEDING  TENDERNESS IN MOUTH AND THROAT WITH OR WITHOUT PRESENCE OF ULCERS  *URINARY PROBLEMS  *BOWEL PROBLEMS  UNUSUAL RASH Items with * indicate a potential emergency and should be followed up as soon as possible.  Feel free to call the clinic you have any questions or concerns. The clinic phone number is [phone].  Please show the [facility] at check-in to the Emergency Department and triage nurse.

## 2015-06-21 NOTE — Progress Notes (Signed)
[facility] [phone]   [phone]  OFFICE PROGRESS NOTE  [name], MD [place] [address]  DIAGNOSIS: Stage IV (T1a, N2, M1b) non-small cell lung cancer, adenocarcinoma with negative EGFR mutation diagnosed in August 2016 and presented with right hilar and subcarinal lymphadenopathy as well as large soft tissue mass in the left posterior iliac bone.  PRIOR THERAPY: Status post palliative radiotherapy to the soft tissue mass in the left iliac bone under the care of Dr. Valere Dross  CURRENT THERAPY: Systemic chemotherapy with carboplatin for AUC of 5 and Alimta 500 MG/M2 every 3 weeks. First dose expected on 02/22/2015. Status post 5 cycles. Starting from cycle #3 Alimta was reduced to 400 MG/M2 secondary to neutropenia after the first 2 cycles.  INTERVAL HISTORY: Andrea [name] 45 y.o. female returns to the clinic today for follow-up visit. The patient is feeling fine today with no specific complaints except for persistent pain in the left iliac area and lower back. 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 She is currently on Percocet 7.5/325 mg by mouth every 6 hours as needed for pain. She was given Percocet 10/325 mg by her dentist and felt much better on this higher dose. She is requesting an increase of her dose of Percocet 10/325 mg but she just had refill of her Percocet 7.5/325 mg last week. She tolerated the last cycles of her systemic chemotherapy with carboplatin and Alimta fairly well . She denied having any significant chest pain, shortness of breath, cough or hemoptysis. She has no significant weight loss or night sweats. She has no headache or visual changes. She is here today to start cycle #6. He also received clearance from her dentist to start treatment with Xgeva and few  weeks.  MEDICAL HISTORY: Past Medical History  Diagnosis Date  . Low back pain radiating to left leg 11/29/2010  . Dysmenorrhea 12/06/2009    Qualifier: Diagnosis of  By: [name] RN, Regino Schultze    . Chronic back pain   . Family history of adverse reaction to anesthesia     mother stopped breathing after surgery  . Anxiety   . Depression   . Headache     stress  . S/P radiation therapy 01/01/2015 through 01/21/2015      Left iliac bone 3500 cGy in 14 sessions   . Shortness of breath dyspnea     with exertion, Pt denies 02/2015  . Anginal pain ([place] [facility])     chest pain not sure what related to  . Non-small cell carcinoma of right lung, stage 4 ([place]) 01/01/2015    ALLERGIES:  is allergic to morphine and related and hydrocodone.  MEDICATIONS:  Current Outpatient Prescriptions  Medication Sig Dispense Refill  . cyanocobalamin (,VITAMIN B-12,) 1000 MCG/ML injection Inject 1,000 mcg into the muscle once.    [name] dexamethasone (DECADRON) 4 MG tablet 4 mg po bid, the day before, day of and day after chemo 40 tablet 1  . folic acid (FOLVITE) 1 MG tablet Take 1 tablet (1 mg total) by mouth daily. 30 tablet 4  . guaiFENesin-dextromethorphan (ROBITUSSIN DM) 100-10 MG/5ML syrup Take 5 mLs by mouth every 4 (four) hours as needed for cough.    . lidocaine-prilocaine (EMLA) cream Apply 1 teaspoon to skin 1 to 2 hours before use  cover to keep in contact with skin 30 g PRN  . Multiple Vitamins-Minerals (MULTIVITAMIN & MINERAL PO) Take 2 tablets by mouth daily.     [name] oxyCODONE-acetaminophen (PERCOCET) 7.5-325 MG tablet Take 1 tablet by mouth every 6 (six) hours as needed for severe pain. 60 tablet 0  . Prenatal Vit-Fe Fumarate-FA (PRENATAL VITAMIN PO) Take 1 tablet by mouth daily.    [name] senna-docusate (SENOKOT-S) 8.6-50 MG per tablet Take 2 tablets by mouth at bedtime. 30 tablet 0  . amoxicillin (AMOXIL) 500 MG capsule Take 500 mg by mouth as  directed. Reported on 06/21/2015  0  . feeding supplement, ENSURE ENLIVE, (ENSURE ENLIVE) LIQD Take 237 mLs by mouth 3 (three) times daily between meals. (Patient not taking: Reported on 05/11/2015) 237 mL 12  . polyethylene glycol (MIRALAX / GLYCOLAX) packet Take 17 g by mouth daily as needed for mild constipation. (Patient not taking: Reported on 04/07/2015) 14 each 0  . prochlorperazine (COMPAZINE) 10 MG tablet Take 1 tablet (10 mg total) by mouth every 6 (six) hours as needed for nausea or vomiting. (Patient not taking: Reported on 05/11/2015) 60 tablet 0   No current facility-administered medications for this visit.    SURGICAL HISTORY:  Past Surgical History  Procedure Laterality Date  . Laproscopy surgery to check her tubes    . Lumbar laminectomy/decompression microdiscectomy Left 05/01/2014    Procedure: Microdiscectomy - L5-S1 - left;  Surgeon: Eustace Moore, MD;  Location: [facility];  Service: Neurosurgery;  Laterality: Left;    REVIEW OF SYSTEMS:  A comprehensive review of systems was negative except for: Musculoskeletal: positive for bone pain   PHYSICAL EXAMINATION: General appearance: alert, cooperative, fatigued and no distress Head: Normocephalic, without obvious abnormality, atraumatic Neck: no adenopathy, no JVD, supple, symmetrical, trachea midline and thyroid not enlarged, symmetric, no tenderness/mass/nodules Lymph nodes: Cervical, supraclavicular, and axillary nodes normal. Resp: clear to auscultation bilaterally Back: symmetric, no curvature. ROM normal. No CVA tenderness. Cardio: regular rate and rhythm, S1, S2 normal, no murmur, click, rub or gallop GI: soft, non-tender; bowel sounds normal; no masses,  no organomegaly Extremities: extremities normal, atraumatic, no cyanosis or edema Neurologic: Alert and oriented X 3, normal strength and tone. Normal symmetric reflexes. Normal coordination and gait  ECOG PERFORMANCE STATUS: 1 - Symptomatic but completely  ambulatory  Blood pressure 123/79, pulse 90, temperature 97.7 F (36.5 C), temperature source Oral, resp. rate 18, height 5' 6"  (1.676 m), weight 164 lb 11.2 oz (74.707 kg), SpO2 100 %.  LABORATORY DATA: Lab Results  Component Value Date   WBC 2.9* 06/21/2015   HGB 10.8* 06/21/2015   HCT 31.7* 06/21/2015   MCV 108.8* 06/21/2015   PLT 155 06/21/2015      Chemistry      Component Value Date/Time   NA 140 06/14/2015 1228   NA 141 03/08/2015 1100   K 3.8 06/14/2015 1228   K 4.2 03/08/2015 1100   CL 111 03/08/2015 1100   CO2 24 06/14/2015 1228   CO2 22 03/08/2015 1100   BUN 9.4 06/14/2015 1228   BUN 8 03/08/2015 1100   CREATININE 1.0 06/14/2015 1228   CREATININE 0.77 03/08/2015 1100   CREATININE 0.85 01/08/2014 1637      Component Value Date/Time   CALCIUM 9.5 06/14/2015 1228   CALCIUM 9.5 03/08/2015 1100   ALKPHOS 71 06/14/2015 1228   ALKPHOS 74 04/20/2013 1320   AST 56* 06/14/2015 1228   AST 17 04/20/2013 1320   ALT 81* 06/14/2015 1228  ALT 14 04/20/2013 1320   BILITOT 0.46 06/14/2015 1228   BILITOT 0.5 04/20/2013 1320       RADIOGRAPHIC STUDIES: No results found.  ASSESSMENT AND PLAN: This is a very pleasant 45 years old African-American female recently diagnosed with metastatic non-small cell lung cancer, adenocarcinoma with negative EGFR mutation status post palliative radiotherapy to the left iliac bone and soft tissue mass. She is currently undergoing systemic chemotherapy with carboplatin and Alimta status post 5 cycles.  She is tolerating her treatment fairly well with no specific complaints. I recommended for the patient to continue her treatment as scheduled. She will start cycle #6 today. I will see her back for follow-up visit in 3 weeks for reevaluation after repeating CT scan of the chest, abdomen and pelvis for restaging of her disease. For pain management, she will continue on Percocet 7.5/325 mg by mouth every 6 hours as needed for pain but I will  increase the dose of Percocet to 10/325 mg by mouth every 6 hours starting from the next refill. For the metastatic bone disease, I would consider the patient for treatment with Xgeva after her next visit in 3 weeks. She was advised to call immediately if she has any concerning symptoms in the interval. The patient voices understanding of current disease status and treatment options and is in agreement with the current care plan.  All questions were answered. The patient knows to call the clinic with any problems, questions or concerns. We can certainly see the patient much sooner if necessary.  Disclaimer: This note was dictated with voice recognition software. Similar sounding words can inadvertently be transcribed and may not be corrected upon review.

## 2015-06-21 NOTE — Patient Instructions (Signed)

## 2015-06-21 NOTE — Progress Notes (Signed)
Oncology Nurse Navigator Documentation  Oncology Nurse Navigator Flowsheets 06/21/2015  Navigator Location [facility]-Med Onc  Navigator Encounter Type Clinic/MDC/spoke with patient today at [facility].  She is doing well with treatment.  No barriers identified at this time   Patient Visit Type Follow-up;MedOnc  Treatment Phase Treatment  Barriers/Navigation Needs No barriers at this time  Acuity Level 1  Time Spent with Patient 15

## 2015-06-22 ENCOUNTER — Ambulatory Visit (INDEPENDENT_AMBULATORY_CARE_PROVIDER_SITE_OTHER): Payer: Medicaid Other | Admitting: *Deleted

## 2015-06-22 DIAGNOSIS — Z3042 Encounter for surveillance of injectable contraceptive: Secondary | ICD-10-CM | POA: Diagnosis not present

## 2015-06-22 MED ORDER — MEDROXYPROGESTERONE ACETATE 150 MG/ML IM SUSP
150.0000 mg | INTRAMUSCULAR | Status: DC
  Administered 2015-06-22: 150 mg via INTRAMUSCULAR

## 2015-06-28 ENCOUNTER — Other Ambulatory Visit: Payer: Self-pay | Admitting: *Deleted

## 2015-06-28 ENCOUNTER — Ambulatory Visit: Payer: Self-pay

## 2015-06-28 ENCOUNTER — Other Ambulatory Visit (HOSPITAL_BASED_OUTPATIENT_CLINIC_OR_DEPARTMENT_OTHER): Payer: Medicaid Other

## 2015-06-28 ENCOUNTER — Telehealth: Payer: Self-pay | Admitting: Internal Medicine

## 2015-06-28 ENCOUNTER — Telehealth: Payer: Self-pay | Admitting: *Deleted

## 2015-06-28 ENCOUNTER — Ambulatory Visit (HOSPITAL_BASED_OUTPATIENT_CLINIC_OR_DEPARTMENT_OTHER): Payer: Medicaid Other

## 2015-06-28 VITALS — BP 127/69 | HR 82 | Temp 98.6°F | Resp 18

## 2015-06-28 DIAGNOSIS — C3491 Malignant neoplasm of unspecified part of right bronchus or lung: Secondary | ICD-10-CM

## 2015-06-28 DIAGNOSIS — Z95828 Presence of other vascular implants and grafts: Secondary | ICD-10-CM

## 2015-06-28 LAB — COMPREHENSIVE METABOLIC PANEL
ALT: 42 U/L (ref 0–55)
ANION GAP: 9 meq/L (ref 3–11)
AST: 32 U/L (ref 5–34)
Albumin: 3.6 g/dL (ref 3.5–5.0)
Alkaline Phosphatase: 70 U/L (ref 40–150)
BILIRUBIN TOTAL: 0.31 mg/dL (ref 0.20–1.20)
BUN: 7.3 mg/dL (ref 7.0–26.0)
CHLORIDE: 107 meq/L (ref 98–109)
CO2: 23 meq/L (ref 22–29)
Calcium: 9 mg/dL (ref 8.4–10.4)
Creatinine: 1 mg/dL (ref 0.6–1.1)
EGFR: 85 mL/min/{1.73_m2} — AB (ref >90)
Glucose: 112 mg/dl (ref 70–140)
Potassium: 3.9 mEq/L (ref 3.5–5.1)
Sodium: 139 mEq/L (ref 136–145)
Total Protein: 6.9 g/dL (ref 6.4–8.3)

## 2015-06-28 LAB — CBC WITH DIFFERENTIAL/PLATELET
BASO%: 0.5 % (ref 0.0–2.0)
Basophils Absolute: 0 10*3/uL (ref 0.0–0.1)
EOS ABS: 0 10*3/uL (ref 0.0–0.5)
EOS%: 0.4 % (ref 0.0–7.0)
HCT: 29.3 % — ABNORMAL LOW (ref 34.8–46.6)
HGB: 10.1 g/dL — ABNORMAL LOW (ref 11.6–15.9)
LYMPH%: 67.7 % — AB (ref 14.0–49.7)
MCH: 37.2 pg — AB (ref 25.1–34.0)
MCHC: 34.3 g/dL (ref 31.5–36.0)
MCV: 108.4 fL — AB (ref 79.5–101.0)
MONO#: 0.2 10*3/uL (ref 0.1–0.9)
MONO%: 14.1 % — ABNORMAL HIGH (ref 0.0–14.0)
NEUT#: 0.2 10*3/uL — CL (ref 1.5–6.5)
NEUT%: 17.3 % — AB (ref 38.4–76.8)
PLATELETS: 149 10*3/uL (ref 145–400)
RBC: 2.71 10*6/uL — AB (ref 3.70–5.45)
RDW: 14.5 % (ref 11.2–14.5)
WBC: 1.2 10*3/uL — ABNORMAL LOW (ref 3.9–10.3)
lymph#: 0.8 10*3/uL — ABNORMAL LOW (ref 0.9–3.3)

## 2015-06-28 MED ORDER — HEPARIN SOD (PORK) LOCK FLUSH 100 UNIT/ML IV SOLN
500.0000 [IU] | Freq: Once | INTRAVENOUS | Status: AC
  Administered 2015-06-28: 500 [IU] via INTRAVENOUS
  Filled 2015-06-28: qty 5

## 2015-06-28 MED ORDER — SODIUM CHLORIDE 0.9% FLUSH
10.0000 mL | INTRAVENOUS | Status: DC | PRN
  Administered 2015-06-28: 10 mL via INTRAVENOUS
  Filled 2015-06-28: qty 10

## 2015-06-28 NOTE — Patient Instructions (Signed)

## 2015-06-28 NOTE — Telephone Encounter (Signed)
Left message for patient re injections appointments 3/6 and 3/7. Message to desk nurse to confirm if patient has already has injection today. Patient also asked to call me re confirming appointments and if she has already had injection today.

## 2015-06-28 NOTE — Telephone Encounter (Signed)
Patient returned call re injection appointments for today and tomorrow. Per patient she cannot return today due to transportation. Desk nurse informed and per desk nurse injections scheduled for 3/7 and 3/8. Patient has both appointment dates/times.

## 2015-06-28 NOTE — Telephone Encounter (Signed)
lmovm for pt to call [facility] office regarding injections. Spoke with Audie Clear who advised pt unable to come today. POF sent with new injection appts for 3/7 and 3/8

## 2015-06-28 NOTE — Progress Notes (Signed)
Labs reviewed with MD, VO Granix 383mg 3/6 and 3/7. POF to scheduling Notified pt

## 2015-06-29 ENCOUNTER — Ambulatory Visit (HOSPITAL_BASED_OUTPATIENT_CLINIC_OR_DEPARTMENT_OTHER): Payer: Medicaid Other

## 2015-06-29 VITALS — BP 105/64 | HR 99 | Temp 98.1°F

## 2015-06-29 DIAGNOSIS — D701 Agranulocytosis secondary to cancer chemotherapy: Secondary | ICD-10-CM

## 2015-06-29 DIAGNOSIS — C3491 Malignant neoplasm of unspecified part of right bronchus or lung: Secondary | ICD-10-CM

## 2015-06-29 DIAGNOSIS — C7951 Secondary malignant neoplasm of bone: Secondary | ICD-10-CM

## 2015-06-29 MED ORDER — TBO-FILGRASTIM 300 MCG/0.5ML ~~LOC~~ SOSY
300.0000 ug | PREFILLED_SYRINGE | Freq: Once | SUBCUTANEOUS | Status: AC
  Administered 2015-06-29: 300 ug via SUBCUTANEOUS
  Filled 2015-06-29: qty 0.5

## 2015-06-29 NOTE — Patient Instructions (Signed)

## 2015-06-29 NOTE — Progress Notes (Signed)
Discussed neutropenic precautions and gave face mask to use--good handwashing, staying out of crowds and away from people who are sick.  Talked about not eating foods that aren't cooked and avoiding food bars and [employer].

## 2015-06-30 ENCOUNTER — Ambulatory Visit (HOSPITAL_BASED_OUTPATIENT_CLINIC_OR_DEPARTMENT_OTHER): Payer: Medicaid Other

## 2015-06-30 VITALS — BP 113/67 | HR 102 | Temp 98.6°F

## 2015-06-30 DIAGNOSIS — C3491 Malignant neoplasm of unspecified part of right bronchus or lung: Secondary | ICD-10-CM | POA: Diagnosis not present

## 2015-06-30 DIAGNOSIS — D701 Agranulocytosis secondary to cancer chemotherapy: Secondary | ICD-10-CM | POA: Diagnosis present

## 2015-06-30 DIAGNOSIS — C7951 Secondary malignant neoplasm of bone: Secondary | ICD-10-CM

## 2015-06-30 MED ORDER — TBO-FILGRASTIM 300 MCG/0.5ML ~~LOC~~ SOSY
300.0000 ug | PREFILLED_SYRINGE | Freq: Once | SUBCUTANEOUS | Status: AC
  Administered 2015-06-30: 300 ug via SUBCUTANEOUS
  Filled 2015-06-30: qty 0.5

## 2015-07-05 ENCOUNTER — Ambulatory Visit (HOSPITAL_BASED_OUTPATIENT_CLINIC_OR_DEPARTMENT_OTHER): Payer: Medicaid Other

## 2015-07-05 ENCOUNTER — Other Ambulatory Visit (HOSPITAL_BASED_OUTPATIENT_CLINIC_OR_DEPARTMENT_OTHER): Payer: Medicaid Other

## 2015-07-05 ENCOUNTER — Other Ambulatory Visit: Payer: Self-pay | Admitting: Medical Oncology

## 2015-07-05 ENCOUNTER — Telehealth: Payer: Self-pay | Admitting: Internal Medicine

## 2015-07-05 VITALS — BP 116/74 | HR 85 | Temp 97.8°F

## 2015-07-05 VITALS — BP 119/76 | HR 87 | Temp 98.5°F | Resp 18

## 2015-07-05 DIAGNOSIS — C3491 Malignant neoplasm of unspecified part of right bronchus or lung: Secondary | ICD-10-CM

## 2015-07-05 DIAGNOSIS — T451X5A Adverse effect of antineoplastic and immunosuppressive drugs, initial encounter: Principal | ICD-10-CM

## 2015-07-05 DIAGNOSIS — D701 Agranulocytosis secondary to cancer chemotherapy: Secondary | ICD-10-CM

## 2015-07-05 DIAGNOSIS — Z95828 Presence of other vascular implants and grafts: Secondary | ICD-10-CM

## 2015-07-05 LAB — CBC WITH DIFFERENTIAL/PLATELET
BASO%: 0.5 % (ref 0.0–2.0)
Basophils Absolute: 0 10*3/uL (ref 0.0–0.1)
EOS ABS: 0 10*3/uL (ref 0.0–0.5)
EOS%: 0.5 % (ref 0.0–7.0)
HCT: 29.7 % — ABNORMAL LOW (ref 34.8–46.6)
HGB: 10.2 g/dL — ABNORMAL LOW (ref 11.6–15.9)
LYMPH%: 47.4 % (ref 14.0–49.7)
MCH: 37.1 pg — ABNORMAL HIGH (ref 25.1–34.0)
MCHC: 34.3 g/dL (ref 31.5–36.0)
MCV: 108 fL — ABNORMAL HIGH (ref 79.5–101.0)
MONO#: 0.6 10*3/uL (ref 0.1–0.9)
MONO%: 31.3 % — AB (ref 0.0–14.0)
NEUT%: 20.3 % — ABNORMAL LOW (ref 38.4–76.8)
NEUTROS ABS: 0.4 10*3/uL — AB (ref 1.5–6.5)
NRBC: 1 % — AB (ref 0–0)
Platelets: 95 10*3/uL — ABNORMAL LOW (ref 145–400)
RBC: 2.75 10*6/uL — AB (ref 3.70–5.45)
RDW: 14.4 % (ref 11.2–14.5)
WBC: 1.9 10*3/uL — AB (ref 3.9–10.3)
lymph#: 0.9 10*3/uL (ref 0.9–3.3)

## 2015-07-05 LAB — COMPREHENSIVE METABOLIC PANEL
ALK PHOS: 75 U/L (ref 40–150)
ALT: 77 U/L — AB (ref 0–55)
AST: 46 U/L — AB (ref 5–34)
Albumin: 3.7 g/dL (ref 3.5–5.0)
Anion Gap: 7 mEq/L (ref 3–11)
BUN: 8.1 mg/dL (ref 7.0–26.0)
CALCIUM: 9 mg/dL (ref 8.4–10.4)
CO2: 21 mEq/L — ABNORMAL LOW (ref 22–29)
CREATININE: 0.9 mg/dL (ref 0.6–1.1)
Chloride: 111 mEq/L — ABNORMAL HIGH (ref 98–109)
EGFR: 89 mL/min/{1.73_m2} — ABNORMAL LOW (ref >90)
GLUCOSE: 102 mg/dL (ref 70–140)
Potassium: 4.2 mEq/L (ref 3.5–5.1)
Sodium: 140 mEq/L (ref 136–145)
TOTAL PROTEIN: 7.2 g/dL (ref 6.4–8.3)
Total Bilirubin: <0.3 mg/dL (ref 0.20–1.20)

## 2015-07-05 MED ORDER — SODIUM CHLORIDE 0.9% FLUSH
10.0000 mL | INTRAVENOUS | Status: DC | PRN
  Administered 2015-07-05: 10 mL via INTRAVENOUS
  Filled 2015-07-05: qty 10

## 2015-07-05 MED ORDER — HEPARIN SOD (PORK) LOCK FLUSH 100 UNIT/ML IV SOLN
500.0000 [IU] | Freq: Once | INTRAVENOUS | Status: AC
  Administered 2015-07-05: 500 [IU] via INTRAVENOUS
  Filled 2015-07-05: qty 5

## 2015-07-05 MED ORDER — TBO-FILGRASTIM 300 MCG/0.5ML ~~LOC~~ SOSY
300.0000 ug | PREFILLED_SYRINGE | Freq: Once | SUBCUTANEOUS | Status: AC
  Administered 2015-07-05: 300 ug via SUBCUTANEOUS
  Filled 2015-07-05: qty 0.5

## 2015-07-05 NOTE — Telephone Encounter (Signed)
Gave and printed appt sched and avs for pt for March. °

## 2015-07-05 NOTE — Patient Instructions (Signed)

## 2015-07-06 ENCOUNTER — Ambulatory Visit (HOSPITAL_BASED_OUTPATIENT_CLINIC_OR_DEPARTMENT_OTHER): Payer: Medicaid Other

## 2015-07-06 VITALS — BP 125/75 | HR 112 | Temp 98.9°F

## 2015-07-06 DIAGNOSIS — C3491 Malignant neoplasm of unspecified part of right bronchus or lung: Secondary | ICD-10-CM

## 2015-07-06 DIAGNOSIS — D701 Agranulocytosis secondary to cancer chemotherapy: Secondary | ICD-10-CM | POA: Diagnosis not present

## 2015-07-06 DIAGNOSIS — T451X5A Adverse effect of antineoplastic and immunosuppressive drugs, initial encounter: Principal | ICD-10-CM

## 2015-07-06 MED ORDER — TBO-FILGRASTIM 300 MCG/0.5ML ~~LOC~~ SOSY
300.0000 ug | PREFILLED_SYRINGE | Freq: Once | SUBCUTANEOUS | Status: AC
  Administered 2015-07-06: 300 ug via SUBCUTANEOUS
  Filled 2015-07-06: qty 0.5

## 2015-07-10 ENCOUNTER — Other Ambulatory Visit: Payer: Self-pay | Admitting: Internal Medicine

## 2015-07-12 ENCOUNTER — Telehealth: Payer: Self-pay | Admitting: Internal Medicine

## 2015-07-12 ENCOUNTER — Other Ambulatory Visit (HOSPITAL_BASED_OUTPATIENT_CLINIC_OR_DEPARTMENT_OTHER): Payer: Medicaid Other

## 2015-07-12 ENCOUNTER — Encounter: Payer: Self-pay | Admitting: Internal Medicine

## 2015-07-12 ENCOUNTER — Ambulatory Visit: Payer: Medicaid Other

## 2015-07-12 ENCOUNTER — Encounter (HOSPITAL_COMMUNITY): Payer: Self-pay

## 2015-07-12 ENCOUNTER — Ambulatory Visit (HOSPITAL_COMMUNITY)
Admission: RE | Admit: 2015-07-12 | Discharge: 2015-07-12 | Disposition: A | Discharge status: Home / Self Care | Payer: Medicaid Other | Source: Ambulatory Visit | Attending: Internal Medicine | Admitting: Internal Medicine

## 2015-07-12 ENCOUNTER — Other Ambulatory Visit: Payer: Self-pay | Admitting: Pharmacist

## 2015-07-12 ENCOUNTER — Telehealth: Payer: Self-pay | Admitting: *Deleted

## 2015-07-12 ENCOUNTER — Ambulatory Visit (HOSPITAL_BASED_OUTPATIENT_CLINIC_OR_DEPARTMENT_OTHER): Payer: Medicaid Other | Admitting: Internal Medicine

## 2015-07-12 VITALS — BP 125/73 | HR 92 | Temp 97.5°F | Resp 18 | Ht 66.0 in | Wt 173.2 lb

## 2015-07-12 DIAGNOSIS — I700 Atherosclerosis of aorta: Secondary | ICD-10-CM | POA: Insufficient documentation

## 2015-07-12 DIAGNOSIS — Z95828 Presence of other vascular implants and grafts: Secondary | ICD-10-CM

## 2015-07-12 DIAGNOSIS — C3491 Malignant neoplasm of unspecified part of right bronchus or lung: Secondary | ICD-10-CM

## 2015-07-12 DIAGNOSIS — C7951 Secondary malignant neoplasm of bone: Secondary | ICD-10-CM | POA: Diagnosis not present

## 2015-07-12 DIAGNOSIS — M899 Disorder of bone, unspecified: Secondary | ICD-10-CM | POA: Insufficient documentation

## 2015-07-12 DIAGNOSIS — T451X5A Adverse effect of antineoplastic and immunosuppressive drugs, initial encounter: Principal | ICD-10-CM

## 2015-07-12 DIAGNOSIS — R599 Enlarged lymph nodes, unspecified: Secondary | ICD-10-CM | POA: Insufficient documentation

## 2015-07-12 DIAGNOSIS — R112 Nausea with vomiting, unspecified: Secondary | ICD-10-CM

## 2015-07-12 DIAGNOSIS — Z5111 Encounter for antineoplastic chemotherapy: Secondary | ICD-10-CM | POA: Insufficient documentation

## 2015-07-12 LAB — CBC WITH DIFFERENTIAL/PLATELET
BASO%: 0.3 % (ref 0.0–2.0)
Basophils Absolute: 0 10*3/uL (ref 0.0–0.1)
EOS ABS: 0 10*3/uL (ref 0.0–0.5)
EOS%: 0.6 % (ref 0.0–7.0)
HEMATOCRIT: 30 % — AB (ref 34.8–46.6)
HEMOGLOBIN: 10.3 g/dL — AB (ref 11.6–15.9)
LYMPH#: 1.3 10*3/uL (ref 0.9–3.3)
LYMPH%: 36.4 % (ref 14.0–49.7)
MCH: 37.1 pg — ABNORMAL HIGH (ref 25.1–34.0)
MCHC: 34.3 g/dL (ref 31.5–36.0)
MCV: 107.9 fL — AB (ref 79.5–101.0)
MONO#: 0.6 10*3/uL (ref 0.1–0.9)
MONO%: 16.9 % — ABNORMAL HIGH (ref 0.0–14.0)
NEUT%: 45.8 % (ref 38.4–76.8)
NEUTROS ABS: 1.6 10*3/uL (ref 1.5–6.5)
PLATELETS: 140 10*3/uL — AB (ref 145–400)
RBC: 2.78 10*6/uL — ABNORMAL LOW (ref 3.70–5.45)
RDW: 14.9 % — ABNORMAL HIGH (ref 11.2–14.5)
WBC: 3.4 10*3/uL — AB (ref 3.9–10.3)

## 2015-07-12 LAB — COMPREHENSIVE METABOLIC PANEL
ALBUMIN: 3.8 g/dL (ref 3.5–5.0)
ALK PHOS: 74 U/L (ref 40–150)
ALT: 38 U/L (ref 0–55)
ANION GAP: 6 meq/L (ref 3–11)
AST: 26 U/L (ref 5–34)
BUN: 7.4 mg/dL (ref 7.0–26.0)
CALCIUM: 9.5 mg/dL (ref 8.4–10.4)
CHLORIDE: 107 meq/L (ref 98–109)
CO2: 27 mEq/L (ref 22–29)
CREATININE: 1 mg/dL (ref 0.6–1.1)
EGFR: 77 mL/min/{1.73_m2} — ABNORMAL LOW (ref >90)
Glucose: 95 mg/dl (ref 70–140)
POTASSIUM: 4.2 meq/L (ref 3.5–5.1)
Sodium: 140 mEq/L (ref 136–145)
Total Bilirubin: 0.33 mg/dL (ref 0.20–1.20)
Total Protein: 7.3 g/dL (ref 6.4–8.3)

## 2015-07-12 MED ORDER — IOHEXOL 300 MG/ML  SOLN
100.0000 mL | Freq: Once | INTRAMUSCULAR | Status: AC | PRN
  Administered 2015-07-12: 100 mL via INTRAVENOUS

## 2015-07-12 MED ORDER — SODIUM CHLORIDE 0.9% FLUSH
10.0000 mL | INTRAVENOUS | Status: DC | PRN
  Administered 2015-07-12: 10 mL via INTRAVENOUS
  Filled 2015-07-12: qty 10

## 2015-07-12 MED ORDER — PROCHLORPERAZINE MALEATE 10 MG PO TABS: 10.0000 mg | ORAL_TABLET | Freq: Four times a day (QID) | ORAL | Status: DC | PRN

## 2015-07-12 MED ORDER — IOHEXOL 300 MG/ML  SOLN
50.0000 mL | Freq: Once | INTRAMUSCULAR | Status: AC | PRN
  Administered 2015-07-12: 50 mL via ORAL

## 2015-07-12 MED ORDER — OXYCODONE-ACETAMINOPHEN 10-325 MG PO TABS: 1.0000 | ORAL_TABLET | ORAL | Status: DC | PRN

## 2015-07-12 MED FILL — LIDOCAINE-PRILOCAINE CREAM: 2.5-2.5 | 30 days supply | Qty: 30 | Fill #2

## 2015-07-12 MED FILL — FOLIC ACID 1 MG TABLET: 1 | 30 days supply | Qty: 30 | Fill #3

## 2015-07-12 MED FILL — DEXAMETHASONE 4 MG TABLET: 4 | 20 days supply | Qty: 40 | Fill #1

## 2015-07-12 MED FILL — PROCHLORPERAZINE 10 MG TAB: 10 | 15 days supply | Qty: 60 | Fill #0

## 2015-07-12 MED FILL — OXYCODONE-APAP 10-325 TAB: 10-325 | 10 days supply | Qty: 60 | Fill #0

## 2015-07-12 NOTE — Progress Notes (Signed)
[facility] [phone]   [phone]  OFFICE PROGRESS NOTE  Andrea Nims, MD [place] [address]  DIAGNOSIS: Stage IV (T1a, N2, M1b) non-small cell lung cancer, adenocarcinoma with negative EGFR mutation diagnosed in August 2016 and presented with right hilar and subcarinal lymphadenopathy as well as large soft tissue mass in the left posterior iliac bone.  PRIOR THERAPY:  1) Status post palliative radiotherapy to the soft tissue mass in the left iliac bone under the care of Dr. Valere Dross. 2) Systemic chemotherapy with carboplatin for AUC of 5 and Alimta 500 MG/M2 every 3 weeks. First dose expected on 02/22/2015. Status post 6 cycles. Starting from cycle #3 Alimta was reduced to 400 MG/M2 secondary to neutropenia after the first 2 cycles.  CURRENT THERAPY: Maintenance systemic chemotherapy with single agent Alimta 500 MG/M2 every 3 weeks. First dose 07/19/2015.  INTERVAL HISTORY: Andrea [name] 45 y.o. female returns to the clinic today for follow-up visit. The patient is feeling fine today with no specific complaints except for persistent pain in the left iliac area and lower back. She tolerated her last dose of the chemotherapy fairly well except for chemotherapy-induced neutropenia and thrombocytopenia. She denied having any significant chest pain, shortness of breath, cough or hemoptysis. She denied having any fever or chills. She has no nausea or vomiting. She would like to increase the dose of her pain medication to Percocet 10/325 mg. The patient had repeat CT scan of the chest, abdomen and pelvis performed earlier today and she is here for evaluation and discussion of her scan results.  MEDICAL HISTORY: Past Medical History  Diagnosis Date  . Low back pain radiating to left leg 11/29/2010  . Dysmenorrhea 12/06/2009    Qualifier: Diagnosis of  By: Andrea Brooks RN, Andrea [name]    . Chronic back pain   . Family history of adverse reaction to  anesthesia     mother stopped breathing after surgery  . Anxiety   . Depression   . Headache     stress  . S/P radiation therapy 01/01/2015 through 01/21/2015      Left iliac bone 3500 cGy in 14 sessions   . Shortness of breath dyspnea     with exertion, Pt denies 02/2015  . Anginal pain ([place])     chest pain not sure what related to  . Non-small cell carcinoma of right lung, stage 4 ([place]) 01/01/2015    ALLERGIES:  is allergic to morphine and related and hydrocodone.  MEDICATIONS:  Current Outpatient Prescriptions  Medication Sig Dispense Refill  . amoxicillin (AMOXIL) 500 MG capsule Take 500 mg by mouth as directed. Reported on 06/21/2015  0  . cyanocobalamin (,VITAMIN B-12,) 1000 MCG/ML injection Inject 1,000 mcg into the muscle once.    [name] dexamethasone (DECADRON) 4 MG tablet 4 mg po bid, the day before, day of and day after chemo 40 tablet 1  . feeding supplement, ENSURE ENLIVE, (ENSURE ENLIVE) LIQD Take 237 mLs by mouth 3 (three) times daily between meals. (Patient not taking: Reported on 05/11/2015) 962 mL 12  . folic acid (FOLVITE) 1 MG tablet Take 1 tablet (1 mg total) by mouth daily. 30 tablet 4  . guaiFENesin-dextromethorphan (ROBITUSSIN DM) 100-10 MG/5ML syrup Take 5 mLs by mouth every 4 (four) hours as needed for cough.    . lidocaine-prilocaine (EMLA) cream Apply 1 teaspoon to skin 1 to 2 hours before use cover to keep in contact with skin 30  g PRN  . Multiple Vitamins-Minerals (MULTIVITAMIN & MINERAL PO) Take 2 tablets by mouth daily.     [name] oxyCODONE-acetaminophen (PERCOCET) 7.5-325 MG tablet Take 1 tablet by mouth every 6 (six) hours as needed for severe pain. 60 tablet 0  . polyethylene glycol (MIRALAX / GLYCOLAX) packet Take 17 g by mouth daily as needed for mild constipation. (Patient not taking: Reported on 04/07/2015) 14 each 0  . Prenatal Vit-Fe Fumarate-FA (PRENATAL VITAMIN PO) Take 1 tablet by mouth  daily.    . prochlorperazine (COMPAZINE) 10 MG tablet Take 1 tablet (10 mg total) by mouth every 6 (six) hours as needed for nausea or vomiting. (Patient not taking: Reported on 05/11/2015) 60 tablet 0  . senna-docusate (SENOKOT-S) 8.6-50 MG per tablet Take 2 tablets by mouth at bedtime. 30 tablet 0   No current facility-administered medications for this visit.    SURGICAL HISTORY:  Past Surgical History  Procedure Laterality Date  . Laproscopy surgery to check her tubes    . Lumbar laminectomy/decompression microdiscectomy Left 05/01/2014    Procedure: Microdiscectomy - L5-S1 - left;  Surgeon: Andrea Moore, MD;  Location: [facility];  Service: Neurosurgery;  Laterality: Left;    REVIEW OF SYSTEMS:  Constitutional: negative Eyes: negative Ears, nose, mouth, throat, and face: negative Respiratory: negative Cardiovascular: negative Gastrointestinal: negative Genitourinary:negative Integument/breast: negative Hematologic/lymphatic: negative Musculoskeletal:positive for back pain Neurological: negative Behavioral/Psych: negative Endocrine: negative Allergic/Immunologic: negative   PHYSICAL EXAMINATION: General appearance: alert, cooperative, fatigued and no distress Head: Normocephalic, without obvious abnormality, atraumatic Neck: no adenopathy, no JVD, supple, symmetrical, trachea midline and thyroid not enlarged, symmetric, no tenderness/mass/nodules Lymph nodes: Cervical, supraclavicular, and axillary nodes normal. Resp: clear to auscultation bilaterally Back: symmetric, no curvature. ROM normal. No CVA tenderness. Cardio: regular rate and rhythm, S1, S2 normal, no murmur, click, rub or gallop GI: soft, non-tender; bowel sounds normal; no masses,  no organomegaly Extremities: extremities normal, atraumatic, no cyanosis or edema Neurologic: Alert and oriented X 3, normal strength and tone. Normal symmetric reflexes. Normal coordination and gait  ECOG PERFORMANCE STATUS: 1 -  Symptomatic but completely ambulatory  Blood pressure 125/73, pulse 92, temperature 97.5 F (36.4 C), temperature source Oral, resp. rate 18, height 5' 6"  (1.676 m), weight 173 lb 3.2 oz (78.563 kg), SpO2 100 %.  LABORATORY DATA: Lab Results  Component Value Date   WBC 3.4* 07/12/2015   HGB 10.3* 07/12/2015   HCT 30.0* 07/12/2015   MCV 107.9* 07/12/2015   PLT 140* 07/12/2015      Chemistry      Component Value Date/Time   NA 140 07/12/2015 0916   NA 141 03/08/2015 1100   K 4.2 07/12/2015 0916   K 4.2 03/08/2015 1100   CL 111 03/08/2015 1100   CO2 27 07/12/2015 0916   CO2 22 03/08/2015 1100   BUN 7.4 07/12/2015 0916   BUN 8 03/08/2015 1100   CREATININE 1.0 07/12/2015 0916   CREATININE 0.77 03/08/2015 1100   CREATININE 0.85 01/08/2014 1637      Component Value Date/Time   CALCIUM 9.5 07/12/2015 0916   CALCIUM 9.5 03/08/2015 1100   ALKPHOS 74 07/12/2015 0916   ALKPHOS 74 04/20/2013 1320   AST 26 07/12/2015 0916   AST 17 04/20/2013 1320   ALT 38 07/12/2015 0916   ALT 14 04/20/2013 1320   BILITOT 0.33 07/12/2015 0916   BILITOT 0.5 04/20/2013 1320       RADIOGRAPHIC STUDIES: Ct Chest W Contrast  07/12/2015  CLINICAL DATA:  Non-small cell lung cancer diagnosed July 2016, stage IV with bony metastatic disease, last chemotherapy in February 2017. Prior pelvic radiation therapy. EXAM: CT CHEST, ABDOMEN, AND PELVIS WITH CONTRAST TECHNIQUE: Multidetector CT imaging of the chest, abdomen and pelvis was performed following the standard protocol during bolus administration of intravenous contrast. CONTRAST:  184m OMNIPAQUE IOHEXOL 300 MG/ML SOLN, 516mOMNIPAQUE IOHEXOL 300 MG/ML SOLN COMPARISON:  Multiple exams, including 04/29/2015 FINDINGS: CT CHEST FINDINGS Mediastinum/Nodes: No new or progressive adenopathy in the chest. Lungs/Pleura: Centrilobular emphysema, advanced for age. Minimal scarring in the right middle lobe. The right lower lobe nodule in the infrahilar region  measures 1.8 by 1.1 cm and is essentially stable. The long axis of this nodule was previously measured in such a fashion S2 exclude what appear to be adjacent infrahilar adenopathy but the fat infrahilar nodes are now shown to be separate and taking this into account my measurement of the long axis on the prior exam would of also been 1.8 cm. The adjacent infrahilar nodes measure up to 7 mm in short axis. Musculoskeletal: Unremarkable CT ABDOMEN PELVIS FINDINGS Hepatobiliary: 5 mm hypodense lesion in the right hepatic lobe, image 47 series 2, stable compared to 12/29/14. Gallbladder unremarkable. Pancreas: Unremarkable Spleen: Unremarkable Adrenals/Urinary Tract: Hypodense 1.1 cm lesion posteriorly in the right mid kidney, stable. Hypodense 0.9 cm lesion in the left mid kidney, stable. Hypodense 0.9 by 0.6 cm lesion in the left kidney lower pole, stable. These lesions are likely cysts but technically too small to characterize. Relatively nondistended urinary bladder. Stomach/Bowel: Unremarkable Vascular/Lymphatic: Aortoiliac atherosclerotic vascular disease. Reproductive: Unremarkable Other: No supplemental non-categorized findings. Musculoskeletal: 2.3 by 2.4 cm left posterior iliac bone metastatic site demonstrates some internal increased sclerosis compared to the prior exam where the lesion was more purely lytic. An adjacent 7 mm satellite lesion is stable. Extraosseous extension into the space between the iliac bone and sacrum is noted, with effacement of the normal fat planes in this location for example on image 94 series 2. This is stable. Endplate sclerosis and loss of disc height noted at the L5-S1 level along with suspected disc bulge or disc protrusion. IMPRESSION: 1. Stable right lower lobe central nodule with adjacent borderline prominent right infrahilar lymph nodes. No new adenopathy. 2. Mildly increased sclerosis in the dominant left iliac bone lesion, which is otherwise stable in size. The smaller  satellite lytic lesion is stable. 3. No new metastatic involvement is identified. 4.  Aortoiliac atherosclerotic vascular disease. Electronically Signed   By: WaVan Clines.D.   On: 07/12/2015 08:47   Ct Abdomen Pelvis W Contrast  07/12/2015  CLINICAL DATA:  Non-small cell lung cancer diagnosed July 2016, stage IV with bony metastatic disease, last chemotherapy in February 2017. Prior pelvic radiation therapy. EXAM: CT CHEST, ABDOMEN, AND PELVIS WITH CONTRAST TECHNIQUE: Multidetector CT imaging of the chest, abdomen and pelvis was performed following the standard protocol during bolus administration of intravenous contrast. CONTRAST:  10048mMNIPAQUE IOHEXOL 300 MG/ML SOLN, 69m77mNIPAQUE IOHEXOL 300 MG/ML SOLN COMPARISON:  Multiple exams, including 04/29/2015 FINDINGS: CT CHEST FINDINGS Mediastinum/Nodes: No new or progressive adenopathy in the chest. Lungs/Pleura: Centrilobular emphysema, advanced for age. Minimal scarring in the right middle lobe. The right lower lobe nodule in the infrahilar region measures 1.8 by 1.1 cm and is essentially stable. The long axis of this nodule was previously measured in such a fashion S2 exclude what appear to be adjacent infrahilar adenopathy but the fat infrahilar nodes are now shown to be separate  and taking this into account my measurement of the long axis on the prior exam would of also been 1.8 cm. The adjacent infrahilar nodes measure up to 7 mm in short axis. Musculoskeletal: Unremarkable CT ABDOMEN PELVIS FINDINGS Hepatobiliary: 5 mm hypodense lesion in the right hepatic lobe, image 47 series 2, stable compared to 12/29/14. Gallbladder unremarkable. Pancreas: Unremarkable Spleen: Unremarkable Adrenals/Urinary Tract: Hypodense 1.1 cm lesion posteriorly in the right mid kidney, stable. Hypodense 0.9 cm lesion in the left mid kidney, stable. Hypodense 0.9 by 0.6 cm lesion in the left kidney lower pole, stable. These lesions are likely cysts but technically too small  to characterize. Relatively nondistended urinary bladder. Stomach/Bowel: Unremarkable Vascular/Lymphatic: Aortoiliac atherosclerotic vascular disease. Reproductive: Unremarkable Other: No supplemental non-categorized findings. Musculoskeletal: 2.3 by 2.4 cm left posterior iliac bone metastatic site demonstrates some internal increased sclerosis compared to the prior exam where the lesion was more purely lytic. An adjacent 7 mm satellite lesion is stable. Extraosseous extension into the space between the iliac bone and sacrum is noted, with effacement of the normal fat planes in this location for example on image 94 series 2. This is stable. Endplate sclerosis and loss of disc height noted at the L5-S1 level along with suspected disc bulge or disc protrusion. IMPRESSION: 1. Stable right lower lobe central nodule with adjacent borderline prominent right infrahilar lymph nodes. No new adenopathy. 2. Mildly increased sclerosis in the dominant left iliac bone lesion, which is otherwise stable in size. The smaller satellite lytic lesion is stable. 3. No new metastatic involvement is identified. 4.  Aortoiliac atherosclerotic vascular disease. Electronically Signed   By: Van Clines M.D.   On: 07/12/2015 08:47    ASSESSMENT AND PLAN: This is a very pleasant 45 years old African-American female recently diagnosed with metastatic non-small cell lung cancer, adenocarcinoma with negative EGFR mutation status post palliative radiotherapy to the left iliac bone and soft tissue mass. She is currently undergoing systemic chemotherapy with carboplatin and Alimta status post 6 cycles.  She is tolerating her treatment fairly well with no specific complaints but she had chemotherapy-induced pancytopenia which improved. The recent CT scan of the chest, abdomen and pelvis showed no evidence for disease progression. I discussed the scan results and showed the images to the patient today. I recommended for her to consider  treatment with maintenance systemic chemotherapy with single agent Alimta 500 MG/M2 every 3 weeks. She is expected to start the first dose of this treatment next week on 07/19/2015. For pain management, I would increase her dose of Percocet to 10/325 mg by mouth every 6 hours as needed for pain. For the metastatic bone disease, I would consider the patient for treatment with Xgeva next week on 07/19/2015. The patient would come back for follow-up visit in 4 weeks with the start of cycle #2. She was advised to call immediately if she has any concerning symptoms in the interval. The patient voices understanding of current disease status and treatment options and is in agreement with the current care plan.  All questions were answered. The patient knows to call the clinic with any problems, questions or concerns. We can certainly see the patient much sooner if necessary.  Disclaimer: This note was dictated with voice recognition software. Similar sounding words can inadvertently be transcribed and may not be corrected upon review.

## 2015-07-12 NOTE — Progress Notes (Signed)
Pt treatment cancelled per Dr. Julien Nordmann.

## 2015-07-12 NOTE — Telephone Encounter (Signed)
Scheduled patient appt per pof, not able to printed avs report.

## 2015-07-12 NOTE — Telephone Encounter (Signed)
Per staff message and POF I have scheduled appts. Advised scheduler of appts and to move lab apapts . JMW  

## 2015-07-12 NOTE — Patient Instructions (Signed)

## 2015-07-19 ENCOUNTER — Other Ambulatory Visit: Payer: Self-pay

## 2015-07-19 ENCOUNTER — Ambulatory Visit: Payer: Medicaid Other

## 2015-07-19 ENCOUNTER — Other Ambulatory Visit (HOSPITAL_BASED_OUTPATIENT_CLINIC_OR_DEPARTMENT_OTHER): Payer: Medicaid Other

## 2015-07-19 ENCOUNTER — Ambulatory Visit (HOSPITAL_BASED_OUTPATIENT_CLINIC_OR_DEPARTMENT_OTHER): Payer: Medicaid Other

## 2015-07-19 VITALS — BP 154/65 | HR 69 | Temp 98.1°F | Resp 18

## 2015-07-19 DIAGNOSIS — C3491 Malignant neoplasm of unspecified part of right bronchus or lung: Secondary | ICD-10-CM

## 2015-07-19 DIAGNOSIS — M898X9 Other specified disorders of bone, unspecified site: Secondary | ICD-10-CM

## 2015-07-19 DIAGNOSIS — Z5111 Encounter for antineoplastic chemotherapy: Secondary | ICD-10-CM | POA: Diagnosis not present

## 2015-07-19 DIAGNOSIS — C7951 Secondary malignant neoplasm of bone: Secondary | ICD-10-CM | POA: Diagnosis not present

## 2015-07-19 DIAGNOSIS — Z95828 Presence of other vascular implants and grafts: Secondary | ICD-10-CM

## 2015-07-19 LAB — COMPREHENSIVE METABOLIC PANEL
ALT: 25 U/L (ref 0–55)
ANION GAP: 10 meq/L (ref 3–11)
AST: 28 U/L (ref 5–34)
Albumin: 3.8 g/dL (ref 3.5–5.0)
Alkaline Phosphatase: 67 U/L (ref 40–150)
BUN: 6.2 mg/dL — AB (ref 7.0–26.0)
CALCIUM: 9.4 mg/dL (ref 8.4–10.4)
CHLORIDE: 111 meq/L — AB (ref 98–109)
CO2: 21 meq/L — AB (ref 22–29)
Creatinine: 1.1 mg/dL (ref 0.6–1.1)
EGFR: 75 mL/min/{1.73_m2} — ABNORMAL LOW (ref >90)
Glucose: 133 mg/dl (ref 70–140)
POTASSIUM: 3.8 meq/L (ref 3.5–5.1)
Sodium: 141 mEq/L (ref 136–145)
Total Bilirubin: 0.37 mg/dL (ref 0.20–1.20)
Total Protein: 7.6 g/dL (ref 6.4–8.3)

## 2015-07-19 LAB — CBC WITH DIFFERENTIAL/PLATELET
BASO%: 0.3 % (ref 0.0–2.0)
BASOS ABS: 0 10*3/uL (ref 0.0–0.1)
EOS%: 0.6 % (ref 0.0–7.0)
Eosinophils Absolute: 0 10*3/uL (ref 0.0–0.5)
HEMATOCRIT: 32.3 % — AB (ref 34.8–46.6)
HGB: 10.9 g/dL — ABNORMAL LOW (ref 11.6–15.9)
LYMPH#: 1.1 10*3/uL (ref 0.9–3.3)
LYMPH%: 39.3 % (ref 14.0–49.7)
MCH: 37.6 pg — AB (ref 25.1–34.0)
MCHC: 33.8 g/dL (ref 31.5–36.0)
MCV: 111.1 fL — ABNORMAL HIGH (ref 79.5–101.0)
MONO#: 0.3 10*3/uL (ref 0.1–0.9)
MONO%: 12.5 % (ref 0.0–14.0)
NEUT#: 1.3 10*3/uL — ABNORMAL LOW (ref 1.5–6.5)
NEUT%: 47.3 % (ref 38.4–76.8)
Platelets: 225 10*3/uL (ref 145–400)
RBC: 2.91 10*6/uL — AB (ref 3.70–5.45)
RDW: 15.7 % — ABNORMAL HIGH (ref 11.2–14.5)
WBC: 2.8 10*3/uL — ABNORMAL LOW (ref 3.9–10.3)

## 2015-07-19 MED ORDER — SODIUM CHLORIDE 0.9 % IV SOLN
Freq: Once | INTRAVENOUS | Status: AC
  Administered 2015-07-19: 11:00:00 via INTRAVENOUS

## 2015-07-19 MED ORDER — DENOSUMAB 120 MG/1.7ML ~~LOC~~ SOLN: 120.0000 mg | Freq: Once | SUBCUTANEOUS | Status: DC

## 2015-07-19 MED ORDER — SODIUM CHLORIDE 0.9% FLUSH
10.0000 mL | INTRAVENOUS | Status: DC | PRN
  Administered 2015-07-19: 10 mL via INTRAVENOUS
  Filled 2015-07-19: qty 10

## 2015-07-19 MED ORDER — HEPARIN SOD (PORK) LOCK FLUSH 100 UNIT/ML IV SOLN
500.0000 [IU] | Freq: Once | INTRAVENOUS | Status: AC | PRN
  Administered 2015-07-19: 500 [IU]
  Filled 2015-07-19: qty 5

## 2015-07-19 MED ORDER — PROCHLORPERAZINE MALEATE 10 MG PO TABS
10.0000 mg | ORAL_TABLET | Freq: Once | ORAL | Status: AC
  Administered 2015-07-19: 10 mg via ORAL

## 2015-07-19 MED ORDER — SODIUM CHLORIDE 0.9 % IV SOLN
500.0000 mg/m2 | Freq: Once | INTRAVENOUS | Status: AC
  Administered 2015-07-19: 950 mg via INTRAVENOUS
  Filled 2015-07-19: qty 32

## 2015-07-19 MED ORDER — SODIUM CHLORIDE 0.9% FLUSH
10.0000 mL | INTRAVENOUS | Status: DC | PRN
  Administered 2015-07-19: 10 mL
  Filled 2015-07-19: qty 10

## 2015-07-19 MED ORDER — PROCHLORPERAZINE MALEATE 10 MG PO TABS
ORAL_TABLET | ORAL | Status: AC
  Filled 2015-07-19: qty 1

## 2015-07-19 NOTE — Progress Notes (Signed)
Dr. Julien Nordmann, okay to tx with ANC 1.3 w/o dose-reduction (patient only receiving alimta, no carbo).

## 2015-07-19 NOTE — Progress Notes (Signed)
Xgeva injection held by pharmacy due to recent dental procedure.

## 2015-07-19 NOTE — Patient Instructions (Signed)

## 2015-07-19 NOTE — Patient Instructions (Addendum)
[facility] Discharge Instructions for Patients Receiving Chemotherapy  Today you received the following chemotherapy agents: Alimta.  To help prevent nausea and vomiting after your treatment, we encourage you to take your nausea medication: Compazine. Take one every 6 hours as needed.   If you develop nausea and vomiting that is not controlled by your nausea medication, call the clinic.   BELOW ARE SYMPTOMS THAT SHOULD BE REPORTED IMMEDIATELY:  *FEVER GREATER THAN 100.5 F  *CHILLS WITH OR WITHOUT FEVER  NAUSEA AND VOMITING THAT IS NOT CONTROLLED WITH YOUR NAUSEA MEDICATION  *UNUSUAL SHORTNESS OF BREATH  *UNUSUAL BRUISING OR BLEEDING  TENDERNESS IN MOUTH AND THROAT WITH OR WITHOUT PRESENCE OF ULCERS  *URINARY PROBLEMS  *BOWEL PROBLEMS  UNUSUAL RASH Items with * indicate a potential emergency and should be followed up as soon as possible.  Feel free to call the clinic should you have any questions or concerns. The clinic phone number is [phone].  Please show the [facility] at check-in to the Emergency Department and triage nurse.  Denosumab injection What is this medicine? DENOSUMAB (den oh sue mab) slows bone breakdown. Prolia is used to treat osteoporosis in women after menopause and in men. Delton See is used to prevent bone fractures and other bone problems caused by cancer bone metastases. Delton See is also used to treat giant cell tumor of the bone. This medicine may be used for other purposes; ask your health care provider or pharmacist if you have questions. What should I tell my health care provider before I take this medicine? They need to know if you have any of these conditions: -dental disease -eczema -infection or history of infections -kidney disease or on dialysis -low blood calcium or vitamin D -malabsorption syndrome -scheduled to have surgery or tooth extraction -taking medicine that contains denosumab -thyroid or parathyroid  disease -an unusual reaction to denosumab, other medicines, foods, dyes, or preservatives -pregnant or trying to get pregnant -breast-feeding How should I use this medicine? This medicine is for injection under the skin. It is given by a health care professional in a hospital or clinic setting. If you are getting Prolia, a special MedGuide will be given to you by the pharmacist with each prescription and refill. Be sure to read this information carefully each time. For Prolia, talk to your pediatrician regarding the use of this medicine in children. Special care may be needed. For Delton See, talk to your pediatrician regarding the use of this medicine in children. While this drug may be prescribed for children as young as 13 years for selected conditions, precautions do apply. Overdosage: If you think you have taken too much of this medicine contact a poison control center or emergency room at once. NOTE: This medicine is only for you. Do not share this medicine with others. What if I miss a dose? It is important not to miss your dose. Call your doctor or health care professional if you are unable to keep an appointment. What may interact with this medicine? Do not take this medicine with any of the following medications: -other medicines containing denosumab This medicine may also interact with the following medications: -medicines that suppress the immune system -medicines that treat cancer -steroid medicines like prednisone or cortisone This list may not describe all possible interactions. Give your health care provider a list of all the medicines, herbs, non-prescription drugs, or dietary supplements you use. Also tell them if you smoke, drink alcohol, or use illegal drugs. Some items may  interact with your medicine. What should I watch for while using this medicine? Visit your doctor or health care professional for regular checks on your progress. Your doctor or health care professional may  order blood tests and other tests to see how you are doing. Call your doctor or health care professional if you get a cold or other infection while receiving this medicine. Do not treat yourself. This medicine may decrease your body's ability to fight infection. You should make sure you get enough calcium and vitamin D while you are taking this medicine, unless your doctor tells you not to. Discuss the foods you eat and the vitamins you take with your health care professional. See your dentist regularly. Brush and floss your teeth as directed. Before you have any dental work done, tell your dentist you are receiving this medicine. Do not become pregnant while taking this medicine or for 5 months after stopping it. Women should inform their doctor if they wish to become pregnant or think they might be pregnant. There is a potential for serious side effects to an unborn child. Talk to your health care professional or pharmacist for more information. What side effects may I notice from receiving this medicine? Side effects that you should report to your doctor or health care professional as soon as possible: -allergic reactions like skin rash, itching or hives, swelling of the face, lips, or tongue -breathing problems -chest pain -fast, irregular heartbeat -feeling faint or lightheaded, falls -fever, chills, or any other sign of infection -muscle spasms, tightening, or twitches -numbness or tingling -skin blisters or bumps, or is dry, peels, or red -slow healing or unexplained pain in the mouth or jaw -unusual bleeding or bruising Side effects that usually do not require medical attention (Report these to your doctor or health care professional if they continue or are bothersome.): -muscle pain -stomach upset, gas This list may not describe all possible side effects. Call your doctor for medical advice about side effects. You may report side effects to FDA at 1-800-FDA-1088. Where should I keep my  medicine? This medicine is only given in a clinic, doctor's office, or other health care setting and will not be stored at home. NOTE: This sheet is a summary. It may not cover all possible information. If you have questions about this medicine, talk to your doctor, pharmacist, or health care provider.    2016, Elsevier/Gold Standard. (2011-10-09 12:37:47)

## 2015-08-11 ENCOUNTER — Encounter: Payer: Self-pay | Admitting: Internal Medicine

## 2015-08-11 ENCOUNTER — Ambulatory Visit (HOSPITAL_BASED_OUTPATIENT_CLINIC_OR_DEPARTMENT_OTHER): Payer: Medicaid Other

## 2015-08-11 ENCOUNTER — Telehealth: Payer: Self-pay | Admitting: *Deleted

## 2015-08-11 ENCOUNTER — Ambulatory Visit (HOSPITAL_BASED_OUTPATIENT_CLINIC_OR_DEPARTMENT_OTHER): Payer: Medicaid Other | Admitting: Internal Medicine

## 2015-08-11 ENCOUNTER — Telehealth: Payer: Self-pay | Admitting: Internal Medicine

## 2015-08-11 ENCOUNTER — Other Ambulatory Visit (HOSPITAL_BASED_OUTPATIENT_CLINIC_OR_DEPARTMENT_OTHER): Payer: Medicaid Other

## 2015-08-11 ENCOUNTER — Ambulatory Visit: Payer: Medicaid Other

## 2015-08-11 ENCOUNTER — Other Ambulatory Visit: Payer: Self-pay | Admitting: *Deleted

## 2015-08-11 VITALS — BP 148/98 | HR 93 | Temp 97.3°F | Resp 18 | Ht 66.0 in | Wt 178.5 lb

## 2015-08-11 DIAGNOSIS — C7951 Secondary malignant neoplasm of bone: Secondary | ICD-10-CM | POA: Diagnosis not present

## 2015-08-11 DIAGNOSIS — Z5111 Encounter for antineoplastic chemotherapy: Secondary | ICD-10-CM

## 2015-08-11 DIAGNOSIS — C3491 Malignant neoplasm of unspecified part of right bronchus or lung: Secondary | ICD-10-CM

## 2015-08-11 DIAGNOSIS — Z95828 Presence of other vascular implants and grafts: Secondary | ICD-10-CM

## 2015-08-11 LAB — CBC WITH DIFFERENTIAL/PLATELET
BASO%: 0 % (ref 0.0–2.0)
BASOS ABS: 0 10*3/uL (ref 0.0–0.1)
EOS ABS: 0 10*3/uL (ref 0.0–0.5)
EOS%: 0 % (ref 0.0–7.0)
HEMATOCRIT: 36.4 % (ref 34.8–46.6)
HEMOGLOBIN: 12.3 g/dL (ref 11.6–15.9)
LYMPH#: 0.6 10*3/uL — AB (ref 0.9–3.3)
LYMPH%: 15.7 % (ref 14.0–49.7)
MCH: 36.8 pg — ABNORMAL HIGH (ref 25.1–34.0)
MCHC: 33.8 g/dL (ref 31.5–36.0)
MCV: 109 fL — AB (ref 79.5–101.0)
MONO#: 0.1 10*3/uL (ref 0.1–0.9)
MONO%: 1.2 % (ref 0.0–14.0)
NEUT%: 83.1 % — ABNORMAL HIGH (ref 38.4–76.8)
NEUTROS ABS: 3.4 10*3/uL (ref 1.5–6.5)
PLATELETS: 304 10*3/uL (ref 145–400)
RBC: 3.34 10*6/uL — ABNORMAL LOW (ref 3.70–5.45)
RDW: 14.1 % (ref 11.2–14.5)
WBC: 4.1 10*3/uL (ref 3.9–10.3)

## 2015-08-11 LAB — COMPREHENSIVE METABOLIC PANEL
ALBUMIN: 4 g/dL (ref 3.5–5.0)
ALT: 44 U/L (ref 0–55)
AST: 30 U/L (ref 5–34)
Alkaline Phosphatase: 75 U/L (ref 40–150)
Anion Gap: 13 mEq/L — ABNORMAL HIGH (ref 3–11)
BUN: 8.2 mg/dL (ref 7.0–26.0)
CALCIUM: 10.2 mg/dL (ref 8.4–10.4)
CHLORIDE: 107 meq/L (ref 98–109)
CO2: 17 meq/L — AB (ref 22–29)
CREATININE: 1.2 mg/dL — AB (ref 0.6–1.1)
EGFR: 67 mL/min/{1.73_m2} — ABNORMAL LOW (ref >90)
Glucose: 218 mg/dl — ABNORMAL HIGH (ref 70–140)
POTASSIUM: 3.8 meq/L (ref 3.5–5.1)
SODIUM: 138 meq/L (ref 136–145)
TOTAL PROTEIN: 8.3 g/dL (ref 6.4–8.3)
Total Bilirubin: 0.37 mg/dL (ref 0.20–1.20)

## 2015-08-11 MED ORDER — SODIUM CHLORIDE 0.9 % IV SOLN
Freq: Once | INTRAVENOUS | Status: AC
  Administered 2015-08-11: 12:00:00 via INTRAVENOUS

## 2015-08-11 MED ORDER — SODIUM CHLORIDE 0.9% FLUSH
10.0000 mL | INTRAVENOUS | Status: DC | PRN
  Administered 2015-08-11: 10 mL
  Filled 2015-08-11: qty 10

## 2015-08-11 MED ORDER — PROCHLORPERAZINE MALEATE 10 MG PO TABS
10.0000 mg | ORAL_TABLET | Freq: Once | ORAL | Status: AC
  Administered 2015-08-11: 10 mg via ORAL

## 2015-08-11 MED ORDER — HEPARIN SOD (PORK) LOCK FLUSH 100 UNIT/ML IV SOLN
500.0000 [IU] | Freq: Once | INTRAVENOUS | Status: AC | PRN
  Administered 2015-08-11: 500 [IU]
  Filled 2015-08-11: qty 5

## 2015-08-11 MED ORDER — SODIUM CHLORIDE 0.9 % IV SOLN
500.0000 mg/m2 | Freq: Once | INTRAVENOUS | Status: AC
  Administered 2015-08-11: 950 mg via INTRAVENOUS
  Filled 2015-08-11: qty 38

## 2015-08-11 MED ORDER — SODIUM CHLORIDE 0.9% FLUSH
10.0000 mL | INTRAVENOUS | Status: DC | PRN
  Administered 2015-08-11: 10 mL via INTRAVENOUS
  Filled 2015-08-11: qty 10

## 2015-08-11 MED ORDER — OXYCODONE-ACETAMINOPHEN 10-325 MG PO TABS: 1.0000 | ORAL_TABLET | ORAL | Status: DC | PRN

## 2015-08-11 MED ORDER — PROCHLORPERAZINE MALEATE 10 MG PO TABS
ORAL_TABLET | ORAL | Status: AC
  Filled 2015-08-11: qty 1

## 2015-08-11 MED FILL — OXYCODONE-APAP 10-325 TAB: 10-325 | 10 days supply | Qty: 60 | Fill #0

## 2015-08-11 NOTE — Patient Instructions (Signed)
[facility] Discharge Instructions for Patients Receiving Chemotherapy  Today you received the following chemotherapy agents Alimta.  To help prevent nausea and vomiting after your treatment, we encourage you to take your nausea medication as prescribed by your physician. If you develop nausea and vomiting that is not controlled by your nausea medication, call the clinic.   BELOW ARE SYMPTOMS THAT SHOULD BE REPORTED IMMEDIATELY:  *FEVER GREATER THAN 100.5 F  *CHILLS WITH OR WITHOUT FEVER  NAUSEA AND VOMITING THAT IS NOT CONTROLLED WITH YOUR NAUSEA MEDICATION  *UNUSUAL SHORTNESS OF BREATH  *UNUSUAL BRUISING OR BLEEDING  TENDERNESS IN MOUTH AND THROAT WITH OR WITHOUT PRESENCE OF ULCERS  *URINARY PROBLEMS  *BOWEL PROBLEMS  UNUSUAL RASH Items with * indicate a potential emergency and should be followed up as soon as possible.  Feel free to call the clinic you have any questions or concerns. The clinic phone number is [phone].  Please show the CHEMO ALERT CARD at check-in to the Emergency Department and triage nurse.   

## 2015-08-11 NOTE — Progress Notes (Signed)
[facility] [phone]   [phone]  OFFICE PROGRESS NOTE  [name], MD [place] [address]  DIAGNOSIS: Stage IV (T1a, N2, M1b) non-small cell lung cancer, adenocarcinoma with negative EGFR mutation diagnosed in August 2016 and presented with right hilar and subcarinal lymphadenopathy as well as large soft tissue mass in the left posterior iliac bone.  PRIOR THERAPY:  1) Status post palliative radiotherapy to the soft tissue mass in the left iliac bone under the care of Dr. Valere Dross. 2) Systemic chemotherapy with carboplatin for AUC of 5 and Alimta 500 MG/M2 every 3 weeks. First dose expected on 02/22/2015. Status post 6 cycles. Starting from cycle #3 Alimta was reduced to 400 MG/M2 secondary to neutropenia after the first 2 cycles.  CURRENT THERAPY: Maintenance systemic chemotherapy with single agent Alimta 500 MG/M2 every 3 weeks. First dose 07/19/2015. Status post one cycle.  INTERVAL HISTORY: Andrea [name] 45 y.o. female returns to the clinic today for follow-up visit. The patient is feeling fine today with no specific complaints except for persistent pain in the left iliac area and lower back. She is requesting refill of her pain medication. She tolerated the first dose of maintenance chemotherapy with single agent Alimta fairly well except for mild nausea for a few days after the treatment. She denied having any significant chest pain, shortness of breath, cough or hemoptysis. She denied having any fever or chills. She has no nausea or vomiting.  She is here today to start cycle #2 of her treatment.  MEDICAL HISTORY: Past Medical History  Diagnosis Date  . Low back pain radiating to left leg 11/29/2010  . Dysmenorrhea 12/06/2009    Qualifier: Diagnosis of  By: [name] RN, Regino Schultze    . Chronic back pain   . Family history of adverse reaction to anesthesia     mother stopped breathing after surgery  . Anxiety   . Depression     . Headache     stress  . S/P radiation therapy 01/01/2015 through 01/21/2015      Left iliac bone 3500 cGy in 14 sessions   . Shortness of breath dyspnea     with exertion, Pt denies 02/2015  . Anginal pain ([facility])     chest pain not sure what related to  . Non-small cell carcinoma of right lung, stage 4 ([facility]) 01/01/2015    ALLERGIES:  is allergic to morphine and related and hydrocodone.  MEDICATIONS:  Current Outpatient Prescriptions  Medication Sig Dispense Refill  . amoxicillin (AMOXIL) 500 MG capsule Take 500 mg by mouth as directed. Reported on 06/21/2015  0  . cyanocobalamin (,VITAMIN B-12,) 1000 MCG/ML injection Inject 1,000 mcg into the muscle once.    [name] dexamethasone (DECADRON) 4 MG tablet 4 mg po bid, the day before, day of and day after chemo 40 tablet 1  . feeding supplement, ENSURE ENLIVE, (ENSURE ENLIVE) LIQD Take 237 mLs by mouth 3 (three) times daily between meals. (Patient not taking: Reported on 05/11/2015) 709 mL 12  . folic acid (FOLVITE) 1 MG tablet Take 1 tablet (1 mg total) by mouth daily. 30 tablet 4  . guaiFENesin-dextromethorphan (ROBITUSSIN DM) 100-10 MG/5ML syrup Take 5 mLs by mouth every 4 (four) hours as needed for cough.    . lidocaine-prilocaine (EMLA) cream Apply 1 teaspoon to skin 1 to 2 hours before use cover to keep in contact with skin 30 g PRN  . Multiple Vitamins-Minerals (MULTIVITAMIN &  MINERAL PO) Take 2 tablets by mouth daily.     [name] oxyCODONE-acetaminophen (PERCOCET) 10-325 MG tablet Take 1 tablet by mouth every 4 (four) hours as needed for pain. 60 tablet 0  . polyethylene glycol (MIRALAX / GLYCOLAX) packet Take 17 g by mouth daily as needed for mild constipation. (Patient not taking: Reported on 04/07/2015) 14 each 0  . Prenatal Vit-Fe Fumarate-FA (PRENATAL VITAMIN PO) Take 1 tablet by mouth daily.    . prochlorperazine (COMPAZINE) 10 MG tablet Take 1 tablet (10 mg total) by mouth  every 6 (six) hours as needed for nausea or vomiting. 60 tablet 0  . senna-docusate (SENOKOT-S) 8.6-50 MG per tablet Take 2 tablets by mouth at bedtime. 30 tablet 0   No current facility-administered medications for this visit.    SURGICAL HISTORY:  Past Surgical History  Procedure Laterality Date  . Laproscopy surgery to check her tubes    . Lumbar laminectomy/decompression microdiscectomy Left 05/01/2014    Procedure: Microdiscectomy - L5-S1 - left;  Surgeon: Eustace Moore, MD;  Location: [facility];  Service: Neurosurgery;  Laterality: Left;    REVIEW OF SYSTEMS:  A comprehensive review of systems was negative except for: Constitutional: positive for fatigue Gastrointestinal: positive for nausea Musculoskeletal: positive for back pain   PHYSICAL EXAMINATION: General appearance: alert, cooperative, fatigued and no distress Head: Normocephalic, without obvious abnormality, atraumatic Neck: no adenopathy, no JVD, supple, symmetrical, trachea midline and thyroid not enlarged, symmetric, no tenderness/mass/nodules Lymph nodes: Cervical, supraclavicular, and axillary nodes normal. Resp: clear to auscultation bilaterally Back: symmetric, no curvature. ROM normal. No CVA tenderness. Cardio: regular rate and rhythm, S1, S2 normal, no murmur, click, rub or gallop GI: soft, non-tender; bowel sounds normal; no masses,  no organomegaly Extremities: extremities normal, atraumatic, no cyanosis or edema Neurologic: Alert and oriented X 3, normal strength and tone. Normal symmetric reflexes. Normal coordination and gait  ECOG PERFORMANCE STATUS: 1 - Symptomatic but completely ambulatory  Blood pressure 148/98, pulse 93, temperature 97.3 F (36.3 C), temperature source Oral, resp. rate 18, height 5' 6"  (1.676 m), weight 178 lb 8 oz (80.967 kg), SpO2 100 %.  LABORATORY DATA: Lab Results  Component Value Date   WBC 4.1 08/11/2015   HGB 12.3 08/11/2015   HCT 36.4 08/11/2015   MCV 109.0* 08/11/2015    PLT 304 08/11/2015      Chemistry      Component Value Date/Time   NA 141 07/19/2015 0944   NA 141 03/08/2015 1100   K 3.8 07/19/2015 0944   K 4.2 03/08/2015 1100   CL 111 03/08/2015 1100   CO2 21* 07/19/2015 0944   CO2 22 03/08/2015 1100   BUN 6.2* 07/19/2015 0944   BUN 8 03/08/2015 1100   CREATININE 1.1 07/19/2015 0944   CREATININE 0.77 03/08/2015 1100   CREATININE 0.85 01/08/2014 1637      Component Value Date/Time   CALCIUM 9.4 07/19/2015 0944   CALCIUM 9.5 03/08/2015 1100   ALKPHOS 67 07/19/2015 0944   ALKPHOS 74 04/20/2013 1320   AST 28 07/19/2015 0944   AST 17 04/20/2013 1320   ALT 25 07/19/2015 0944   ALT 14 04/20/2013 1320   BILITOT 0.37 07/19/2015 0944   BILITOT 0.5 04/20/2013 1320       RADIOGRAPHIC STUDIES: No results found.  ASSESSMENT AND PLAN: This is a very pleasant 45 years old African-American female recently diagnosed with metastatic non-small cell lung cancer, adenocarcinoma with negative EGFR mutation status post palliative radiotherapy to the left  iliac bone and soft tissue mass. She is currently undergoing systemic chemotherapy with carboplatin and Alimta status post 6 cycles.  She is currently undergoing maintenance chemotherapy with single agent Alimta status post 1 cycle. She tolerated the first cycle well except for mild nausea. I recommended for the patient to proceed with cycle #2 today as scheduled. For pain management, she will continue on Percocet to 10/325 mg by mouth every 6 hours as needed for pain. She was given a refill of her medication. For the metastatic bone disease, I would consider the patient for treatment with Xgeva next week on 07/19/2015. The patient would come back for follow-up visit in 3 weeks with the start of cycle #3. She was advised to call immediately if she has any concerning symptoms in the interval. The patient voices understanding of current disease status and treatment options and is in agreement with the current  care plan.  All questions were answered. The patient knows to call the clinic with any problems, questions or concerns. We can certainly see the patient much sooner if necessary.  Disclaimer: This note was dictated with voice recognition software. Similar sounding words can inadvertently be transcribed and may not be corrected upon review.

## 2015-08-11 NOTE — Telephone Encounter (Signed)
Per staff message and POF I have scheduled appts. Advised scheduler of appts. JMW  

## 2015-08-11 NOTE — Telephone Encounter (Signed)
per pof to sch pt appt-sent MW email tosch trmt-pt to get updated copy b4 leaving

## 2015-08-11 NOTE — Addendum Note (Signed)
Addended by: Lucile Crater on: 08/11/2015 12:33 PM   Modules accepted: Orders, Medications

## 2015-08-30 ENCOUNTER — Other Ambulatory Visit: Payer: Self-pay | Admitting: *Deleted

## 2015-08-30 DIAGNOSIS — C3491 Malignant neoplasm of unspecified part of right bronchus or lung: Secondary | ICD-10-CM

## 2015-08-31 ENCOUNTER — Ambulatory Visit (HOSPITAL_BASED_OUTPATIENT_CLINIC_OR_DEPARTMENT_OTHER): Payer: Medicaid Other

## 2015-08-31 ENCOUNTER — Other Ambulatory Visit (HOSPITAL_BASED_OUTPATIENT_CLINIC_OR_DEPARTMENT_OTHER): Payer: Medicaid Other

## 2015-08-31 ENCOUNTER — Ambulatory Visit: Payer: Medicaid Other

## 2015-08-31 ENCOUNTER — Ambulatory Visit (HOSPITAL_BASED_OUTPATIENT_CLINIC_OR_DEPARTMENT_OTHER): Payer: Medicaid Other | Admitting: Internal Medicine

## 2015-08-31 ENCOUNTER — Encounter: Payer: Self-pay | Admitting: Internal Medicine

## 2015-08-31 ENCOUNTER — Other Ambulatory Visit: Payer: Self-pay

## 2015-08-31 ENCOUNTER — Telehealth: Payer: Self-pay | Admitting: Internal Medicine

## 2015-08-31 VITALS — BP 133/86 | HR 98 | Temp 98.0°F | Resp 20 | Ht 66.0 in | Wt 177.0 lb

## 2015-08-31 DIAGNOSIS — C7951 Secondary malignant neoplasm of bone: Secondary | ICD-10-CM

## 2015-08-31 DIAGNOSIS — C3491 Malignant neoplasm of unspecified part of right bronchus or lung: Secondary | ICD-10-CM

## 2015-08-31 DIAGNOSIS — R110 Nausea: Secondary | ICD-10-CM

## 2015-08-31 DIAGNOSIS — Z5111 Encounter for antineoplastic chemotherapy: Secondary | ICD-10-CM

## 2015-08-31 DIAGNOSIS — Z95828 Presence of other vascular implants and grafts: Secondary | ICD-10-CM | POA: Insufficient documentation

## 2015-08-31 DIAGNOSIS — M898X9 Other specified disorders of bone, unspecified site: Secondary | ICD-10-CM

## 2015-08-31 LAB — COMPREHENSIVE METABOLIC PANEL
ALT: 32 U/L (ref 0–55)
ANION GAP: 11 meq/L (ref 3–11)
AST: 23 U/L (ref 5–34)
Albumin: 3.9 g/dL (ref 3.5–5.0)
Alkaline Phosphatase: 84 U/L (ref 40–150)
BUN: 7.3 mg/dL (ref 7.0–26.0)
CALCIUM: 10.3 mg/dL (ref 8.4–10.4)
CHLORIDE: 109 meq/L (ref 98–109)
CO2: 20 meq/L — AB (ref 22–29)
Creatinine: 1 mg/dL (ref 0.6–1.1)
EGFR: 79 mL/min/{1.73_m2} — ABNORMAL LOW (ref >90)
Glucose: 150 mg/dl — ABNORMAL HIGH (ref 70–140)
POTASSIUM: 3.8 meq/L (ref 3.5–5.1)
Sodium: 140 mEq/L (ref 136–145)
Total Bilirubin: <0.3 mg/dL (ref 0.20–1.20)
Total Protein: 8.2 g/dL (ref 6.4–8.3)

## 2015-08-31 LAB — CBC WITH DIFFERENTIAL/PLATELET
BASO%: 0.7 % (ref 0.0–2.0)
BASOS ABS: 0.1 10*3/uL (ref 0.0–0.1)
EOS%: 0.1 % (ref 0.0–7.0)
Eosinophils Absolute: 0 10*3/uL (ref 0.0–0.5)
HEMATOCRIT: 35.9 % (ref 34.8–46.6)
HGB: 12.2 g/dL (ref 11.6–15.9)
LYMPH#: 1 10*3/uL (ref 0.9–3.3)
LYMPH%: 12.9 % — AB (ref 14.0–49.7)
MCH: 36.6 pg — AB (ref 25.1–34.0)
MCHC: 33.9 g/dL (ref 31.5–36.0)
MCV: 107.9 fL — ABNORMAL HIGH (ref 79.5–101.0)
MONO#: 0.8 10*3/uL (ref 0.1–0.9)
MONO%: 10.4 % (ref 0.0–14.0)
NEUT#: 5.7 10*3/uL (ref 1.5–6.5)
NEUT%: 75.9 % (ref 38.4–76.8)
Platelets: 343 10*3/uL (ref 145–400)
RBC: 3.32 10*6/uL — AB (ref 3.70–5.45)
RDW: 14.2 % (ref 11.2–14.5)
WBC: 7.5 10*3/uL (ref 3.9–10.3)

## 2015-08-31 MED ORDER — SODIUM CHLORIDE 0.9 % IJ SOLN
10.0000 mL | INTRAMUSCULAR | Status: DC | PRN
  Administered 2015-08-31: 10 mL via INTRAVENOUS
  Filled 2015-08-31: qty 10

## 2015-08-31 MED ORDER — HEPARIN SOD (PORK) LOCK FLUSH 100 UNIT/ML IV SOLN
500.0000 [IU] | Freq: Once | INTRAVENOUS | Status: AC | PRN
  Administered 2015-08-31: 500 [IU]
  Filled 2015-08-31: qty 5

## 2015-08-31 MED ORDER — SODIUM CHLORIDE 0.9 % IV SOLN
500.0000 mg/m2 | Freq: Once | INTRAVENOUS | Status: AC
  Administered 2015-08-31: 950 mg via INTRAVENOUS
  Filled 2015-08-31: qty 38

## 2015-08-31 MED ORDER — CYANOCOBALAMIN 1000 MCG/ML IJ SOLN
INTRAMUSCULAR | Status: AC
  Filled 2015-08-31: qty 1

## 2015-08-31 MED ORDER — SODIUM CHLORIDE 0.9 % IV SOLN
Freq: Once | INTRAVENOUS | Status: AC
  Administered 2015-08-31: 10:00:00 via INTRAVENOUS

## 2015-08-31 MED ORDER — PROCHLORPERAZINE MALEATE 10 MG PO TABS
ORAL_TABLET | ORAL | Status: AC
  Filled 2015-08-31: qty 1

## 2015-08-31 MED ORDER — PROCHLORPERAZINE MALEATE 10 MG PO TABS
10.0000 mg | ORAL_TABLET | Freq: Once | ORAL | Status: AC
  Administered 2015-08-31: 10 mg via ORAL

## 2015-08-31 MED ORDER — CYANOCOBALAMIN 1000 MCG/ML IJ SOLN
1000.0000 ug | Freq: Once | INTRAMUSCULAR | Status: AC
  Administered 2015-08-31: 1000 ug via INTRAMUSCULAR

## 2015-08-31 MED ORDER — SODIUM CHLORIDE 0.9% FLUSH
10.0000 mL | INTRAVENOUS | Status: DC | PRN
  Administered 2015-08-31: 10 mL
  Filled 2015-08-31: qty 10

## 2015-08-31 NOTE — Progress Notes (Signed)
    [facility] [phone]   [phone]  OFFICE PROGRESS NOTE  Andrea, [name], MD [address] [name] [address]  DIAGNOSIS: Stage IV (T1a, N2, M1b) non-small cell lung cancer, adenocarcinoma with negative EGFR mutation diagnosed in August 2016 and presented with right hilar and subcarinal lymphadenopathy as well as large soft tissue mass in the left posterior iliac bone.  PRIOR THERAPY:  1) Status post palliative radiotherapy to the soft tissue mass in the left iliac bone under the care of Dr. Murray. 2) Systemic chemotherapy with carboplatin for AUC of 5 and Alimta 500 MG/M2 every 3 weeks. First dose expected on 02/22/2015. Status post 6 cycles. Starting from cycle #3 Alimta was reduced to 400 MG/M2 secondary to neutropenia after the first 2 cycles.  CURRENT THERAPY: Maintenance systemic chemotherapy with single agent Alimta 500 MG/M2 every 3 weeks. First dose 07/19/2015. Status post 2 cycles.  INTERVAL HISTORY: Andrea [name] 44 y.o. female returns to the clinic today for follow-up visit. The patient is feeling fine today with no specific complaints. She tolerated the second dose of maintenance chemotherapy with single agent Alimta fairly well except for mild nausea for a few days after the treatment. She denied having any significant chest pain, shortness of breath, cough or hemoptysis. She denied having any fever or chills. She has no nausea or vomiting. She is here today to start cycle #3 of her treatment.  MEDICAL HISTORY: Past Medical History  Diagnosis Date  . Low back pain radiating to left leg 11/29/2010  . Dysmenorrhea 12/06/2009    Qualifier: Diagnosis of  By: Herbin RN, Gladys    . Chronic back pain   . Family history of adverse reaction to anesthesia     mother stopped breathing after surgery  . Anxiety   . Depression   . Headache     stress  . S/P radiation therapy 01/01/2015 through 01/21/2015       Left iliac bone 3500 cGy in 14 sessions   . Shortness of breath dyspnea     with exertion, Pt denies 02/2015  . Anginal pain ([facility])     chest pain not sure what related to  . Non-small cell carcinoma of right lung, stage 4 ([facility]) 01/01/2015    ALLERGIES:  is allergic to morphine and related and hydrocodone.  MEDICATIONS:  Current Outpatient Prescriptions  Medication Sig Dispense Refill  . cyanocobalamin (,VITAMIN B-12,) 1000 MCG/ML injection Inject 1,000 mcg into the muscle once.    . dexamethasone (DECADRON) 4 MG tablet 4 mg po bid, the day before, day of and day after chemo 40 tablet 1  . feeding supplement, ENSURE ENLIVE, (ENSURE ENLIVE) LIQD Take 237 mLs by mouth 3 (three) times daily between meals. 237 mL 12  . folic acid (FOLVITE) 1 MG tablet Take 1 tablet (1 mg total) by mouth daily. 30 tablet 4  . lidocaine-prilocaine (EMLA) cream Apply 1 teaspoon to skin 1 to 2 hours before use cover to keep in contact with skin 30 g PRN  . loratadine-pseudoephedrine (CLARITIN-D 24-HOUR) 10-240 MG 24 hr tablet Take 1 tablet by mouth daily.    . Multiple Vitamins-Minerals (MULTIVITAMIN & MINERAL PO) Take 2 tablets by mouth daily.     . oxyCODONE-acetaminophen (PERCOCET) 10-325 MG tablet Take 1 tablet by mouth every 4 (four) hours as needed for pain. 60 tablet 0  . Prenatal Vit-Fe Fumarate-FA (PRENATAL VITAMIN PO) Take 1 tablet by mouth daily.    .   senna-docusate (SENOKOT-S) 8.6-50 MG per tablet Take 2 tablets by mouth at bedtime. 30 tablet 0  . guaiFENesin-dextromethorphan (ROBITUSSIN DM) 100-10 MG/5ML syrup Take 5 mLs by mouth every 4 (four) hours as needed for cough. Reported on 08/31/2015    . prochlorperazine (COMPAZINE) 10 MG tablet Take 1 tablet (10 mg total) by mouth every 6 (six) hours as needed for nausea or vomiting. (Patient not taking: Reported on 08/31/2015) 60 tablet 0   No current facility-administered medications  for this visit.    SURGICAL HISTORY:  Past Surgical History  Procedure Laterality Date  . Laproscopy surgery to check her tubes    . Lumbar laminectomy/decompression microdiscectomy Left 05/01/2014    Procedure: Microdiscectomy - L5-S1 - left;  Surgeon: David S Jones, MD;  Location: [facility];  Service: Neurosurgery;  Laterality: Left;    REVIEW OF SYSTEMS:  A comprehensive review of systems was negative except for: Gastrointestinal: positive for nausea Musculoskeletal: positive for back pain   PHYSICAL EXAMINATION: General appearance: alert, cooperative, fatigued and no distress Head: Normocephalic, without obvious abnormality, atraumatic Neck: no adenopathy, no JVD, supple, symmetrical, trachea midline and thyroid not enlarged, symmetric, no tenderness/mass/nodules Lymph nodes: Cervical, supraclavicular, and axillary nodes normal. Resp: clear to auscultation bilaterally Back: symmetric, no curvature. ROM normal. No CVA tenderness. Cardio: regular rate and rhythm, S1, S2 normal, no murmur, click, rub or gallop GI: soft, non-tender; bowel sounds normal; no masses,  no organomegaly Extremities: extremities normal, atraumatic, no cyanosis or edema Neurologic: Alert and oriented X 3, normal strength and tone. Normal symmetric reflexes. Normal coordination and gait  ECOG PERFORMANCE STATUS: 1 - Symptomatic but completely ambulatory  Blood pressure 133/86, pulse 98, temperature 98 F (36.7 C), temperature source Oral, resp. rate 20, height 5' 6" (1.676 m), weight 177 lb (80.287 kg), SpO2 100 %.  LABORATORY DATA: Lab Results  Component Value Date   WBC 7.5 08/31/2015   HGB 12.2 08/31/2015   HCT 35.9 08/31/2015   MCV 107.9* 08/31/2015   PLT 343 08/31/2015      Chemistry      Component Value Date/Time   NA 138 08/11/2015 1020   NA 141 03/08/2015 1100   K 3.8 08/11/2015 1020   K 4.2 03/08/2015 1100   CL 111 03/08/2015 1100   CO2 17* 08/11/2015 1020   CO2 22 03/08/2015 1100   BUN  8.2 08/11/2015 1020   BUN 8 03/08/2015 1100   CREATININE 1.2* 08/11/2015 1020   CREATININE 0.77 03/08/2015 1100   CREATININE 0.85 01/08/2014 1637      Component Value Date/Time   CALCIUM 10.2 08/11/2015 1020   CALCIUM 9.5 03/08/2015 1100   ALKPHOS 75 08/11/2015 1020   ALKPHOS 74 04/20/2013 1320   AST 30 08/11/2015 1020   AST 17 04/20/2013 1320   ALT 44 08/11/2015 1020   ALT 14 04/20/2013 1320   BILITOT 0.37 08/11/2015 1020   BILITOT 0.5 04/20/2013 1320       RADIOGRAPHIC STUDIES: No results found.  ASSESSMENT AND PLAN: This is a very pleasant 44 years old African-American female recently diagnosed with metastatic non-small cell lung cancer, adenocarcinoma with negative EGFR mutation status post palliative radiotherapy to the left iliac bone and soft tissue mass. She is currently undergoing systemic chemotherapy with carboplatin and Alimta status post 6 cycles.  She is currently undergoing maintenance chemotherapy with single agent Alimta status post 2 cycles. She tolerated the first 2 cycles well except for mild nausea. I recommended for the patient to proceed with   cycle #3 today as scheduled. For pain management, she will continue on Percocet to 10/325 mg by mouth every 6 hours as needed for pain. She was given a refill of her medication. For the metastatic bone disease, I would consider the patient for treatment with Xgeva once we receive dental clearance. The patient would come back for follow-up visit in 3 weeks with the start of cycle #4 after repeating CT scan of the chest, abdomen and pelvis for restaging of her disease. She was advised to call immediately if she has any concerning symptoms in the interval. The patient voices understanding of current disease status and treatment options and is in agreement with the current care plan.  All questions were answered. The patient knows to call the clinic with any problems, questions or concerns. We can certainly see the patient  much sooner if necessary.  Disclaimer: This note was dictated with voice recognition software. Similar sounding words can inadvertently be transcribed and may not be corrected upon review.

## 2015-08-31 NOTE — Patient Instructions (Signed)

## 2015-08-31 NOTE — Telephone Encounter (Signed)
Gave and printed appt sched and avs for pt for May thru July

## 2015-08-31 NOTE — Patient Instructions (Signed)
[facility] Discharge Instructions for Patients Receiving Chemotherapy  Today you received the following chemotherapy agents: Alimta.  To help prevent nausea and vomiting after your treatment, we encourage you to take your nausea medication:Compazine 10 mg every 6 hours as needed.   If you develop nausea and vomiting that is not controlled by your nausea medication, call the clinic.   BELOW ARE SYMPTOMS THAT SHOULD BE REPORTED IMMEDIATELY:  *FEVER GREATER THAN 100.5 F  *CHILLS WITH OR WITHOUT FEVER  NAUSEA AND VOMITING THAT IS NOT CONTROLLED WITH YOUR NAUSEA MEDICATION  *UNUSUAL SHORTNESS OF BREATH  *UNUSUAL BRUISING OR BLEEDING  TENDERNESS IN MOUTH AND THROAT WITH OR WITHOUT PRESENCE OF ULCERS  *URINARY PROBLEMS  *BOWEL PROBLEMS  UNUSUAL RASH Items with * indicate a potential emergency and should be followed up as soon as possible.  Feel free to call the clinic you have any questions or concerns. The clinic phone number is [phone].  Please show the [facility] at check-in to the Emergency Department and triage nurse.

## 2015-08-31 NOTE — Telephone Encounter (Signed)
Wig voucher mailed.

## 2015-09-08 ENCOUNTER — Other Ambulatory Visit: Payer: Self-pay | Admitting: *Deleted

## 2015-09-08 MED ORDER — OXYCODONE-ACETAMINOPHEN 10-325 MG PO TABS: 1.0000 | ORAL_TABLET | ORAL | Status: DC | PRN

## 2015-09-08 NOTE — Telephone Encounter (Signed)
Pt called request for refill on percocet. Reviewed with MD,informed pt rx ready for pick up

## 2015-09-09 MED FILL — OXYCODONE-APAP 10-325 TAB: 10-325 | 10 days supply | Qty: 60 | Fill #0

## 2015-09-16 ENCOUNTER — Ambulatory Visit (INDEPENDENT_AMBULATORY_CARE_PROVIDER_SITE_OTHER): Payer: Medicaid Other | Admitting: *Deleted

## 2015-09-16 DIAGNOSIS — Z3042 Encounter for surveillance of injectable contraceptive: Secondary | ICD-10-CM | POA: Diagnosis present

## 2015-09-16 MED ORDER — MEDROXYPROGESTERONE ACETATE 150 MG/ML IM SUSP
150.0000 mg | INTRAMUSCULAR | Status: DC
  Administered 2015-09-16: 150 mg via INTRAMUSCULAR

## 2015-09-17 ENCOUNTER — Ambulatory Visit (HOSPITAL_COMMUNITY)
Admission: RE | Admit: 2015-09-17 | Discharge: 2015-09-17 | Disposition: A | Discharge status: Home / Self Care | Payer: Medicaid Other | Source: Ambulatory Visit | Attending: Internal Medicine | Admitting: Internal Medicine

## 2015-09-17 DIAGNOSIS — C7951 Secondary malignant neoplasm of bone: Secondary | ICD-10-CM | POA: Diagnosis present

## 2015-09-17 DIAGNOSIS — R911 Solitary pulmonary nodule: Secondary | ICD-10-CM | POA: Insufficient documentation

## 2015-09-17 DIAGNOSIS — C3491 Malignant neoplasm of unspecified part of right bronchus or lung: Secondary | ICD-10-CM | POA: Insufficient documentation

## 2015-09-17 DIAGNOSIS — R938 Abnormal findings on diagnostic imaging of other specified body structures: Secondary | ICD-10-CM | POA: Diagnosis present

## 2015-09-17 DIAGNOSIS — Z5111 Encounter for antineoplastic chemotherapy: Secondary | ICD-10-CM

## 2015-09-17 MED ORDER — IOPAMIDOL (ISOVUE-300) INJECTION 61%
100.0000 mL | Freq: Once | INTRAVENOUS | Status: AC | PRN
  Administered 2015-09-17: 100 mL via INTRAVENOUS

## 2015-09-17 MED ORDER — DIATRIZOATE MEGLUMINE & SODIUM 66-10 % PO SOLN
30.0000 mL | Freq: Once | ORAL | Status: AC
  Administered 2015-09-17: 30 mL via ORAL

## 2015-09-21 ENCOUNTER — Telehealth: Payer: Self-pay | Admitting: Internal Medicine

## 2015-09-21 ENCOUNTER — Ambulatory Visit (HOSPITAL_BASED_OUTPATIENT_CLINIC_OR_DEPARTMENT_OTHER): Payer: Medicaid Other | Admitting: Internal Medicine

## 2015-09-21 ENCOUNTER — Other Ambulatory Visit: Payer: Self-pay

## 2015-09-21 ENCOUNTER — Ambulatory Visit: Payer: Medicaid Other

## 2015-09-21 ENCOUNTER — Ambulatory Visit: Payer: Self-pay

## 2015-09-21 ENCOUNTER — Encounter: Payer: Self-pay | Admitting: Internal Medicine

## 2015-09-21 ENCOUNTER — Other Ambulatory Visit (HOSPITAL_BASED_OUTPATIENT_CLINIC_OR_DEPARTMENT_OTHER): Payer: Medicaid Other

## 2015-09-21 VITALS — BP 160/76 | HR 82 | Temp 98.1°F | Resp 20 | Ht 66.0 in | Wt 182.3 lb

## 2015-09-21 DIAGNOSIS — C3491 Malignant neoplasm of unspecified part of right bronchus or lung: Secondary | ICD-10-CM

## 2015-09-21 DIAGNOSIS — E279 Disorder of adrenal gland, unspecified: Secondary | ICD-10-CM | POA: Diagnosis not present

## 2015-09-21 DIAGNOSIS — C7951 Secondary malignant neoplasm of bone: Secondary | ICD-10-CM

## 2015-09-21 DIAGNOSIS — E278 Other specified disorders of adrenal gland: Secondary | ICD-10-CM

## 2015-09-21 DIAGNOSIS — M898X9 Other specified disorders of bone, unspecified site: Secondary | ICD-10-CM

## 2015-09-21 DIAGNOSIS — Z95828 Presence of other vascular implants and grafts: Secondary | ICD-10-CM

## 2015-09-21 DIAGNOSIS — Z5111 Encounter for antineoplastic chemotherapy: Secondary | ICD-10-CM

## 2015-09-21 HISTORY — DX: Other specified disorders of adrenal gland: E27.8

## 2015-09-21 LAB — CBC WITH DIFFERENTIAL/PLATELET
BASO%: 0 % (ref 0.0–2.0)
BASOS ABS: 0 10*3/uL (ref 0.0–0.1)
EOS%: 0 % (ref 0.0–7.0)
Eosinophils Absolute: 0 10*3/uL (ref 0.0–0.5)
HCT: 37.1 % (ref 34.8–46.6)
HGB: 12.6 g/dL (ref 11.6–15.9)
LYMPH#: 0.7 10*3/uL — AB (ref 0.9–3.3)
LYMPH%: 14.3 % (ref 14.0–49.7)
MCH: 35.6 pg — AB (ref 25.1–34.0)
MCHC: 34 g/dL (ref 31.5–36.0)
MCV: 104.8 fL — ABNORMAL HIGH (ref 79.5–101.0)
MONO#: 0.2 10*3/uL (ref 0.1–0.9)
MONO%: 4.4 % (ref 0.0–14.0)
NEUT#: 3.9 10*3/uL (ref 1.5–6.5)
NEUT%: 81.3 % — AB (ref 38.4–76.8)
Platelets: 360 10*3/uL (ref 145–400)
RBC: 3.54 10*6/uL — ABNORMAL LOW (ref 3.70–5.45)
RDW: 13.2 % (ref 11.2–14.5)
WBC: 4.8 10*3/uL (ref 3.9–10.3)

## 2015-09-21 LAB — COMPREHENSIVE METABOLIC PANEL
ALT: 67 U/L — ABNORMAL HIGH (ref 0–55)
AST: 33 U/L (ref 5–34)
Albumin: 3.8 g/dL (ref 3.5–5.0)
Alkaline Phosphatase: 92 U/L (ref 40–150)
Anion Gap: 11 mEq/L (ref 3–11)
BUN: 5.4 mg/dL — AB (ref 7.0–26.0)
CALCIUM: 10.3 mg/dL (ref 8.4–10.4)
CHLORIDE: 109 meq/L (ref 98–109)
CO2: 18 mEq/L — ABNORMAL LOW (ref 22–29)
Creatinine: 1.1 mg/dL (ref 0.6–1.1)
EGFR: 72 mL/min/{1.73_m2} — ABNORMAL LOW (ref >90)
GLUCOSE: 243 mg/dL — AB (ref 70–140)
POTASSIUM: 4 meq/L (ref 3.5–5.1)
SODIUM: 138 meq/L (ref 136–145)
Total Bilirubin: <0.3 mg/dL (ref 0.20–1.20)
Total Protein: 8.3 g/dL (ref 6.4–8.3)

## 2015-09-21 MED ORDER — SODIUM CHLORIDE 0.9% FLUSH
10.0000 mL | INTRAVENOUS | Status: DC | PRN
  Administered 2015-09-21: 10 mL via INTRAVENOUS
  Filled 2015-09-21: qty 10

## 2015-09-21 MED ORDER — SODIUM CHLORIDE 0.9 % IJ SOLN
10.0000 mL | INTRAMUSCULAR | Status: DC | PRN
  Administered 2015-09-21: 10 mL via INTRAVENOUS
  Filled 2015-09-21: qty 10

## 2015-09-21 MED ORDER — HEPARIN SOD (PORK) LOCK FLUSH 100 UNIT/ML IV SOLN
500.0000 [IU] | Freq: Once | INTRAVENOUS | Status: AC
  Administered 2015-09-21: 500 [IU] via INTRAVENOUS
  Filled 2015-09-21: qty 5

## 2015-09-21 NOTE — Telephone Encounter (Signed)
Gave pt apt & avs °

## 2015-09-21 NOTE — Progress Notes (Signed)
[facility] [phone]   [phone]  OFFICE PROGRESS NOTE  [name], MD [place] [address]  DIAGNOSIS: Stage IV (T1a, N2, M1b) non-small cell lung cancer, adenocarcinoma with negative EGFR mutation diagnosed in August 2016 and presented with right hilar and subcarinal lymphadenopathy as well as large soft tissue mass in the left posterior iliac bone.  PRIOR THERAPY:  1) Status post palliative radiotherapy to the soft tissue mass in the left iliac bone under the care of Dr. Valere Dross. 2) Systemic chemotherapy with carboplatin for AUC of 5 and Alimta 500 MG/M2 every 3 weeks. First dose expected on 02/22/2015. Status post 6 cycles. Starting from cycle #3 Alimta was reduced to 400 MG/M2 secondary to neutropenia after the first 2 cycles.  CURRENT THERAPY: Maintenance systemic chemotherapy with single agent Alimta 500 MG/M2 every 3 weeks. First dose 07/19/2015. Status post 3 cycles.  INTERVAL HISTORY: Andrea [name] 45 y.o. female returns to the clinic today for follow-up visit. The patient is feeling fine today with no specific complaints. She tolerated the third dose of maintenance chemotherapy with single agent Alimta fairly well except for mild nausea for several days after the treatment. She denied having any significant chest pain, shortness of breath, cough or hemoptysis. She denied having any fever or chills. She has no nausea or vomiting. She had repeat CT scan of the chest, abdomen and pelvis performed recently and she is here for evaluation and discussion of her scan results.  MEDICAL HISTORY: Past Medical History  Diagnosis Date  . Low back pain radiating to left leg 11/29/2010  . Dysmenorrhea 12/06/2009    Qualifier: Diagnosis of  By: [name] RN, Regino Schultze    . Chronic back pain   . Family history of adverse reaction to anesthesia     mother stopped breathing after surgery  . Anxiety   . Depression   . Headache     stress  .  S/P radiation therapy 01/01/2015 through 01/21/2015      Left iliac bone 3500 cGy in 14 sessions   . Shortness of breath dyspnea     with exertion, Pt denies 02/2015  . Anginal pain ([place])     chest pain not sure what related to  . Non-small cell carcinoma of right lung, stage 4 ([place]) 01/01/2015    ALLERGIES:  is allergic to morphine and related and hydrocodone.  MEDICATIONS:  Current Outpatient Prescriptions  Medication Sig Dispense Refill  . cyanocobalamin (,VITAMIN B-12,) 1000 MCG/ML injection Inject 1,000 mcg into the muscle once.    [name] dexamethasone (DECADRON) 4 MG tablet 4 mg po bid, the day before, day of and day after chemo 40 tablet 1  . feeding supplement, ENSURE ENLIVE, (ENSURE ENLIVE) LIQD Take 237 mLs by mouth 3 (three) times daily between meals. 841 mL 12  . folic acid (FOLVITE) 1 MG tablet Take 1 tablet (1 mg total) by mouth daily. 30 tablet 4  . guaiFENesin-dextromethorphan (ROBITUSSIN DM) 100-10 MG/5ML syrup Take 5 mLs by mouth every 4 (four) hours as needed for cough. Reported on 08/31/2015    . lidocaine-prilocaine (EMLA) cream Apply 1 teaspoon to skin 1 to 2 hours before use cover to keep in contact with skin 30 g PRN  . loratadine-pseudoephedrine (CLARITIN-D 24-HOUR) 10-240 MG 24 hr tablet Take 1 tablet by mouth daily.    . Multiple Vitamins-Minerals (MULTIVITAMIN & MINERAL PO) Take 2 tablets by mouth daily.     [name]  oxyCODONE-acetaminophen (PERCOCET) 10-325 MG tablet Take 1 tablet by mouth every 4 (four) hours as needed for pain. 60 tablet 0  . Prenatal Vit-Fe Fumarate-FA (PRENATAL VITAMIN PO) Take 1 tablet by mouth daily.    . prochlorperazine (COMPAZINE) 10 MG tablet Take 1 tablet (10 mg total) by mouth every 6 (six) hours as needed for nausea or vomiting. 60 tablet 0  . senna-docusate (SENOKOT-S) 8.6-50 MG per tablet Take 2 tablets by mouth at bedtime. 30 tablet 0   No current facility-administered  medications for this visit.    SURGICAL HISTORY:  Past Surgical History  Procedure Laterality Date  . Laproscopy surgery to check her tubes    . Lumbar laminectomy/decompression microdiscectomy Left 05/01/2014    Procedure: Microdiscectomy - L5-S1 - left;  Surgeon: Eustace Moore, MD;  Location: [facility];  Service: Neurosurgery;  Laterality: Left;    REVIEW OF SYSTEMS:  Constitutional: negative Eyes: negative Ears, nose, mouth, throat, and face: negative Respiratory: negative Cardiovascular: negative Gastrointestinal: negative Genitourinary:negative Integument/breast: negative Hematologic/lymphatic: negative Musculoskeletal:positive for back pain Neurological: negative Behavioral/Psych: negative Endocrine: negative Allergic/Immunologic: negative   PHYSICAL EXAMINATION: General appearance: alert, cooperative, fatigued and no distress Head: Normocephalic, without obvious abnormality, atraumatic Neck: no adenopathy, no JVD, supple, symmetrical, trachea midline and thyroid not enlarged, symmetric, no tenderness/mass/nodules Lymph nodes: Cervical, supraclavicular, and axillary nodes normal. Resp: clear to auscultation bilaterally Back: symmetric, no curvature. ROM normal. No CVA tenderness. Cardio: regular rate and rhythm, S1, S2 normal, no murmur, click, rub or gallop GI: soft, non-tender; bowel sounds normal; no masses,  no organomegaly Extremities: extremities normal, atraumatic, no cyanosis or edema Neurologic: Alert and oriented X 3, normal strength and tone. Normal symmetric reflexes. Normal coordination and gait  ECOG PERFORMANCE STATUS: 1 - Symptomatic but completely ambulatory  Blood pressure 160/76, pulse 82, temperature 98.1 F (36.7 C), temperature source Oral, resp. rate 20, height 5' 6"  (1.676 m), weight 182 lb 4.8 oz (82.691 kg), SpO2 100 %.  LABORATORY DATA: Lab Results  Component Value Date   WBC 4.8 09/21/2015   HGB 12.6 09/21/2015   HCT 37.1 09/21/2015   MCV  104.8* 09/21/2015   PLT 360 09/21/2015      Chemistry      Component Value Date/Time   NA 138 09/21/2015 1107   NA 141 03/08/2015 1100   K 4.0 09/21/2015 1107   K 4.2 03/08/2015 1100   CL 111 03/08/2015 1100   CO2 18* 09/21/2015 1107   CO2 22 03/08/2015 1100   BUN 5.4* 09/21/2015 1107   BUN 8 03/08/2015 1100   CREATININE 1.1 09/21/2015 1107   CREATININE 0.77 03/08/2015 1100   CREATININE 0.85 01/08/2014 1637      Component Value Date/Time   CALCIUM 10.3 09/21/2015 1107   CALCIUM 9.5 03/08/2015 1100   ALKPHOS 92 09/21/2015 1107   ALKPHOS 74 04/20/2013 1320   AST 33 09/21/2015 1107   AST 17 04/20/2013 1320   ALT 67* 09/21/2015 1107   ALT 14 04/20/2013 1320   BILITOT <0.30 09/21/2015 1107   BILITOT 0.5 04/20/2013 1320       RADIOGRAPHIC STUDIES: Ct Chest W Contrast  09/17/2015  CLINICAL DATA:  Patient with history of non-small cell lung cancer with osseous metastasis. EXAM: CT CHEST, ABDOMEN AND PELVIS WITHOUT CONTRAST TECHNIQUE: Multidetector CT imaging of the chest, abdomen and pelvis was performed following the standard protocol without IV contrast. COMPARISON:  CT CAP 07/12/2015 FINDINGS: CT CHEST FINDINGS Mediastinum/Lymph Nodes: Visualized thyroid is unremarkable. Right anterior chest  wall Port-A-Cath is present with tip terminating in the superior vena cava. Normal heart size. No pericardial effusion. Aorta and main pulmonary artery are normal in caliber. No axillary, mediastinal or hilar lymphadenopathy. Lungs/Pleura: Central airways are patent. Slight interval increase in size of right lower low nodule within the infrahilar location measuring 1.7 x 1.5 cm (image 65; series 5), previously 1.8 x 1.1 cm. Grossly unchanged adjacent infrahilar nodule measuring 7 mm (image 61; series 5). Centrilobular emphysematous change. Ground-glass opacities demonstrated within the lower lobes bilaterally. No pleural effusion or pneumothorax. Musculoskeletal: No aggressive or acute appearing  osseus lesions. CT ABDOMEN PELVIS FINDINGS Hepatobiliary: Stable 5 mm hypodense lesion within the right hepatic lobe (image 6; series 2), too small to characterize. No new or enlarging hepatic lesions are identified. Gallbladder is decompressed. No intrahepatic or extrahepatic biliary ductal dilatation. Pancreas: Unremarkable Spleen: Unremarkable Adrenals/Urinary Tract: Interval development of a 2.0 x 1.0 cm left adrenal nodule (image 52; series 2). Right adrenal gland is unremarkable. Kidneys enhance symmetrically with contrast. Stable bilateral subcentimeter renal hypodensities, too small to characterize. Stomach/Bowel: The appendix is normal. No abnormal bowel wall thickening or evidence for bowel obstruction. No free fluid or free intraperitoneal air. Normal morphology of the stomach. Vascular/Lymphatic: Normal caliber abdominal aorta. No retroperitoneal lymphadenopathy. Peripheral calcified noncalcified atherosclerotic plaque involving the infrarenal abdominal aorta. No retroperitoneal lymphadenopathy. Reproductive: Uterus and adnexal structures are unremarkable. Other: None Musculoskeletal: Re- demonstrated 2.4 x 2.0 cm osseous metastatic lesion within the posterior left iliac bone (image 95; series 2). Adjacent 7 mm lucent satellite lesion is stable. IMPRESSION: Interval development of a left adrenal nodule concerning for the possibility of adrenal metastasis. Slight interval increase in size of right lower lobe infrahilar nodule. Grossly unchanged osseous metastatic lesion within the posterior left iliac bone. Electronically Signed   By: Lovey Newcomer M.D.   On: 09/17/2015 09:18   Ct Abdomen Pelvis W Contrast  09/17/2015  CLINICAL DATA:  Patient with history of non-small cell lung cancer with osseous metastasis. EXAM: CT CHEST, ABDOMEN AND PELVIS WITHOUT CONTRAST TECHNIQUE: Multidetector CT imaging of the chest, abdomen and pelvis was performed following the standard protocol without IV contrast.  COMPARISON:  CT CAP 07/12/2015 FINDINGS: CT CHEST FINDINGS Mediastinum/Lymph Nodes: Visualized thyroid is unremarkable. Right anterior chest wall Port-A-Cath is present with tip terminating in the superior vena cava. Normal heart size. No pericardial effusion. Aorta and main pulmonary artery are normal in caliber. No axillary, mediastinal or hilar lymphadenopathy. Lungs/Pleura: Central airways are patent. Slight interval increase in size of right lower low nodule within the infrahilar location measuring 1.7 x 1.5 cm (image 65; series 5), previously 1.8 x 1.1 cm. Grossly unchanged adjacent infrahilar nodule measuring 7 mm (image 61; series 5). Centrilobular emphysematous change. Ground-glass opacities demonstrated within the lower lobes bilaterally. No pleural effusion or pneumothorax. Musculoskeletal: No aggressive or acute appearing osseus lesions. CT ABDOMEN PELVIS FINDINGS Hepatobiliary: Stable 5 mm hypodense lesion within the right hepatic lobe (image 6; series 2), too small to characterize. No new or enlarging hepatic lesions are identified. Gallbladder is decompressed. No intrahepatic or extrahepatic biliary ductal dilatation. Pancreas: Unremarkable Spleen: Unremarkable Adrenals/Urinary Tract: Interval development of a 2.0 x 1.0 cm left adrenal nodule (image 52; series 2). Right adrenal gland is unremarkable. Kidneys enhance symmetrically with contrast. Stable bilateral subcentimeter renal hypodensities, too small to characterize. Stomach/Bowel: The appendix is normal. No abnormal bowel wall thickening or evidence for bowel obstruction. No free fluid or free intraperitoneal air. Normal morphology of the stomach.  Vascular/Lymphatic: Normal caliber abdominal aorta. No retroperitoneal lymphadenopathy. Peripheral calcified noncalcified atherosclerotic plaque involving the infrarenal abdominal aorta. No retroperitoneal lymphadenopathy. Reproductive: Uterus and adnexal structures are unremarkable. Other: None  Musculoskeletal: Re- demonstrated 2.4 x 2.0 cm osseous metastatic lesion within the posterior left iliac bone (image 95; series 2). Adjacent 7 mm lucent satellite lesion is stable. IMPRESSION: Interval development of a left adrenal nodule concerning for the possibility of adrenal metastasis. Slight interval increase in size of right lower lobe infrahilar nodule. Grossly unchanged osseous metastatic lesion within the posterior left iliac bone. Electronically Signed   By: Lovey Newcomer M.D.   On: 09/17/2015 09:18    ASSESSMENT AND PLAN: This is a very pleasant 45 years old African-American female recently diagnosed with metastatic non-small cell lung cancer, adenocarcinoma with negative EGFR mutation status post palliative radiotherapy to the left iliac bone and soft tissue mass. She is currently undergoing systemic chemotherapy with carboplatin and Alimta status post 6 cycles.  She is currently undergoing maintenance chemotherapy with single agent Alimta status post 3 cycles. She tolerated the first 3 cycles well except for mild nausea. The restaging CT scan of the chest, abdomen and pelvis showed stable disease in the chest but there was development of left adrenal nodule concerning for possibility of adrenal metastasis. There was also mild increase in the size of the right lower lobe and from a hilar nodule. I discussed the scan results and showed the images to the patient. I recommended for her to hold her systemic treatment with maintenance Alimta for now. I will order a PET scan for further evaluation of her disease and to rule out the presence of metastatic disease in the adrenal gland. I will see the patient back for follow-up visit in 2 weeks for reevaluation and more detailed discussion of her treatment options after the PET scan. For pain management, she will continue on Percocet to 10/325 mg by mouth every 6 hours as needed for pain.  For the metastatic bone disease, I would consider the patient  for treatment with Xgeva once we receive dental clearance. She was advised to call immediately if she has any concerning symptoms in the interval. The patient voices understanding of current disease status and treatment options and is in agreement with the current care plan.  All questions were answered. The patient knows to call the clinic with any problems, questions or concerns. We can certainly see the patient much sooner if necessary.  Disclaimer: This note was dictated with voice recognition software. Similar sounding words can inadvertently be transcribed and may not be corrected upon review.

## 2015-09-23 ENCOUNTER — Telehealth: Payer: Self-pay | Admitting: Medical Oncology

## 2015-09-23 NOTE — Telephone Encounter (Signed)
Faxed form to [facility] - Per [place], Pt is not cleared to particiapte in exercise program at the [facility],

## 2015-09-30 ENCOUNTER — Ambulatory Visit (HOSPITAL_COMMUNITY)
Admission: RE | Admit: 2015-09-30 | Discharge: 2015-09-30 | Disposition: A | Discharge status: Home / Self Care | Payer: Medicaid Other | Source: Ambulatory Visit | Attending: Internal Medicine | Admitting: Internal Medicine

## 2015-09-30 DIAGNOSIS — E279 Disorder of adrenal gland, unspecified: Secondary | ICD-10-CM | POA: Insufficient documentation

## 2015-09-30 DIAGNOSIS — C775 Secondary and unspecified malignant neoplasm of intrapelvic lymph nodes: Secondary | ICD-10-CM | POA: Diagnosis not present

## 2015-09-30 DIAGNOSIS — C7951 Secondary malignant neoplasm of bone: Secondary | ICD-10-CM | POA: Insufficient documentation

## 2015-09-30 DIAGNOSIS — C7972 Secondary malignant neoplasm of left adrenal gland: Secondary | ICD-10-CM | POA: Insufficient documentation

## 2015-09-30 DIAGNOSIS — C3491 Malignant neoplasm of unspecified part of right bronchus or lung: Secondary | ICD-10-CM | POA: Insufficient documentation

## 2015-09-30 DIAGNOSIS — C7971 Secondary malignant neoplasm of right adrenal gland: Secondary | ICD-10-CM | POA: Diagnosis not present

## 2015-09-30 DIAGNOSIS — E278 Other specified disorders of adrenal gland: Secondary | ICD-10-CM

## 2015-09-30 LAB — GLUCOSE, CAPILLARY: GLUCOSE-CAPILLARY: 118 mg/dL — AB (ref 65–99)

## 2015-09-30 MED ORDER — FLUDEOXYGLUCOSE F - 18 (FDG) INJECTION
8.9500 | Freq: Once | INTRAVENOUS | Status: AC | PRN
  Administered 2015-09-30: 8.95 via INTRAVENOUS

## 2015-10-07 ENCOUNTER — Ambulatory Visit (HOSPITAL_BASED_OUTPATIENT_CLINIC_OR_DEPARTMENT_OTHER): Payer: Medicaid Other | Admitting: Internal Medicine

## 2015-10-07 ENCOUNTER — Telehealth: Payer: Self-pay | Admitting: Internal Medicine

## 2015-10-07 ENCOUNTER — Encounter: Payer: Self-pay | Admitting: Internal Medicine

## 2015-10-07 VITALS — BP 122/77 | HR 96 | Temp 97.7°F | Resp 18 | Ht 66.0 in | Wt 185.6 lb

## 2015-10-07 DIAGNOSIS — Z5112 Encounter for antineoplastic immunotherapy: Secondary | ICD-10-CM | POA: Insufficient documentation

## 2015-10-07 DIAGNOSIS — C3491 Malignant neoplasm of unspecified part of right bronchus or lung: Secondary | ICD-10-CM

## 2015-10-07 DIAGNOSIS — R5382 Chronic fatigue, unspecified: Secondary | ICD-10-CM | POA: Insufficient documentation

## 2015-10-07 HISTORY — DX: Encounter for antineoplastic immunotherapy: Z51.12

## 2015-10-07 NOTE — Telephone Encounter (Signed)
Gave and pritned appt sched and avs fo rpt for June and July

## 2015-10-07 NOTE — Progress Notes (Signed)
[place] [phone]   [phone]  OFFICE PROGRESS NOTE  [name], MD [place] [address]  DIAGNOSIS: Stage IV (T1a, N2, M1b) non-small cell lung cancer, adenocarcinoma with negative EGFR mutation diagnosed in August 2016 and presented with right hilar and subcarinal lymphadenopathy as well as large soft tissue mass in the left posterior iliac bone.  PRIOR THERAPY:  1) Status post palliative radiotherapy to the soft tissue mass in the left iliac bone under the care of Dr. Valere Dross. 2) Systemic chemotherapy with carboplatin for AUC of 5 and Alimta 500 MG/M2 every 3 weeks. First dose expected on 02/22/2015. Status post 6 cycles. Starting from cycle #3 Alimta was reduced to 400 MG/M2 secondary to neutropenia after the first 2 cycles. 3) Maintenance systemic chemotherapy with single agent Alimta 500 MG/M2 every 3 weeks. First dose 07/19/2015. Status post 3 cycles. Last dose was given 08/30/2015 discontinued secondary to disease progression.  CURRENT THERAPY: Immunotherapy treatment with Tecentriq (Atezolizumab) 1200 MG IV every 3 weeks, for his dose 10/14/2015.  INTERVAL HISTORY: Andrea [name] 45 y.o. female returns to the clinic today for follow-up visit. The patient is feeling fine today with no specific complaints. She has been on treatment with maintenance Alimta for 3 cycles but this was discontinued secondary to disease progression. She had repeat PET scan performed recently and she is here for evaluation and discussion of her treatment options based on the recent PET scan. She denied having any significant chest pain, shortness of breath, cough or hemoptysis. She denied having any fever or chills. She has no nausea or vomiting. She denied having any significant weight loss or night sweats. The patient feels much better and she exercises regularly.  MEDICAL HISTORY: Past Medical History  Diagnosis Date  . Low back pain radiating  to left leg 11/29/2010  . Dysmenorrhea 12/06/2009    Qualifier: Diagnosis of  By: [name] RN, Regino Schultze    . Chronic back pain   . Family history of adverse reaction to anesthesia     mother stopped breathing after surgery  . Anxiety   . Depression   . Headache     stress  . S/P radiation therapy 01/01/2015 through 01/21/2015      Left iliac bone 3500 cGy in 14 sessions   . Shortness of breath dyspnea     with exertion, Pt denies 02/2015  . Anginal pain ([place])     chest pain not sure what related to  . Non-small cell carcinoma of right lung, stage 4 ([place]) 01/01/2015  . 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  MEDICATIONS:  Current Outpatient Prescriptions  Medication Sig Dispense Refill  . cyanocobalamin (,VITAMIN B-12,) 1000 MCG/ML injection Inject 1,000 mcg into the muscle once.    [name] dexamethasone (DECADRON) 4 MG tablet 4 mg po bid, the day before, day of and day after chemo 40 tablet 1  . feeding supplement, ENSURE ENLIVE, (ENSURE ENLIVE) LIQD Take 237 mLs by mouth 3 (three) times daily between meals. 287 mL 12  . folic acid (FOLVITE) 1 MG tablet Take 1 tablet (1 mg total) by mouth daily. 30 tablet 4  . guaiFENesin-dextromethorphan (ROBITUSSIN DM) 100-10 MG/5ML syrup Take 5 mLs by mouth every 4 (four) hours as needed for cough. Reported on 08/31/2015    . lidocaine-prilocaine (EMLA) cream Apply 1 teaspoon to skin 1 to 2 hours before use  cover to keep in contact with skin 30 g PRN  . loratadine-pseudoephedrine (CLARITIN-D 24-HOUR) 10-240 MG 24 hr tablet Take 1 tablet by mouth daily.    . Multiple Vitamins-Minerals (MULTIVITAMIN & MINERAL PO) Take 2 tablets by mouth daily.     [name] oxyCODONE-acetaminophen (PERCOCET) 10-325 MG tablet Take 1 tablet by mouth every 4 (four) hours as needed for pain. 60 tablet 0  . Prenatal Vit-Fe Fumarate-FA (PRENATAL VITAMIN PO) Take  1 tablet by mouth daily.    [name] senna-docusate (SENOKOT-S) 8.6-50 MG per tablet Take 2 tablets by mouth at bedtime. 30 tablet 0  . prochlorperazine (COMPAZINE) 10 MG tablet Take 1 tablet (10 mg total) by mouth every 6 (six) hours as needed for nausea or vomiting. (Patient not taking: Reported on 10/07/2015) 60 tablet 0   No current facility-administered medications for this visit.    SURGICAL HISTORY:  Past Surgical History  Procedure Laterality Date  . Laproscopy surgery to check her tubes    . Lumbar laminectomy/decompression microdiscectomy Left 05/01/2014    Procedure: Microdiscectomy - L5-S1 - left;  Surgeon: Eustace Moore, MD;  Location: [facility];  Service: Neurosurgery;  Laterality: Left;    REVIEW OF SYSTEMS:  Constitutional: negative Eyes: negative Ears, nose, mouth, throat, and face: negative Respiratory: negative Cardiovascular: negative Gastrointestinal: negative Genitourinary:negative Integument/breast: negative Hematologic/lymphatic: negative Musculoskeletal:positive for back pain Neurological: negative Behavioral/Psych: negative Endocrine: negative Allergic/Immunologic: negative   PHYSICAL EXAMINATION: General appearance: alert, cooperative, fatigued and no distress Head: Normocephalic, without obvious abnormality, atraumatic Neck: no adenopathy, no JVD, supple, symmetrical, trachea midline and thyroid not enlarged, symmetric, no tenderness/mass/nodules Lymph nodes: Cervical, supraclavicular, and axillary nodes normal. Resp: clear to auscultation bilaterally Back: symmetric, no curvature. ROM normal. No CVA tenderness. Cardio: regular rate and rhythm, S1, S2 normal, no murmur, click, rub or gallop GI: soft, non-tender; bowel sounds normal; no masses,  no organomegaly Extremities: extremities normal, atraumatic, no cyanosis or edema Neurologic: Alert and oriented X 3, normal strength and tone. Normal symmetric reflexes. Normal coordination and gait  ECOG PERFORMANCE  STATUS: 1 - Symptomatic but completely ambulatory  Blood pressure 122/77, pulse 96, temperature 97.7 F (36.5 C), temperature source Oral, resp. rate 18, height 5' 6"  (1.676 m), weight 185 lb 9.6 oz (84.188 kg), SpO2 100 %.  LABORATORY DATA: Lab Results  Component Value Date   WBC 4.8 09/21/2015   HGB 12.6 09/21/2015   HCT 37.1 09/21/2015   MCV 104.8* 09/21/2015   PLT 360 09/21/2015      Chemistry      Component Value Date/Time   NA 138 09/21/2015 1107   NA 141 03/08/2015 1100   K 4.0 09/21/2015 1107   K 4.2 03/08/2015 1100   CL 111 03/08/2015 1100   CO2 18* 09/21/2015 1107   CO2 22 03/08/2015 1100   BUN 5.4* 09/21/2015 1107   BUN 8 03/08/2015 1100   CREATININE 1.1 09/21/2015 1107   CREATININE 0.77 03/08/2015 1100   CREATININE 0.85 01/08/2014 1637      Component Value Date/Time   CALCIUM 10.3 09/21/2015 1107   CALCIUM 9.5 03/08/2015 1100   ALKPHOS 92 09/21/2015 1107   ALKPHOS 74 04/20/2013 1320   AST 33 09/21/2015 1107   AST 17 04/20/2013 1320   ALT 67* 09/21/2015 1107   ALT 14 04/20/2013 1320   BILITOT <0.30 09/21/2015 1107   BILITOT 0.5 04/20/2013 1320       RADIOGRAPHIC STUDIES: Ct Chest W Contrast  09/17/2015  CLINICAL DATA:  Patient with history  of non-small cell lung cancer with osseous metastasis. EXAM: CT CHEST, ABDOMEN AND PELVIS WITHOUT CONTRAST TECHNIQUE: Multidetector CT imaging of the chest, abdomen and pelvis was performed following the standard protocol without IV contrast. COMPARISON:  CT CAP 07/12/2015 FINDINGS: CT CHEST FINDINGS Mediastinum/Lymph Nodes: Visualized thyroid is unremarkable. Right anterior chest wall Port-A-Cath is present with tip terminating in the superior vena cava. Normal heart size. No pericardial effusion. Aorta and main pulmonary artery are normal in caliber. No axillary, mediastinal or hilar lymphadenopathy. Lungs/Pleura: Central airways are patent. Slight interval increase in size of right lower low nodule within the infrahilar  location measuring 1.7 x 1.5 cm (image 65; series 5), previously 1.8 x 1.1 cm. Grossly unchanged adjacent infrahilar nodule measuring 7 mm (image 61; series 5). Centrilobular emphysematous change. Ground-glass opacities demonstrated within the lower lobes bilaterally. No pleural effusion or pneumothorax. Musculoskeletal: No aggressive or acute appearing osseus lesions. CT ABDOMEN PELVIS FINDINGS Hepatobiliary: Stable 5 mm hypodense lesion within the right hepatic lobe (image 6; series 2), too small to characterize. No new or enlarging hepatic lesions are identified. Gallbladder is decompressed. No intrahepatic or extrahepatic biliary ductal dilatation. Pancreas: Unremarkable Spleen: Unremarkable Adrenals/Urinary Tract: Interval development of a 2.0 x 1.0 cm left adrenal nodule (image 52; series 2). Right adrenal gland is unremarkable. Kidneys enhance symmetrically with contrast. Stable bilateral subcentimeter renal hypodensities, too small to characterize. Stomach/Bowel: The appendix is normal. No abnormal bowel wall thickening or evidence for bowel obstruction. No free fluid or free intraperitoneal air. Normal morphology of the stomach. Vascular/Lymphatic: Normal caliber abdominal aorta. No retroperitoneal lymphadenopathy. Peripheral calcified noncalcified atherosclerotic plaque involving the infrarenal abdominal aorta. No retroperitoneal lymphadenopathy. Reproductive: Uterus and adnexal structures are unremarkable. Other: None Musculoskeletal: Re- demonstrated 2.4 x 2.0 cm osseous metastatic lesion within the posterior left iliac bone (image 95; series 2). Adjacent 7 mm lucent satellite lesion is stable. IMPRESSION: Interval development of a left adrenal nodule concerning for the possibility of adrenal metastasis. Slight interval increase in size of right lower lobe infrahilar nodule. Grossly unchanged osseous metastatic lesion within the posterior left iliac bone. Electronically Signed   By: Lovey Newcomer M.D.    On: 09/17/2015 09:18   Ct Abdomen Pelvis W Contrast  09/17/2015  CLINICAL DATA:  Patient with history of non-small cell lung cancer with osseous metastasis. EXAM: CT CHEST, ABDOMEN AND PELVIS WITHOUT CONTRAST TECHNIQUE: Multidetector CT imaging of the chest, abdomen and pelvis was performed following the standard protocol without IV contrast. COMPARISON:  CT CAP 07/12/2015 FINDINGS: CT CHEST FINDINGS Mediastinum/Lymph Nodes: Visualized thyroid is unremarkable. Right anterior chest wall Port-A-Cath is present with tip terminating in the superior vena cava. Normal heart size. No pericardial effusion. Aorta and main pulmonary artery are normal in caliber. No axillary, mediastinal or hilar lymphadenopathy. Lungs/Pleura: Central airways are patent. Slight interval increase in size of right lower low nodule within the infrahilar location measuring 1.7 x 1.5 cm (image 65; series 5), previously 1.8 x 1.1 cm. Grossly unchanged adjacent infrahilar nodule measuring 7 mm (image 61; series 5). Centrilobular emphysematous change. Ground-glass opacities demonstrated within the lower lobes bilaterally. No pleural effusion or pneumothorax. Musculoskeletal: No aggressive or acute appearing osseus lesions. CT ABDOMEN PELVIS FINDINGS Hepatobiliary: Stable 5 mm hypodense lesion within the right hepatic lobe (image 6; series 2), too small to characterize. No new or enlarging hepatic lesions are identified. Gallbladder is decompressed. No intrahepatic or extrahepatic biliary ductal dilatation. Pancreas: Unremarkable Spleen: Unremarkable Adrenals/Urinary Tract: Interval development of a 2.0 x 1.0 cm  left adrenal nodule (image 52; series 2). Right adrenal gland is unremarkable. Kidneys enhance symmetrically with contrast. Stable bilateral subcentimeter renal hypodensities, too small to characterize. Stomach/Bowel: The appendix is normal. No abnormal bowel wall thickening or evidence for bowel obstruction. No free fluid or free  intraperitoneal air. Normal morphology of the stomach. Vascular/Lymphatic: Normal caliber abdominal aorta. No retroperitoneal lymphadenopathy. Peripheral calcified noncalcified atherosclerotic plaque involving the infrarenal abdominal aorta. No retroperitoneal lymphadenopathy. Reproductive: Uterus and adnexal structures are unremarkable. Other: None Musculoskeletal: Re- demonstrated 2.4 x 2.0 cm osseous metastatic lesion within the posterior left iliac bone (image 95; series 2). Adjacent 7 mm lucent satellite lesion is stable. IMPRESSION: Interval development of a left adrenal nodule concerning for the possibility of adrenal metastasis. Slight interval increase in size of right lower lobe infrahilar nodule. Grossly unchanged osseous metastatic lesion within the posterior left iliac bone. Electronically Signed   By: Lovey Newcomer M.D.   On: 09/17/2015 09:18   Nm Pet Image Initial (pi) Skull Base To Thigh  09/30/2015  CLINICAL DATA:  Subsequent treatment strategy for metastatic lung adenocarcinoma diagnosed on 12/21/2014 left iliac bone metastasis biopsy. No left adrenal nodule on recent restaging CT. EXAM: NUCLEAR MEDICINE PET SKULL BASE TO THIGH TECHNIQUE: 9.0 mCi F-18 FDG was injected intravenously. Full-ring PET imaging was performed from the skull base to thigh after the radiotracer. CT data was obtained and used for attenuation correction and anatomic localization. FASTING BLOOD GLUCOSE:  Value: 118 mg/dl COMPARISON:  09/17/2015 CT chest, abdomen and pelvis. FINDINGS: NECK No hypermetabolic lymph nodes in the neck. Symmetric hypermetabolism in the palatine tonsils is probably physiologic. CHEST Hypermetabolic 2.1 x 1.7 cm central right lower lobe pulmonary nodule (series 6/image 32) with max SUV 14.1. Hypermetabolic 0.8 cm right paratracheal node (series 4/image 56) with max SUV 6.7. No hypermetabolic axillary nodes. Right internal jugular MediPort terminates at the cavoatrial junction. Trace pericardial  fluid/thickening. No pleural effusions. Mild centrilobular emphysema. No acute consolidative airspace disease or additional significant pulmonary nodules. ABDOMEN/PELVIS Hypermetabolic 1.1 x 0.6 cm right adrenal nodule (series 4/image 100) with max SUV 7.0. Hypermetabolic 1.9 x 1.1 cm left adrenal nodule (series 4/image 100) with max SUV 16.1. Hypermetabolic 0.4 cm right common iliac node (series 4/image 146) with max SUV 4.3. No additional hypermetabolic abdominopelvic lymph nodes. No abnormal hypermetabolic activity within the liver, pancreas, or spleen. Hypodense 0.4 cm right liver lobe lesion is unchanged in size since 12/29/2014 and is non hypermetabolic, suggesting a benign lesion. Simple 1.3 cm renal cyst in the posterior interpolar right kidney. SKELETON Hypermetabolic mixed lytic and sclerotic medial left iliac bone lesion with max SUV 8.0 (series 4/ image 152). No additional hypermetabolic skeletal lesions. IMPRESSION: 1. Hypermetabolic 2.1 cm central right lower lobe pulmonary nodule, consistent with primary bronchogenic carcinoma. 2. Hypermetabolic subcentimeter right paratracheal and right common iliac nodal metastasis. 3. Hypermetabolic bilateral adrenal metastases, which are new since 07/12/2015. 4. Hypermetabolic medial left iliac bone mixed lytic and sclerotic osseous metastasis. Electronically Signed   By: Ilona Sorrel M.D.   On: 09/30/2015 09:13    ASSESSMENT AND PLAN: This is a very pleasant 45 years old African-American female recently diagnosed with metastatic non-small cell lung cancer, adenocarcinoma with negative EGFR mutation status post palliative radiotherapy to the left iliac bone and soft tissue mass. She is currently undergoing systemic chemotherapy with carboplatin and Alimta status post 6 cycles.  She is currently undergoing maintenance chemotherapy with single agent Alimta status post 3 cycles. She tolerated the first 3 cycles  well except for mild nausea. The restaging CT scan  of the chest, abdomen and pelvis showed stable disease in the chest but there was development of left adrenal nodule concerning for possibility of adrenal metastasis. There was also mild increase in the size of the right lower lobe and from a hilar nodule. The recent PET scan confirms evidence of disease progression with new bilateral adrenal metastases in addition to hypermetabolic right paratracheal and right common iliac nodal metastasis. I discussed the scan results and showed the images to the patient today. I recommended for her to discontinue her current maintenance treatment with Alimta. I discussed with the patient other treatment options including treatment with immunotherapy with Tecentriq (Atezolizumab) 1200 MG IV every 3 weeks. I discussed with the patient adverse effect of this treatment including but not limited to immune mediated skin rash, diarrhea, inflammatory process in the lung, kidney, liver, thyroid or other endocrine dysfunction. The patient would like to proceed with treatment as planned. She is expected to start the first dose of this treatment on 10/14/2015. She will come back for follow-up visit in 4 weeks with the start of cycle #2. For pain management, she will continue on Percocet to 10/325 mg by mouth every 6 hours as needed for pain.  For the metastatic bone disease, I would consider the patient for treatment with Xgeva once we receive dental clearance. She was advised to call immediately if she has any concerning symptoms in the interval. The patient voices understanding of current disease status and treatment options and is in agreement with the current care plan.  All questions were answered. The patient knows to call the clinic with any problems, questions or concerns. We can certainly see the patient much sooner if necessary.  Disclaimer: This note was dictated with voice recognition software. Similar sounding words can inadvertently be transcribed and may not be  corrected upon review.

## 2015-10-11 ENCOUNTER — Other Ambulatory Visit: Payer: Self-pay | Admitting: *Deleted

## 2015-10-11 ENCOUNTER — Other Ambulatory Visit: Payer: Self-pay | Admitting: Medical Oncology

## 2015-10-11 ENCOUNTER — Telehealth: Payer: Self-pay | Admitting: Internal Medicine

## 2015-10-11 DIAGNOSIS — C3491 Malignant neoplasm of unspecified part of right bronchus or lung: Secondary | ICD-10-CM

## 2015-10-11 DIAGNOSIS — C7951 Secondary malignant neoplasm of bone: Secondary | ICD-10-CM

## 2015-10-11 NOTE — Telephone Encounter (Signed)
md apt cx per pof

## 2015-10-12 ENCOUNTER — Other Ambulatory Visit (HOSPITAL_BASED_OUTPATIENT_CLINIC_OR_DEPARTMENT_OTHER): Payer: Medicaid Other

## 2015-10-12 ENCOUNTER — Ambulatory Visit: Payer: Medicaid Other

## 2015-10-12 ENCOUNTER — Ambulatory Visit (HOSPITAL_BASED_OUTPATIENT_CLINIC_OR_DEPARTMENT_OTHER): Payer: Medicaid Other

## 2015-10-12 ENCOUNTER — Other Ambulatory Visit: Payer: Self-pay

## 2015-10-12 ENCOUNTER — Ambulatory Visit: Payer: Self-pay | Admitting: Internal Medicine

## 2015-10-12 ENCOUNTER — Other Ambulatory Visit: Payer: Self-pay | Admitting: Medical Oncology

## 2015-10-12 VITALS — BP 114/66 | HR 81 | Temp 97.4°F | Resp 16

## 2015-10-12 DIAGNOSIS — C3491 Malignant neoplasm of unspecified part of right bronchus or lung: Secondary | ICD-10-CM | POA: Diagnosis not present

## 2015-10-12 DIAGNOSIS — C7951 Secondary malignant neoplasm of bone: Secondary | ICD-10-CM

## 2015-10-12 DIAGNOSIS — R5382 Chronic fatigue, unspecified: Secondary | ICD-10-CM

## 2015-10-12 DIAGNOSIS — Z5112 Encounter for antineoplastic immunotherapy: Secondary | ICD-10-CM

## 2015-10-12 LAB — CBC WITH DIFFERENTIAL/PLATELET
BASO%: 1.2 % (ref 0.0–2.0)
BASOS ABS: 0 10*3/uL (ref 0.0–0.1)
EOS ABS: 0.1 10*3/uL (ref 0.0–0.5)
EOS%: 3.3 % (ref 0.0–7.0)
HCT: 38 % (ref 34.8–46.6)
HGB: 12.7 g/dL (ref 11.6–15.9)
LYMPH%: 39.3 % (ref 14.0–49.7)
MCH: 34.6 pg — ABNORMAL HIGH (ref 25.1–34.0)
MCHC: 33.4 g/dL (ref 31.5–36.0)
MCV: 103.6 fL — ABNORMAL HIGH (ref 79.5–101.0)
MONO#: 0.4 10*3/uL (ref 0.1–0.9)
MONO%: 14 % (ref 0.0–14.0)
NEUT%: 42.2 % (ref 38.4–76.8)
NEUTROS ABS: 1.3 10*3/uL — AB (ref 1.5–6.5)
PLATELETS: 312 10*3/uL (ref 145–400)
RBC: 3.67 10*6/uL — AB (ref 3.70–5.45)
RDW: 13.7 % (ref 11.2–14.5)
WBC: 3.2 10*3/uL — AB (ref 3.9–10.3)
lymph#: 1.2 10*3/uL (ref 0.9–3.3)

## 2015-10-12 LAB — COMPREHENSIVE METABOLIC PANEL
ALK PHOS: 84 U/L (ref 40–150)
ALT: 15 U/L (ref 0–55)
ANION GAP: 11 meq/L (ref 3–11)
AST: 20 U/L (ref 5–34)
Albumin: 3.6 g/dL (ref 3.5–5.0)
BUN: 6 mg/dL — ABNORMAL LOW (ref 7.0–26.0)
CO2: 21 meq/L — AB (ref 22–29)
Calcium: 9.4 mg/dL (ref 8.4–10.4)
Chloride: 109 mEq/L (ref 98–109)
Creatinine: 1.1 mg/dL (ref 0.6–1.1)
EGFR: 71 mL/min/{1.73_m2} — AB (ref >90)
Glucose: 122 mg/dl (ref 70–140)
Potassium: 3.4 mEq/L — ABNORMAL LOW (ref 3.5–5.1)
Sodium: 140 mEq/L (ref 136–145)
TOTAL PROTEIN: 7.7 g/dL (ref 6.4–8.3)

## 2015-10-12 LAB — TSH: TSH: 1.122 m[IU]/L (ref 0.308–3.960)

## 2015-10-12 MED ORDER — HEPARIN SOD (PORK) LOCK FLUSH 100 UNIT/ML IV SOLN
500.0000 [IU] | Freq: Once | INTRAVENOUS | Status: AC | PRN
  Administered 2015-10-12: 500 [IU]
  Filled 2015-10-12: qty 5

## 2015-10-12 MED ORDER — ATEZOLIZUMAB CHEMO INJECTION 1200 MG/20ML
1200.0000 mg | Freq: Once | INTRAVENOUS | Status: AC
  Administered 2015-10-12: 1200 mg via INTRAVENOUS
  Filled 2015-10-12: qty 20

## 2015-10-12 MED ORDER — SODIUM CHLORIDE 0.9 % IV SOLN
Freq: Once | INTRAVENOUS | Status: AC
  Administered 2015-10-12: 10:00:00 via INTRAVENOUS

## 2015-10-12 MED ORDER — SODIUM CHLORIDE 0.9% FLUSH
10.0000 mL | INTRAVENOUS | Status: DC | PRN
  Administered 2015-10-12: 10 mL
  Filled 2015-10-12: qty 10

## 2015-10-12 MED ORDER — OXYCODONE-ACETAMINOPHEN 10-325 MG PO TABS: 1.0000 | ORAL_TABLET | ORAL | Status: DC | PRN

## 2015-10-12 MED ORDER — SODIUM CHLORIDE 0.9 % IJ SOLN
10.0000 mL | Freq: Once | INTRAMUSCULAR | Status: AC
  Administered 2015-10-12: 10 mL
  Filled 2015-10-12: qty 10

## 2015-10-12 MED FILL — OXYCODONE-APAP 10-325 TAB: 10-325 | 10 days supply | Qty: 60 | Fill #0

## 2015-10-12 MED FILL — LIDOCAINE-PRILOCAINE CREAM: 2.5-2.5 | 30 days supply | Qty: 30 | Fill #3

## 2015-10-12 MED FILL — FOLIC ACID 1 MG TABLET: 1 | 30 days supply | Qty: 30 | Fill #4

## 2015-10-12 NOTE — Patient Instructions (Signed)
[facility] Discharge Instructions for Patients Receiving Chemotherapy  Today you received the following chemotherapy agents: Tecentriq  To help prevent nausea and vomiting after your treatment, we encourage you to take your nausea medication as directed.    If you develop nausea and vomiting that is not controlled by your nausea medication, call the clinic.   BELOW ARE SYMPTOMS THAT SHOULD BE REPORTED IMMEDIATELY:  *FEVER GREATER THAN 100.5 F  *CHILLS WITH OR WITHOUT FEVER  NAUSEA AND VOMITING THAT IS NOT CONTROLLED WITH YOUR NAUSEA MEDICATION  *UNUSUAL SHORTNESS OF BREATH  *UNUSUAL BRUISING OR BLEEDING  TENDERNESS IN MOUTH AND THROAT WITH OR WITHOUT PRESENCE OF ULCERS  *URINARY PROBLEMS  *BOWEL PROBLEMS  UNUSUAL RASH Items with * indicate a potential emergency and should be followed up as soon as possible.  Feel free to call the clinic you have any questions or concerns. The clinic phone number is [phone].  Please show the CHEMO ALERT CARD at check-in to the Emergency Department and triage nurse.   

## 2015-10-12 NOTE — Progress Notes (Signed)
Dr. Julien Nordmann notified of [place] 1.3. Advised ok to treat.

## 2015-10-12 NOTE — Progress Notes (Unsigned)
rx given to pt in infusion .

## 2015-10-13 ENCOUNTER — Telehealth: Payer: Self-pay | Admitting: Medical Oncology

## 2015-10-13 ENCOUNTER — Telehealth: Payer: Self-pay | Admitting: *Deleted

## 2015-10-13 NOTE — Telephone Encounter (Signed)
TC from patient this morning stating that she feels like she did well with new chemo yesterday and continues to feel ok today. No issues verbalized. Pt knows to call with any questions or concerns.

## 2015-10-13 NOTE — Telephone Encounter (Signed)
TC from patient - returning call to Dr. Worthy Flank nurse regarding 1st time chemo. Pt states she is feeling well. Only a little bit of nausea during treatment but none since she returned home.  Denies any other ill effects from chemo at this time.

## 2015-10-13 NOTE — Telephone Encounter (Signed)
-----   Message from Sinda Du, RN sent at 10/12/2015 12:38 PM EDT ----- Regarding: Dr. Julien Nordmann - new therapy f/u Patient received Tecentriq for first time today. Tolerated well.

## 2015-10-13 NOTE — Telephone Encounter (Signed)
I left a message for pt to call and let us know how she tolerated her tecentriq.

## 2015-10-22 ENCOUNTER — Telehealth: Payer: Self-pay | Admitting: Medical Oncology

## 2015-10-22 ENCOUNTER — Encounter: Payer: Self-pay | Admitting: Internal Medicine

## 2015-10-22 ENCOUNTER — Other Ambulatory Visit: Payer: Self-pay | Admitting: Internal Medicine

## 2015-10-22 ENCOUNTER — Telehealth: Payer: Self-pay | Admitting: *Deleted

## 2015-10-22 ENCOUNTER — Ambulatory Visit (INDEPENDENT_AMBULATORY_CARE_PROVIDER_SITE_OTHER): Payer: Medicaid Other | Admitting: Internal Medicine

## 2015-10-22 ENCOUNTER — Other Ambulatory Visit (HOSPITAL_COMMUNITY)
Admission: RE | Admit: 2015-10-22 | Discharge: 2015-10-22 | Disposition: A | Discharge status: Home / Self Care | Payer: Medicaid Other | Source: Ambulatory Visit | Attending: Oncology | Admitting: Oncology

## 2015-10-22 VITALS — BP 107/81 | HR 104 | Temp 98.5°F | Ht 66.0 in | Wt 178.6 lb

## 2015-10-22 DIAGNOSIS — Z124 Encounter for screening for malignant neoplasm of cervix: Secondary | ICD-10-CM | POA: Diagnosis present

## 2015-10-22 DIAGNOSIS — Z01419 Encounter for gynecological examination (general) (routine) without abnormal findings: Secondary | ICD-10-CM | POA: Insufficient documentation

## 2015-10-22 DIAGNOSIS — C3490 Malignant neoplasm of unspecified part of unspecified bronchus or lung: Secondary | ICD-10-CM

## 2015-10-22 DIAGNOSIS — Z0000 Encounter for general adult medical examination without abnormal findings: Secondary | ICD-10-CM | POA: Insufficient documentation

## 2015-10-22 DIAGNOSIS — Z1151 Encounter for screening for human papillomavirus (HPV): Secondary | ICD-10-CM | POA: Insufficient documentation

## 2015-10-22 DIAGNOSIS — Z1231 Encounter for screening mammogram for malignant neoplasm of breast: Secondary | ICD-10-CM

## 2015-10-22 NOTE — Assessment & Plan Note (Signed)
Patient came in today specifically to get a PAP smear. She is being managed for stage 4 lung cancer, she is presently on-  Immunotherapy treatment with Tecentriq (Atezolizumab) 1200 MG IV every 3 weeks. She got her first dose- 10/13/15.  PRIOR THERAPY:  1) Status post palliative radiotherapy to the soft tissue mass in the left iliac bone under the care of Dr. Valere Dross. 2) Systemic chemotherapy with carboplatin for AUC of 5 and Alimta 500 MG/M2 every 3 weeks. First dose expected on 02/22/2015. Status post 6 cycles. Starting from cycle #3 Alimta was reduced to 400 MG/M2 secondary to neutropenia after the first 2 cycles. 3) Maintenance systemic chemotherapy with single agent Alimta 500 MG/M2 every 3 weeks. First dose 07/19/2015. Status post 3 cycles. Last dose was given 08/30/2015 discontinued secondary to disease progression.  Considering this history, patients prognosis is grim. She is unlikely to benefit from continuing with routine screening exam. I did her pap smear, as this was the reason she came to clinic today, specifically to see a female provider, rather than her PCP who is female. She is also requesting a mammogram. Pt is young. She will need to have a discussion about the benefit of screening exams on her next visit.

## 2015-10-22 NOTE — Patient Instructions (Addendum)
It was nice seeing you today. Please follow up with your PCP in 3 months. We will also put ina referral for your mammogram.

## 2015-10-22 NOTE — Progress Notes (Signed)
Patient ID: Andrea [name], female   DOB: 10/17/1970, 45 y.o.   MRN: [id number]   CC:  HPI: [name] is a 45 y.o. with PMH - Non Small cell ling cancer stage 4. Pt is here today to get a PAP smear. She specifically made this appointment for this.   Past Medical History  Diagnosis Date  . Low back pain radiating to left leg 11/29/2010  . Dysmenorrhea 12/06/2009    Qualifier: Diagnosis of  By: [name] RN, Regino Schultze    . Chronic back pain   . Family history of adverse reaction to anesthesia     mother stopped breathing after surgery  . Anxiety   . Depression   . Headache     stress  . S/P radiation therapy 01/01/2015 through 01/21/2015      Left iliac bone 3500 cGy in 14 sessions   . Shortness of breath dyspnea     with exertion, Pt denies 02/2015  . Anginal pain ([place])     chest pain not sure what related to  . Non-small cell carcinoma of right lung, stage 4 ([place]) 01/01/2015  . 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 Encounter for antineoplastic immunotherapy 10/07/2015   Review of Systems: CONSTITUTIONAL- No Fever, or change in appetite. SKIN- No Rash, colour changes or itching. HEAD- No Headache or dizziness. RESPIRATORY- No Cough or SOB. CARDIAC- No Palpitations, or chest pain. GI- No vomiting, diarrhoea, abd pain. URINARY- No Frequency, or dysuria. NEUROLOGIC- No Numbness, or burning.  Physical Exam: Filed Vitals:   10/22/15 0957  BP: 107/81  Pulse: 104  Temp: 98.5 F (36.9 C)  TempSrc: Oral  Height: '5\' 6"'$  (1.676 m)  Weight: 178 lb 9.6 oz (81.012 kg)  SpO2: 100%   GENERAL- alert, co-operative, appears as stated age, not in any distress. HEENT- Atraumatic, normocephalic,  CARDIAC- Regular ,rate and rhythm RESP- Moving equal volumes of air,no wheezes or crackles. ABDOMEN- Soft, nontender NEURO- No obvious Cr N abnormality, Gait- Normal. EXTREMITIES- pulse 2+, symmetric, no pedal edema. SKIN-  Warm, dry, No rash or lesion. PSYCH- Normal mood and affect, appropriate thought content and speech.  Assessment & Plan:  See encounters tab for problem based medical decision making. Patient discussed with Dr. Beryle Beams.

## 2015-10-22 NOTE — Telephone Encounter (Signed)
Call to patient  Given appointment for Mammogram on 10/28/2015 at 11:20 AM at the [facility]. Patient was advised to not use perfume, powders or deodorant on day of test.  Patient voiced understanding of.  Sander Nephew, RN 10/22/2015 11:42 AM.

## 2015-10-22 NOTE — Telephone Encounter (Signed)
Today she calls and reports that " I stay cold all the time. I have to stay under a blanket and when I get out from under the blanket I freeze". She denies fever, chills or any other symptoms. On 6/20 pt received new immunotherapy drug Tecentriq with ANC 1.3   Per Cyndee bacon , I instructed pt to go to ED now for evaluation. Andrea [name] stated she would go.

## 2015-10-22 NOTE — Progress Notes (Signed)
Medicine attending: Medical history, presenting problems, physical findings, and medications, reviewed with resident physician Dr Denton Brick on the day of the patient visit and I concur with her evaluation and management plan.

## 2015-10-25 ENCOUNTER — Other Ambulatory Visit (HOSPITAL_BASED_OUTPATIENT_CLINIC_OR_DEPARTMENT_OTHER): Payer: Medicaid Other

## 2015-10-25 ENCOUNTER — Other Ambulatory Visit: Payer: Self-pay

## 2015-10-25 ENCOUNTER — Telehealth: Payer: Self-pay | Admitting: Nurse Practitioner

## 2015-10-25 ENCOUNTER — Encounter: Payer: Self-pay | Admitting: Nurse Practitioner

## 2015-10-25 ENCOUNTER — Ambulatory Visit (HOSPITAL_BASED_OUTPATIENT_CLINIC_OR_DEPARTMENT_OTHER): Payer: Medicaid Other | Admitting: Nurse Practitioner

## 2015-10-25 VITALS — BP 108/76 | HR 94 | Temp 97.6°F | Resp 18 | Ht 66.0 in | Wt 176.0 lb

## 2015-10-25 DIAGNOSIS — R634 Abnormal weight loss: Secondary | ICD-10-CM

## 2015-10-25 DIAGNOSIS — R6883 Chills (without fever): Secondary | ICD-10-CM | POA: Diagnosis present

## 2015-10-25 DIAGNOSIS — C3491 Malignant neoplasm of unspecified part of right bronchus or lung: Secondary | ICD-10-CM

## 2015-10-25 LAB — DRAW EXTRA CLOT TUBE

## 2015-10-25 LAB — CBC WITH DIFFERENTIAL/PLATELET
BASO%: 1 % (ref 0.0–2.0)
Basophils Absolute: 0 10*3/uL (ref 0.0–0.1)
EOS ABS: 0 10*3/uL (ref 0.0–0.5)
EOS%: 1.2 % (ref 0.0–7.0)
HCT: 40.6 % (ref 34.8–46.6)
HGB: 13.8 g/dL (ref 11.6–15.9)
LYMPH%: 38.9 % (ref 14.0–49.7)
MCH: 34 pg (ref 25.1–34.0)
MCHC: 34 g/dL (ref 31.5–36.0)
MCV: 100 fL (ref 79.5–101.0)
MONO#: 0.4 10*3/uL (ref 0.1–0.9)
MONO%: 12.2 % (ref 0.0–14.0)
NEUT#: 1.4 10*3/uL — ABNORMAL LOW (ref 1.5–6.5)
NEUT%: 46.7 % (ref 38.4–76.8)
PLATELETS: 282 10*3/uL (ref 145–400)
RBC: 4.06 10*6/uL (ref 3.70–5.45)
RDW: 13.5 % (ref 11.2–14.5)
WBC: 2.9 10*3/uL — ABNORMAL LOW (ref 3.9–10.3)
lymph#: 1.1 10*3/uL (ref 0.9–3.3)

## 2015-10-25 NOTE — Assessment & Plan Note (Signed)
Patient received cycle 1 of her Tecentriq immunotherapy on 10/12/2015.  The immunotherapy infusion will be cycled on an every three-week basis.  Patient just met with her internal medicine physician on Friday, 10/22/2015 and underwent a Pap smear exam.  She is awaiting the results of the Pap smear at this time.  She is also scheduled for a mammogram on 10/28/2015.  She is scheduled to return for labs, flush, visit, and immunotherapy on 11/01/2015.

## 2015-10-25 NOTE — Assessment & Plan Note (Signed)
Patient reports that she developed some chills immediately following her first immunotherapy on 10/12/2015.  Since that time she had no other new complaints.  However, at the end of last week.  Patient did experience some chills once again.  She denies any fever at that time.   She was seen by her primary care physician for a Pap smear; and vital signs at the time revealed the patient was afebrile as well.  Patient states that her chills completely resolved this past Sunday, 10/24/2015.  She has no other complaints whatsoever.  She denies any URI or UTI symptoms.  She denies any cough.  Labs obtained today reveal a WBC of 2.9, ANC 1.4, hemoglobin 13.8, platelet count 282.  Vital signs were stable and patient was afebrile with temperature 97.6.  Reviewed all findings with Dr. Julien Nordmann; and was advised that pyrexia and associated chills can be a side effect of immunotherapy.   Patient states that she feels fine today; has no complaints whatsoever.  Patient appears nontoxic and in very good spirits.  Patient was advised to call/return or go directly to the emergency department for any worsening symptoms  whatsoever.

## 2015-10-25 NOTE — Progress Notes (Signed)
SYMPTOM MANAGEMENT CLINIC    Chief Complaint: Chills  HPI:  Andrea [name] 45 y.o. female diagnosed with lung cancer.  Currently undergoing Tecentriq immunotherapy.  Patient is status post chemotherapy and radiation treatments in the past.    Patient reports that she developed some chills immediately following her first immunotherapy on 10/12/2015.  Since that time she had no other new complaints.  However, at the end of last week.  Patient did experience some chills once again.  She denies any fever at that time.   She was seen by her primary care physician for a Pap smear; and vital signs at the time revealed the patient was afebrile as well.  Patient states that her chills completely resolved this past Sunday, 10/24/2015.  She has no other complaints whatsoever.  She denies any URI or UTI symptoms.  She denies any cough.  Labs obtained today reveal a WBC of 2.9, ANC 1.4, hemoglobin 13.8, platelet count 282.  Vital signs were stable and patient was afebrile with temperature 97.6.  Reviewed all findings with Dr. Julien Nordmann; and was advised that pyrexia and associated chills can be a side effect of immunotherapy.   Patient states that she feels fine today; has no complaints whatsoever.  Patient appears nontoxic and in very good spirits.  Patient was advised to call/return or go directly to the emergency department for any worsening symptoms  whatsoever.    No history exists.    Review of Systems  All other systems reviewed and are negative.   Past Medical History  Diagnosis Date  . Low back pain radiating to left leg 11/29/2010  . Dysmenorrhea 12/06/2009    Qualifier: Diagnosis of  By: [name] RN, Regino Schultze    . Chronic back pain   . Family history of adverse reaction to anesthesia     mother stopped breathing after surgery  . Anxiety   . Depression   . Headache     stress  . S/P radiation therapy 01/01/2015 through 01/21/2015     Left iliac bone 3500 cGy in 14 sessions   . Shortness of breath dyspnea     with exertion, Pt denies 02/2015  . Anginal pain ([place])     chest pain not sure what related to  . Non-small cell carcinoma of right lung, stage 4 ([place]) 01/01/2015  . Adrenal mass, left ([place]) 09/21/2015  . Encounter for antineoplastic immunotherapy 10/07/2015    Past Surgical History  Procedure Laterality Date  . Laproscopy surgery to check her tubes    . Lumbar laminectomy/decompression microdiscectomy Left 05/01/2014    Procedure: Microdiscectomy - L5-S1 - left;  Surgeon: Eustace Moore, MD;  Location: [facility];  Service: Neurosurgery;  Laterality: Left;    has Dysmenorrhea; Degenerative disc disease, lumbar; Tobacco abuse; Depo-Provera contraceptive status; Depression; Annular disc tear; Tachycardia; S/P lumbar laminectomy; Lumbar disc prolapse with compression radiculopathy; Abnormal MRI; Bone mass; Uncontrolled pain; Mass of left hip region; Hypokalemia; Bone metastases ([place]); Non-small cell carcinoma of right lung, stage 4 ([place]); Malnutrition ([place]); Cancer associated pain; Encounter for antineoplastic chemotherapy; Port catheter in place; Adrenal mass, left ([place]); Encounter for antineoplastic immunotherapy; Chronic fatigue; Encounter for preventive health examination; Chills (without fever); and Unintentional weight loss on her problem list.    is allergic to morphine and related and hydrocodone.    Medication List       This list is accurate as of: 10/25/15 10:18 AM.  Always use your most recent med list.  cyanocobalamin 1000 MCG/ML injection  Commonly known as:  (VITAMIN B-12)  Inject 1,000 mcg into the muscle once.     dexamethasone 4 MG tablet  Commonly known as:  DECADRON  4 mg po bid, the day before, day of and day after chemo     feeding supplement (ENSURE ENLIVE) Liqd  Take 237 mLs by mouth 3 (three) times daily between meals.     folic acid 1 MG tablet  Commonly  known as:  FOLVITE  Take 1 tablet (1 mg total) by mouth daily.     guaiFENesin-dextromethorphan 100-10 MG/5ML syrup  Commonly known as:  ROBITUSSIN DM  Take 5 mLs by mouth every 4 (four) hours as needed for cough. Reported on 10/25/2015     lidocaine-prilocaine cream  Commonly known as:  EMLA  Apply 1 teaspoon to skin 1 to 2 hours before use cover to keep in contact with skin     loratadine-pseudoephedrine 10-240 MG 24 hr tablet  Commonly known as:  CLARITIN-D 24-hour  Take 1 tablet by mouth daily.     MULTIVITAMIN & MINERAL PO  Take 2 tablets by mouth daily.     oxyCODONE-acetaminophen 10-325 MG tablet  Commonly known as:  PERCOCET  Take 1 tablet by mouth every 4 (four) hours as needed for pain.     PRENATAL VITAMIN PO  Take 1 tablet by mouth daily.     prochlorperazine 10 MG tablet  Commonly known as:  COMPAZINE  Take 1 tablet (10 mg total) by mouth every 6 (six) hours as needed for nausea or vomiting.     senna-docusate 8.6-50 MG tablet  Commonly known as:  Senokot-S  Take 2 tablets by mouth at bedtime.         PHYSICAL EXAMINATION  Oncology Vitals 10/25/2015 10/22/2015  Height 168 cm 168 cm  Weight 79.833 kg 81.012 kg  Weight (lbs) 176 lbs 178 lbs 10 oz  BMI (kg/m2) 28.41 kg/m2 28.83 kg/m2  Temp 97.6 98.5  Pulse 94 104  Resp 18 -  SpO2 100 100  BSA (m2) 1.93 m2 1.94 m2   BP Readings from Last 2 Encounters:  10/25/15 108/76  10/22/15 107/81    Physical Exam  Constitutional: She is oriented to person, place, and time and well-developed, well-nourished, and in no distress.  HENT:  Head: Normocephalic and atraumatic.  Mouth/Throat: Oropharynx is clear and moist.  Eyes: Conjunctivae and EOM are normal. Pupils are equal, round, and reactive to light. Right eye exhibits no discharge. Left eye exhibits no discharge. No scleral icterus.  Neck: Normal range of motion.  Pulmonary/Chest: Effort normal. No respiratory distress.  Musculoskeletal: Normal range of motion.   Neurological: She is alert and oriented to person, place, and time. Gait normal.  Psychiatric: Affect normal.  Nursing note and vitals reviewed.   LABORATORY DATA:. Appointment on 10/25/2015  Component Date Value Ref Range Status  . WBC 10/25/2015 2.9* 3.9 - 10.3 10e3/uL Final  . NEUT# 10/25/2015 1.4* 1.5 - 6.5 10e3/uL Final  . HGB 10/25/2015 13.8  11.6 - 15.9 g/dL Final  . HCT 10/25/2015 40.6  34.8 - 46.6 % Final  . Platelets 10/25/2015 282  145 - 400 10e3/uL Final  . MCV 10/25/2015 100.0  79.5 - 101.0 fL Final  . MCH 10/25/2015 34.0  25.1 - 34.0 pg Final  . MCHC 10/25/2015 34.0  31.5 - 36.0 g/dL Final  . RBC 10/25/2015 4.06  3.70 - 5.45 10e6/uL Final  . RDW 10/25/2015 13.5  11.2 -  14.5 % Final  . lymph# 10/25/2015 1.1  0.9 - 3.3 10e3/uL Final  . MONO# 10/25/2015 0.4  0.1 - 0.9 10e3/uL Final  . Eosinophils Absolute 10/25/2015 0.0  0.0 - 0.5 10e3/uL Final  . Basophils Absolute 10/25/2015 0.0  0.0 - 0.1 10e3/uL Final  . NEUT% 10/25/2015 46.7  38.4 - 76.8 % Final  . LYMPH% 10/25/2015 38.9  14.0 - 49.7 % Final  . MONO% 10/25/2015 12.2  0.0 - 14.0 % Final  . EOS% 10/25/2015 1.2  0.0 - 7.0 % Final  . BASO% 10/25/2015 1.0  0.0 - 2.0 % Final  . Extra Tube 10/25/2015 Sample held in lab for 7 days   Final    RADIOGRAPHIC STUDIES: No results found.  ASSESSMENT/PLAN:    Non-small cell carcinoma of right lung, stage 4 ([facility]) Patient received cycle 1 of her Tecentriq immunotherapy on 10/12/2015.  The immunotherapy infusion will be cycled on an every three-week basis.  Patient just met with her internal medicine physician on Friday, 10/22/2015 and underwent a Pap smear exam.  She is awaiting the results of the Pap smear at this time.  She is also scheduled for a mammogram on 10/28/2015.  She is scheduled to return for labs, flush, visit, and immunotherapy on 11/01/2015.  Chills (without fever) Patient reports that she developed some chills immediately following her first immunotherapy on  10/12/2015.  Since that time she had no other new complaints.  However, at the end of last week.  Patient did experience some chills once again.  She denies any fever at that time.   She was seen by her primary care physician for a Pap smear; and vital signs at the time revealed the patient was afebrile as well.  Patient states that her chills completely resolved this past Sunday, 10/24/2015.  She has no other complaints whatsoever.  She denies any URI or UTI symptoms.  She denies any cough.  Labs obtained today reveal a WBC of 2.9, ANC 1.4, hemoglobin 13.8, platelet count 282.  Vital signs were stable and patient was afebrile with temperature 97.6.  Reviewed all findings with Dr. Julien Nordmann; and was advised that pyrexia and associated chills can be a side effect of immunotherapy.   Patient states that she feels fine today; has no complaints whatsoever.  Patient appears nontoxic and in very good spirits.  Patient was advised to call/return or go directly to the emergency department for any worsening symptoms  whatsoever.   Unintentional weight loss Patient states that she has lost approximately 10 pounds since she started her new immunotherapy treatments.  She states that she has had decreased appetite as well.  Patient was encouraged to push protein in her diet and eat multiple small meals throughout the day at all possible.   Patient stated understanding of all instructions; and was in agreement with this plan of care. The patient knows to call the clinic with any problems, questions or concerns.   Total time spent with patient was 15 minutes;  with greater than 75 percent of that time spent in face to face counseling regarding patient's symptoms,  and coordination of care and follow up.  Disclaimer:This dictation was prepared with Dragon/digital dictation along with [employer]. Any transcriptional errors that result from this process are unintentional.  Drue Second,  NP 10/25/2015

## 2015-10-25 NOTE — Assessment & Plan Note (Signed)
Patient states that she has lost approximately 10 pounds since she started her new immunotherapy treatments.  She states that she has had decreased appetite as well.  Patient was encouraged to push protein in her diet and eat multiple small meals throughout the day at all possible.

## 2015-10-25 NOTE — Telephone Encounter (Signed)
Pt sched next available

## 2015-10-28 ENCOUNTER — Ambulatory Visit
Admission: RE | Admit: 2015-10-28 | Discharge: 2015-10-28 | Disposition: A | Discharge status: Home / Self Care | Payer: Medicaid Other | Source: Ambulatory Visit | Attending: Oncology | Admitting: Oncology

## 2015-10-28 DIAGNOSIS — Z1231 Encounter for screening mammogram for malignant neoplasm of breast: Secondary | ICD-10-CM

## 2015-10-28 LAB — CYTOLOGY - PAP

## 2015-10-29 ENCOUNTER — Telehealth: Payer: Self-pay | Admitting: Internal Medicine

## 2015-10-29 ENCOUNTER — Other Ambulatory Visit: Payer: Self-pay | Admitting: *Deleted

## 2015-10-29 ENCOUNTER — Encounter: Payer: Self-pay | Admitting: Student in an Organized Health Care Education/Training Program

## 2015-10-29 DIAGNOSIS — R928 Other abnormal and inconclusive findings on diagnostic imaging of breast: Secondary | ICD-10-CM

## 2015-10-29 DIAGNOSIS — C3491 Malignant neoplasm of unspecified part of right bronchus or lung: Secondary | ICD-10-CM

## 2015-10-29 DIAGNOSIS — R921 Mammographic calcification found on diagnostic imaging of breast: Secondary | ICD-10-CM

## 2015-10-29 NOTE — Telephone Encounter (Signed)
Placed order for diagnostic mammogram of left breast due to calcifications of screening mammogram. I called Elmyra Ricks and let her know the findings and that she will be called back for a repeat mammogram.

## 2015-11-01 ENCOUNTER — Other Ambulatory Visit (HOSPITAL_BASED_OUTPATIENT_CLINIC_OR_DEPARTMENT_OTHER): Payer: Medicaid Other

## 2015-11-01 ENCOUNTER — Telehealth: Payer: Self-pay | Admitting: Internal Medicine

## 2015-11-01 ENCOUNTER — Other Ambulatory Visit: Payer: Self-pay | Admitting: Medical Oncology

## 2015-11-01 ENCOUNTER — Encounter: Payer: Self-pay | Admitting: *Deleted

## 2015-11-01 ENCOUNTER — Ambulatory Visit (HOSPITAL_BASED_OUTPATIENT_CLINIC_OR_DEPARTMENT_OTHER): Payer: Medicaid Other | Admitting: Internal Medicine

## 2015-11-01 ENCOUNTER — Ambulatory Visit: Payer: Medicaid Other

## 2015-11-01 ENCOUNTER — Encounter: Payer: Self-pay | Admitting: Internal Medicine

## 2015-11-01 ENCOUNTER — Ambulatory Visit (HOSPITAL_BASED_OUTPATIENT_CLINIC_OR_DEPARTMENT_OTHER): Payer: Medicaid Other

## 2015-11-01 VITALS — BP 103/72 | HR 90 | Temp 98.1°F | Resp 19 | Ht 66.0 in | Wt 177.3 lb

## 2015-11-01 DIAGNOSIS — Z95828 Presence of other vascular implants and grafts: Secondary | ICD-10-CM

## 2015-11-01 DIAGNOSIS — C7971 Secondary malignant neoplasm of right adrenal gland: Secondary | ICD-10-CM

## 2015-11-01 DIAGNOSIS — Z5112 Encounter for antineoplastic immunotherapy: Secondary | ICD-10-CM

## 2015-11-01 DIAGNOSIS — C3491 Malignant neoplasm of unspecified part of right bronchus or lung: Secondary | ICD-10-CM

## 2015-11-01 DIAGNOSIS — C7951 Secondary malignant neoplasm of bone: Secondary | ICD-10-CM | POA: Diagnosis not present

## 2015-11-01 DIAGNOSIS — C778 Secondary and unspecified malignant neoplasm of lymph nodes of multiple regions: Secondary | ICD-10-CM

## 2015-11-01 DIAGNOSIS — C7972 Secondary malignant neoplasm of left adrenal gland: Secondary | ICD-10-CM | POA: Diagnosis not present

## 2015-11-01 DIAGNOSIS — M898X9 Other specified disorders of bone, unspecified site: Secondary | ICD-10-CM

## 2015-11-01 DIAGNOSIS — R5382 Chronic fatigue, unspecified: Secondary | ICD-10-CM

## 2015-11-01 LAB — CBC WITH DIFFERENTIAL/PLATELET
BASO%: 1.2 % (ref 0.0–2.0)
BASOS ABS: 0.1 10*3/uL (ref 0.0–0.1)
EOS%: 0.5 % (ref 0.0–7.0)
Eosinophils Absolute: 0 10*3/uL (ref 0.0–0.5)
HEMATOCRIT: 37.9 % (ref 34.8–46.6)
HGB: 12.7 g/dL (ref 11.6–15.9)
LYMPH#: 1.3 10*3/uL (ref 0.9–3.3)
LYMPH%: 27.3 % (ref 14.0–49.7)
MCH: 33.2 pg (ref 25.1–34.0)
MCHC: 33.5 g/dL (ref 31.5–36.0)
MCV: 99.1 fL (ref 79.5–101.0)
MONO#: 0.3 10*3/uL (ref 0.1–0.9)
MONO%: 6 % (ref 0.0–14.0)
NEUT#: 3.1 10*3/uL (ref 1.5–6.5)
NEUT%: 65 % (ref 38.4–76.8)
PLATELETS: 349 10*3/uL (ref 145–400)
RBC: 3.83 10*6/uL (ref 3.70–5.45)
RDW: 13.9 % (ref 11.2–14.5)
WBC: 4.7 10*3/uL (ref 3.9–10.3)

## 2015-11-01 LAB — COMPREHENSIVE METABOLIC PANEL
ALT: 11 U/L (ref 0–55)
ANION GAP: 9 meq/L (ref 3–11)
AST: 18 U/L (ref 5–34)
Albumin: 3.6 g/dL (ref 3.5–5.0)
Alkaline Phosphatase: 98 U/L (ref 40–150)
BUN: 8.7 mg/dL (ref 7.0–26.0)
CALCIUM: 9.8 mg/dL (ref 8.4–10.4)
CHLORIDE: 109 meq/L (ref 98–109)
CO2: 22 mEq/L (ref 22–29)
CREATININE: 1 mg/dL (ref 0.6–1.1)
EGFR: 84 mL/min/{1.73_m2} — ABNORMAL LOW (ref >90)
Glucose: 99 mg/dl (ref 70–140)
POTASSIUM: 4.1 meq/L (ref 3.5–5.1)
Sodium: 141 mEq/L (ref 136–145)
Total Bilirubin: 0.36 mg/dL (ref 0.20–1.20)
Total Protein: 8.2 g/dL (ref 6.4–8.3)

## 2015-11-01 LAB — TSH: TSH: 0.958 m(IU)/L (ref 0.308–3.960)

## 2015-11-01 MED ORDER — ALTEPLASE 2 MG IJ SOLR
2.0000 mg | Freq: Once | INTRAMUSCULAR | Status: DC | PRN
  Filled 2015-11-01: qty 2

## 2015-11-01 MED ORDER — DENOSUMAB 120 MG/1.7ML ~~LOC~~ SOLN
120.0000 mg | Freq: Once | SUBCUTANEOUS | Status: AC
  Administered 2015-11-01: 120 mg via SUBCUTANEOUS
  Filled 2015-11-01: qty 1.7

## 2015-11-01 MED ORDER — HEPARIN SOD (PORK) LOCK FLUSH 100 UNIT/ML IV SOLN
500.0000 [IU] | Freq: Once | INTRAVENOUS | Status: AC | PRN
  Administered 2015-11-01: 500 [IU] via INTRAVENOUS
  Filled 2015-11-01: qty 5

## 2015-11-01 MED ORDER — SODIUM CHLORIDE 0.9 % IV SOLN
Freq: Once | INTRAVENOUS | Status: AC
  Administered 2015-11-01: 09:00:00 via INTRAVENOUS

## 2015-11-01 MED ORDER — ATEZOLIZUMAB CHEMO INJECTION 1200 MG/20ML
1200.0000 mg | Freq: Once | INTRAVENOUS | Status: AC
  Administered 2015-11-01: 1200 mg via INTRAVENOUS
  Filled 2015-11-01: qty 20

## 2015-11-01 MED ORDER — SODIUM CHLORIDE 0.9% FLUSH
10.0000 mL | INTRAVENOUS | Status: DC | PRN
  Administered 2015-11-01: 10 mL
  Filled 2015-11-01: qty 10

## 2015-11-01 MED ORDER — HEPARIN SOD (PORK) LOCK FLUSH 100 UNIT/ML IV SOLN
500.0000 [IU] | Freq: Once | INTRAVENOUS | Status: DC | PRN
  Filled 2015-11-01: qty 5

## 2015-11-01 MED ORDER — SODIUM CHLORIDE 0.9% FLUSH
10.0000 mL | INTRAVENOUS | Status: DC | PRN
  Administered 2015-11-01: 10 mL via INTRAVENOUS
  Filled 2015-11-01: qty 10

## 2015-11-01 NOTE — Progress Notes (Signed)
Oncology Nurse Navigator Documentation  Oncology Nurse Navigator Flowsheets 11/01/2015  Navigator Location [facility] On[facility]  Navigator Encounter Type Clinic/MDC  Patient Visit Type MedOnc  Treatment Phase Treatment  Barriers/Navigation Needs Coordination of Care  Interventions Coordination of Care  Coordination of Care Other  Acuity Level 1  Time Spent with Patient 30   Spoke with patient today at [facility].  She is unclear of dental clearance for Xgeva.  Clearance documented in EPIC.

## 2015-11-01 NOTE — Progress Notes (Signed)
[facility] [phone]   [phone]  OFFICE PROGRESS NOTE  [name], MD [place] [address]  DIAGNOSIS: Stage IV (T1a, N2, M1b) non-small cell lung cancer, adenocarcinoma with negative EGFR mutation diagnosed in August 2016 and presented with right hilar and subcarinal lymphadenopathy as well as large soft tissue mass in the left posterior iliac bone.  PRIOR THERAPY:  1) Status post palliative radiotherapy to the soft tissue mass in the left iliac bone under the care of Dr. Valere Dross. 2) Systemic chemotherapy with carboplatin for AUC of 5 and Alimta 500 MG/M2 every 3 weeks. First dose expected on 02/22/2015. Status post 6 cycles. Starting from cycle #3 Alimta was reduced to 400 MG/M2 secondary to neutropenia after the first 2 cycles. 3) Maintenance systemic chemotherapy with single agent Alimta 500 MG/M2 every 3 weeks. First dose 07/19/2015. Status post 3 cycles. Last dose was given 08/30/2015 discontinued secondary to disease progression.  CURRENT THERAPY: Immunotherapy treatment with Tecentriq (Atezolizumab) 1200 MG IV every 3 weeks, for his dose 10/14/2015. Status post one cycle.  INTERVAL HISTORY: Andrea [name] 45 y.o. female returns to the clinic today for follow-up visit. The patient is feeling fine today with no specific complaints. She tolerated the first cycle of immunotherapy with Tecentriq Huey Bienenstock) fairly well with no significant complaints. She denied having any significant skin rash or diarrhea. She denied having any significant chest pain, shortness of breath, cough or hemoptysis. She denied having any fever or chills. She has no nausea or vomiting. She denied having any significant weight loss or night sweats. She is here today to start cycle #2.  MEDICAL HISTORY: Past Medical History  Diagnosis Date  . Low back pain radiating to left leg 11/29/2010  . Dysmenorrhea 12/06/2009    Qualifier: Diagnosis of  By: [name]  RN, Regino Schultze    . Chronic back pain   . Family history of adverse reaction to anesthesia     mother stopped breathing after surgery  . Anxiety   . Depression   . Headache     stress  . S/P radiation therapy 01/01/2015 through 01/21/2015      Left iliac bone 3500 cGy in 14 sessions   . Shortness of breath dyspnea     with exertion, Pt denies 02/2015  . Anginal pain ([place])     chest pain not sure what related to  . Non-small cell carcinoma of right lung, stage 4 ([place]) 01/01/2015  . Adrenal mass, left ([place]) 09/21/2015  . Encounter for antineoplastic immunotherapy 10/07/2015    ALLERGIES:  is allergic to morphine and related and hydrocodone.  MEDICATIONS:  Current Outpatient Prescriptions  Medication Sig Dispense Refill  . cyanocobalamin (,VITAMIN B-12,) 1000 MCG/ML injection Inject 1,000 mcg into the muscle once.    [name] dexamethasone (DECADRON) 4 MG tablet 4 mg po bid, the day before, day of and day after chemo 40 tablet 1  . folic acid (FOLVITE) 1 MG tablet Take 1 tablet (1 mg total) by mouth daily. 30 tablet 4  . guaiFENesin-dextromethorphan (ROBITUSSIN DM) 100-10 MG/5ML syrup Take 5 mLs by mouth every 4 (four) hours as needed for cough. Reported on 10/25/2015    . lidocaine-prilocaine (EMLA) cream Apply 1 teaspoon to skin 1 to 2 hours before use cover to keep in contact with skin 30 g PRN  . loratadine-pseudoephedrine (CLARITIN-D 24-HOUR) 10-240 MG 24 hr tablet Take 1 tablet by mouth daily.    [name] oxyCODONE-acetaminophen (PERCOCET)  10-325 MG tablet Take 1 tablet by mouth every 4 (four) hours as needed for pain. 60 tablet 0  . senna-docusate (SENOKOT-S) 8.6-50 MG per tablet Take 2 tablets by mouth at bedtime. 30 tablet 0  . feeding supplement, ENSURE ENLIVE, (ENSURE ENLIVE) LIQD Take 237 mLs by mouth 3 (three) times daily between meals. (Patient not taking: Reported on 11/01/2015) 237 mL 12  . Multiple Vitamins-Minerals  (MULTIVITAMIN & MINERAL PO) Take 2 tablets by mouth daily. Reported on 11/01/2015    . Prenatal Vit-Fe Fumarate-FA (PRENATAL VITAMIN PO) Take 1 tablet by mouth daily. Reported on 11/01/2015    . prochlorperazine (COMPAZINE) 10 MG tablet Take 1 tablet (10 mg total) by mouth every 6 (six) hours as needed for nausea or vomiting. (Patient not taking: Reported on 11/01/2015) 60 tablet 0   No current facility-administered medications for this visit.    SURGICAL HISTORY:  Past Surgical History  Procedure Laterality Date  . Laproscopy surgery to check her tubes    . Lumbar laminectomy/decompression microdiscectomy Left 05/01/2014    Procedure: Microdiscectomy - L5-S1 - left;  Surgeon: Eustace Moore, MD;  Location: [facility];  Service: Neurosurgery;  Laterality: Left;    REVIEW OF SYSTEMS:  Constitutional: negative Eyes: negative Ears, nose, mouth, throat, and face: negative Respiratory: negative Cardiovascular: negative Gastrointestinal: negative Genitourinary:negative Integument/breast: negative Hematologic/lymphatic: negative Musculoskeletal:positive for back pain Neurological: negative Behavioral/Psych: negative Endocrine: negative Allergic/Immunologic: negative   PHYSICAL EXAMINATION: General appearance: alert, cooperative, fatigued and no distress Head: Normocephalic, without obvious abnormality, atraumatic Neck: no adenopathy, no JVD, supple, symmetrical, trachea midline and thyroid not enlarged, symmetric, no tenderness/mass/nodules Lymph nodes: Cervical, supraclavicular, and axillary nodes normal. Resp: clear to auscultation bilaterally Back: symmetric, no curvature. ROM normal. No CVA tenderness. Cardio: regular rate and rhythm, S1, S2 normal, no murmur, click, rub or gallop GI: soft, non-tender; bowel sounds normal; no masses,  no organomegaly Extremities: extremities normal, atraumatic, no cyanosis or edema Neurologic: Alert and oriented X 3, normal strength and tone. Normal  symmetric reflexes. Normal coordination and gait  ECOG PERFORMANCE STATUS: 1 - Symptomatic but completely ambulatory  Blood pressure 103/72, pulse 90, temperature 98.1 F (36.7 C), temperature source Oral, resp. rate 19, height 5' 6"  (1.676 m), weight 177 lb 4.8 oz (80.423 kg), SpO2 100 %.  LABORATORY DATA: Lab Results  Component Value Date   WBC 4.7 11/01/2015   HGB 12.7 11/01/2015   HCT 37.9 11/01/2015   MCV 99.1 11/01/2015   PLT 349 11/01/2015      Chemistry      Component Value Date/Time   NA 140 10/12/2015 0747   NA 141 03/08/2015 1100   K 3.4* 10/12/2015 0747   K 4.2 03/08/2015 1100   CL 111 03/08/2015 1100   CO2 21* 10/12/2015 0747   CO2 22 03/08/2015 1100   BUN 6.0* 10/12/2015 0747   BUN 8 03/08/2015 1100   CREATININE 1.1 10/12/2015 0747   CREATININE 0.77 03/08/2015 1100   CREATININE 0.85 01/08/2014 1637      Component Value Date/Time   CALCIUM 9.4 10/12/2015 0747   CALCIUM 9.5 03/08/2015 1100   ALKPHOS 84 10/12/2015 0747   ALKPHOS 74 04/20/2013 1320   AST 20 10/12/2015 0747   AST 17 04/20/2013 1320   ALT 15 10/12/2015 0747   ALT 14 04/20/2013 1320   BILITOT <0.30 10/12/2015 0747   BILITOT 0.5 04/20/2013 1320       RADIOGRAPHIC STUDIES: Mm Digital Screening Bilateral  10/29/2015  CLINICAL DATA:  Screening.  EXAM: DIGITAL SCREENING BILATERAL MAMMOGRAM WITH CAD COMPARISON:  None. ACR Breast Density Category c: The breast tissue is heterogeneously dense, which may obscure small masses. FINDINGS: In the left breast, calcifications warrant further evaluation. In the right breast, no findings suspicious for malignancy. Images were processed with CAD. IMPRESSION: Further evaluation is suggested for calcifications in the left breast. RECOMMENDATION: Diagnostic mammogram of the left breast. (Code:[id number]) The patient will be contacted regarding the findings, and additional imaging will be scheduled. BI-RADS CATEGORY  0: Incomplete. Need additional imaging evaluation  and/or prior mammograms for comparison. Electronically Signed   By: Evangeline Dakin M.D.   On: 10/29/2015 12:39    ASSESSMENT AND PLAN: This is a very pleasant 45 years old African-American female recently diagnosed with metastatic non-small cell lung cancer, adenocarcinoma with negative EGFR mutation status post palliative radiotherapy to the left iliac bone and soft tissue mass. She is currently undergoing systemic chemotherapy with carboplatin and Alimta status post 6 cycles.  She is currently undergoing maintenance chemotherapy with single agent Alimta status post 3 cycles. She tolerated the first 3 cycles well except for mild nausea. The restaging CT scan of the chest, abdomen and pelvis showed stable disease in the chest but there was development of left adrenal nodule concerning for possibility of adrenal metastasis. There was also mild increase in the size of the right lower lobe and from a hilar nodule. The recent PET scan confirms evidence of disease progression with new bilateral adrenal metastases in addition to hypermetabolic right paratracheal and right common iliac nodal metastasis. I discussed the scan results and showed the images to the patient today. I recommended for her to discontinue her current maintenance treatment with Alimta. She was started on second line treatment with Tecentriq Acupuncturist) status post one cycle. She tolerated the first cycle of her treatment well with no significant adverse effects. I recommended for the patient to proceed with cycle #2 today as scheduled. For pain management, she will continue on Percocet to 10/325 mg by mouth every 6 hours as needed for pain.  For the metastatic bone disease, I would consider the patient for treatment with Xgeva once we receive dental clearance. She will come back for follow-up visit in 3 weeks for evaluation before starting cycle #3. She was advised to call immediately if she has any concerning symptoms in the  interval. The patient voices understanding of current disease status and treatment options and is in agreement with the current care plan.  All questions were answered. The patient knows to call the clinic with any problems, questions or concerns. We can certainly see the patient much sooner if necessary.  Disclaimer: This note was dictated with voice recognition software. Similar sounding words can inadvertently be transcribed and may not be corrected upon review.

## 2015-11-01 NOTE — Patient Instructions (Signed)
[facility] Discharge Instructions for Patients Receiving Chemotherapy  Today you received the following: Tecentriq  To help prevent nausea and vomiting after your treatment, we encourage you to take your nausea medication as directed.    If you develop nausea and vomiting that is not controlled by your nausea medication, call the clinic.   BELOW ARE SYMPTOMS THAT SHOULD BE REPORTED IMMEDIATELY:  *FEVER GREATER THAN 100.5 F  *CHILLS WITH OR WITHOUT FEVER  NAUSEA AND VOMITING THAT IS NOT CONTROLLED WITH YOUR NAUSEA MEDICATION  *UNUSUAL SHORTNESS OF BREATH  *UNUSUAL BRUISING OR BLEEDING  TENDERNESS IN MOUTH AND THROAT WITH OR WITHOUT PRESENCE OF ULCERS  *URINARY PROBLEMS  *BOWEL PROBLEMS  UNUSUAL RASH Items with * indicate a potential emergency and should be followed up as soon as possible.  Feel free to call the clinic you have any questions or concerns. The clinic phone number is [phone].  Please show the [place] at check-in to the Emergency Department and triage nurse.

## 2015-11-01 NOTE — Telephone Encounter (Signed)
per pof to sch pt appt-gave pt copy of avs °

## 2015-11-08 ENCOUNTER — Ambulatory Visit
Admission: RE | Admit: 2015-11-08 | Discharge: 2015-11-08 | Disposition: A | Discharge status: Home / Self Care | Payer: Medicaid Other | Source: Ambulatory Visit | Attending: Internal Medicine | Admitting: Internal Medicine

## 2015-11-08 DIAGNOSIS — R928 Other abnormal and inconclusive findings on diagnostic imaging of breast: Secondary | ICD-10-CM

## 2015-11-08 DIAGNOSIS — R921 Mammographic calcification found on diagnostic imaging of breast: Secondary | ICD-10-CM

## 2015-11-09 ENCOUNTER — Telehealth: Payer: Self-pay | Admitting: *Deleted

## 2015-11-09 DIAGNOSIS — C3491 Malignant neoplasm of unspecified part of right bronchus or lung: Secondary | ICD-10-CM

## 2015-11-09 DIAGNOSIS — C7951 Secondary malignant neoplasm of bone: Secondary | ICD-10-CM

## 2015-11-09 MED ORDER — OXYCODONE-ACETAMINOPHEN 10-325 MG PO TABS: 1.0000 | ORAL_TABLET | ORAL | Status: DC | PRN

## 2015-11-09 MED FILL — OXYCODONE-APAP 10-325 TAB: 10-325 | 10 days supply | Qty: 60 | Fill #0

## 2015-11-09 NOTE — Telephone Encounter (Signed)
Called patient to inform her prescription is ready for pick up.  Voice mail left on home number.  Reached via mobile.

## 2015-11-09 NOTE — Telephone Encounter (Signed)
"  I have one Percocet 10-325 mg pill left for today and need a refill.  Call my home or mobile number to let me know when to pick it up today.."  Last order was 10-12-2015 quantity: 60, Sig: take 1 q4 hrs prn pain.

## 2015-11-22 ENCOUNTER — Ambulatory Visit (HOSPITAL_BASED_OUTPATIENT_CLINIC_OR_DEPARTMENT_OTHER): Payer: Medicaid Other | Admitting: Internal Medicine

## 2015-11-22 ENCOUNTER — Other Ambulatory Visit (HOSPITAL_BASED_OUTPATIENT_CLINIC_OR_DEPARTMENT_OTHER): Payer: Medicaid Other

## 2015-11-22 ENCOUNTER — Ambulatory Visit (HOSPITAL_BASED_OUTPATIENT_CLINIC_OR_DEPARTMENT_OTHER): Payer: Medicaid Other

## 2015-11-22 ENCOUNTER — Encounter: Payer: Self-pay | Admitting: Internal Medicine

## 2015-11-22 ENCOUNTER — Telehealth: Payer: Self-pay | Admitting: Internal Medicine

## 2015-11-22 VITALS — BP 115/65 | HR 93 | Temp 97.9°F | Resp 18 | Ht 66.0 in | Wt 175.6 lb

## 2015-11-22 DIAGNOSIS — C7951 Secondary malignant neoplasm of bone: Secondary | ICD-10-CM

## 2015-11-22 DIAGNOSIS — Z79899 Other long term (current) drug therapy: Secondary | ICD-10-CM | POA: Diagnosis not present

## 2015-11-22 DIAGNOSIS — R5382 Chronic fatigue, unspecified: Secondary | ICD-10-CM

## 2015-11-22 DIAGNOSIS — C7972 Secondary malignant neoplasm of left adrenal gland: Secondary | ICD-10-CM | POA: Diagnosis not present

## 2015-11-22 DIAGNOSIS — Z5112 Encounter for antineoplastic immunotherapy: Secondary | ICD-10-CM

## 2015-11-22 DIAGNOSIS — C3491 Malignant neoplasm of unspecified part of right bronchus or lung: Secondary | ICD-10-CM

## 2015-11-22 DIAGNOSIS — C7971 Secondary malignant neoplasm of right adrenal gland: Secondary | ICD-10-CM

## 2015-11-22 LAB — CBC WITH DIFFERENTIAL/PLATELET
BASO%: 0.8 % (ref 0.0–2.0)
Basophils Absolute: 0 10*3/uL (ref 0.0–0.1)
EOS ABS: 0 10*3/uL (ref 0.0–0.5)
EOS%: 1.1 % (ref 0.0–7.0)
HEMATOCRIT: 37.7 % (ref 34.8–46.6)
HGB: 13 g/dL (ref 11.6–15.9)
LYMPH#: 1.5 10*3/uL (ref 0.9–3.3)
LYMPH%: 38.7 % (ref 14.0–49.7)
MCH: 33.1 pg (ref 25.1–34.0)
MCHC: 34.6 g/dL (ref 31.5–36.0)
MCV: 95.8 fL (ref 79.5–101.0)
MONO#: 0.4 10*3/uL (ref 0.1–0.9)
MONO%: 10.6 % (ref 0.0–14.0)
NEUT%: 48.8 % (ref 38.4–76.8)
NEUTROS ABS: 1.8 10*3/uL (ref 1.5–6.5)
PLATELETS: 398 10*3/uL (ref 145–400)
RBC: 3.94 10*6/uL (ref 3.70–5.45)
RDW: 14.1 % (ref 11.2–14.5)
WBC: 3.8 10*3/uL — ABNORMAL LOW (ref 3.9–10.3)

## 2015-11-22 LAB — COMPREHENSIVE METABOLIC PANEL
ALT: 17 U/L (ref 0–55)
AST: 19 U/L (ref 5–34)
Albumin: 3.5 g/dL (ref 3.5–5.0)
Alkaline Phosphatase: 120 U/L (ref 40–150)
Anion Gap: 10 mEq/L (ref 3–11)
BUN: 7.5 mg/dL (ref 7.0–26.0)
CALCIUM: 9.3 mg/dL (ref 8.4–10.4)
CHLORIDE: 110 meq/L — AB (ref 98–109)
CO2: 21 meq/L — AB (ref 22–29)
CREATININE: 1 mg/dL (ref 0.6–1.1)
EGFR: 78 mL/min/{1.73_m2} — ABNORMAL LOW (ref >90)
GLUCOSE: 99 mg/dL (ref 70–140)
POTASSIUM: 4 meq/L (ref 3.5–5.1)
SODIUM: 140 meq/L (ref 136–145)
Total Bilirubin: 0.53 mg/dL (ref 0.20–1.20)
Total Protein: 8.4 g/dL — ABNORMAL HIGH (ref 6.4–8.3)

## 2015-11-22 LAB — TSH: TSH: 1.452 m(IU)/L (ref 0.308–3.960)

## 2015-11-22 MED ORDER — HEPARIN SOD (PORK) LOCK FLUSH 100 UNIT/ML IV SOLN
500.0000 [IU] | Freq: Once | INTRAVENOUS | Status: AC | PRN
  Administered 2015-11-22: 500 [IU]
  Filled 2015-11-22: qty 5

## 2015-11-22 MED ORDER — SODIUM CHLORIDE 0.9 % IV SOLN
1200.0000 mg | Freq: Once | INTRAVENOUS | Status: AC
  Administered 2015-11-22: 1200 mg via INTRAVENOUS
  Filled 2015-11-22: qty 20

## 2015-11-22 MED ORDER — SODIUM CHLORIDE 0.9% FLUSH
10.0000 mL | INTRAVENOUS | Status: DC | PRN
  Administered 2015-11-22: 10 mL
  Filled 2015-11-22: qty 10

## 2015-11-22 MED ORDER — SODIUM CHLORIDE 0.9 % IV SOLN
Freq: Once | INTRAVENOUS | Status: AC
  Administered 2015-11-22: 10:00:00 via INTRAVENOUS

## 2015-11-22 NOTE — Progress Notes (Signed)
[facility] [phone]   [phone]  OFFICE PROGRESS NOTE  [name], MD [place] [address]  DIAGNOSIS: Stage IV (T1a, N2, M1b) non-small cell lung cancer, adenocarcinoma with negative EGFR mutation diagnosed in August 2016 and presented with right hilar and subcarinal lymphadenopathy as well as large soft tissue mass in the left posterior iliac bone.  PRIOR THERAPY:  1) Status post palliative radiotherapy to the soft tissue mass in the left iliac bone under the care of Dr. Valere Dross. 2) Systemic chemotherapy with carboplatin for AUC of 5 and Alimta 500 MG/M2 every 3 weeks. First dose expected on 02/22/2015. Status post 6 cycles. Starting from cycle #3 Alimta was reduced to 400 MG/M2 secondary to neutropenia after the first 2 cycles. 3) Maintenance systemic chemotherapy with single agent Alimta 500 MG/M2 every 3 weeks. First dose 07/19/2015. Status post 3 cycles. Last dose was given 08/30/2015 discontinued secondary to disease progression.  CURRENT THERAPY: Immunotherapy treatment with Tecentriq (Atezolizumab) 1200 MG IV every 3 weeks, for his dose 10/14/2015. Status post 2 cycles.  INTERVAL HISTORY: Andrea [name] 45 y.o. female returns to the clinic today for follow-up visit. The patient is feeling fine today with no specific complaints except for pain on the left buttock area. She rated her pain as 8 on a scale from 1-10. She is currently on Percocet 10/325 mg by mouth every 6 hours as needed for pain. It makes her sleepy at time. She tolerated the second cycle of immunotherapy with Tecentriq Huey Bienenstock) fairly well with no significant complaints. She denied having any significant skin rash or diarrhea. She denied having any significant chest pain, shortness of breath, cough or hemoptysis. She denied having any fever or chills. She has no nausea or vomiting. She denied having any significant weight loss or night sweats. She is here  today to start cycle #3.  MEDICAL HISTORY: Past Medical History:  Diagnosis Date  . 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 Anginal pain ([place])    chest pain not sure what related to  . Anxiety   . Chronic back pain   . Depression   . Dysmenorrhea 12/06/2009   Qualifier: Diagnosis of  By: [name] RN, Regino Schultze    . Encounter for antineoplastic immunotherapy 10/07/2015  . Family history of adverse reaction to anesthesia    mother stopped breathing after surgery  . Headache    stress  . Low back pain radiating to left leg 11/29/2010  . Non-small cell carcinoma of right lung, stage 4 ([place]) 01/01/2015  . S/P radiation therapy 01/01/2015 through 01/21/2015     Left iliac bone 3500 cGy in 14 sessions   . Shortness of breath dyspnea    with exertion, Pt denies 02/2015    ALLERGIES:  is allergic to morphine and related and hydrocodone.  MEDICATIONS:  Current Outpatient Prescriptions  Medication Sig Dispense Refill  . cyanocobalamin (,VITAMIN B-12,) 1000 MCG/ML injection Inject 1,000 mcg into the muscle once.    [name] dexamethasone (DECADRON) 4 MG tablet 4 mg po bid, the day before, day of and day after chemo 40 tablet 1  . feeding supplement, ENSURE ENLIVE, (ENSURE ENLIVE) LIQD Take 237 mLs by mouth 3 (three) times daily between meals. (Patient not taking: Reported on 11/01/2015) 329 mL 12  . folic acid (FOLVITE) 1 MG tablet Take 1 tablet (1 mg total) by mouth daily. 30 tablet 4  . guaiFENesin-dextromethorphan (ROBITUSSIN DM) 100-10 MG/5ML syrup Take 5  mLs by mouth every 4 (four) hours as needed for cough. Reported on 10/25/2015    . lidocaine-prilocaine (EMLA) cream Apply 1 teaspoon to skin 1 to 2 hours before use cover to keep in contact with skin 30 g PRN  . loratadine-pseudoephedrine (CLARITIN-D 24-HOUR) 10-240 MG 24 hr tablet Take 1 tablet by mouth daily.    . Multiple Vitamins-Minerals (MULTIVITAMIN & MINERAL PO) Take 2 tablets by  mouth daily. Reported on 11/01/2015    . oxyCODONE-acetaminophen (PERCOCET) 10-325 MG tablet Take 1 tablet by mouth every 4 (four) hours as needed for pain. 60 tablet 0  . Prenatal Vit-Fe Fumarate-FA (PRENATAL VITAMIN PO) Take 1 tablet by mouth daily. Reported on 11/01/2015    . prochlorperazine (COMPAZINE) 10 MG tablet Take 1 tablet (10 mg total) by mouth every 6 (six) hours as needed for nausea or vomiting. (Patient not taking: Reported on 11/01/2015) 60 tablet 0  . senna-docusate (SENOKOT-S) 8.6-50 MG per tablet Take 2 tablets by mouth at bedtime. 30 tablet 0   No current facility-administered medications for this visit.     SURGICAL HISTORY:  Past Surgical History:  Procedure Laterality Date  . laproscopy surgery to check her tubes    . LUMBAR LAMINECTOMY/DECOMPRESSION MICRODISCECTOMY Left 05/01/2014   Procedure: Microdiscectomy - L5-S1 - left;  Surgeon: Eustace Moore, MD;  Location: [facility];  Service: Neurosurgery;  Laterality: Left;    REVIEW OF SYSTEMS:  A comprehensive review of systems was negative except for: Musculoskeletal: positive for back pain and bone pain   PHYSICAL EXAMINATION: General appearance: alert, cooperative, fatigued and no distress Head: Normocephalic, without obvious abnormality, atraumatic Neck: no adenopathy, no JVD, supple, symmetrical, trachea midline and thyroid not enlarged, symmetric, no tenderness/mass/nodules Lymph nodes: Cervical, supraclavicular, and axillary nodes normal. Resp: clear to auscultation bilaterally Back: symmetric, no curvature. ROM normal. No CVA tenderness. Cardio: regular rate and rhythm, S1, S2 normal, no murmur, click, rub or gallop GI: soft, non-tender; bowel sounds normal; no masses,  no organomegaly Extremities: extremities normal, atraumatic, no cyanosis or edema Neurologic: Alert and oriented X 3, normal strength and tone. Normal symmetric reflexes. Normal coordination and gait  ECOG PERFORMANCE STATUS: 1 - Symptomatic but  completely ambulatory  Blood pressure 115/65, pulse 93, temperature 97.9 F (36.6 C), temperature source Oral, resp. rate 18, height _0  (1.676 m), weight 175 lb 9.6 oz (79.7 kg), SpO2 99 %.  LABORATORY DATA: Lab Results  Component Value Date   WBC 3.8 (L) 11/22/2015   HGB 13.0 11/22/2015   HCT 37.7 11/22/2015   MCV 95.8 11/22/2015   PLT 398 11/22/2015      Chemistry      Component Value Date/Time   NA 141 11/01/2015 0833   K 4.1 11/01/2015 0833   CL 111 03/08/2015 1100   CO2 22 11/01/2015 0833   BUN 8.7 11/01/2015 0833   CREATININE 1.0 11/01/2015 0833      Component Value Date/Time   CALCIUM 9.8 11/01/2015 0833   ALKPHOS 98 11/01/2015 0833   AST 18 11/01/2015 0833   ALT 11 11/01/2015 0833   BILITOT 0.36 11/01/2015 0833       RADIOGRAPHIC STUDIES: Mm Digital Diagnostic Unilat L  Result Date: 11/08/2015 CLINICAL DATA:  Left breast upper outer quadrant calcifications most recent screening mammography. EXAM: DIGITAL DIAGNOSTIC LEFT MAMMOGRAM WITH CAD COMPARISON:  Previous exam(s). ACR Breast Density Category c: The breast tissue is heterogeneously dense, which may obscure small masses. FINDINGS: Additional mammographic views of the left wrist demonstrate  3 mm group of probably benign calcifications in the left breast upper outer quadrant, posterior depth. Mammographic images were processed with CAD. IMPRESSION: Probably benign calcifications in the left breast, for which six-month follow-up is recommended. RECOMMENDATION: Diagnostic mammogram of the left breast in 6 months. (Code:FI-L-42M) I have discussed the findings and recommendations with the patient. Results were also provided in writing at the conclusion of the visit. If applicable, a reminder letter will be sent to the patient regarding the next appointment. BI-RADS CATEGORY  3: Probably benign finding(s) - short interval follow-up suggested. Electronically Signed   By: Fidela Salisbury M.D.   On: 11/08/2015 10:10    Mm Digital Screening Bilateral  Result Date: 10/29/2015 CLINICAL DATA:  Screening. EXAM: DIGITAL SCREENING BILATERAL MAMMOGRAM WITH CAD COMPARISON:  None. ACR Breast Density Category c: The breast tissue is heterogeneously dense, which may obscure small masses. FINDINGS: In the left breast, calcifications warrant further evaluation. In the right breast, no findings suspicious for malignancy. Images were processed with CAD. IMPRESSION: Further evaluation is suggested for calcifications in the left breast. RECOMMENDATION: Diagnostic mammogram of the left breast. (Code:FI-L-42M) The patient will be contacted regarding the findings, and additional imaging will be scheduled. BI-RADS CATEGORY  0: Incomplete. Need additional imaging evaluation and/or prior mammograms for comparison. Electronically Signed   By: Evangeline Dakin M.D.   On: 10/29/2015 12:39    ASSESSMENT AND PLAN: This is a very pleasant 45 years old African-American female recently diagnosed with metastatic non-small cell lung cancer, adenocarcinoma with negative EGFR mutation status post palliative radiotherapy to the left iliac bone and soft tissue mass. She is currently undergoing systemic chemotherapy with carboplatin and Alimta status post 6 cycles.  She is currently undergoing maintenance chemotherapy with single agent Alimta status post 3 cycles. She tolerated the first 3 cycles well except for mild nausea. The restaging CT scan of the chest, abdomen and pelvis showed stable disease in the chest but there was development of left adrenal nodule concerning for possibility of adrenal metastasis. There was also mild increase in the size of the right lower lobe and from a hilar nodule. The recent PET scan confirms evidence of disease progression with new bilateral adrenal metastases in addition to hypermetabolic right paratracheal and right common iliac nodal metastasis. She was started on second line treatment with Tecentriq Huey Bienenstock)  status post 2 cycles. She tolerated the first 2 cycles of her treatment well with no significant adverse effects. I recommended for the patient to proceed with cycle #3 today as scheduled. For pain management, she will continue on Percocet to 10/325 mg by mouth every 6 hours as needed for pain. I also advised her to take 1 or 2 doses of ibuprofen daily as needed For the metastatic bone disease, I would consider the patient for treatment with Xgeva once we receive dental clearance. She will come back for follow-up visit in 3 weeks for evaluation before starting cycle #4 after repeating CT scan of the chest, abdomen and pelvis for restaging of her disease. She was advised to call immediately if she has any concerning symptoms in the interval. The patient voices understanding of current disease status and treatment options and is in agreement with the current care plan.  All questions were answered. The patient knows to call the clinic with any problems, questions or concerns. We can certainly see the patient much sooner if necessary.  Disclaimer: This note was dictated with voice recognition software. Similar sounding words can inadvertently be transcribed and  may not be corrected upon review.

## 2015-11-22 NOTE — Patient Instructions (Signed)
[facility] Discharge Instructions for Patients Receiving Chemotherapy  Today you received the following chemotherapy agents: Tecentriq  To help prevent nausea and vomiting after your treatment, we encourage you to take your nausea medication as directed.    If you develop nausea and vomiting that is not controlled by your nausea medication, call the clinic.   BELOW ARE SYMPTOMS THAT SHOULD BE REPORTED IMMEDIATELY:  *FEVER GREATER THAN 100.5 F  *CHILLS WITH OR WITHOUT FEVER  NAUSEA AND VOMITING THAT IS NOT CONTROLLED WITH YOUR NAUSEA MEDICATION  *UNUSUAL SHORTNESS OF BREATH  *UNUSUAL BRUISING OR BLEEDING  TENDERNESS IN MOUTH AND THROAT WITH OR WITHOUT PRESENCE OF ULCERS  *URINARY PROBLEMS  *BOWEL PROBLEMS  UNUSUAL RASH Items with * indicate a potential emergency and should be followed up as soon as possible.  Feel free to call the clinic you have any questions or concerns. The clinic phone number is [phone].  Please show the CHEMO ALERT CARD at check-in to the Emergency Department and triage nurse.   

## 2015-11-22 NOTE — Telephone Encounter (Signed)
per pof to sch pt appt-gave pt copy of avs °

## 2015-11-29 ENCOUNTER — Ambulatory Visit (HOSPITAL_BASED_OUTPATIENT_CLINIC_OR_DEPARTMENT_OTHER): Payer: Medicaid Other

## 2015-11-29 VITALS — BP 112/74 | HR 95 | Temp 97.8°F | Resp 18

## 2015-11-29 DIAGNOSIS — C3491 Malignant neoplasm of unspecified part of right bronchus or lung: Secondary | ICD-10-CM

## 2015-11-29 DIAGNOSIS — M898X9 Other specified disorders of bone, unspecified site: Secondary | ICD-10-CM

## 2015-11-29 DIAGNOSIS — C7951 Secondary malignant neoplasm of bone: Secondary | ICD-10-CM

## 2015-11-29 MED ORDER — DENOSUMAB 120 MG/1.7ML ~~LOC~~ SOLN
120.0000 mg | Freq: Once | SUBCUTANEOUS | Status: AC
  Administered 2015-11-29: 120 mg via SUBCUTANEOUS
  Filled 2015-11-29: qty 1.7

## 2015-11-29 NOTE — Patient Instructions (Signed)
Denosumab injection  What is this medicine?  DENOSUMAB (den oh sue mab) slows bone breakdown. Prolia is used to treat osteoporosis in women after menopause and in men. Xgeva is used to prevent bone fractures and other bone problems caused by cancer bone metastases. Xgeva is also used to treat giant cell tumor of the bone.  This medicine may be used for other purposes; ask your health care provider or pharmacist if you have questions.  What should I tell my health care provider before I take this medicine?  They need to know if you have any of these conditions:  -dental disease  -eczema  -infection or history of infections  -kidney disease or on dialysis  -low blood calcium or vitamin D  -malabsorption syndrome  -scheduled to have surgery or tooth extraction  -taking medicine that contains denosumab  -thyroid or parathyroid disease  -an unusual reaction to denosumab, other medicines, foods, dyes, or preservatives  -pregnant or trying to get pregnant  -breast-feeding  How should I use this medicine?  This medicine is for injection under the skin. It is given by a health care professional in a hospital or clinic setting.  If you are getting Prolia, a special MedGuide will be given to you by the pharmacist with each prescription and refill. Be sure to read this information carefully each time.  For Prolia, talk to your pediatrician regarding the use of this medicine in children. Special care may be needed. For Xgeva, talk to your pediatrician regarding the use of this medicine in children. While this drug may be prescribed for children as young as 13 years for selected conditions, precautions do apply.  Overdosage: If you think you have taken too much of this medicine contact a poison control center or emergency room at once.  NOTE: This medicine is only for you. Do not share this medicine with others.  What if I miss a dose?  It is important not to miss your dose. Call your doctor or health care professional if you are  unable to keep an appointment.  What may interact with this medicine?  Do not take this medicine with any of the following medications:  -other medicines containing denosumab  This medicine may also interact with the following medications:  -medicines that suppress the immune system  -medicines that treat cancer  -steroid medicines like prednisone or cortisone  This list may not describe all possible interactions. Give your health care provider a list of all the medicines, herbs, non-prescription drugs, or dietary supplements you use. Also tell them if you smoke, drink alcohol, or use illegal drugs. Some items may interact with your medicine.  What should I watch for while using this medicine?  Visit your doctor or health care professional for regular checks on your progress. Your doctor or health care professional may order blood tests and other tests to see how you are doing.  Call your doctor or health care professional if you get a cold or other infection while receiving this medicine. Do not treat yourself. This medicine may decrease your body's ability to fight infection.  You should make sure you get enough calcium and vitamin D while you are taking this medicine, unless your doctor tells you not to. Discuss the foods you eat and the vitamins you take with your health care professional.  See your dentist regularly. Brush and floss your teeth as directed. Before you have any dental work done, tell your dentist you are receiving this medicine.  Do   not become pregnant while taking this medicine or for 5 months after stopping it. Women should inform their doctor if they wish to become pregnant or think they might be pregnant. There is a potential for serious side effects to an unborn child. Talk to your health care professional or pharmacist for more information.  What side effects may I notice from receiving this medicine?  Side effects that you should report to your doctor or health care professional as soon as  possible:  -allergic reactions like skin rash, itching or hives, swelling of the face, lips, or tongue  -breathing problems  -chest pain  -fast, irregular heartbeat  -feeling faint or lightheaded, falls  -fever, chills, or any other sign of infection  -muscle spasms, tightening, or twitches  -numbness or tingling  -skin blisters or bumps, or is dry, peels, or red  -slow healing or unexplained pain in the mouth or jaw  -unusual bleeding or bruising  Side effects that usually do not require medical attention (Report these to your doctor or health care professional if they continue or are bothersome.):  -muscle pain  -stomach upset, gas  This list may not describe all possible side effects. Call your doctor for medical advice about side effects. You may report side effects to FDA at 1-800-FDA-1088.  Where should I keep my medicine?  This medicine is only given in a clinic, doctor's office, or other health care setting and will not be stored at home.  NOTE: This sheet is a summary. It may not cover all possible information. If you have questions about this medicine, talk to your doctor, pharmacist, or health care provider.      2016, [employer]vier/[employer] Standard. (2011-10-09 12:37:47)

## 2015-12-09 ENCOUNTER — Telehealth: Payer: Self-pay | Admitting: Internal Medicine

## 2015-12-09 NOTE — Telephone Encounter (Signed)
APT. REMINDER CALL, LNTCB

## 2015-12-10 ENCOUNTER — Telehealth: Payer: Self-pay | Admitting: Medical Oncology

## 2015-12-10 ENCOUNTER — Ambulatory Visit (HOSPITAL_BASED_OUTPATIENT_CLINIC_OR_DEPARTMENT_OTHER): Payer: Medicaid Other | Admitting: Nurse Practitioner

## 2015-12-10 ENCOUNTER — Ambulatory Visit (HOSPITAL_BASED_OUTPATIENT_CLINIC_OR_DEPARTMENT_OTHER): Payer: Medicaid Other

## 2015-12-10 ENCOUNTER — Other Ambulatory Visit: Payer: Self-pay | Admitting: Medical Oncology

## 2015-12-10 ENCOUNTER — Ambulatory Visit (INDEPENDENT_AMBULATORY_CARE_PROVIDER_SITE_OTHER): Payer: Medicaid Other | Admitting: *Deleted

## 2015-12-10 ENCOUNTER — Ambulatory Visit (HOSPITAL_COMMUNITY)
Admission: RE | Admit: 2015-12-10 | Discharge: 2015-12-10 | Disposition: A | Discharge status: Home / Self Care | Payer: Medicaid Other | Source: Ambulatory Visit | Attending: Nurse Practitioner | Admitting: Nurse Practitioner

## 2015-12-10 ENCOUNTER — Encounter (HOSPITAL_COMMUNITY): Payer: Self-pay

## 2015-12-10 VITALS — BP 101/68 | HR 101 | Temp 98.7°F | Resp 16

## 2015-12-10 DIAGNOSIS — R590 Localized enlarged lymph nodes: Secondary | ICD-10-CM | POA: Diagnosis not present

## 2015-12-10 DIAGNOSIS — C7951 Secondary malignant neoplasm of bone: Secondary | ICD-10-CM | POA: Diagnosis not present

## 2015-12-10 DIAGNOSIS — Z95828 Presence of other vascular implants and grafts: Secondary | ICD-10-CM | POA: Diagnosis not present

## 2015-12-10 DIAGNOSIS — R6883 Chills (without fever): Secondary | ICD-10-CM

## 2015-12-10 DIAGNOSIS — G893 Neoplasm related pain (acute) (chronic): Secondary | ICD-10-CM

## 2015-12-10 DIAGNOSIS — M898X9 Other specified disorders of bone, unspecified site: Secondary | ICD-10-CM

## 2015-12-10 DIAGNOSIS — Z3042 Encounter for surveillance of injectable contraceptive: Secondary | ICD-10-CM | POA: Diagnosis not present

## 2015-12-10 DIAGNOSIS — C3491 Malignant neoplasm of unspecified part of right bronchus or lung: Secondary | ICD-10-CM

## 2015-12-10 DIAGNOSIS — R0602 Shortness of breath: Secondary | ICD-10-CM

## 2015-12-10 DIAGNOSIS — M899 Disorder of bone, unspecified: Secondary | ICD-10-CM | POA: Insufficient documentation

## 2015-12-10 DIAGNOSIS — E278 Other specified disorders of adrenal gland: Secondary | ICD-10-CM | POA: Insufficient documentation

## 2015-12-10 DIAGNOSIS — C7970 Secondary malignant neoplasm of unspecified adrenal gland: Secondary | ICD-10-CM | POA: Diagnosis not present

## 2015-12-10 LAB — CBC WITH DIFFERENTIAL/PLATELET
BASO%: 0.4 % (ref 0.0–2.0)
Basophils Absolute: 0 10*3/uL (ref 0.0–0.1)
EOS%: 0.6 % (ref 0.0–7.0)
Eosinophils Absolute: 0 10*3/uL (ref 0.0–0.5)
HEMATOCRIT: 36 % (ref 34.8–46.6)
HEMOGLOBIN: 12.3 g/dL (ref 11.6–15.9)
LYMPH#: 1.7 10*3/uL (ref 0.9–3.3)
LYMPH%: 35.2 % (ref 14.0–49.7)
MCH: 31.6 pg (ref 25.1–34.0)
MCHC: 34.2 g/dL (ref 31.5–36.0)
MCV: 92.5 fL (ref 79.5–101.0)
MONO#: 0.3 10*3/uL (ref 0.1–0.9)
MONO%: 6.5 % (ref 0.0–14.0)
NEUT%: 57.3 % (ref 38.4–76.8)
NEUTROS ABS: 2.7 10*3/uL (ref 1.5–6.5)
NRBC: 0 % (ref 0–0)
Platelets: 450 10*3/uL — ABNORMAL HIGH (ref 145–400)
RBC: 3.89 10*6/uL (ref 3.70–5.45)
RDW: 13.8 % (ref 11.2–14.5)
WBC: 4.8 10*3/uL (ref 3.9–10.3)

## 2015-12-10 LAB — COMPREHENSIVE METABOLIC PANEL
ALBUMIN: 3.4 g/dL — AB (ref 3.5–5.0)
ALK PHOS: 123 U/L (ref 40–150)
ALT: 9 U/L (ref 0–55)
AST: 16 U/L (ref 5–34)
Anion Gap: 8 mEq/L (ref 3–11)
BILIRUBIN TOTAL: 0.56 mg/dL (ref 0.20–1.20)
BUN: 6.2 mg/dL — AB (ref 7.0–26.0)
CALCIUM: 9 mg/dL (ref 8.4–10.4)
CO2: 22 mEq/L (ref 22–29)
CREATININE: 0.9 mg/dL (ref 0.6–1.1)
Chloride: 109 mEq/L (ref 98–109)
EGFR: 88 mL/min/{1.73_m2} — ABNORMAL LOW (ref >90)
GLUCOSE: 101 mg/dL (ref 70–140)
POTASSIUM: 3.7 meq/L (ref 3.5–5.1)
SODIUM: 139 meq/L (ref 136–145)
TOTAL PROTEIN: 8.7 g/dL — AB (ref 6.4–8.3)

## 2015-12-10 MED ORDER — OXYCODONE HCL 5 MG PO TABS: ORAL_TABLET | ORAL | 0 refills | Status: DC

## 2015-12-10 MED ORDER — IOPAMIDOL (ISOVUE-370) INJECTION 76%
100.0000 mL | Freq: Once | INTRAVENOUS | Status: AC | PRN
  Administered 2015-12-10: 65 mL via INTRAVENOUS

## 2015-12-10 MED ORDER — SODIUM CHLORIDE 0.9 % IJ SOLN
10.0000 mL | INTRAMUSCULAR | Status: DC | PRN
  Administered 2015-12-10: 10 mL via INTRAVENOUS
  Filled 2015-12-10: qty 10

## 2015-12-10 MED ORDER — OXYCODONE-ACETAMINOPHEN 10-325 MG PO TABS: 1.0000 | ORAL_TABLET | ORAL | 0 refills | Status: DC | PRN

## 2015-12-10 MED ORDER — OXYCODONE-ACETAMINOPHEN 5-325 MG PO TABS
2.0000 | ORAL_TABLET | Freq: Once | ORAL | Status: AC
  Administered 2015-12-10: 2 via ORAL

## 2015-12-10 MED ORDER — SODIUM CHLORIDE 0.9% FLUSH
10.0000 mL | Freq: Once | INTRAVENOUS | Status: AC
  Administered 2015-12-10: 10 mL via INTRAVENOUS
  Filled 2015-12-10: qty 10

## 2015-12-10 MED ORDER — MEDROXYPROGESTERONE ACETATE 150 MG/ML IM SUSP
150.0000 mg | Freq: Once | INTRAMUSCULAR | Status: AC
  Administered 2015-12-10: 150 mg via INTRAMUSCULAR

## 2015-12-10 MED ORDER — HEPARIN SOD (PORK) LOCK FLUSH 100 UNIT/ML IV SOLN
500.0000 [IU] | Freq: Once | INTRAVENOUS | Status: AC
  Administered 2015-12-10: 500 [IU] via INTRAVENOUS
  Filled 2015-12-10: qty 5

## 2015-12-10 MED ORDER — OXYCODONE-ACETAMINOPHEN 5-325 MG PO TABS
ORAL_TABLET | ORAL | Status: AC
  Filled 2015-12-10: qty 2

## 2015-12-10 MED FILL — OXYCODONE-APAP 10-325 TAB: 10-325 | 8 days supply | Qty: 45 | Fill #0

## 2015-12-10 MED FILL — LIDOCAINE-PRILOCAINE CREAM: 2.5-2.5 | 30 days supply | Qty: 30 | Fill #4

## 2015-12-10 NOTE — Telephone Encounter (Signed)
Trouble breathing since Tuesday seems like it is my left side with pain in my back. I cannot take a deep breath without it hurting in back and front - back is worse. She denies fever or cough. Last chemo three weeks ago. I told her to come in for labs and [facility] today . Message sent to schedule.

## 2015-12-10 NOTE — Progress Notes (Signed)
Discharged home with prescription for pain meds.   SYMPTOM MANAGEMENT CLINIC    Chief Complaint: Shortness of breath, pain with inspiration  HPI:  Andrea [name] 45 y.o. 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  Currently undergoing Tecentriq therapy; as well as Xgeva therapy.    No history exists.    Review of Systems  Constitutional: Positive for malaise/fatigue.  Respiratory: Positive for shortness of breath.        Pain with inspiration  All other systems reviewed and are negative.   Past Medical History:  Diagnosis Date  . Adrenal mass, left ([place]) 09/21/2015  . Anginal pain ([place])    chest pain not sure what related to  . Anxiety   . Chronic back pain   . Depression   . Dysmenorrhea 12/06/2009   Qualifier: Diagnosis of  By: [name] RN, Regino Schultze    . Encounter for antineoplastic immunotherapy 10/07/2015  . Family history of adverse reaction to anesthesia    mother stopped breathing after surgery  . Headache    stress  . Low back pain radiating to left leg 11/29/2010  . Non-small cell carcinoma of right lung, stage 4 ([place]) 01/01/2015  . S/P radiation therapy 01/01/2015 through 01/21/2015     Left iliac bone 3500 cGy in 14 sessions   . Shortness of breath dyspnea    with exertion, Pt denies 02/2015    Past Surgical History:  Procedure Laterality Date  . laproscopy surgery to check her tubes    . LUMBAR LAMINECTOMY/DECOMPRESSION MICRODISCECTOMY Left 05/01/2014   Procedure: Microdiscectomy - L5-S1 - left;  Surgeon: Eustace Moore, MD;  Location: [facility];  Service: Neurosurgery;  Laterality: Left;    has Dysmenorrhea; Degenerative disc disease, lumbar; Tobacco abuse; Depo-Provera contraceptive status; Depression; Annular disc tear; S/P lumbar laminectomy; Lumbar disc prolapse with compression radiculopathy; Abnormal MRI; Mass of left hip region; Bone metastases ([place]); Non-small  cell carcinoma of right lung, stage 4 ([place]); Malnutrition ([place]); Cancer associated pain; Encounter for antineoplastic chemotherapy; Port catheter in place; Adrenal mass, left ([place]); Encounter for antineoplastic immunotherapy; Chronic fatigue; Encounter for preventive health examination; and Unintentional weight loss on her problem list.    is allergic to morphine and related and hydrocodone.    Medication List       Accurate as of 12/10/15 11:59 PM. Always use your most recent med list.          cyanocobalamin 1000 MCG/ML injection Commonly known as:  (VITAMIN B-12) Inject 1,000 mcg into the muscle once.   dexamethasone 4 MG tablet Commonly known as:  DECADRON 4 mg po bid, the day before, day of and day after chemo   feeding supplement (ENSURE ENLIVE) Liqd Take 237 mLs by mouth 3 (three) times daily between meals.   folic acid 1 MG tablet Commonly known as:  FOLVITE Take 1 tablet (1 mg total) by mouth daily.   guaiFENesin-dextromethorphan 100-10 MG/5ML syrup Commonly known as:  ROBITUSSIN DM Take 5 mLs by mouth every 4 (four) hours as needed for cough. Reported on 10/25/2015   lidocaine-prilocaine cream Commonly known as:  EMLA Apply 1 teaspoon to skin 1 to 2 hours before use cover to keep in contact with skin   loratadine-pseudoephedrine 10-240 MG 24 hr tablet Commonly known as:  CLARITIN-D 24-hour Take 1 tablet by mouth daily.   MULTIVITAMIN & MINERAL PO Take 2 tablets by mouth daily. Reported on 11/01/2015   oxyCODONE-acetaminophen 10-325 MG tablet Commonly known as:  PERCOCET Take 1 tablet by mouth every 4 (four) hours as needed for pain.   PRENATAL VITAMIN PO Take 1 tablet by mouth daily. Reported on 11/01/2015   prochlorperazine 10 MG tablet Commonly known as:  COMPAZINE Take 1 tablet (10 mg total) by mouth every 6 (six) hours as needed for nausea or vomiting.   senna-docusate 8.6-50 MG tablet Commonly known as:  Senokot-S Take 2 tablets by mouth at bedtime.         PHYSICAL EXAMINATION  Oncology Vitals 12/10/2015 11/29/2015  Height - -  Weight - -  Weight (lbs) - -  BMI (kg/m2) - -  Temp 98.7 97.8  Pulse 101 95  Resp 16 18  SpO2 100 100  BSA (m2) - -   BP Readings from Last 2 Encounters:  12/10/15 101/68  11/29/15 112/74    Physical Exam  Constitutional: She is oriented to person, place, and time and well-developed, well-nourished, and in no distress.  HENT:  Head: Normocephalic and atraumatic.  Mouth/Throat: Oropharynx is clear and moist.  Eyes: Conjunctivae and EOM are normal. Pupils are equal, round, and reactive to light.  Neck: Normal range of motion. Neck supple. No JVD present. No tracheal deviation present. No thyromegaly present.  Cardiovascular: Normal rate, regular rhythm, normal heart sounds and intact distal pulses.   Pulmonary/Chest: Breath sounds normal. No stridor. No respiratory distress. She has no wheezes. She exhibits no tenderness.  On initial exam.  Patient did appear to have some difficulty taking a deep breath.  No acute respiratory distress noted.  Abdominal: Soft. Bowel sounds are normal. She exhibits no distension and no mass. There is no tenderness. There is no rebound and no guarding.  Musculoskeletal: Normal range of motion. She exhibits no edema or tenderness.  Lymphadenopathy:    She has no cervical adenopathy.  Neurological: She is alert and oriented to person, place, and time. Gait normal.  Skin: Skin is warm and dry.  Psychiatric: Affect normal.  Nursing note and vitals reviewed.   LABORATORY DATA:. Appointment on 12/10/2015  Component Date Value Ref Range Status  . WBC 12/10/2015 4.8  3.9 - 10.3 10e3/uL Final  . NEUT# 12/10/2015 2.7  1.5 - 6.5 10e3/uL Final  . HGB 12/10/2015 12.3  11.6 - 15.9 g/dL Final  . HCT 12/10/2015 36.0  34.8 - 46.6 % Final  . Platelets 12/10/2015 450* 145 - 400 10e3/uL Final  . MCV 12/10/2015 92.5  79.5 - 101.0 fL Final  . MCH 12/10/2015 31.6  25.1 - 34.0 pg Final   . MCHC 12/10/2015 34.2  31.5 - 36.0 g/dL Final  . RBC 12/10/2015 3.89  3.70 - 5.45 10e6/uL Final  . RDW 12/10/2015 13.8  11.2 - 14.5 % Final  . lymph# 12/10/2015 1.7  0.9 - 3.3 10e3/uL Final  . MONO# 12/10/2015 0.3  0.1 - 0.9 10e3/uL Final  . Eosinophils Absolute 12/10/2015 0.0  0.0 - 0.5 10e3/uL Final  . Basophils Absolute 12/10/2015 0.0  0.0 - 0.1 10e3/uL Final  . NEUT% 12/10/2015 57.3  38.4 - 76.8 % Final  . LYMPH% 12/10/2015 35.2  14.0 - 49.7 % Final  . MONO% 12/10/2015 6.5  0.0 - 14.0 % Final  . EOS% 12/10/2015 0.6  0.0 - 7.0 % Final  . BASO% 12/10/2015 0.4  0.0 - 2.0 % Final  . nRBC 12/10/2015 0  0 - 0 % Final  . Sodium 12/10/2015 139  136 - 145 mEq/L Final  . Potassium 12/10/2015 3.7  3.5 - 5.1  mEq/L Final  . Chloride 12/10/2015 109  98 - 109 mEq/L Final  . CO2 12/10/2015 22  22 - 29 mEq/L Final  . Glucose 12/10/2015 101  70 - 140 mg/dl Final  . BUN 12/10/2015 6.2* 7.0 - 26.0 mg/dL Final  . Creatinine 12/10/2015 0.9  0.6 - 1.1 mg/dL Final  . Total Bilirubin 12/10/2015 0.56  0.20 - 1.20 mg/dL Final  . Alkaline Phosphatase 12/10/2015 123  40 - 150 U/L Final  . AST 12/10/2015 16  5 - 34 U/L Final  . ALT 12/10/2015 9  0 - 55 U/L Final  . Total Protein 12/10/2015 8.7* 6.4 - 8.3 g/dL Final  . Albumin 12/10/2015 3.4* 3.5 - 5.0 g/dL Final  . Calcium 12/10/2015 9.0  8.4 - 10.4 mg/dL Final  . Anion Gap 12/10/2015 8  3 - 11 mEq/L Final  . EGFR 12/10/2015 88* >90 ml/min/1.73 m2 Final    RADIOGRAPHIC STUDIES: Ct Angio Chest Pe W Or Wo Contrast  Result Date: 12/10/2015 CLINICAL DATA:  Shortness of breath and chest pain.  Lung carcinoma. EXAM: CT ANGIOGRAPHY CHEST WITH CONTRAST TECHNIQUE: Multidetector CT imaging of the chest was performed using the standard protocol during bolus administration of intravenous contrast. Multiplanar CT image reconstructions and MIPs were obtained to evaluate the vascular anatomy. CONTRAST:  65 mL Isovue 370 nonionic COMPARISON:  PET-CT September 30, 2015 FINDINGS:  Cardiovascular: There is no demonstrable pulmonary embolus. There is no thoracic aortic aneurysm or dissection. Visualized great vessels appear normal. Port-A-Cath tip is in the superior vena cava near the cavoatrial junction. Pericardium is not thickened. Mediastinum/Nodes: Visualized thyroid appears normal. A previously noted lymph node in the right paratracheal region which showed increased metabolic activity on the recent PET study measures 1.4 by 1.0 cm, slightly larger than on the recent prior study. No other lymph node prominence is evident. Lungs/Pleura: The mass in the medial aspect of the superior segment right lower lobe is again noted, measuring 1.8 x 1.2 cm. This mass abuts the proximal right lower lobe pulmonary artery. There is patchy atelectatic change in both lung bases. No new parenchymal lung masses evident. A few small bullae are noted in the upper lobes near the apices. Upper Abdomen: There are bilateral adrenal masses, stable. Visualized upper abdominal structures otherwise appear unremarkable. Musculoskeletal: No blastic or lytic bone lesions are evident. Review of the MIP images confirms the above findings. IMPRESSION: No demonstrable pulmonary embolus. Slight increase in size of right paratracheal lymph node which on most recent PET study showed abnormal metabolic activity. The mass in the right lower lobe is again. It may be marginally smaller compared to recent prior study. There is patchy bibasilar atelectasis. Bilateral adrenal masses noted. Electronically Signed   By: Lowella Grip III M.D.   On: 12/10/2015 15:24    ASSESSMENT/PLAN:    Non-small cell carcinoma of right lung, stage 4 ([facility]) Patient received cycle 3 of her Tecentriq  therapy on 11/22/2015.  She received her last Xgeva on 11/29/2015.  Labs were all essentially within normal limits today.  Patient is scheduled to return on 12/13/2015 for labs, flush, visit, and her next cycle of treatment.   Cancer associated  pain Patient presented to the [facility] today with complaint of shortness of breath and pain with inspiration.  She denied any active chest pain or chest pressure.  She also denied any other new symptoms whatsoever.  She denies any recent fevers or chills.  Exam today revealed.  Lungs essentially clear bilaterally with  no cough or wheeze.  Patient does not appear short of breath on exam; but did appear unable to take a deep breath without discomfort.  Patient initially stated that she had ran out of her Percocet 10/325 tablets 1-2 days early; since she had doubled up on some of her pain medication occasionally.  Patient underwent a CT angiogram of the chest with contrast; to rule out pulmonary embolism.  CT angiogram of the chest revealed: IMPRESSION: No demonstrable pulmonary embolus.  Slight increase in size of right paratracheal lymph node which on most recent PET study showed abnormal metabolic activity. The mass in the right lower lobe is again. It may be marginally smaller compared to recent prior study. There is patchy bibasilar Atelectasis.  The patient returned from the CT angiogram of the chest to review results. Patient stated that she had actually been out of her pain medication since this past Sunday, 12/05/2015.  Long discussion with both the patient and her mother regarding the need to avoid running out of her pain medication in the future; because it can cause an acute pain crisis.  Patient was given a small amount of her Percocet 10/325 pain medication to last to the beginning of next week when she sees Dr. Julien Nordmann.  Advised patient she may want to talk with Dr. Julien Nordmann regarding the possibility of long acting pain medication for better pain control in the future.  Patient was advised to call/return or go directly to the emergency department for any worsening symptoms whatsoever.    Patient stated understanding of all instructions; and was in agreement with this plan of  care. The patient knows to call the clinic with any problems, questions or concerns.   Total time spent with patient was 40 minutes;  with greater than 75 percent of that time spent in face to face counseling regarding patient's symptoms,  and coordination of care and follow up.  Disclaimer:This dictation was prepared with Dragon/digital dictation along with [employer]. Any transcriptional errors that result from this process are unintentional.  Drue Second, NP 12/12/2015

## 2015-12-12 ENCOUNTER — Encounter: Payer: Self-pay | Admitting: Nurse Practitioner

## 2015-12-12 NOTE — Assessment & Plan Note (Signed)
Patient presented to the [facility] today with complaint of shortness of breath and pain with inspiration.  She denied any active chest pain or chest pressure.  She also denied any other new symptoms whatsoever.  She denies any recent fevers or chills.  Exam today revealed.  Lungs essentially clear bilaterally with no cough or wheeze.  Patient does not appear short of breath on exam; but did appear unable to take a deep breath without discomfort.  Patient initially stated that she had ran out of her Percocet 10/325 tablets 1-2 days early; since she had doubled up on some of her pain medication occasionally.  Patient underwent a CT angiogram of the chest with contrast; to rule out pulmonary embolism.  CT angiogram of the chest revealed: IMPRESSION: No demonstrable pulmonary embolus.  Slight increase in size of right paratracheal lymph node which on most recent PET study showed abnormal metabolic activity. The mass in the right lower lobe is again. It may be marginally smaller compared to recent prior study. There is patchy bibasilar Atelectasis.  The patient returned from the CT angiogram of the chest to review results. Patient stated that she had actually been out of her pain medication since this past Sunday, 12/05/2015.  Long discussion with both the patient and her mother regarding the need to avoid running out of her pain medication in the future; because it can cause an acute pain crisis.  Patient was given a small amount of her Percocet 10/325 pain medication to last to the beginning of next week when she sees Dr. Julien Nordmann.  Advised patient she may want to talk with Dr. Julien Nordmann regarding the possibility of long acting pain medication for better pain control in the future.  Patient was advised to call/return or go directly to the emergency department for any worsening symptoms whatsoever.

## 2015-12-12 NOTE — Assessment & Plan Note (Signed)
Patient received cycle 3 of her Tecentriq  therapy on 11/22/2015.  She received her last Xgeva on 11/29/2015.  Labs were all essentially within normal limits today.  Patient is scheduled to return on 12/13/2015 for labs, flush, visit, and her next cycle of treatment.

## 2015-12-13 ENCOUNTER — Encounter: Payer: Self-pay | Admitting: Internal Medicine

## 2015-12-13 ENCOUNTER — Ambulatory Visit (HOSPITAL_BASED_OUTPATIENT_CLINIC_OR_DEPARTMENT_OTHER): Payer: Medicaid Other

## 2015-12-13 ENCOUNTER — Ambulatory Visit: Payer: Medicaid Other

## 2015-12-13 ENCOUNTER — Other Ambulatory Visit (HOSPITAL_BASED_OUTPATIENT_CLINIC_OR_DEPARTMENT_OTHER): Payer: Medicaid Other

## 2015-12-13 ENCOUNTER — Other Ambulatory Visit: Payer: Self-pay

## 2015-12-13 ENCOUNTER — Other Ambulatory Visit: Payer: Self-pay | Admitting: Medical Oncology

## 2015-12-13 ENCOUNTER — Ambulatory Visit (HOSPITAL_BASED_OUTPATIENT_CLINIC_OR_DEPARTMENT_OTHER): Payer: Medicaid Other | Admitting: Internal Medicine

## 2015-12-13 ENCOUNTER — Telehealth: Payer: Self-pay | Admitting: Internal Medicine

## 2015-12-13 VITALS — BP 106/70 | HR 98 | Temp 97.7°F | Resp 18 | Ht 66.0 in | Wt 173.9 lb

## 2015-12-13 DIAGNOSIS — Z5112 Encounter for antineoplastic immunotherapy: Secondary | ICD-10-CM

## 2015-12-13 DIAGNOSIS — C3491 Malignant neoplasm of unspecified part of right bronchus or lung: Secondary | ICD-10-CM

## 2015-12-13 DIAGNOSIS — C7971 Secondary malignant neoplasm of right adrenal gland: Secondary | ICD-10-CM | POA: Diagnosis not present

## 2015-12-13 DIAGNOSIS — Z79899 Other long term (current) drug therapy: Secondary | ICD-10-CM

## 2015-12-13 DIAGNOSIS — C7951 Secondary malignant neoplasm of bone: Secondary | ICD-10-CM

## 2015-12-13 DIAGNOSIS — C7972 Secondary malignant neoplasm of left adrenal gland: Secondary | ICD-10-CM | POA: Diagnosis not present

## 2015-12-13 DIAGNOSIS — R5382 Chronic fatigue, unspecified: Secondary | ICD-10-CM

## 2015-12-13 DIAGNOSIS — Z95828 Presence of other vascular implants and grafts: Secondary | ICD-10-CM

## 2015-12-13 DIAGNOSIS — M898X9 Other specified disorders of bone, unspecified site: Secondary | ICD-10-CM

## 2015-12-13 LAB — TSH: TSH: 1.727 m[IU]/L (ref 0.308–3.960)

## 2015-12-13 MED ORDER — SODIUM CHLORIDE 0.9 % IV SOLN
1200.0000 mg | Freq: Once | INTRAVENOUS | Status: AC
  Administered 2015-12-13: 1200 mg via INTRAVENOUS
  Filled 2015-12-13: qty 20

## 2015-12-13 MED ORDER — HEPARIN SOD (PORK) LOCK FLUSH 100 UNIT/ML IV SOLN
500.0000 [IU] | Freq: Once | INTRAVENOUS | Status: AC | PRN
  Administered 2015-12-13: 500 [IU]
  Filled 2015-12-13: qty 5

## 2015-12-13 MED ORDER — SODIUM CHLORIDE 0.9 % IV SOLN
Freq: Once | INTRAVENOUS | Status: AC
  Administered 2015-12-13: 10:00:00 via INTRAVENOUS

## 2015-12-13 MED ORDER — SODIUM CHLORIDE 0.9 % IJ SOLN
10.0000 mL | INTRAMUSCULAR | Status: DC | PRN
  Administered 2015-12-13: 10 mL via INTRAVENOUS
  Filled 2015-12-13: qty 10

## 2015-12-13 MED ORDER — FENTANYL 25 MCG/HR TD PT72: 25.0000 ug | MEDICATED_PATCH | TRANSDERMAL | 0 refills | Status: DC

## 2015-12-13 MED ORDER — SODIUM CHLORIDE 0.9% FLUSH
10.0000 mL | INTRAVENOUS | Status: DC | PRN
  Administered 2015-12-13: 10 mL
  Filled 2015-12-13: qty 10

## 2015-12-13 MED FILL — fentaNYL 25 MCG/HR PT72: 25 | 30 days supply | Qty: 10 | Fill #0

## 2015-12-13 NOTE — Patient Instructions (Signed)

## 2015-12-13 NOTE — Telephone Encounter (Signed)
Gave patient avs report and appointments for September thru October. Patient already on scheduled for q3w lab/fu/tx thru October. Central radiology scheduling will contact patient re scans. Patient aware.

## 2015-12-13 NOTE — Patient Instructions (Signed)
[facility] Discharge Instructions for Patients Receiving Chemotherapy  Today you received the following chemotherapy agents: Tecentriq  To help prevent nausea and vomiting after your treatment, we encourage you to take your nausea medication as directed.    If you develop nausea and vomiting that is not controlled by your nausea medication, call the clinic.   BELOW ARE SYMPTOMS THAT SHOULD BE REPORTED IMMEDIATELY:  *FEVER GREATER THAN 100.5 F  *CHILLS WITH OR WITHOUT FEVER  NAUSEA AND VOMITING THAT IS NOT CONTROLLED WITH YOUR NAUSEA MEDICATION  *UNUSUAL SHORTNESS OF BREATH  *UNUSUAL BRUISING OR BLEEDING  TENDERNESS IN MOUTH AND THROAT WITH OR WITHOUT PRESENCE OF ULCERS  *URINARY PROBLEMS  *BOWEL PROBLEMS  UNUSUAL RASH Items with * indicate a potential emergency and should be followed up as soon as possible.  Feel free to call the clinic you have any questions or concerns. The clinic phone number is [phone].  Please show the CHEMO ALERT CARD at check-in to the Emergency Department and triage nurse.   

## 2015-12-13 NOTE — Progress Notes (Signed)
[facility] [phone]) [phone]   [phone]) [phone]  OFFICE PROGRESS NOTE  [name], MD [place] [address]  DIAGNOSIS: Stage IV (T1a, N2, M1b) non-small cell lung cancer, adenocarcinoma with negative EGFR mutation diagnosed in August 2016 and presented with right hilar and subcarinal lymphadenopathy as well as large soft tissue mass in the left posterior iliac bone.  PRIOR THERAPY:  1) Status post palliative radiotherapy to the soft tissue mass in the left iliac bone under the care of Dr. Valere Dross. 2) Systemic chemotherapy with carboplatin for AUC of 5 and Alimta 500 MG/M2 every 3 weeks. First dose expected on 02/22/2015. Status post 6 cycles. Starting from cycle #3 Alimta was reduced to 400 MG/M2 secondary to neutropenia after the first 2 cycles. 3) Maintenance systemic chemotherapy with single agent Alimta 500 MG/M2 every 3 weeks. First dose 07/19/2015. Status post 3 cycles. Last dose was given 08/30/2015 discontinued secondary to disease progression.  CURRENT THERAPY: Immunotherapy treatment with Tecentriq (Atezolizumab) 1200 MG IV every 3 weeks, for his dose 10/14/2015. Status post 3 cycles.  INTERVAL HISTORY: Andrea [name] 45 y.o. female returns to the clinic today for follow-up visit. The patient is feeling fine today with no specific complaints except for increasing pain on the left buttock area. She rated her pain as 8 on a scale from 1-10. She is currently on Percocet 10/325 mg by mouth every 6 hours as needed for pain but not enough for her pain management. It makes her sleepy at time. She tolerated the third cycle of immunotherapy with Tecentriq Huey Bienenstock) fairly well with no significant complaints. She denied having any significant skin rash or diarrhea. She denied having any significant chest pain, shortness of breath, cough or hemoptysis. She denied having any fever or chills. She has no nausea or vomiting. She denied having any  significant weight loss or night sweats. She had repeat CT angiogram of the chest performed recently and she is here for evaluation and discussion of her scan results before starting cycle #4.  MEDICAL HISTORY: Past Medical History:  Diagnosis Date  . 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 Anginal pain ([facility])    chest pain not sure what related to  . Anxiety   . Chronic back pain   . Depression   . Dysmenorrhea 12/06/2009   Qualifier: Diagnosis of  By: [name] RN, Regino Schultze    . Encounter for antineoplastic immunotherapy 10/07/2015  . Family history of adverse reaction to anesthesia    mother stopped breathing after surgery  . Headache    stress  . Low back pain radiating to left leg 11/29/2010  . Non-small cell carcinoma of right lung, stage 4 ([facility]) 01/01/2015  . S/P radiation therapy 01/01/2015 through 01/21/2015     Left iliac bone 3500 cGy in 14 sessions   . Shortness of breath dyspnea    with exertion, Pt denies 02/2015    ALLERGIES:  is allergic to morphine and related and hydrocodone.  MEDICATIONS:  Current Outpatient Prescriptions  Medication Sig Dispense Refill  . cyanocobalamin (,VITAMIN B-12,) 1000 MCG/ML injection Inject 1,000 mcg into the muscle once.    [name] dexamethasone (DECADRON) 4 MG tablet 4 mg po bid, the day before, day of and day after chemo 40 tablet 1  . feeding supplement, ENSURE ENLIVE, (ENSURE ENLIVE) LIQD Take 237 mLs by mouth 3 (three) times daily between meals. 388 mL 12  . folic acid (FOLVITE) 1 MG tablet Take 1  tablet (1 mg total) by mouth daily. 30 tablet 4  . oxyCODONE-acetaminophen (PERCOCET) 10-325 MG tablet Take 1 tablet by mouth every 4 (four) hours as needed for pain. 45 tablet 0  . guaiFENesin-dextromethorphan (ROBITUSSIN DM) 100-10 MG/5ML syrup Take 5 mLs by mouth every 4 (four) hours as needed for cough. Reported on 10/25/2015    . lidocaine-prilocaine (EMLA) cream Apply 1 teaspoon to  skin 1 to 2 hours before use cover to keep in contact with skin 30 g PRN  . loratadine-pseudoephedrine (CLARITIN-D 24-HOUR) 10-240 MG 24 hr tablet Take 1 tablet by mouth daily.    . Multiple Vitamins-Minerals (MULTIVITAMIN & MINERAL PO) Take 2 tablets by mouth daily. Reported on 11/01/2015    . Prenatal Vit-Fe Fumarate-FA (PRENATAL VITAMIN PO) Take 1 tablet by mouth daily. Reported on 11/01/2015    . prochlorperazine (COMPAZINE) 10 MG tablet Take 1 tablet (10 mg total) by mouth every 6 (six) hours as needed for nausea or vomiting. 60 tablet 0  . senna-docusate (SENOKOT-S) 8.6-50 MG per tablet Take 2 tablets by mouth at bedtime. 30 tablet 0   No current facility-administered medications for this visit.     SURGICAL HISTORY:  Past Surgical History:  Procedure Laterality Date  . laproscopy surgery to check her tubes    . LUMBAR LAMINECTOMY/DECOMPRESSION MICRODISCECTOMY Left 05/01/2014   Procedure: Microdiscectomy - L5-S1 - left;  Surgeon: Eustace Moore, MD;  Location: [facility];  Service: Neurosurgery;  Laterality: Left;    REVIEW OF SYSTEMS:  Constitutional: negative Eyes: negative Ears, nose, mouth, throat, and face: negative Respiratory: positive for dyspnea on exertion Cardiovascular: negative Gastrointestinal: negative Genitourinary:negative Integument/breast: negative Hematologic/lymphatic: negative Musculoskeletal:positive for back pain and bone pain Neurological: negative Behavioral/Psych: negative Endocrine: negative Allergic/Immunologic: negative   PHYSICAL EXAMINATION: General appearance: alert, cooperative, fatigued and no distress Head: Normocephalic, without obvious abnormality, atraumatic Neck: no adenopathy, no JVD, supple, symmetrical, trachea midline and thyroid not enlarged, symmetric, no tenderness/mass/nodules Lymph nodes: Cervical, supraclavicular, and axillary nodes normal. Resp: clear to auscultation bilaterally Back: symmetric, no curvature. ROM normal. No CVA  tenderness. Cardio: regular rate and rhythm, S1, S2 normal, no murmur, click, rub or gallop GI: soft, non-tender; bowel sounds normal; no masses,  no organomegaly Extremities: extremities normal, atraumatic, no cyanosis or edema Neurologic: Alert and oriented X 3, normal strength and tone. Normal symmetric reflexes. Normal coordination and gait  ECOG PERFORMANCE STATUS: 1 - Symptomatic but completely ambulatory  Blood pressure 106/70, pulse 98, temperature 97.7 F (36.5 C), temperature source Oral, resp. rate 18, height 5' 6"  (1.676 m), weight 173 lb 14.4 oz (78.9 kg), SpO2 99 %.  LABORATORY DATA: Lab Results  Component Value Date   WBC 4.8 12/10/2015   HGB 12.3 12/10/2015   HCT 36.0 12/10/2015   MCV 92.5 12/10/2015   PLT 450 (H) 12/10/2015      Chemistry      Component Value Date/Time   NA 139 12/10/2015 1201   K 3.7 12/10/2015 1201   CL 111 03/08/2015 1100   CO2 22 12/10/2015 1201   BUN 6.2 (L) 12/10/2015 1201   CREATININE 0.9 12/10/2015 1201      Component Value Date/Time   CALCIUM 9.0 12/10/2015 1201   ALKPHOS 123 12/10/2015 1201   AST 16 12/10/2015 1201   ALT 9 12/10/2015 1201   BILITOT 0.56 12/10/2015 1201       RADIOGRAPHIC STUDIES: Ct Angio Chest Pe W Or Wo Contrast  Result Date: 12/10/2015 CLINICAL DATA:  Shortness of breath and  chest pain.  Lung carcinoma. EXAM: CT ANGIOGRAPHY CHEST WITH CONTRAST TECHNIQUE: Multidetector CT imaging of the chest was performed using the standard protocol during bolus administration of intravenous contrast. Multiplanar CT image reconstructions and MIPs were obtained to evaluate the vascular anatomy. CONTRAST:  65 mL Isovue 370 nonionic COMPARISON:  PET-CT September 30, 2015 FINDINGS: Cardiovascular: There is no demonstrable pulmonary embolus. There is no thoracic aortic aneurysm or dissection. Visualized great vessels appear normal. Port-A-Cath tip is in the superior vena cava near the cavoatrial junction. Pericardium is not thickened.  Mediastinum/Nodes: Visualized thyroid appears normal. A previously noted lymph node in the right paratracheal region which showed increased metabolic activity on the recent PET study measures 1.4 by 1.0 cm, slightly larger than on the recent prior study. No other lymph node prominence is evident. Lungs/Pleura: The mass in the medial aspect of the superior segment right lower lobe is again noted, measuring 1.8 x 1.2 cm. This mass abuts the proximal right lower lobe pulmonary artery. There is patchy atelectatic change in both lung bases. No new parenchymal lung masses evident. A few small bullae are noted in the upper lobes near the apices. Upper Abdomen: There are bilateral adrenal masses, stable. Visualized upper abdominal structures otherwise appear unremarkable. Musculoskeletal: No blastic or lytic bone lesions are evident. Review of the MIP images confirms the above findings. IMPRESSION: No demonstrable pulmonary embolus. Slight increase in size of right paratracheal lymph node which on most recent PET study showed abnormal metabolic activity. The mass in the right lower lobe is again. It may be marginally smaller compared to recent prior study. There is patchy bibasilar atelectasis. Bilateral adrenal masses noted. Electronically Signed   By: Lowella Grip III M.D.   On: 12/10/2015 15:24    ASSESSMENT AND PLAN: This is a very pleasant 45 years old African-American female recently diagnosed with metastatic non-small cell lung cancer, adenocarcinoma with negative EGFR mutation status post palliative radiotherapy to the left iliac bone and soft tissue mass. She is currently undergoing systemic chemotherapy with carboplatin and Alimta status post 6 cycles.  She is currently undergoing maintenance chemotherapy with single agent Alimta status post 3 cycles. She tolerated the first 3 cycles well except for mild nausea. The restaging CT scan of the chest, abdomen and pelvis showed stable disease in the chest  but there was development of left adrenal nodule concerning for possibility of adrenal metastasis. There was also mild increase in the size of the right lower lobe and from a hilar nodule. The recent PET scan confirms evidence of disease progression with new bilateral adrenal metastases in addition to hypermetabolic right paratracheal and right common iliac nodal metastasis. She was started on second line treatment with Tecentriq Huey Bienenstock) status post 3 cycles. She tolerated the last cycle of her treatment well with no significant adverse effects. The recent CT angiogram of the chest showed stable disease except for slight increase in the right paratracheal lymph node. I discussed the scan results with the patient today. I recommended for her to continue on her current treatment with Tecentriq Huey Bienenstock) for 3 more cycles before repeating imaging studies. For the worsening lower back and right hip pain, I will order MRI of the lumbar spine for further evaluation of her bone disease. I will start the patient on fentanyl patch 25 g every 3 days. She will also continue on Percocet for now. For the metastatic bone disease, I would consider the patient for treatment with Xgeva once we receive dental clearance. She will  come back for follow-up visit in 3 weeks for evaluation before starting cycle #5. She was advised to call immediately if she has any concerning symptoms in the interval. The patient voices understanding of current disease status and treatment options and is in agreement with the current care plan.  All questions were answered. The patient knows to call the clinic with any problems, questions or concerns. We can certainly see the patient much sooner if necessary.  Disclaimer: This note was dictated with voice recognition software. Similar sounding words can inadvertently be transcribed and may not be corrected upon review.

## 2015-12-24 ENCOUNTER — Ambulatory Visit (HOSPITAL_COMMUNITY)
Admission: RE | Admit: 2015-12-24 | Discharge: 2015-12-24 | Disposition: A | Discharge status: Home / Self Care | Payer: Medicaid Other | Source: Ambulatory Visit | Attending: Internal Medicine | Admitting: Internal Medicine

## 2015-12-24 ENCOUNTER — Encounter (HOSPITAL_COMMUNITY): Payer: Self-pay

## 2015-12-24 DIAGNOSIS — Z9221 Personal history of antineoplastic chemotherapy: Secondary | ICD-10-CM | POA: Insufficient documentation

## 2015-12-24 DIAGNOSIS — C7971 Secondary malignant neoplasm of right adrenal gland: Secondary | ICD-10-CM | POA: Insufficient documentation

## 2015-12-24 DIAGNOSIS — M899 Disorder of bone, unspecified: Secondary | ICD-10-CM | POA: Diagnosis not present

## 2015-12-24 DIAGNOSIS — C7972 Secondary malignant neoplasm of left adrenal gland: Secondary | ICD-10-CM | POA: Insufficient documentation

## 2015-12-24 DIAGNOSIS — Z5112 Encounter for antineoplastic immunotherapy: Secondary | ICD-10-CM

## 2015-12-24 DIAGNOSIS — C3491 Malignant neoplasm of unspecified part of right bronchus or lung: Secondary | ICD-10-CM | POA: Insufficient documentation

## 2015-12-24 DIAGNOSIS — I700 Atherosclerosis of aorta: Secondary | ICD-10-CM | POA: Diagnosis not present

## 2015-12-24 MED ORDER — IOPAMIDOL (ISOVUE-300) INJECTION 61%
100.0000 mL | Freq: Once | INTRAVENOUS | Status: AC | PRN
  Administered 2015-12-24: 100 mL via INTRAVENOUS

## 2015-12-24 MED ORDER — IOPAMIDOL (ISOVUE-300) INJECTION 61%
30.0000 mL | Freq: Once | INTRAVENOUS | Status: AC | PRN
  Administered 2015-12-24: 30 mL via ORAL

## 2015-12-28 ENCOUNTER — Ambulatory Visit (HOSPITAL_BASED_OUTPATIENT_CLINIC_OR_DEPARTMENT_OTHER): Payer: Medicaid Other

## 2015-12-28 VITALS — BP 111/76 | HR 98 | Temp 97.6°F | Resp 20

## 2015-12-28 DIAGNOSIS — C3491 Malignant neoplasm of unspecified part of right bronchus or lung: Secondary | ICD-10-CM

## 2015-12-28 DIAGNOSIS — C7951 Secondary malignant neoplasm of bone: Secondary | ICD-10-CM

## 2015-12-28 DIAGNOSIS — M898X9 Other specified disorders of bone, unspecified site: Secondary | ICD-10-CM

## 2015-12-28 MED ORDER — DENOSUMAB 120 MG/1.7ML ~~LOC~~ SOLN
120.0000 mg | Freq: Once | SUBCUTANEOUS | Status: AC
  Administered 2015-12-28: 120 mg via SUBCUTANEOUS
  Filled 2015-12-28: qty 1.7

## 2015-12-28 NOTE — Patient Instructions (Signed)
Denosumab injection  What is this medicine?  DENOSUMAB (den oh sue mab) slows bone breakdown. Prolia is used to treat osteoporosis in women after menopause and in men. Xgeva is used to prevent bone fractures and other bone problems caused by cancer bone metastases. Xgeva is also used to treat giant cell tumor of the bone.  This medicine may be used for other purposes; ask your health care provider or pharmacist if you have questions.  What should I tell my health care provider before I take this medicine?  They need to know if you have any of these conditions:  -dental disease  -eczema  -infection or history of infections  -kidney disease or on dialysis  -low blood calcium or vitamin D  -malabsorption syndrome  -scheduled to have surgery or tooth extraction  -taking medicine that contains denosumab  -thyroid or parathyroid disease  -an unusual reaction to denosumab, other medicines, foods, dyes, or preservatives  -pregnant or trying to get pregnant  -breast-feeding  How should I use this medicine?  This medicine is for injection under the skin. It is given by a health care professional in a hospital or clinic setting.  If you are getting Prolia, a special MedGuide will be given to you by the pharmacist with each prescription and refill. Be sure to read this information carefully each time.  For Prolia, talk to your pediatrician regarding the use of this medicine in children. Special care may be needed. For Xgeva, talk to your pediatrician regarding the use of this medicine in children. While this drug may be prescribed for children as young as 13 years for selected conditions, precautions do apply.  Overdosage: If you think you have taken too much of this medicine contact a poison control center or emergency room at once.  NOTE: This medicine is only for you. Do not share this medicine with others.  What if I miss a dose?  It is important not to miss your dose. Call your doctor or health care professional if you are  unable to keep an appointment.  What may interact with this medicine?  Do not take this medicine with any of the following medications:  -other medicines containing denosumab  This medicine may also interact with the following medications:  -medicines that suppress the immune system  -medicines that treat cancer  -steroid medicines like prednisone or cortisone  This list may not describe all possible interactions. Give your health care provider a list of all the medicines, herbs, non-prescription drugs, or dietary supplements you use. Also tell them if you smoke, drink alcohol, or use illegal drugs. Some items may interact with your medicine.  What should I watch for while using this medicine?  Visit your doctor or health care professional for regular checks on your progress. Your doctor or health care professional may order blood tests and other tests to see how you are doing.  Call your doctor or health care professional if you get a cold or other infection while receiving this medicine. Do not treat yourself. This medicine may decrease your body's ability to fight infection.  You should make sure you get enough calcium and vitamin D while you are taking this medicine, unless your doctor tells you not to. Discuss the foods you eat and the vitamins you take with your health care professional.  See your dentist regularly. Brush and floss your teeth as directed. Before you have any dental work done, tell your dentist you are receiving this medicine.  Do   not become pregnant while taking this medicine or for 5 months after stopping it. Women should inform their doctor if they wish to become pregnant or think they might be pregnant. There is a potential for serious side effects to an unborn child. Talk to your health care professional or pharmacist for more information.  What side effects may I notice from receiving this medicine?  Side effects that you should report to your doctor or health care professional as soon as  possible:  -allergic reactions like skin rash, itching or hives, swelling of the face, lips, or tongue  -breathing problems  -chest pain  -fast, irregular heartbeat  -feeling faint or lightheaded, falls  -fever, chills, or any other sign of infection  -muscle spasms, tightening, or twitches  -numbness or tingling  -skin blisters or bumps, or is dry, peels, or red  -slow healing or unexplained pain in the mouth or jaw  -unusual bleeding or bruising  Side effects that usually do not require medical attention (Report these to your doctor or health care professional if they continue or are bothersome.):  -muscle pain  -stomach upset, gas  This list may not describe all possible side effects. Call your doctor for medical advice about side effects. You may report side effects to FDA at 1-800-FDA-1088.  Where should I keep my medicine?  This medicine is only given in a clinic, doctor's office, or other health care setting and will not be stored at home.  NOTE: This sheet is a summary. It may not cover all possible information. If you have questions about this medicine, talk to your doctor, pharmacist, or health care provider.      2016, [employer]vier/[employer] Standard. (2011-10-09 12:37:47)

## 2016-01-03 ENCOUNTER — Encounter: Payer: Self-pay | Admitting: Internal Medicine

## 2016-01-03 ENCOUNTER — Other Ambulatory Visit (HOSPITAL_BASED_OUTPATIENT_CLINIC_OR_DEPARTMENT_OTHER): Payer: Medicaid Other

## 2016-01-03 ENCOUNTER — Other Ambulatory Visit: Payer: Self-pay

## 2016-01-03 ENCOUNTER — Ambulatory Visit: Payer: Medicaid Other

## 2016-01-03 ENCOUNTER — Ambulatory Visit (HOSPITAL_BASED_OUTPATIENT_CLINIC_OR_DEPARTMENT_OTHER): Payer: Medicaid Other | Admitting: Internal Medicine

## 2016-01-03 ENCOUNTER — Ambulatory Visit (HOSPITAL_BASED_OUTPATIENT_CLINIC_OR_DEPARTMENT_OTHER): Payer: Medicaid Other

## 2016-01-03 VITALS — BP 99/72 | HR 106 | Temp 97.8°F | Resp 18 | Ht 66.0 in | Wt 167.6 lb

## 2016-01-03 DIAGNOSIS — M899 Disorder of bone, unspecified: Secondary | ICD-10-CM | POA: Diagnosis not present

## 2016-01-03 DIAGNOSIS — Z5112 Encounter for antineoplastic immunotherapy: Secondary | ICD-10-CM

## 2016-01-03 DIAGNOSIS — Z79899 Other long term (current) drug therapy: Secondary | ICD-10-CM | POA: Diagnosis not present

## 2016-01-03 DIAGNOSIS — Z95828 Presence of other vascular implants and grafts: Secondary | ICD-10-CM | POA: Diagnosis not present

## 2016-01-03 DIAGNOSIS — C7971 Secondary malignant neoplasm of right adrenal gland: Secondary | ICD-10-CM | POA: Diagnosis not present

## 2016-01-03 DIAGNOSIS — C3491 Malignant neoplasm of unspecified part of right bronchus or lung: Secondary | ICD-10-CM

## 2016-01-03 DIAGNOSIS — M898X9 Other specified disorders of bone, unspecified site: Secondary | ICD-10-CM

## 2016-01-03 DIAGNOSIS — C7972 Secondary malignant neoplasm of left adrenal gland: Secondary | ICD-10-CM

## 2016-01-03 DIAGNOSIS — C7951 Secondary malignant neoplasm of bone: Secondary | ICD-10-CM

## 2016-01-03 LAB — CBC WITH DIFFERENTIAL/PLATELET
BASO%: 0.4 % (ref 0.0–2.0)
BASOS ABS: 0 10*3/uL (ref 0.0–0.1)
EOS%: 0.2 % (ref 0.0–7.0)
Eosinophils Absolute: 0 10*3/uL (ref 0.0–0.5)
HCT: 34.3 % — ABNORMAL LOW (ref 34.8–46.6)
HGB: 11.4 g/dL — ABNORMAL LOW (ref 11.6–15.9)
LYMPH%: 30.1 % (ref 14.0–49.7)
MCH: 30.4 pg (ref 25.1–34.0)
MCHC: 33.2 g/dL (ref 31.5–36.0)
MCV: 91.5 fL (ref 79.5–101.0)
MONO#: 0.2 10*3/uL (ref 0.1–0.9)
MONO%: 3.9 % (ref 0.0–14.0)
NEUT#: 3.4 10*3/uL (ref 1.5–6.5)
NEUT%: 65.4 % (ref 38.4–76.8)
Platelets: 548 10*3/uL — ABNORMAL HIGH (ref 145–400)
RBC: 3.75 10*6/uL (ref 3.70–5.45)
RDW: 14 % (ref 11.2–14.5)
WBC: 5.1 10*3/uL (ref 3.9–10.3)
lymph#: 1.5 10*3/uL (ref 0.9–3.3)

## 2016-01-03 LAB — COMPREHENSIVE METABOLIC PANEL
ALT: 29 U/L (ref 0–55)
AST: 36 U/L — AB (ref 5–34)
Albumin: 3 g/dL — ABNORMAL LOW (ref 3.5–5.0)
Alkaline Phosphatase: 136 U/L (ref 40–150)
Anion Gap: 11 mEq/L (ref 3–11)
BUN: 8.8 mg/dL (ref 7.0–26.0)
CHLORIDE: 108 meq/L (ref 98–109)
CO2: 18 meq/L — AB (ref 22–29)
Calcium: 9.1 mg/dL (ref 8.4–10.4)
Creatinine: 1 mg/dL (ref 0.6–1.1)
EGFR: 84 mL/min/{1.73_m2} — ABNORMAL LOW (ref >90)
GLUCOSE: 159 mg/dL — AB (ref 70–140)
POTASSIUM: 3.6 meq/L (ref 3.5–5.1)
SODIUM: 137 meq/L (ref 136–145)
Total Bilirubin: <0.3 mg/dL (ref 0.20–1.20)
Total Protein: 8.5 g/dL — ABNORMAL HIGH (ref 6.4–8.3)

## 2016-01-03 LAB — TSH: TSH: 0.944 m[IU]/L (ref 0.308–3.960)

## 2016-01-03 MED ORDER — OXYCODONE-ACETAMINOPHEN 10-325 MG PO TABS: 1.0000 | ORAL_TABLET | ORAL | 0 refills | Status: DC | PRN

## 2016-01-03 MED ORDER — SODIUM CHLORIDE 0.9% FLUSH
10.0000 mL | INTRAVENOUS | Status: DC | PRN
  Administered 2016-01-03: 10 mL
  Filled 2016-01-03: qty 10

## 2016-01-03 MED ORDER — SODIUM CHLORIDE 0.9 % IV SOLN
1200.0000 mg | Freq: Once | INTRAVENOUS | Status: AC
  Administered 2016-01-03: 1200 mg via INTRAVENOUS
  Filled 2016-01-03: qty 20

## 2016-01-03 MED ORDER — SODIUM CHLORIDE 0.9 % IV SOLN
Freq: Once | INTRAVENOUS | Status: AC
  Administered 2016-01-03: 10:00:00 via INTRAVENOUS

## 2016-01-03 MED ORDER — SODIUM CHLORIDE 0.9 % IJ SOLN
10.0000 mL | INTRAMUSCULAR | Status: DC | PRN
  Administered 2016-01-03: 10 mL via INTRAVENOUS
  Filled 2016-01-03: qty 10

## 2016-01-03 MED ORDER — HEPARIN SOD (PORK) LOCK FLUSH 100 UNIT/ML IV SOLN
500.0000 [IU] | Freq: Once | INTRAVENOUS | Status: AC | PRN
Start: 2016-01-03 — End: 2016-01-03
  Administered 2016-01-03: 500 [IU]
  Filled 2016-01-03: qty 5

## 2016-01-03 MED FILL — OXYCODONE-APAP 10-325 TAB: 10-325 | 7 days supply | Qty: 45 | Fill #0

## 2016-01-03 NOTE — Patient Instructions (Signed)
[facility] Discharge Instructions for Patients Receiving Chemotherapy  Today you received the following chemotherapy agents: Tecentriq  To help prevent nausea and vomiting after your treatment, we encourage you to take your nausea medication as directed.    If you develop nausea and vomiting that is not controlled by your nausea medication, call the clinic.   BELOW ARE SYMPTOMS THAT SHOULD BE REPORTED IMMEDIATELY:  *FEVER GREATER THAN 100.5 F  *CHILLS WITH OR WITHOUT FEVER  NAUSEA AND VOMITING THAT IS NOT CONTROLLED WITH YOUR NAUSEA MEDICATION  *UNUSUAL SHORTNESS OF BREATH  *UNUSUAL BRUISING OR BLEEDING  TENDERNESS IN MOUTH AND THROAT WITH OR WITHOUT PRESENCE OF ULCERS  *URINARY PROBLEMS  *BOWEL PROBLEMS  UNUSUAL RASH Items with * indicate a potential emergency and should be followed up as soon as possible.  Feel free to call the clinic you have any questions or concerns. The clinic phone number is [phone].  Please show the CHEMO ALERT CARD at check-in to the Emergency Department and triage nurse.   

## 2016-01-03 NOTE — Progress Notes (Signed)
[place] [phone]   [phone]  OFFICE PROGRESS NOTE  Andrea Nims, MD [place] [address]  DIAGNOSIS: Stage IV (T1a, N2, M1b) non-small cell lung cancer, adenocarcinoma with negative EGFR mutation diagnosed in August 2016 and presented with right hilar and subcarinal lymphadenopathy as well as large soft tissue mass in the left posterior iliac bone.  PRIOR THERAPY:  1) Status post palliative radiotherapy to the soft tissue mass in the left iliac bone under the care of Dr. Valere Dross. 2) Systemic chemotherapy with carboplatin for AUC of 5 and Alimta 500 MG/M2 every 3 weeks. First dose expected on 02/22/2015. Status post 6 cycles. Starting from cycle #3 Alimta was reduced to 400 MG/M2 secondary to neutropenia after the first 2 cycles. 3) Maintenance systemic chemotherapy with single agent Alimta 500 MG/M2 every 3 weeks. First dose 07/19/2015. Status post 3 cycles. Last dose was given 08/30/2015 discontinued secondary to disease progression.  CURRENT THERAPY: Immunotherapy treatment with Tecentriq (Atezolizumab) 1200 MG IV every 3 weeks, for his dose 10/14/2015. Status post 4 cycles.  INTERVAL HISTORY: Andrea [name] 45 y.o. female returns to the clinic today for follow-up visit. The patient is feeling fine today except for the persistent pain in the hip area. She is currently on fentanyl patch as well as Percocet for breakthrough pain. She tolerated the last cycle of immunotherapy with Tecentriq Andrea [name]) fairly well with no significant complaints. She denied having any significant skin rash or diarrhea. She denied having any significant chest pain, shortness of breath, cough or hemoptysis. She denied having any fever or chills. She has no nausea or vomiting. She denied having any significant weight loss or night sweats. She had repeat CT scan of the abdomen and pelvis performed recently and she is here for evaluation before starting  cycle #5 of her treatment.  MEDICAL HISTORY: Past Medical History:  Diagnosis Date  . Adrenal mass, left ([place]) 09/21/2015  . Anginal pain ([place])    chest pain not sure what related to  . Anxiety   . Chronic back pain   . Depression   . Dysmenorrhea 12/06/2009   Qualifier: Diagnosis of  By: [name] RN, Regino Schultze    . Encounter for antineoplastic immunotherapy 10/07/2015  . Family history of adverse reaction to anesthesia    mother stopped breathing after surgery  . Headache    stress  . Low back pain radiating to left leg 11/29/2010  . Non-small cell carcinoma of right lung, stage 4 ([place]) 01/01/2015  . S/P radiation therapy 01/01/2015 through 01/21/2015     Left iliac bone 3500 cGy in 14 sessions   . Shortness of breath dyspnea    with exertion, Pt denies 02/2015    ALLERGIES:  is allergic to morphine and related and hydrocodone.  MEDICATIONS:  Current Outpatient Prescriptions  Medication Sig Dispense Refill  . cyanocobalamin (,VITAMIN B-12,) 1000 MCG/ML injection Inject 1,000 mcg into the muscle once.    [name] dexamethasone (DECADRON) 4 MG tablet 4 mg po bid, the day before, day of and day after chemo 40 tablet 1  . feeding supplement, ENSURE ENLIVE, (ENSURE ENLIVE) LIQD Take 237 mLs by mouth 3 (three) times daily between meals. 237 mL 12  . fentaNYL (DURAGESIC - DOSED MCG/HR) 25 MCG/HR patch Place 1 patch (25 mcg total) onto the skin every 3 (three) days. 10 patch 0  . folic acid (FOLVITE) 1 MG tablet Take 1 tablet (1 mg total) by mouth  daily. 30 tablet 4  . guaiFENesin-dextromethorphan (ROBITUSSIN DM) 100-10 MG/5ML syrup Take 5 mLs by mouth every 4 (four) hours as needed for cough. Reported on 10/25/2015    . lidocaine-prilocaine (EMLA) cream Apply 1 teaspoon to skin 1 to 2 hours before use cover to keep in contact with skin 30 g PRN  . loratadine-pseudoephedrine (CLARITIN-D 24-HOUR) 10-240 MG 24 hr tablet Take 1 tablet by  mouth daily.    . Multiple Vitamins-Minerals (MULTIVITAMIN & MINERAL PO) Take 2 tablets by mouth daily. Reported on 11/01/2015    . oxyCODONE-acetaminophen (PERCOCET) 10-325 MG tablet Take 1 tablet by mouth every 4 (four) hours as needed for pain. 45 tablet 0  . Prenatal Vit-Fe Fumarate-FA (PRENATAL VITAMIN PO) Take 1 tablet by mouth daily. Reported on 11/01/2015    . prochlorperazine (COMPAZINE) 10 MG tablet Take 1 tablet (10 mg total) by mouth every 6 (six) hours as needed for nausea or vomiting. 60 tablet 0  . senna-docusate (SENOKOT-S) 8.6-50 MG per tablet Take 2 tablets by mouth at bedtime. 30 tablet 0   No current facility-administered medications for this visit.     SURGICAL HISTORY:  Past Surgical History:  Procedure Laterality Date  . laproscopy surgery to check her tubes    . LUMBAR LAMINECTOMY/DECOMPRESSION MICRODISCECTOMY Left 05/01/2014   Procedure: Microdiscectomy - L5-S1 - left;  Surgeon: Eustace Moore, MD;  Location: [facility];  Service: Neurosurgery;  Laterality: Left;    REVIEW OF SYSTEMS:  A comprehensive review of systems was negative except for: Constitutional: positive for fatigue Musculoskeletal: positive for back pain and bone pain   PHYSICAL EXAMINATION: General appearance: alert, cooperative, fatigued and no distress Head: Normocephalic, without obvious abnormality, atraumatic Neck: no adenopathy, no JVD, supple, symmetrical, trachea midline and thyroid not enlarged, symmetric, no tenderness/mass/nodules Lymph nodes: Cervical, supraclavicular, and axillary nodes normal. Resp: clear to auscultation bilaterally Back: symmetric, no curvature. ROM normal. No CVA tenderness. Cardio: regular rate and rhythm, S1, S2 normal, no murmur, click, rub or gallop GI: soft, non-tender; bowel sounds normal; no masses,  no organomegaly Extremities: extremities normal, atraumatic, no cyanosis or edema Neurologic: Alert and oriented X 3, normal strength and tone. Normal symmetric  reflexes. Normal coordination and gait  ECOG PERFORMANCE STATUS: 1 - Symptomatic but completely ambulatory  Blood pressure 99/72, pulse (!) 106, temperature 97.8 F (36.6 C), temperature source Oral, resp. rate 18, height 5' 6"  (1.676 m), weight 167 lb 9.6 oz (76 kg), SpO2 100 %.  LABORATORY DATA: Lab Results  Component Value Date   WBC 4.8 12/10/2015   HGB 12.3 12/10/2015   HCT 36.0 12/10/2015   MCV 92.5 12/10/2015   PLT 450 (H) 12/10/2015      Chemistry      Component Value Date/Time   NA 139 12/10/2015 1201   K 3.7 12/10/2015 1201   CL 111 03/08/2015 1100   CO2 22 12/10/2015 1201   BUN 6.2 (L) 12/10/2015 1201   CREATININE 0.9 12/10/2015 1201      Component Value Date/Time   CALCIUM 9.0 12/10/2015 1201   ALKPHOS 123 12/10/2015 1201   AST 16 12/10/2015 1201   ALT 9 12/10/2015 1201   BILITOT 0.56 12/10/2015 1201       RADIOGRAPHIC STUDIES: Ct Angio Chest Pe W Or Wo Contrast  Result Date: 12/10/2015 CLINICAL DATA:  Shortness of breath and chest pain.  Lung carcinoma. EXAM: CT ANGIOGRAPHY CHEST WITH CONTRAST TECHNIQUE: Multidetector CT imaging of the chest was performed using the standard protocol during  bolus administration of intravenous contrast. Multiplanar CT image reconstructions and MIPs were obtained to evaluate the vascular anatomy. CONTRAST:  65 mL Isovue 370 nonionic COMPARISON:  PET-CT September 30, 2015 FINDINGS: Cardiovascular: There is no demonstrable pulmonary embolus. There is no thoracic aortic aneurysm or dissection. Visualized great vessels appear normal. Port-A-Cath tip is in the superior vena cava near the cavoatrial junction. Pericardium is not thickened. Mediastinum/Nodes: Visualized thyroid appears normal. A previously noted lymph node in the right paratracheal region which showed increased metabolic activity on the recent PET study measures 1.4 by 1.0 cm, slightly larger than on the recent prior study. No other lymph node prominence is evident. Lungs/Pleura:  The mass in the medial aspect of the superior segment right lower lobe is again noted, measuring 1.8 x 1.2 cm. This mass abuts the proximal right lower lobe pulmonary artery. There is patchy atelectatic change in both lung bases. No new parenchymal lung masses evident. A few small bullae are noted in the upper lobes near the apices. Upper Abdomen: There are bilateral adrenal masses, stable. Visualized upper abdominal structures otherwise appear unremarkable. Musculoskeletal: No blastic or lytic bone lesions are evident. Review of the MIP images confirms the above findings. IMPRESSION: No demonstrable pulmonary embolus. Slight increase in size of right paratracheal lymph node which on most recent PET study showed abnormal metabolic activity. The mass in the right lower lobe is again. It may be marginally smaller compared to recent prior study. There is patchy bibasilar atelectasis. Bilateral adrenal masses noted. Electronically Signed   By: Lowella Grip III M.D.   On: 12/10/2015 15:24   Ct Abdomen Pelvis W Contrast  Result Date: 12/24/2015 CLINICAL DATA:  History of right lung cancer status post XRT and chemo. EXAM: CT ABDOMEN AND PELVIS WITH CONTRAST TECHNIQUE: Multidetector CT imaging of the abdomen and pelvis was performed using the standard protocol following bolus administration of intravenous contrast. CONTRAST:  152m ISOVUE-300 IOPAMIDOL (ISOVUE-300) INJECTION 61% COMPARISON:  PET CTs 09/30/2015. Abdomen and pelvis CT from 09/17/2015. FINDINGS: Lower chest:  Subsegmental atelectasis noted in the lung bases. Hepatobiliary: Tiny hypo attenuating lesion in the dome of the liver is stable, likely reflecting a tiny cyst. Liver parenchyma otherwise unremarkable. There is no evidence for gallstones, gallbladder wall thickening, or pericholecystic fluid. No intrahepatic or extrahepatic biliary dilation. Pancreas: No focal mass lesion. No dilatation of the main duct. No intraparenchymal cyst. No peripancreatic  edema. Spleen: No splenomegaly. No focal mass lesion. Adrenals/Urinary Tract: Known right adrenal lesion measures 3.0 x 1.5 cm. There has been marked progression of the necrotic left adrenal lesion since previous abdomen and pelvis CT measuring 2.1 x 4.2 cm today which compares to 2.0 x 1.0 on 09/17/2015. Small well-defined low-density lesions in the kidneys are relatively stable likely represent cysts. No evidence for hydroureter. The urinary bladder appears normal for the degree of distention. Stomach/Bowel: Stomach is nondistended. No gastric wall thickening. No evidence of outlet obstruction. Duodenum is normally positioned as is the ligament of Treitz. No small bowel wall thickening. No small bowel dilatation. The terminal ileum is normal. The appendix is normal. No gross colonic mass. No colonic wall thickening. No substantial diverticular change. Vascular/Lymphatic: There is abdominal aortic atherosclerosis without aneurysm. There is no gastrohepatic or hepatoduodenal ligament lymphadenopathy. No intraperitoneal or retroperitoneal lymphadenopathy. No pelvic sidewall lymphadenopathy. Other: The uterus has normal CT imaging appearance. There is no adnexal mass. No intraperitoneal free fluid. Musculoskeletal: Mixed lytic and sclerotic lesion posterior left iliac bone is unchanged in the interval.  IMPRESSION: 1. Bilateral adrenal metastases, progressed since prior abdominal and pelvis CT but not substantially changed since more recent chest CT. 2. Stable appearance mixed lytic and sclerotic lesion posterior left iliac bone. 3. No other evidence for metastatic disease in the abdomen or pelvis. 4. Abdominal aortic atherosclerosis. Electronically Signed   By: Misty Stanley M.D.   On: 12/24/2015 10:02    ASSESSMENT AND PLAN: This is a very pleasant 45 years old African-American female recently diagnosed with metastatic non-small cell lung cancer, adenocarcinoma with negative EGFR mutation status post palliative  radiotherapy to the left iliac bone and soft tissue mass. She is currently undergoing systemic chemotherapy with carboplatin and Alimta status post 6 cycles.  She is currently undergoing maintenance chemotherapy with single agent Alimta status post 3 cycles. She tolerated the first 3 cycles well except for mild nausea. The restaging CT scan of the chest, abdomen and pelvis showed stable disease in the chest but there was development of left adrenal nodule concerning for possibility of adrenal metastasis. There was also mild increase in the size of the right lower lobe and from a hilar nodule. The recent PET scan confirms evidence of disease progression with new bilateral adrenal metastases in addition to hypermetabolic right paratracheal and right common iliac nodal metastasis. She was started on second line treatment with Tecentriq (Atezolizumab) status post 4 cycles. She tolerated the last cycle of her treatment well with no significant adverse effects. The recent CT scan of the abdomen and pelvis showed no evidence for disease progression compared to the last imaging studies. I recommended for the patient to proceed with cycle #5 today as a scheduled. For the pain management, I recommended for the patient to continue with the fentanyl patch every 3 days in addition to Percocet for breakthrough pain. She was given a refill for Percocet today. For the metastatic bone disease, I would consider the patient for treatment with Xgeva once we receive dental clearance. She will come back for follow-up visit in 3 weeks for evaluation before starting cycle #6. She was advised to call immediately if she has any concerning symptoms in the interval. The patient voices understanding of current disease status and treatment options and is in agreement with the current care plan.  All questions were answered. The patient knows to call the clinic with any problems, questions or concerns. We can certainly see the  patient much sooner if necessary.  Disclaimer: This note was dictated with voice recognition software. Similar sounding words can inadvertently be transcribed and may not be corrected upon review.

## 2016-01-04 LAB — T3 UPTAKE
Free Thyroxine Index: 2.5 (ref 1.2–4.9)
T3 Uptake Ratio: 24 % (ref 24–39)

## 2016-01-04 LAB — T4, FREE: T4,Free(Direct): 1.35 ng/dL (ref 0.82–1.77)

## 2016-01-04 LAB — T4: T4 TOTAL: 10.4 ug/dL (ref 4.5–12.0)

## 2016-01-12 ENCOUNTER — Telehealth: Payer: Self-pay | Admitting: *Deleted

## 2016-01-12 NOTE — Telephone Encounter (Signed)
Received call from pt asking for refills on percocet & fentynyl patches.  She states she will be out tomorrow & would like to p/u tomorrow.  Message to Dr Mohamed/Pod RN

## 2016-01-13 ENCOUNTER — Other Ambulatory Visit: Payer: Self-pay | Admitting: Oncology

## 2016-01-13 DIAGNOSIS — C7951 Secondary malignant neoplasm of bone: Secondary | ICD-10-CM

## 2016-01-13 DIAGNOSIS — Z5112 Encounter for antineoplastic immunotherapy: Secondary | ICD-10-CM

## 2016-01-13 DIAGNOSIS — C3491 Malignant neoplasm of unspecified part of right bronchus or lung: Secondary | ICD-10-CM

## 2016-01-13 DIAGNOSIS — M898X9 Other specified disorders of bone, unspecified site: Secondary | ICD-10-CM

## 2016-01-13 DIAGNOSIS — Z95828 Presence of other vascular implants and grafts: Secondary | ICD-10-CM

## 2016-01-13 MED ORDER — OXYCODONE-ACETAMINOPHEN 10-325 MG PO TABS: 1.0000 | ORAL_TABLET | ORAL | 0 refills | Status: DC | PRN

## 2016-01-13 MED ORDER — FENTANYL 25 MCG/HR TD PT72: 25.0000 ug | MEDICATED_PATCH | TRANSDERMAL | 0 refills | Status: DC

## 2016-01-13 MED FILL — fentaNYL 25 MCG/HR PT72: 25 | 30 days supply | Qty: 10 | Fill #0

## 2016-01-13 MED FILL — OXYCODONE-ACETAMINOPHEN 10-: 10-325 | 8 days supply | Qty: 45 | Fill #0

## 2016-01-24 ENCOUNTER — Ambulatory Visit (HOSPITAL_BASED_OUTPATIENT_CLINIC_OR_DEPARTMENT_OTHER): Payer: Medicaid Other | Admitting: Internal Medicine

## 2016-01-24 ENCOUNTER — Ambulatory Visit: Payer: Medicaid Other

## 2016-01-24 ENCOUNTER — Encounter: Payer: Self-pay | Admitting: Internal Medicine

## 2016-01-24 ENCOUNTER — Other Ambulatory Visit (HOSPITAL_BASED_OUTPATIENT_CLINIC_OR_DEPARTMENT_OTHER): Payer: Medicaid Other

## 2016-01-24 ENCOUNTER — Telehealth: Payer: Self-pay | Admitting: Internal Medicine

## 2016-01-24 ENCOUNTER — Ambulatory Visit (HOSPITAL_BASED_OUTPATIENT_CLINIC_OR_DEPARTMENT_OTHER): Payer: Medicaid Other

## 2016-01-24 DIAGNOSIS — C3491 Malignant neoplasm of unspecified part of right bronchus or lung: Secondary | ICD-10-CM

## 2016-01-24 DIAGNOSIS — M25551 Pain in right hip: Secondary | ICD-10-CM | POA: Diagnosis not present

## 2016-01-24 DIAGNOSIS — Z5112 Encounter for antineoplastic immunotherapy: Secondary | ICD-10-CM | POA: Diagnosis present

## 2016-01-24 DIAGNOSIS — Z79899 Other long term (current) drug therapy: Secondary | ICD-10-CM | POA: Diagnosis not present

## 2016-01-24 DIAGNOSIS — C7951 Secondary malignant neoplasm of bone: Secondary | ICD-10-CM

## 2016-01-24 DIAGNOSIS — Z95828 Presence of other vascular implants and grafts: Secondary | ICD-10-CM

## 2016-01-24 DIAGNOSIS — M898X9 Other specified disorders of bone, unspecified site: Secondary | ICD-10-CM

## 2016-01-24 DIAGNOSIS — R5382 Chronic fatigue, unspecified: Secondary | ICD-10-CM

## 2016-01-24 LAB — COMPREHENSIVE METABOLIC PANEL
ALBUMIN: 3.1 g/dL — AB (ref 3.5–5.0)
ALT: 27 U/L (ref 0–55)
AST: 35 U/L — AB (ref 5–34)
Alkaline Phosphatase: 191 U/L — ABNORMAL HIGH (ref 40–150)
Anion Gap: 13 mEq/L — ABNORMAL HIGH (ref 3–11)
BUN: 7.4 mg/dL (ref 7.0–26.0)
CHLORIDE: 105 meq/L (ref 98–109)
CO2: 19 meq/L — AB (ref 22–29)
Calcium: 9.3 mg/dL (ref 8.4–10.4)
Creatinine: 0.8 mg/dL (ref 0.6–1.1)
EGFR: >90 mL/min/{1.73_m2} (ref >90)
GLUCOSE: 118 mg/dL (ref 70–140)
POTASSIUM: 3.6 meq/L (ref 3.5–5.1)
SODIUM: 136 meq/L (ref 136–145)
Total Bilirubin: 0.33 mg/dL (ref 0.20–1.20)
Total Protein: 8.5 g/dL — ABNORMAL HIGH (ref 6.4–8.3)

## 2016-01-24 LAB — CBC WITH DIFFERENTIAL/PLATELET
BASO%: 1 % (ref 0.0–2.0)
BASOS ABS: 0 10*3/uL (ref 0.0–0.1)
EOS%: 0.2 % (ref 0.0–7.0)
Eosinophils Absolute: 0 10*3/uL (ref 0.0–0.5)
HCT: 37.2 % (ref 34.8–46.6)
HEMOGLOBIN: 12.1 g/dL (ref 11.6–15.9)
LYMPH%: 24.2 % (ref 14.0–49.7)
MCH: 29.1 pg (ref 25.1–34.0)
MCHC: 32.7 g/dL (ref 31.5–36.0)
MCV: 89.2 fL (ref 79.5–101.0)
MONO#: 0.5 10*3/uL (ref 0.1–0.9)
MONO%: 9.2 % (ref 0.0–14.0)
NEUT#: 3.2 10*3/uL (ref 1.5–6.5)
NEUT%: 65.4 % (ref 38.4–76.8)
Platelets: 539 10*3/uL — ABNORMAL HIGH (ref 145–400)
RBC: 4.17 10*6/uL (ref 3.70–5.45)
RDW: 14.9 % — AB (ref 11.2–14.5)
WBC: 4.9 10*3/uL (ref 3.9–10.3)
lymph#: 1.2 10*3/uL (ref 0.9–3.3)

## 2016-01-24 LAB — TSH: TSH: 0.885 m(IU)/L (ref 0.308–3.960)

## 2016-01-24 MED ORDER — DENOSUMAB 120 MG/1.7ML ~~LOC~~ SOLN
120.0000 mg | Freq: Once | SUBCUTANEOUS | Status: AC
  Administered 2016-01-24: 120 mg via SUBCUTANEOUS
  Filled 2016-01-24: qty 1.7

## 2016-01-24 MED ORDER — OXYCODONE-ACETAMINOPHEN 10-325 MG PO TABS: 1.0000 | ORAL_TABLET | ORAL | 0 refills | Status: DC | PRN

## 2016-01-24 MED ORDER — ALTEPLASE 2 MG IJ SOLR
2.0000 mg | Freq: Once | INTRAMUSCULAR | Status: DC | PRN
  Filled 2016-01-24: qty 2

## 2016-01-24 MED ORDER — HEPARIN SOD (PORK) LOCK FLUSH 100 UNIT/ML IV SOLN
500.0000 [IU] | Freq: Once | INTRAVENOUS | Status: DC | PRN
  Filled 2016-01-24: qty 5

## 2016-01-24 MED ORDER — ATEZOLIZUMAB CHEMO INJECTION 1200 MG/20ML
1200.0000 mg | Freq: Once | INTRAVENOUS | Status: AC
  Administered 2016-01-24: 1200 mg via INTRAVENOUS
  Filled 2016-01-24: qty 20

## 2016-01-24 MED ORDER — SODIUM CHLORIDE 0.9% FLUSH
10.0000 mL | INTRAVENOUS | Status: DC | PRN
  Administered 2016-01-24: 10 mL
  Filled 2016-01-24: qty 10

## 2016-01-24 MED ORDER — SODIUM CHLORIDE 0.9 % IV SOLN
Freq: Once | INTRAVENOUS | Status: AC
  Administered 2016-01-24: 10:00:00 via INTRAVENOUS

## 2016-01-24 MED ORDER — HEPARIN SOD (PORK) LOCK FLUSH 100 UNIT/ML IV SOLN
500.0000 [IU] | Freq: Once | INTRAVENOUS | Status: AC | PRN
  Administered 2016-01-24: 500 [IU]
  Filled 2016-01-24: qty 5

## 2016-01-24 MED ORDER — SODIUM CHLORIDE 0.9 % IJ SOLN
10.0000 mL | INTRAMUSCULAR | Status: DC | PRN
  Administered 2016-01-24: 10 mL via INTRAVENOUS
  Filled 2016-01-24: qty 10

## 2016-01-24 MED FILL — OXYCODONE-ACETAMINOPHEN 10-: 10-325 | 8 days supply | Qty: 45 | Fill #0

## 2016-01-24 NOTE — Progress Notes (Signed)
[facility] [phone]   [phone]  OFFICE PROGRESS NOTE  [name], MD [place] [address]  DIAGNOSIS: Stage IV (T1a, N2, M1b) non-small cell lung cancer, adenocarcinoma with negative EGFR mutation diagnosed in August 2016 and presented with right hilar and subcarinal lymphadenopathy as well as large soft tissue mass in the left posterior iliac bone.  PRIOR THERAPY:  1) Status post palliative radiotherapy to the soft tissue mass in the left iliac bone under the care of Dr. Valere Dross. 2) Systemic chemotherapy with carboplatin for AUC of 5 and Alimta 500 MG/M2 every 3 weeks. First dose expected on 02/22/2015. Status post 6 cycles. Starting from cycle #3 Alimta was reduced to 400 MG/M2 secondary to neutropenia after the first 2 cycles. 3) Maintenance systemic chemotherapy with single agent Alimta 500 MG/M2 every 3 weeks. First dose 07/19/2015. Status post 3 cycles. Last dose was given 08/30/2015 discontinued secondary to disease progression.  CURRENT THERAPY: Immunotherapy treatment with Tecentriq (Atezolizumab) 1200 MG IV every 3 weeks, for his dose 10/14/2015. Status post 5 cycles.  INTERVAL HISTORY: Andrea [name] 45 y.o. female returns to the clinic today for follow-up visit. The patient is feeling fine today except for the persistent pain in the hip area. She is currently on fentanyl patch as well as Percocet for breakthrough pain. She tolerated the last cycle of immunotherapy with Tecentriq Huey Bienenstock) fairly well with no significant complaints. She denied having any significant skin rash or diarrhea. She denied having any significant chest pain, shortness of breath, cough or hemoptysis. She denied having any fever or chills. She has no nausea or vomiting. She is here today for evaluation before starting cycle #6.  MEDICAL HISTORY: Past Medical History:  Diagnosis Date  . Adrenal mass, left ([place]) 09/21/2015  . Anginal pain ([place])     chest pain not sure what related to  . Anxiety   . Chronic back pain   . Depression   . Dysmenorrhea 12/06/2009   Qualifier: Diagnosis of  By: [name] RN, Regino Schultze    . Encounter for antineoplastic immunotherapy 10/07/2015  . Family history of adverse reaction to anesthesia    mother stopped breathing after surgery  . Headache    stress  . Low back pain radiating to left leg 11/29/2010  . Non-small cell carcinoma of right lung, stage 4 ([place]) 01/01/2015  . S/P radiation therapy 01/01/2015 through 01/21/2015     Left iliac bone 3500 cGy in 14 sessions   . Shortness of breath dyspnea    with exertion, Pt denies 02/2015    ALLERGIES:  is allergic to morphine and related and hydrocodone.  MEDICATIONS:  Current Outpatient Prescriptions  Medication Sig Dispense Refill  . cyanocobalamin (,VITAMIN B-12,) 1000 MCG/ML injection Inject 1,000 mcg into the muscle once.    [name] dexamethasone (DECADRON) 4 MG tablet 4 mg po bid, the day before, day of and day after chemo 40 tablet 1  . feeding supplement, ENSURE ENLIVE, (ENSURE ENLIVE) LIQD Take 237 mLs by mouth 3 (three) times daily between meals. 237 mL 12  . fentaNYL (DURAGESIC - DOSED MCG/HR) 25 MCG/HR patch Place 1 patch (25 mcg total) onto the skin every 3 (three) days. 10 patch 0  . folic acid (FOLVITE) 1 MG tablet Take 1 tablet (1 mg total) by mouth daily. 30 tablet 4  . guaiFENesin-dextromethorphan (ROBITUSSIN DM) 100-10 MG/5ML syrup Take 5 mLs by mouth every 4 (four) hours as needed for cough.  Reported on 10/25/2015    . lidocaine-prilocaine (EMLA) cream Apply 1 teaspoon to skin 1 to 2 hours before use cover to keep in contact with skin 30 g PRN  . loratadine-pseudoephedrine (CLARITIN-D 24-HOUR) 10-240 MG 24 hr tablet Take 1 tablet by mouth daily.    . Multiple Vitamins-Minerals (MULTIVITAMIN & MINERAL PO) Take 2 tablets by mouth daily. Reported on 11/01/2015    .  oxyCODONE-acetaminophen (PERCOCET) 10-325 MG tablet Take 1 tablet by mouth every 4 (four) hours as needed for pain. 45 tablet 0  . Prenatal Vit-Fe Fumarate-FA (PRENATAL VITAMIN PO) Take 1 tablet by mouth daily. Reported on 11/01/2015    . senna-docusate (SENOKOT-S) 8.6-50 MG per tablet Take 2 tablets by mouth at bedtime. 30 tablet 0  . prochlorperazine (COMPAZINE) 10 MG tablet Take 1 tablet (10 mg total) by mouth every 6 (six) hours as needed for nausea or vomiting. (Patient not taking: Reported on 01/24/2016) 60 tablet 0   No current facility-administered medications for this visit.     SURGICAL HISTORY:  Past Surgical History:  Procedure Laterality Date  . laproscopy surgery to check her tubes    . LUMBAR LAMINECTOMY/DECOMPRESSION MICRODISCECTOMY Left 05/01/2014   Procedure: Microdiscectomy - L5-S1 - left;  Surgeon: Eustace Moore, MD;  Location: [facility];  Service: Neurosurgery;  Laterality: Left;    REVIEW OF SYSTEMS:  A comprehensive review of systems was negative except for: Constitutional: positive for fatigue Musculoskeletal: positive for back pain and bone pain   PHYSICAL EXAMINATION: General appearance: alert, cooperative, fatigued and no distress Head: Normocephalic, without obvious abnormality, atraumatic Neck: no adenopathy, no JVD, supple, symmetrical, trachea midline and thyroid not enlarged, symmetric, no tenderness/mass/nodules Lymph nodes: Cervical, supraclavicular, and axillary nodes normal. Resp: clear to auscultation bilaterally Back: symmetric, no curvature. ROM normal. No CVA tenderness. Cardio: regular rate and rhythm, S1, S2 normal, no murmur, click, rub or gallop GI: soft, non-tender; bowel sounds normal; no masses,  no organomegaly Extremities: extremities normal, atraumatic, no cyanosis or edema Neurologic: Alert and oriented X 3, normal strength and tone. Normal symmetric reflexes. Normal coordination and gait  ECOG PERFORMANCE STATUS: 1 - Symptomatic but completely  ambulatory  Blood pressure 121/88, pulse (!) 105, temperature 97.5 F (36.4 C), temperature source Oral, resp. rate 18, height 5' 6"  (1.676 m), weight 160 lb 1.6 oz (72.6 kg), SpO2 98 %.  LABORATORY DATA: Lab Results  Component Value Date   WBC 4.9 01/24/2016   HGB 12.1 01/24/2016   HCT 37.2 01/24/2016   MCV 89.2 01/24/2016   PLT 539 (H) 01/24/2016      Chemistry      Component Value Date/Time   NA 136 01/24/2016 0842   K 3.6 01/24/2016 0842   CL 111 03/08/2015 1100   CO2 19 (L) 01/24/2016 0842   BUN 7.4 01/24/2016 0842   CREATININE 0.8 01/24/2016 0842      Component Value Date/Time   CALCIUM 9.3 01/24/2016 0842   ALKPHOS 191 (H) 01/24/2016 0842   AST 35 (H) 01/24/2016 0842   ALT 27 01/24/2016 0842   BILITOT 0.33 01/24/2016 0842       RADIOGRAPHIC STUDIES: No results found.  ASSESSMENT AND PLAN: This is a very pleasant 44 years old African-American female recently diagnosed with metastatic non-small cell lung cancer, adenocarcinoma with negative EGFR mutation status post palliative radiotherapy to the left iliac bone and soft tissue mass. She is currently undergoing systemic chemotherapy with carboplatin and Alimta status post 6 cycles.  She is currently  undergoing maintenance chemotherapy with single agent Alimta status post 3 cycles. She tolerated the first 3 cycles well except for mild nausea. The restaging CT scan of the chest, abdomen and pelvis showed stable disease in the chest but there was development of left adrenal nodule concerning for possibility of adrenal metastasis. There was also mild increase in the size of the right lower lobe and from a hilar nodule. The recent PET scan confirms evidence of disease progression with new bilateral adrenal metastases in addition to hypermetabolic right paratracheal and right common iliac nodal metastasis. She was started on second line treatment with Tecentriq (Atezolizumab) status post 5 cycles. She tolerated the last  cycle of her treatment well with no significant adverse effects. I recommended for the patient to proceed with cycle #6 today as a scheduled. For the pain management, I recommended for the patient to continue with the fentanyl patch every 3 days in addition to Percocet for breakthrough pain. I gave her a refill for Percocet. She will come back for follow-up visit in 3 weeks for evaluation before starting cycle #7 after repeating CT scan of the chest, abdomen and pelvis for restaging of her disease. She was advised to call immediately if she has any concerning symptoms in the interval. The patient voices understanding of current disease status and treatment options and is in agreement with the current care plan.  All questions were answered. The patient knows to call the clinic with any problems, questions or concerns. We can certainly see the patient much sooner if necessary.  Disclaimer: This note was dictated with voice recognition software. Similar sounding words can inadvertently be transcribed and may not be corrected upon review.

## 2016-01-24 NOTE — Telephone Encounter (Signed)
Gave patient avs report and appointments for October and November. Patient already on schedule for lab/fu/tx q3w until last date in care plan 11/13. Central radiology will call re scan - patient aware and will discuss drinking water based prep when scheduling calls.

## 2016-01-24 NOTE — Patient Instructions (Signed)
[facility] Discharge Instructions for Patients Receiving Chemotherapy  Today you received the following chemotherapy agents Tecentriq.  To help prevent nausea and vomiting after your treatment, we encourage you to take your nausea medication as directed.   If you develop nausea and vomiting that is not controlled by your nausea medication, call the clinic.   BELOW ARE SYMPTOMS THAT SHOULD BE REPORTED IMMEDIATELY:  *FEVER GREATER THAN 100.5 F  *CHILLS WITH OR WITHOUT FEVER  NAUSEA AND VOMITING THAT IS NOT CONTROLLED WITH YOUR NAUSEA MEDICATION  *UNUSUAL SHORTNESS OF BREATH  *UNUSUAL BRUISING OR BLEEDING  TENDERNESS IN MOUTH AND THROAT WITH OR WITHOUT PRESENCE OF ULCERS  *URINARY PROBLEMS  *BOWEL PROBLEMS  UNUSUAL RASH Items with * indicate a potential emergency and should be followed up as soon as possible.  Feel free to call the clinic you have any questions or concerns. The clinic phone number is [phone].  Please show the [facility] at check-in to the Emergency Department and triage nurse.

## 2016-01-25 ENCOUNTER — Ambulatory Visit: Payer: Self-pay

## 2016-02-01 ENCOUNTER — Ambulatory Visit (HOSPITAL_BASED_OUTPATIENT_CLINIC_OR_DEPARTMENT_OTHER): Payer: Medicaid Other

## 2016-02-01 ENCOUNTER — Telehealth: Payer: Self-pay | Admitting: Hematology

## 2016-02-01 ENCOUNTER — Encounter (HOSPITAL_COMMUNITY): Payer: Self-pay

## 2016-02-01 ENCOUNTER — Ambulatory Visit (HOSPITAL_BASED_OUTPATIENT_CLINIC_OR_DEPARTMENT_OTHER): Payer: Medicaid Other | Admitting: Nurse Practitioner

## 2016-02-01 ENCOUNTER — Other Ambulatory Visit: Payer: Self-pay | Admitting: Medical Oncology

## 2016-02-01 ENCOUNTER — Inpatient Hospital Stay (HOSPITAL_COMMUNITY)
Admission: EM | Admit: 2016-02-01 | Discharge: 2016-02-04 | DRG: 054 | Disposition: A | Discharge status: Home Health | Payer: Medicaid Other | Source: Ambulatory Visit | Attending: Family Medicine | Admitting: Family Medicine

## 2016-02-01 ENCOUNTER — Other Ambulatory Visit: Payer: Self-pay | Admitting: *Deleted

## 2016-02-01 VITALS — BP 142/71 | HR 72 | Temp 98.0°F | Resp 18 | Ht 66.0 in

## 2016-02-01 DIAGNOSIS — R569 Unspecified convulsions: Secondary | ICD-10-CM | POA: Diagnosis not present

## 2016-02-01 DIAGNOSIS — C7971 Secondary malignant neoplasm of right adrenal gland: Secondary | ICD-10-CM

## 2016-02-01 DIAGNOSIS — C3491 Malignant neoplasm of unspecified part of right bronchus or lung: Secondary | ICD-10-CM

## 2016-02-01 DIAGNOSIS — C7931 Secondary malignant neoplasm of brain: Secondary | ICD-10-CM | POA: Diagnosis present

## 2016-02-01 DIAGNOSIS — Z923 Personal history of irradiation: Secondary | ICD-10-CM

## 2016-02-01 DIAGNOSIS — E860 Dehydration: Secondary | ICD-10-CM

## 2016-02-01 DIAGNOSIS — G936 Cerebral edema: Secondary | ICD-10-CM | POA: Diagnosis present

## 2016-02-01 DIAGNOSIS — G939 Disorder of brain, unspecified: Secondary | ICD-10-CM | POA: Diagnosis not present

## 2016-02-01 DIAGNOSIS — Z85118 Personal history of other malignant neoplasm of bronchus and lung: Secondary | ICD-10-CM | POA: Diagnosis not present

## 2016-02-01 DIAGNOSIS — R5382 Chronic fatigue, unspecified: Secondary | ICD-10-CM | POA: Diagnosis present

## 2016-02-01 DIAGNOSIS — Z87891 Personal history of nicotine dependence: Secondary | ICD-10-CM | POA: Diagnosis not present

## 2016-02-01 DIAGNOSIS — C7951 Secondary malignant neoplasm of bone: Secondary | ICD-10-CM

## 2016-02-01 DIAGNOSIS — C7972 Secondary malignant neoplasm of left adrenal gland: Secondary | ICD-10-CM | POA: Diagnosis not present

## 2016-02-01 DIAGNOSIS — G4089 Other seizures: Secondary | ICD-10-CM

## 2016-02-01 DIAGNOSIS — R9089 Other abnormal findings on diagnostic imaging of central nervous system: Secondary | ICD-10-CM | POA: Diagnosis present

## 2016-02-01 LAB — CBC WITH DIFFERENTIAL/PLATELET
BASO%: 0.5 % (ref 0.0–2.0)
Basophils Absolute: 0 10*3/uL (ref 0.0–0.1)
EOS%: 0.2 % (ref 0.0–7.0)
Eosinophils Absolute: 0 10*3/uL (ref 0.0–0.5)
HCT: 39.3 % (ref 34.8–46.6)
HEMOGLOBIN: 12.9 g/dL (ref 11.6–15.9)
LYMPH%: 18.7 % (ref 14.0–49.7)
MCH: 28.9 pg (ref 25.1–34.0)
MCHC: 32.8 g/dL (ref 31.5–36.0)
MCV: 88.1 fL (ref 79.5–101.0)
MONO#: 0.4 10*3/uL (ref 0.1–0.9)
MONO%: 7 % (ref 0.0–14.0)
NEUT%: 73.6 % (ref 38.4–76.8)
NEUTROS ABS: 3.9 10*3/uL (ref 1.5–6.5)
Platelets: 521 10*3/uL — ABNORMAL HIGH (ref 145–400)
RBC: 4.46 10*6/uL (ref 3.70–5.45)
RDW: 15.6 % — AB (ref 11.2–14.5)
WBC: 5.3 10*3/uL (ref 3.9–10.3)
lymph#: 1 10*3/uL (ref 0.9–3.3)

## 2016-02-01 LAB — COMPREHENSIVE METABOLIC PANEL
ALK PHOS: 198 U/L — AB (ref 40–150)
ALT: 16 U/L (ref 0–55)
AST: 17 U/L (ref 5–34)
Albumin: 3.1 g/dL — ABNORMAL LOW (ref 3.5–5.0)
Anion Gap: 11 mEq/L (ref 3–11)
BUN: 4.7 mg/dL — ABNORMAL LOW (ref 7.0–26.0)
CHLORIDE: 105 meq/L (ref 98–109)
CO2: 20 mEq/L — ABNORMAL LOW (ref 22–29)
Calcium: 8.8 mg/dL (ref 8.4–10.4)
Creatinine: 0.7 mg/dL (ref 0.6–1.1)
Glucose: 124 mg/dl (ref 70–140)
Potassium: 4 mEq/L (ref 3.5–5.1)
SODIUM: 136 meq/L (ref 136–145)
TOTAL PROTEIN: 8.6 g/dL — AB (ref 6.4–8.3)
Total Bilirubin: 0.4 mg/dL (ref 0.20–1.20)

## 2016-02-01 MED ORDER — LORATADINE-PSEUDOEPHEDRINE ER 10-240 MG PO TB24: 1.0000 | ORAL_TABLET | Freq: Every day | ORAL | Status: DC

## 2016-02-01 MED ORDER — GUAIFENESIN-DM 100-10 MG/5ML PO SYRP: 5.0000 mL | ORAL_SOLUTION | ORAL | Status: DC | PRN

## 2016-02-01 MED ORDER — SODIUM CHLORIDE 0.9 % IV SOLN
Freq: Once | INTRAVENOUS | Status: AC
  Administered 2016-02-01: 13:00:00 via INTRAVENOUS

## 2016-02-01 MED ORDER — GADOBENATE DIMEGLUMINE 529 MG/ML IV SOLN
15.0000 mL | Freq: Once | INTRAVENOUS | Status: AC | PRN
  Administered 2016-02-01: 15 mL via INTRAVENOUS

## 2016-02-01 MED ORDER — SODIUM CHLORIDE 0.9 % IV SOLN: 250.0000 mL | INTRAVENOUS | Status: DC | PRN

## 2016-02-01 MED ORDER — LORATADINE 10 MG PO TABS
10.0000 mg | ORAL_TABLET | Freq: Every day | ORAL | Status: DC
  Administered 2016-02-02 – 2016-02-04 (×3): 10 mg via ORAL
  Filled 2016-02-01 (×3): qty 1

## 2016-02-01 MED ORDER — PSEUDOEPHEDRINE HCL ER 120 MG PO TB12
120.0000 mg | ORAL_TABLET | Freq: Two times a day (BID) | ORAL | Status: DC
  Administered 2016-02-02: 120 mg via ORAL
  Filled 2016-02-01: qty 1

## 2016-02-01 MED ORDER — DEXAMETHASONE SODIUM PHOSPHATE 4 MG/ML IJ SOLN
4.0000 mg | Freq: Four times a day (QID) | INTRAMUSCULAR | Status: AC
  Administered 2016-02-02 – 2016-02-04 (×11): 4 mg via INTRAVENOUS
  Filled 2016-02-01 (×11): qty 1

## 2016-02-01 MED ORDER — SODIUM CHLORIDE 0.9% FLUSH
10.0000 mL | Freq: Two times a day (BID) | INTRAVENOUS | Status: DC
  Administered 2016-02-02: 10 mL

## 2016-02-01 MED ORDER — SODIUM CHLORIDE 0.9% FLUSH
3.0000 mL | Freq: Two times a day (BID) | INTRAVENOUS | Status: DC
  Administered 2016-02-01: 3 mL via INTRAVENOUS

## 2016-02-01 MED ORDER — OXYCODONE-ACETAMINOPHEN 10-325 MG PO TABS: 1.0000 | ORAL_TABLET | ORAL | Status: DC | PRN

## 2016-02-01 MED ORDER — ONDANSETRON HCL 4 MG/2ML IJ SOLN: 4.0000 mg | Freq: Four times a day (QID) | INTRAMUSCULAR | Status: DC | PRN

## 2016-02-01 MED ORDER — ONDANSETRON HCL 4 MG PO TABS: 4.0000 mg | ORAL_TABLET | Freq: Four times a day (QID) | ORAL | Status: DC | PRN

## 2016-02-01 MED ORDER — DEXAMETHASONE SODIUM PHOSPHATE 10 MG/ML IJ SOLN
10.0000 mg | Freq: Once | INTRAMUSCULAR | Status: AC
  Administered 2016-02-01: 10 mg via INTRAVENOUS
  Filled 2016-02-01: qty 1

## 2016-02-01 MED ORDER — FENTANYL 25 MCG/HR TD PT72: 25.0000 ug | MEDICATED_PATCH | TRANSDERMAL | Status: DC

## 2016-02-01 MED ORDER — SENNOSIDES-DOCUSATE SODIUM 8.6-50 MG PO TABS
2.0000 | ORAL_TABLET | Freq: Every day | ORAL | Status: DC
  Administered 2016-02-01 – 2016-02-03 (×3): 2 via ORAL
  Filled 2016-02-01 (×3): qty 2

## 2016-02-01 MED ORDER — CYANOCOBALAMIN 1000 MCG/ML IJ SOLN: 1000.0000 ug | Freq: Once | INTRAMUSCULAR | Status: DC

## 2016-02-01 MED ORDER — SODIUM CHLORIDE 0.9 % IV SOLN
1000.0000 mg | Freq: Two times a day (BID) | INTRAVENOUS | Status: DC
  Administered 2016-02-01 – 2016-02-03 (×4): 1000 mg via INTRAVENOUS
  Filled 2016-02-01 (×5): qty 10

## 2016-02-01 MED ORDER — SODIUM CHLORIDE 0.9% FLUSH: 3.0000 mL | INTRAVENOUS | Status: DC | PRN

## 2016-02-01 MED ORDER — OXYCODONE HCL 5 MG PO TABS
5.0000 mg | ORAL_TABLET | ORAL | Status: DC | PRN
Start: 2016-02-01 — End: 2016-02-04
  Administered 2016-02-02 – 2016-02-03 (×4): 5 mg via ORAL
  Filled 2016-02-01 (×5): qty 1

## 2016-02-01 MED ORDER — ENSURE ENLIVE PO LIQD: 237.0000 mL | Freq: Three times a day (TID) | ORAL | Status: DC

## 2016-02-01 MED ORDER — SODIUM CHLORIDE 0.9% FLUSH
10.0000 mL | INTRAVENOUS | Status: DC | PRN
  Administered 2016-02-04: 10 mL
  Filled 2016-02-01: qty 40

## 2016-02-01 MED ORDER — PROCHLORPERAZINE MALEATE 10 MG PO TABS: 10.0000 mg | ORAL_TABLET | Freq: Four times a day (QID) | ORAL | Status: DC | PRN

## 2016-02-01 MED ORDER — FOLIC ACID 1 MG PO TABS
1.0000 mg | ORAL_TABLET | Freq: Every day | ORAL | Status: DC
  Administered 2016-02-01 – 2016-02-04 (×4): 1 mg via ORAL
  Filled 2016-02-01 (×4): qty 1

## 2016-02-01 MED ORDER — OXYCODONE-ACETAMINOPHEN 5-325 MG PO TABS
1.0000 | ORAL_TABLET | ORAL | Status: DC | PRN
  Administered 2016-02-02 – 2016-02-03 (×4): 1 via ORAL
  Filled 2016-02-01 (×5): qty 1

## 2016-02-01 NOTE — Progress Notes (Signed)
SYMPTOM MANAGEMENT CLINIC    Chief Complaint: Possible new onset seizure, dehydration  HPI:  Andrea [name] 45 y.o. 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  Currently undergoing Tecentriq antibody therapy.    No history exists.    Review of Systems  Constitutional: Positive for malaise/fatigue and weight loss.  Eyes: Positive for blurred vision.  Neurological: Positive for seizures, weakness and headaches.  All other systems reviewed and are negative.   Past Medical History:  Diagnosis Date  . Adrenal mass, left ([place]) 09/21/2015  . Anginal pain ([place])    chest pain not sure what related to  . Anxiety   . Chronic back pain   . Depression   . Dysmenorrhea 12/06/2009   Qualifier: Diagnosis of  By: [name] RN, Regino Schultze    . Encounter for antineoplastic immunotherapy 10/07/2015  . Family history of adverse reaction to anesthesia    mother stopped breathing after surgery  . Headache    stress  . Low back pain radiating to left leg 11/29/2010  . Non-small cell carcinoma of right lung, stage 4 ([place]) 01/01/2015  . S/P radiation therapy 01/01/2015 through 01/21/2015     Left iliac bone 3500 cGy in 14 sessions   . Shortness of breath dyspnea    with exertion, Pt denies 02/2015    Past Surgical History:  Procedure Laterality Date  . laproscopy surgery to check her tubes    . LUMBAR LAMINECTOMY/DECOMPRESSION MICRODISCECTOMY Left 05/01/2014   Procedure: Microdiscectomy - L5-S1 - left;  Surgeon: Eustace Moore, MD;  Location: [facility];  Service: Neurosurgery;  Laterality: Left;    has Dysmenorrhea; Degenerative disc disease, lumbar; Tobacco abuse; Depo-Provera contraceptive status; Depression; Annular disc tear; S/P lumbar laminectomy; Lumbar disc prolapse with compression radiculopathy; Abnormal MRI; Mass of left hip region; Bone metastases ([place]); Non-small cell  carcinoma of right lung, stage 4 ([place]); Malnutrition ([place]); Cancer associated pain; Encounter for antineoplastic chemotherapy; Port catheter in place; Adrenal mass, left ([place]); Encounter for antineoplastic immunotherapy; Chronic fatigue; Encounter for preventive health examination; Unintentional weight loss; Metastasis to brain [facility]); Cerebral edema ([place]); Midline shift of brain; Seizures ([place]); Seizure ([place]); and Dehydration on her problem list.    is allergic to morphine and related and hydrocodone.    Medication List       Accurate as of 02/01/16  6:33 PM. Always use your most recent med list.          cyanocobalamin 1000 MCG/ML injection Commonly known as:  (VITAMIN B-12) Inject 1,000 mcg into the muscle once.   dexamethasone 4 MG tablet Commonly known as:  DECADRON 4 mg po bid, the day before, day of and day after chemo   feeding supplement (ENSURE ENLIVE) Liqd Take 237 mLs by mouth 3 (three) times daily between meals.   fentaNYL 25 MCG/HR patch Commonly known as:  DURAGESIC - dosed mcg/hr Place 1 patch (25 mcg total) onto the skin every 3 (three) days.   folic acid 1 MG tablet Commonly known as:  FOLVITE Take 1 tablet (1 mg total) by mouth daily.   guaiFENesin-dextromethorphan 100-10 MG/5ML syrup Commonly known as:  ROBITUSSIN DM Take 5 mLs by mouth every 4 (four) hours as needed for cough. Reported on 10/25/2015   lidocaine-prilocaine cream Commonly known as:  EMLA Apply 1 teaspoon to skin 1 to 2 hours before use cover to keep in contact with skin   loratadine-pseudoephedrine 10-240 MG 24 hr tablet Commonly known as:  CLARITIN-D 24-hour Take 1 tablet by mouth daily.   MULTIVITAMIN & MINERAL PO Take 2 tablets by mouth daily. Reported on 11/01/2015   oxyCODONE-acetaminophen 10-325 MG tablet Commonly known as:  PERCOCET Take 1 tablet by mouth every 4 (four) hours as needed for pain.   PRENATAL VITAMIN PO Take 1 tablet by mouth daily. Reported on 11/01/2015    prochlorperazine 10 MG tablet Commonly known as:  COMPAZINE Take 1 tablet (10 mg total) by mouth every 6 (six) hours as needed for nausea or vomiting.   senna-docusate 8.6-50 MG tablet Commonly known as:  Senokot-S Take 2 tablets by mouth at bedtime.        PHYSICAL EXAMINATION  Oncology Vitals 02/02/2016 02/01/2016  Height - 168 cm  Weight 71.3 kg 71.2 kg  Weight (lbs) 157 lbs 3 oz 156 lbs 15 oz  BMI (kg/m2) 25.37 kg/m2 25.34 kg/m2  Temp 98.9 98  Pulse 79 93  Resp 20 22  SpO2 100 100  BSA (m2) 1.82 m2 1.82 m2   BP Readings from Last 2 Encounters:  02/02/16 110/77  02/01/16 (!) 142/71    Physical Exam  Constitutional: She is oriented to person, place, and time. Vital signs are normal. She appears dehydrated. She appears unhealthy.  HENT:  Head: Normocephalic and atraumatic.  Mouth/Throat: Oropharynx is clear and moist.  Eyes: Conjunctivae and EOM are normal. Pupils are equal, round, and reactive to light. Right eye exhibits no discharge. Left eye exhibits no discharge. No scleral icterus.  Neck: Normal range of motion. Neck supple. No JVD present. No tracheal deviation present. No thyromegaly present.  Cardiovascular: Normal rate, regular rhythm, normal heart sounds and intact distal pulses.   Pulmonary/Chest: Effort normal and breath sounds normal. No respiratory distress. She has no wheezes. She has no rales. She exhibits no tenderness.  Abdominal: Soft. Bowel sounds are normal. She exhibits no distension and no mass. There is no tenderness. There is no rebound and no guarding.  Musculoskeletal: Normal range of motion. She exhibits no edema or tenderness.  Lymphadenopathy:    She has no cervical adenopathy.  Neurological: She is alert and oriented to person, place, and time.  Walks with the assistance of a cane as baseline.  Skin: Skin is warm and dry. No rash noted. No erythema. No pallor.  Psychiatric: Affect normal.  Nursing note and vitals  reviewed.   LABORATORY DATA:. Appointment on 02/01/2016  Component Date Value Ref Range Status  . WBC 02/01/2016 5.3  3.9 - 10.3 10e3/uL Final  . NEUT# 02/01/2016 3.9  1.5 - 6.5 10e3/uL Final  . HGB 02/01/2016 12.9  11.6 - 15.9 g/dL Final  . HCT 02/01/2016 39.3  34.8 - 46.6 % Final  . Platelets 02/01/2016 521* 145 - 400 10e3/uL Final  . MCV 02/01/2016 88.1  79.5 - 101.0 fL Final  . MCH 02/01/2016 28.9  25.1 - 34.0 pg Final  . MCHC 02/01/2016 32.8  31.5 - 36.0 g/dL Final  . RBC 02/01/2016 4.46  3.70 - 5.45 10e6/uL Final  . RDW 02/01/2016 15.6* 11.2 - 14.5 % Final  . lymph# 02/01/2016 1.0  0.9 - 3.3 10e3/uL Final  . MONO# 02/01/2016 0.4  0.1 - 0.9 10e3/uL Final  . Eosinophils Absolute 02/01/2016 0.0  0.0 - 0.5 10e3/uL Final  . Basophils Absolute 02/01/2016 0.0  0.0 - 0.1 10e3/uL Final  . NEUT% 02/01/2016 73.6  38.4 - 76.8 % Final  . LYMPH% 02/01/2016 18.7  14.0 - 49.7 % Final  . MONO% 02/01/2016  7.0  0.0 - 14.0 % Final  . EOS% 02/01/2016 0.2  0.0 - 7.0 % Final  . BASO% 02/01/2016 0.5  0.0 - 2.0 % Final  . Sodium 02/01/2016 136  136 - 145 mEq/L Final  . Potassium 02/01/2016 4.0  3.5 - 5.1 mEq/L Final  . Chloride 02/01/2016 105  98 - 109 mEq/L Final  . CO2 02/01/2016 20* 22 - 29 mEq/L Final  . Glucose 02/01/2016 124  70 - 140 mg/dl Final  . BUN 02/01/2016 4.7* 7.0 - 26.0 mg/dL Final  . Creatinine 02/01/2016 0.7  0.6 - 1.1 mg/dL Final  . Total Bilirubin 02/01/2016 0.40  0.20 - 1.20 mg/dL Final  . Alkaline Phosphatase 02/01/2016 198* 40 - 150 U/L Final  . AST 02/01/2016 17  5 - 34 U/L Final  . ALT 02/01/2016 16  0 - 55 U/L Final  . Total Protein 02/01/2016 8.6* 6.4 - 8.3 g/dL Final  . Albumin 02/01/2016 3.1* 3.5 - 5.0 g/dL Final  . Calcium 02/01/2016 8.8  8.4 - 10.4 mg/dL Final  . Anion Gap 02/01/2016 11  3 - 11 mEq/L Final  . EGFR 02/01/2016 >90  >90 ml/min/1.73 m2 Final    RADIOGRAPHIC STUDIES: Mr Jeri Cos Wo Contrast  Addendum Date: 02/01/2016   ADDENDUM REPORT: 02/01/2016  18:03 ADDENDUM: Study discussed by telephone with Dr. Burr Medico (on call for provider CYNTHIA BACON ) on 02/01/2016 at 18:02 . Electronically Signed   By: Genevie Ann M.D.   On: 02/01/2016 18:03   Result Date: 02/01/2016 CLINICAL DATA:  44 year old female with stage IV right lung non-small cell carcinoma. New onset seizure. Subsequent encounter. EXAM: MRI HEAD WITHOUT AND WITH CONTRAST TECHNIQUE: Multiplanar, multiecho pulse sequences of the brain and surrounding structures were obtained without and with intravenous contrast. CONTRAST:  65m MULTIHANCE GADOBENATE DIMEGLUMINE 529 MG/ML IV SOLN COMPARISON:  Brain MRI 12/29/2014. FINDINGS: Brain: At least nine enhancing brain metastases. The largest is a 3 cm lobulated mass in the anterior left frontal lobe white matter with a large area of surrounding vasogenic edema (series 13, image 34 and series 15, image 17). There are associated blood products within this lesion, but no overt intracranial hemorrhage. There is associated left anterior intracranial mass effect with anterior rightward midline shift up to 10 mm. Partial effacement of the left lateral ventricle. Downward mass effect resulting in partial effacement of the suprasellar cistern. The smallest brain metastases are punctate. Two 1.4 - 1.5 cm metastases in the right parietal lobe and right posterior operculum are also associated with moderate vasogenic edema, but only mild regional mass effect. There are 3 small left cerebellar metastases. No dural thickening. No leptomeningeal enhancement. No restricted diffusion or evidence of acute infarction. No extra-axial collection. Negative pituitary. Negative cervicomedullary junction. Vascular: Major intracranial vascular flow voids are preserved. Skull and upper cervical spine: Negative visualized cervical spine and spinal cord. Heterogeneous bone marrow signal but no destructive osseous lesion identified. Sinuses/Orbits: Visualized paranasal sinuses and mastoids are  stable and well pneumatized. Negative orbit soft tissues. Other: Visible internal auditory structures appear normal. Negative scalp soft tissues. IMPRESSION: Positive for at least 9 brain metastases. The largest is 3 cm in the anterior left frontal lobe with a large area of vasogenic edema and mass effect including rightward midline shift of 10 mm and partial effacement of the left lateral ventricle and suprasellar cistern. Electronically Signed: By: HGenevie AnnM.D. On: 02/01/2016 17:40    ASSESSMENT/PLAN:    Seizures ([facility] Patient presented to the  cancer Center today with complaint of chronic headaches to the frontal and temporal region for the last few days.  She's also noted some vision changes as well.  She states that she is having episodes of dizziness as well; even without position changes.  Patient's mother reports that patient had what was an apparent seizure approximately 2 days ago.  Patient was on the couch in the presence of her mother-and began "to stare off into space; and then began shaking with her entire body.  She then fell to the floor off the couch".  Patient then appeared to awaken; and was able to respond to questions and commands.  When asked about this episode-patient has no memory of this episode and remembers waking up on the floor.  She's had no other episodes of this type since that time.  Patient's mother also reports the patient has had several different episodes didn't last 2 weeks of "staring off in to space"; and "not acting herself".   Neurological exam today reveals neurologically intact with no weakness or deficits.  Patient continues to walk with the assistance of a cane secondary to known left iliac metastasis.  Reviewed all findings with Dr. Julien Nordmann; he recommended that patient undergo a brain MRI this afternoon for further evaluation.  Patient will have a brain MRI at 4 PM today; and will remain in the [facility] receiving IV fluid rehydration until that  time.  Both patient and her mother were instructed that the scan results will be reviewed with her in detail.  Once these results are obtained this afternoon.  Advised both patient and her mother that-dependent on the results-patient may be required to go directly to the emergency department for further evaluation and management; may be admitted to the hospital; or will be followed up here at the cancer center.  Both patient and her mother were comfortable with this plan.  Non-small cell carcinoma of right lung, stage 4 ([place]) Patient is currently undergoing Tecentriq antibody therapy.  She received cycle 6 of his treatment on 01/24/2016.  She is scheduled to return for labs, flush, visit, and her next cycle of antibody therapy on 02/14/2016.   Dehydration Patient does have decreased appetite and very poor oral intake recently.  She denies any nausea, vomiting, or diarrhea.  She feels dehydrated today; will receive 1 L normal saline IV fluid rehydration while cancer Center today.  Following the administration of her IV fluids-patient stated that she felt much better.  She was also able to eat a snack with no difficulty.   Patient stated understanding of all instructions; and was in agreement with this plan of care. The patient knows to call the clinic with any problems, questions or concerns.   Total time spent with patient was 40 minutes;  with greater than 75 percent of that time spent in face to face counseling regarding patient's symptoms,  and coordination of care and follow up.  Disclaimer:This dictation was prepared with Dragon/digital dictation along with [employer]. Any transcriptional errors that result from this process are unintentional.  Drue Second, NP 02/02/2016  Transported to Radiology for Brain MRI via w/c and NT

## 2016-02-01 NOTE — H&P (Signed)
History and Physical    Andrea [name] IEP:[id number] DOB: [birth year] DOA: 02/01/2016  PCP: [name], MD  Patient coming from: home, MRI department  Chief Complaint:  Seizure   HPI: Andrea [name] is a 45 y.o. 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  Pt says for the last week she gets these episodes where she falls to the ground.  Her mother witnessed one and there was jerking.  An emergent MRI brain was done which found several new lesions in the brain one in the frontal lobe with associated edema and midline shift to the right.  Dr Luis Abed with oncology called to arrange direct admit from the MRI department.  Pt says she has been having worsening headaches for over a week.  Denies any fevers.  She says she feels fine right now and has no complaints.  The last "episode" she had was 2 days ago.  No previous seizures.  Review of Systems: As per HPI otherwise 10 point review of systems negative.   Past Medical History:  Diagnosis Date  . 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 Anginal pain ([place])    chest pain not sure what related to  . Anxiety   . Chronic back pain   . Depression   . Dysmenorrhea 12/06/2009   Qualifier: Diagnosis of  By: [name] RN, Regino Schultze    . Encounter for antineoplastic immunotherapy 10/07/2015  . Family history of adverse reaction to anesthesia    mother stopped breathing after surgery  . Headache    stress  . Low back pain radiating to left leg 11/29/2010  . Non-small cell carcinoma of right lung, stage 4 ([place]) 01/01/2015  . S/P radiation therapy 01/01/2015 through 01/21/2015     Left iliac bone 3500 cGy in 14 sessions   . Shortness of breath dyspnea    with exertion, Pt denies 02/2015    Past Surgical History:  Procedure Laterality Date  . laproscopy  surgery to check her tubes    . LUMBAR LAMINECTOMY/DECOMPRESSION MICRODISCECTOMY Left 05/01/2014   Procedure: Microdiscectomy - L5-S1 - left;  Surgeon: Eustace Moore, MD;  Location: [facility];  Service: Neurosurgery;  Laterality: Left;     reports that she quit smoking about 13 months ago. Her smoking use included Cigarettes and E-cigarettes. She has a 12.50 pack-year smoking history. She has never used smokeless tobacco. She reports that she does not drink alcohol or use drugs.  Allergies  Allergen Reactions  . Morphine And Related Rash    Patient states that if she takes more than 30 days she gets rashes.  . Hydrocodone Rash    Family History  Problem Relation Age of Onset  . Diabetes Mother   . Hypertension Mother   . Hypertension Father   . Heart attack Maternal Grandfather   . Hypertension Maternal Grandfather   . Heart attack Paternal Grandfather   . Hypertension Paternal Grandfather     Prior to Admission medications   Medication Sig Start Date End Date Taking? Authorizing Provider  cyanocobalamin (,VITAMIN B-12,) 1000 MCG/ML injection Inject 1,000 mcg into the muscle once.    Historical Provider, MD  dexamethasone (DECADRON) 4 MG tablet 4 mg po bid, the day before, day of and day after chemo 03/01/15   Curt Bears, MD  feeding supplement, ENSURE ENLIVE, (ENSURE ENLIVE) LIQD Take 237 mLs by mouth 3 (  three) times daily between meals. 01/04/15   Debbe Odea, MD  fentaNYL (DURAGESIC - DOSED MCG/HR) 25 MCG/HR patch Place 1 patch (25 mcg total) onto the skin every 3 (three) days. 01/13/16   Maryanna Shape, NP  folic acid (FOLVITE) 1 MG tablet Take 1 tablet (1 mg total) by mouth daily. 02/08/15   Curt Bears, MD  guaiFENesin-dextromethorphan [facility] DM) 100-10 MG/5ML syrup Take 5 mLs by mouth every 4 (four) hours as needed for cough. Reported on 10/25/2015    Historical Provider, MD  lidocaine-prilocaine (EMLA) cream Apply 1 teaspoon to skin 1 to 2 hours before use cover to keep  in contact with skin 03/08/15   Curt Bears, MD  loratadine-pseudoephedrine (CLARITIN-D 24-HOUR) 10-240 MG 24 hr tablet Take 1 tablet by mouth daily.    Historical Provider, MD  Multiple Vitamins-Minerals (MULTIVITAMIN & MINERAL PO) Take 2 tablets by mouth daily. Reported on 11/01/2015    Historical Provider, MD  oxyCODONE-acetaminophen (PERCOCET) 10-325 MG tablet Take 1 tablet by mouth every 4 (four) hours as needed for pain. 01/24/16   Curt Bears, MD  Prenatal Vit-Fe Fumarate-FA (PRENATAL VITAMIN PO) Take 1 tablet by mouth daily. Reported on 11/01/2015    Historical Provider, MD  prochlorperazine (COMPAZINE) 10 MG tablet Take 1 tablet (10 mg total) by mouth every 6 (six) hours as needed for nausea or vomiting. Patient not taking: Reported on 01/24/2016 07/12/15   Curt Bears, MD  senna-docusate (SENOKOT-S) 8.6-50 MG per tablet Take 2 tablets by mouth at bedtime. 01/04/15   Debbe Odea, MD    Physical Exam: Vitals:   02/01/16 1918  BP: 104/61  Pulse: 93  Resp: (!) 22  Temp: 98 F (36.7 C)  TempSrc: Oral  SpO2: 100%  Weight: 71.2 kg (156 lb 15.5 oz)  Height: '5\' 6"'$  (1.676 m)    Constitutional: NAD, calm, comfortable Vitals:   02/01/16 1918  BP: 104/61  Pulse: 93  Resp: (!) 22  Temp: 98 F (36.7 C)  TempSrc: Oral  SpO2: 100%  Weight: 71.2 kg (156 lb 15.5 oz)  Height: '5\' 6"'$  (1.676 m)   Eyes: PERRL, lids and conjunctivae normal ENMT: Mucous membranes are moist. Posterior pharynx clear of any exudate or lesions.Normal dentition.  Neck: normal, supple, no masses, no thyromegaly Respiratory: clear to auscultation bilaterally, no wheezing, no crackles. Normal respiratory effort. No accessory muscle use.  Cardiovascular: Regular rate and rhythm, no murmurs / rubs / gallops. No extremity edema. 2+ pedal pulses. No carotid bruits.  Abdomen: no tenderness, no masses palpated. No hepatosplenomegaly. Bowel sounds positive.  Musculoskeletal: no clubbing / cyanosis. No joint  deformity upper and lower extremities. Good ROM, no contractures. Normal muscle tone.  Skin: no rashes, lesions, ulcers. No induration Neurologic: CN 2-12 grossly intact. Sensation intact, DTR normal. Strength 5/5 in all 4.  Psychiatric: Normal judgment and insight. Alert and oriented x 3. Normal mood.    Labs on Admission: I have personally reviewed following labs and imaging studies  CBC:  Recent Labs Lab 02/01/16 1100  WBC 5.3  NEUTROABS 3.9  HGB 12.9  HCT 39.3  MCV 88.1  PLT 937*   Basic Metabolic Panel:  Recent Labs Lab 02/01/16 1100  NA 136  K 4.0  CO2 20*  GLUCOSE 124  BUN 4.7*  CREATININE 0.7  CALCIUM 8.8   GFR: Estimated Creatinine Clearance: 90.8 mL/min (by C-G formula based on SCr of 0.7 mg/dL). Liver Function Tests:  Recent Labs Lab 02/01/16 1100  AST 17  ALT 16  ALKPHOS 198*  BILITOT 0.40  PROT 8.6*  ALBUMIN 3.1*    Radiological Exams on Admission: Mr Jeri Cos Wo Contrast  Addendum Date: 02/01/2016   ADDENDUM REPORT: 02/01/2016 18:03 ADDENDUM: Study discussed by telephone with Dr. Burr Medico (on call for provider CYNTHIA BACON ) on 02/01/2016 at 18:02 . Electronically Signed   By: Genevie Ann M.D.   On: 02/01/2016 18:03   Result Date: 02/01/2016 CLINICAL DATA:  45 year old female with stage IV right lung non-small cell carcinoma. New onset seizure. Subsequent encounter. EXAM: MRI HEAD WITHOUT AND WITH CONTRAST TECHNIQUE: Multiplanar, multiecho pulse sequences of the brain and surrounding structures were obtained without and with intravenous contrast. CONTRAST:  49m MULTIHANCE GADOBENATE DIMEGLUMINE 529 MG/ML IV SOLN COMPARISON:  Brain MRI 12/29/2014. FINDINGS: Brain: At least nine enhancing brain metastases. The largest is a 3 cm lobulated mass in the anterior left frontal lobe white matter with a large area of surrounding vasogenic edema (series 13, image 34 and series 15, image 17). There are associated blood products within this lesion, but no overt  intracranial hemorrhage. There is associated left anterior intracranial mass effect with anterior rightward midline shift up to 10 mm. Partial effacement of the left lateral ventricle. Downward mass effect resulting in partial effacement of the suprasellar cistern. The smallest brain metastases are punctate. Two 1.4 - 1.5 cm metastases in the right parietal lobe and right posterior operculum are also associated with moderate vasogenic edema, but only mild regional mass effect. There are 3 small left cerebellar metastases. No dural thickening. No leptomeningeal enhancement. No restricted diffusion or evidence of acute infarction. No extra-axial collection. Negative pituitary. Negative cervicomedullary junction. Vascular: Major intracranial vascular flow voids are preserved. Skull and upper cervical spine: Negative visualized cervical spine and spinal cord. Heterogeneous bone marrow signal but no destructive osseous lesion identified. Sinuses/Orbits: Visualized paranasal sinuses and mastoids are stable and well pneumatized. Negative orbit soft tissues. Other: Visible internal auditory structures appear normal. Negative scalp soft tissues. IMPRESSION: Positive for at least 9 brain metastases. The largest is 3 cm in the anterior left frontal lobe with a large area of vasogenic edema and mass effect including rightward midline shift of 10 mm and partial effacement of the left lateral ventricle and suprasellar cistern. Electronically Signed: By: HGenevie AnnM.D. On: 02/01/2016 17:40   Old chart reviewed Mri reviewed Case discussed with dr fLuis Abedfrom oncology department  Assessment/Plan 45yo female with known non small cell cancer of lung stage 4 with new disease in brain with associated edema/mass effect /[place] Principal Problem:   [place]iz[place] ([place]  Will load with keppra 1000 mg iv q 12 hours.  Place on seizure precautions.  Dr fLuis Abedto see in the am and she will arrange rad onc to see in am for consideration of  radiation to the brain  Active Problems:   Non-small cell carcinoma of right lung, stage 4 (HCC)   Metastasis to brain (HCC)   Cerebral edema (HCC)   Midline shift of brain   Chronic fatigue     DVT prophylaxis:  scds Code Status:   full Family Communication:  Mother at bedside Disposition Plan  Per day team Consults called:   Oncology discussed personally with dr fLuis AbedAdmission status:   admit   Kourtland Coopman A MD [facility]  If 7PM-7AM, please contact night-coverage [email or web address] Password [email or web address]  02/01/2016, 7:47 PM

## 2016-02-01 NOTE — Telephone Encounter (Signed)
I spoke with the radiologist Dr. Nevada Crane regarding her brain MRI findings, which showed multiple brain metastasis, significant surrounding edema, and mild midline shift. Patient lives alone, had a seizure earlier today, I recommend her to be admitted to [facility], for observation, and started dexamethasone 10 mg once followed by 4 mg every 6 hours, and Keppra. I spoke hospitalist Dr. Shanon Brow and she kindly agreed with admission. I will informed her primary oncology oncologist Dr. Joeseph Amor, and a radiation oncologist, to see her tomorrow. Pt is in [facility] MRI radiology, I spoke with MRI staff about her admission.   Truitt Merle  02/01/2016

## 2016-02-01 NOTE — Patient Instructions (Signed)

## 2016-02-02 ENCOUNTER — Ambulatory Visit
Admit: 2016-02-02 | Discharge: 2016-02-02 | Disposition: A | Discharge status: Home / Self Care | Payer: Medicaid Other | Attending: Radiation Oncology | Admitting: Radiation Oncology

## 2016-02-02 ENCOUNTER — Encounter: Payer: Self-pay | Admitting: *Deleted

## 2016-02-02 ENCOUNTER — Encounter: Payer: Self-pay | Admitting: Nurse Practitioner

## 2016-02-02 DIAGNOSIS — R569 Unspecified convulsions: Secondary | ICD-10-CM

## 2016-02-02 DIAGNOSIS — G936 Cerebral edema: Secondary | ICD-10-CM

## 2016-02-02 DIAGNOSIS — E860 Dehydration: Secondary | ICD-10-CM | POA: Insufficient documentation

## 2016-02-02 DIAGNOSIS — C7931 Secondary malignant neoplasm of brain: Secondary | ICD-10-CM

## 2016-02-02 DIAGNOSIS — C3400 Malignant neoplasm of unspecified main bronchus: Secondary | ICD-10-CM

## 2016-02-02 DIAGNOSIS — R5382 Chronic fatigue, unspecified: Secondary | ICD-10-CM

## 2016-02-02 DIAGNOSIS — G939 Disorder of brain, unspecified: Secondary | ICD-10-CM

## 2016-02-02 MED ORDER — PREMIER PROTEIN SHAKE
11.0000 [oz_av] | Freq: Two times a day (BID) | ORAL | Status: DC
  Administered 2016-02-02 – 2016-02-04 (×3): 11 [oz_av] via ORAL
  Filled 2016-02-02 (×2): qty 325.31

## 2016-02-02 MED ORDER — FENTANYL 25 MCG/HR TD PT72
25.0000 ug | MEDICATED_PATCH | TRANSDERMAL | Status: DC
  Administered 2016-02-02: 25 ug via TRANSDERMAL
  Filled 2016-02-02: qty 1

## 2016-02-02 MED ORDER — ENSURE ENLIVE PO LIQD: 237.0000 mL | Freq: Two times a day (BID) | ORAL | Status: DC

## 2016-02-02 NOTE — Consult Note (Signed)
Radiation Oncology         [phone] ________________________________  Initial inpatient Consultation  Name: Andrea [name] MRN: [id number]  Date: 02/01/2016  DOB: 01/25/1971  CC:Andrea Nims, MD  Susanne Borders, NP   REFERRING PHYSICIAN: Susanne Borders, NP  DIAGNOSIS: The encounter diagnosis was Non-small cell carcinoma of right lung, stage 4 ([place]).    ICD-9-CM ICD-10-CM   1. Non-small cell carcinoma of right lung, stage 4 (HCC) 162.9 C34.91 MR Brain W Wo Contrast     MR Brain W Wo Contrast    HISTORY OF PRESENT ILLNESS: Andrea [name] is a 45 y.o. female seen at the request of Dr.Feng and  Mohamed with a history of Stage IV (T1a, N2, M1b) non-small cell lung cancer, adenocarcinoma of the right lung and involving the left posterior iliac bone at presentation. The patient received palliative radiotherapy up front to the left iliac region, and went on to receive systemic therapy with taxol/carboplatin, then alimta for maintenance. She developed disease progression on this regimen and was changed to immunotherapy with Tecentriq in June 2017. She has completed 5 cycles, and was seen on 01/24/16 with the intention of continuation of immunotherapy. She developed seizure like activity on three occasions and was seen in symptom management. A stat brain MRI on 02/01/16 revealed a 3 cm lobulated mass in the anterior left frontal lobe with a large area of surrounding vasogenic edema. There is also rightward midline shift up to 10 mm. The smallest brain metastases are punctate, and two 1.4-1.5 cm metastases in the right parietal lobe and right posterior operculum, and three small left cerebellar metastases.   PREVIOUS RADIATION THERAPY: Yes   01/01/2015 through 01/21/2015:Left iliac bone 35 Gy in 14 sessions     PAST MEDICAL HISTORY:  Past Medical History:  Diagnosis Date  . 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 Anginal pain ([place])    chest pain not sure what related to  . Anxiety   .  Chronic back pain   . Depression   . Dysmenorrhea 12/06/2009   Qualifier: Diagnosis of  By: [name] RN, Regino Schultze    . Encounter for antineoplastic immunotherapy 10/07/2015  . Family history of adverse reaction to anesthesia    mother stopped breathing after surgery  . Headache    stress  . Low back pain radiating to left leg 11/29/2010  . Non-small cell carcinoma of right lung, stage 4 ([place]) 01/01/2015  . S/P radiation therapy 01/01/2015 through 01/21/2015     Left iliac bone 3500 cGy in 14 sessions   . Shortness of breath dyspnea    with exertion, Pt denies 02/2015      PAST SURGICAL HISTORY: Past Surgical History:  Procedure Laterality Date  . laproscopy surgery to check her tubes    . LUMBAR LAMINECTOMY/DECOMPRESSION MICRODISCECTOMY Left 05/01/2014   Procedure: Microdiscectomy - L5-S1 - left;  Surgeon: Eustace Moore, MD;  Location: [facility];  Service: Neurosurgery;  Laterality: Left;    FAMILY HISTORY:  Family History  Problem Relation Age of Onset  . Diabetes Mother   . Hypertension Mother   . Hypertension Father   . Heart attack Maternal Grandfather   . Hypertension Maternal Grandfather   . Heart attack Paternal Grandfather   . Hypertension Paternal Grandfather     SOCIAL HISTORY:  Social History   Social History  . Marital status: Single    Spouse name: N/A  . Number of children: 0  . Years of education: N/A  Occupational History  . Not on file.   Social History Main Topics  . Smoking status: Former Smoker    Packs/day: 0.50    Years: 25.00    Types: Cigarettes, E-cigarettes    Quit date: 12/29/2014  . Smokeless tobacco: Never Used     Comment: Smoking very little.  . Alcohol use No  . Drug use: No     Comment: previoulsly.  [name] Sexual activity: Not Currently   Other Topics Concern  . Not on file   Social History Narrative  . No narrative on file  The patient is single and lives in  [place]. Her mother lives nearby as well.   ALLERGIES: Morphine and related and Hydrocodone  MEDICATIONS:  Current Facility-Administered Medications  Medication Dose Route Frequency Provider Last Rate Last Dose  . dexamethasone (DECADRON) injection 4 mg  4 mg Intravenous Q6H Phillips Grout, MD   4 mg at 02/02/16 1140  . [START ON 02/04/2016] fentaNYL (DURAGESIC - dosed mcg/hr) patch 25 mcg  25 mcg Transdermal Q72H Phillips Grout, MD      . folic acid (FOLVITE) tablet 1 mg  1 mg Oral Daily Phillips Grout, MD   1 mg at 02/02/16 0849  . levETIRAcetam (KEPPRA) 1,000 mg in sodium chloride 0.9 % 100 mL IVPB  1,000 mg Intravenous Q12H Phillips Grout, MD   1,000 mg at 02/02/16 0848  . loratadine (CLARITIN) tablet 10 mg  10 mg Oral Daily Phillips Grout, MD   10 mg at 02/02/16 0849  . ondansetron (ZOFRAN) tablet 4 mg  4 mg Oral Q6H PRN Phillips Grout, MD       Or  . ondansetron (ZOFRAN) injection 4 mg  4 mg Intravenous Q6H PRN Phillips Grout, MD      . oxyCODONE-acetaminophen (PERCOCET/ROXICET) 5-325 MG per tablet 1 tablet  1 tablet Oral Q4H PRN Phillips Grout, MD       And  . oxyCODONE (Oxy IR/ROXICODONE) immediate release tablet 5 mg  5 mg Oral Q4H PRN Phillips Grout, MD      . prochlorperazine (COMPAZINE) tablet 10 mg  10 mg Oral Q6H PRN Phillips Grout, MD      . protein supplement (PREMIER PROTEIN) liquid  11 oz Oral BID BM Rosezetta Schlatter, RD      . senna-docusate (Senokot-S) tablet 2 tablet  2 tablet Oral QHS Phillips Grout, MD   2 tablet at 02/01/16 2220  . sodium chloride flush (NS) 0.9 % injection 10-40 mL  10-40 mL Intracatheter Q12H Rachal Rayvon Char, MD      . sodium chloride flush (NS) 0.9 % injection 10-40 mL  10-40 mL Intracatheter PRN Phillips Grout, MD        REVIEW OF SYSTEMS:  On review of systems, the patient reports that she feels a bit better now but has noticed several episodes of nausea for a few days, and headaches that she felt that progressed into seizure activity only on  three occasions in the last week. She denies any chest pain, shortness of breath, cough, fevers, chills, night sweats, unintended weight changes. She denies any bowel or bladder disturbances, and denies abdominal pain, nausea or vomiting. She denies any new musculoskeletal or joint aches or pains. A complete review of systems is obtained and is otherwise negative.    PHYSICAL EXAM:  height is 5' 6"  (1.676 m) and weight is 157 lb 3 oz (71.3 kg). Her oral temperature is  98.9 F (37.2 C). Her blood pressure is 110/77 and her pulse is 79. Her respiration is 20 and oxygen saturation is 100%.   Pain Scale 0/10 In general this is a well appearing African American female in no acute distress. She's alert and oriented x4 and appropriate throughout the examination and grossly appears neurologically intact. Cardiopulmonary assessment is negative for acute distress and she exhibits normal effort.    KPS = 50  100 - Normal; no complaints; no evidence of disease. 90   - Able to carry on normal activity; minor signs or symptoms of disease. 80   - Normal activity with effort; some signs or symptoms of disease. 62   - Cares for self; unable to carry on normal activity or to do active work. 60   - Requires occasional assistance, but is able to care for most of his personal needs. 50   - Requires considerable assistance and frequent medical care. 48   - Disabled; requires special care and assistance. 25   - Severely disabled; hospital admission is indicated although death not imminent. 46   - Very sick; hospital admission necessary; active supportive treatment necessary. 10   - Moribund; fatal processes progressing rapidly. 0     - Dead  Karnofsky DA, Abelmann Sumner, Craver LS and Burchenal [facility] [phone]) The use of the nitrogen mustards in the palliative treatment of carcinoma: with particular reference to bronchogenic carcinoma Cancer 1 634-56  LABORATORY DATA:  Lab Results  Component Value Date   WBC 5.3  02/01/2016   HGB 12.9 02/01/2016   HCT 39.3 02/01/2016   MCV 88.1 02/01/2016   PLT 521 (H) 02/01/2016   Lab Results  Component Value Date   NA 136 02/01/2016   K 4.0 02/01/2016   CL 111 03/08/2015   CO2 20 (L) 02/01/2016   Lab Results  Component Value Date   ALT 16 02/01/2016   AST 17 02/01/2016   ALKPHOS 198 (H) 02/01/2016   BILITOT 0.40 02/01/2016     RADIOGRAPHY: Mr Jeri Cos JX Contrast  Addendum Date: 02/01/2016   ADDENDUM REPORT: 02/01/2016 18:03 ADDENDUM: Study discussed by telephone with Dr. Burr Medico (on call for provider CYNTHIA BACON ) on 02/01/2016 at 18:02 . Electronically Signed   By: Genevie Ann M.D.   On: 02/01/2016 18:03   Result Date: 02/01/2016 CLINICAL DATA:  45 year old female with stage IV right lung non-small cell carcinoma. New onset seizure. Subsequent encounter. EXAM: MRI HEAD WITHOUT AND WITH CONTRAST TECHNIQUE: Multiplanar, multiecho pulse sequences of the brain and surrounding structures were obtained without and with intravenous contrast. CONTRAST:  37m MULTIHANCE GADOBENATE DIMEGLUMINE 529 MG/ML IV SOLN COMPARISON:  Brain MRI 12/29/2014. FINDINGS: Brain: At least nine enhancing brain metastases. The largest is a 3 cm lobulated mass in the anterior left frontal lobe white matter with a large area of surrounding vasogenic edema (series 13, image 34 and series 15, image 17). There are associated blood products within this lesion, but no overt intracranial hemorrhage. There is associated left anterior intracranial mass effect with anterior rightward midline shift up to 10 mm. Partial effacement of the left lateral ventricle. Downward mass effect resulting in partial effacement of the suprasellar cistern. The smallest brain metastases are punctate. Two 1.4 - 1.5 cm metastases in the right parietal lobe and right posterior operculum are also associated with moderate vasogenic edema, but only mild regional mass effect. There are 3 small left cerebellar metastases. No dural  thickening. No leptomeningeal enhancement. No restricted diffusion or  evidence of acute infarction. No extra-axial collection. Negative pituitary. Negative cervicomedullary junction. Vascular: Major intracranial vascular flow voids are preserved. Skull and upper cervical spine: Negative visualized cervical spine and spinal cord. Heterogeneous bone marrow signal but no destructive osseous lesion identified. Sinuses/Orbits: Visualized paranasal sinuses and mastoids are stable and well pneumatized. Negative orbit soft tissues. Other: Visible internal auditory structures appear normal. Negative scalp soft tissues. IMPRESSION: Positive for at least 9 brain metastases. The largest is 3 cm in the anterior left frontal lobe with a large area of vasogenic edema and mass effect including rightward midline shift of 10 mm and partial effacement of the left lateral ventricle and suprasellar cistern. Electronically Signed: By: Genevie Ann M.D. On: 02/01/2016 17:40      IMPRESSION/PLAN: 1. Progressive Stage IV (T1a, N2, M1b) non-small cell lung cancer, adenocarcinoma of the right lung with multiple brain mets. I met with the patient and reviewed the MRI findings with her. Dr. Tammi Klippel has reviewed her imaging and feels that she would be a good candidate for whole brain radiotherapy due to the size and number of lesions rather than SRS. We discussed the risks, benefits, short, and long term effects of radiotherapy. We discussed the logistics and will schedule her for simulation in our department tomorrow. She will meet with Dr. Tammi Klippel and sign consent at that time.     Carola Rhine, PAC

## 2016-02-02 NOTE — Progress Notes (Signed)
PROGRESS NOTE    Andrea [name]  KXF:[id number]  DOB: October 02, 1970  DOA: 02/01/2016 PCP: [name], MD Outpatient Specialists:   Hospital course: Andrea [name] is a 45 y.o. 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  Pt says for the last week she gets these episodes where she falls to the ground.  Her mother witnessed one and there was jerking.  An emergent MRI brain was done which found several new lesions in the brain one in the frontal lobe with associated edema and midline shift to the right.  Dr Luis Abed with oncology called to arrange direct admit from the MRI department for decadron therapy and radiation treatments.    Assessment & Plan:   1. 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  2. 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    DVT prophylaxis:  scds Code Status:   full Family Communication:  Mother at bedside Disposition Plan  Per day team Consults called:   Oncology discussed personally with dr Luis Abed Admission status:   admit  Consultants:  Radiation oncology    Subjective: Pt without complaints, no further seizure activity  Objective: Vitals:   02/01/16 1918 02/02/16 0523  BP: 104/61 110/77  Pulse: 93 79  Resp: (!) 22 20  Temp: 98 F (36.7 C) 98.9 F (37.2 C)  TempSrc: Oral Oral  SpO2: 100% 100%  Weight: 71.2 kg (156 lb 15.5 oz) 71.3 kg (157 lb 3 oz)  Height: '5\' 6"'$  (1.676 m)     Intake/Output Summary (Last 24 hours) at 02/02/16 0808 Last data filed at 02/01/16 2300  Gross per 24 hour  Intake              110 ml  Output                0 ml  Net              110 ml   Filed Weights   02/01/16 1918 02/02/16 0523  Weight: 71.2 kg (156 lb 15.5  oz) 71.3 kg (157 lb 3 oz)    Exam:  Eyes: PERRL, lids and conjunctivae normal ENMT: Mucous membranes are moist. Posterior pharynx clear of any exudate or lesions.Normal dentition.  Neck: normal, supple, no masses, no thyromegaly Respiratory: clear to auscultation bilaterally, no wheezing, no crackles. Normal respiratory effort. No accessory muscle use.  Cardiovascular: Regular rate and rhythm, no murmurs / rubs / gallops. No extremity edema. 2+ pedal pulses. No carotid bruits.  Abdomen: no tenderness, no masses palpated. No hepatosplenomegaly. Bowel sounds positive.  Musculoskeletal: no clubbing / cyanosis. No joint deformity upper and lower extremities. Good ROM, no contractures. Normal muscle tone.  Skin: no rashes, lesions, ulcers. No induration Neurologic: CN 2-12 grossly intact. Sensation intact, DTR normal. Strength 5/5 in all 4.  Psychiatric: Normal judgment and insight. Alert and oriented x 3. Normal mood.   Data Reviewed: Basic Metabolic Panel:  Recent Labs Lab 02/01/16 1100  NA 136  K 4.0  CO2 20*  GLUCOSE 124  BUN 4.7*  CREATININE 0.7  CALCIUM 8.8   Liver Function Tests:  Recent Labs Lab 02/01/16 1100  AST 17  ALT 16  ALKPHOS 198*  BILITOT 0.40  PROT 8.6*  ALBUMIN 3.1*   No results for input(s): LIPASE, AMYLASE in the last 168 hours. No results for input(s): AMMONIA in the last 168 hours. CBC:  Recent Labs Lab 02/01/16 1100  WBC 5.3  NEUTROABS 3.9  HGB 12.9  HCT 39.3  MCV 88.1  PLT 521*   Cardiac Enzymes: No results for input(s): CKTOTAL, CKMB, CKMBINDEX, TROPONINI in the last 168 hours. CBG (last 3)  No results for input(s): GLUCAP in the last 72 hours. No results found for this or any previous visit (from the past 240 hour(s)).   Studies: Mr Jeri Cos BT Contrast  Addendum Date: 02/01/2016   ADDENDUM REPORT: 02/01/2016 18:03 ADDENDUM: Study discussed by telephone with Dr. Burr Medico (on call for provider Drue Second ) on 02/01/2016 at 18:02 .  Electronically Signed   By: Genevie Ann M.D.   On: 02/01/2016 18:03   Result Date: 02/01/2016 CLINICAL DATA:  45 year old female with stage IV right lung non-small cell carcinoma. New onset seizure. Subsequent encounter. EXAM: MRI HEAD WITHOUT AND WITH CONTRAST TECHNIQUE: Multiplanar, multiecho pulse sequences of the brain and surrounding structures were obtained without and with intravenous contrast. CONTRAST:  73m MULTIHANCE GADOBENATE DIMEGLUMINE 529 MG/ML IV SOLN COMPARISON:  Brain MRI 12/29/2014. FINDINGS: Brain: At least nine enhancing brain metastases. The largest is a 3 cm lobulated mass in the anterior left frontal lobe white matter with a large area of surrounding vasogenic edema (series 13, image 34 and series 15, image 17). There are associated blood products within this lesion, but no overt intracranial hemorrhage. There is associated left anterior intracranial mass effect with anterior rightward midline shift up to 10 mm. Partial effacement of the left lateral ventricle. Downward mass effect resulting in partial effacement of the suprasellar cistern. The smallest brain metastases are punctate. Two 1.4 - 1.5 cm metastases in the right parietal lobe and right posterior operculum are also associated with moderate vasogenic edema, but only mild regional mass effect. There are 3 small left cerebellar metastases. No dural thickening. No leptomeningeal enhancement. No restricted diffusion or evidence of acute infarction. No extra-axial collection. Negative pituitary. Negative cervicomedullary junction. Vascular: Major intracranial vascular flow voids are preserved. Skull and upper cervical spine: Negative visualized cervical spine and spinal cord. Heterogeneous bone marrow signal but no destructive osseous lesion identified. Sinuses/Orbits: Visualized paranasal sinuses and mastoids are stable and well pneumatized. Negative orbit soft tissues. Other: Visible internal auditory structures appear normal. Negative  scalp soft tissues. IMPRESSION: Positive for at least 9 brain metastases. The largest is 3 cm in the anterior left frontal lobe with a large area of vasogenic edema and mass effect including rightward midline shift of 10 mm and partial effacement of the left lateral ventricle and suprasellar cistern. Electronically Signed: By: HGenevie AnnM.D. On: 02/01/2016 17:40     Scheduled Meds: . dexamethasone  4 mg Intravenous Q6H  . [START ON 02/04/2016] fentaNYL  25 mcg Transdermal Q72H  . folic acid  1 mg Oral Daily  . levETIRAcetam  1,000 mg Intravenous Q12H  . loratadine  10 mg Oral Daily   And  . pseudoephedrine  120 mg Oral BID  . senna-docusate  2 tablet Oral QHS  . sodium chloride flush  10-40 mL Intracatheter Q12H   Continuous Infusions:   Principal Problem:   Seizures (HCC) Active Problems:   Non-small cell carcinoma of right lung, stage 4 (HCC)   Chronic fatigue   Metastasis to brain (HCC)   Cerebral edema (HCC)   Midline shift of  brain   Seizure [facility])   Time spent:   Irwin Brakeman, MD, FAAFP [facility] Pager [phone]  If 7PM-7AM, please contact night-coverage [email or web address] Password [email or web address] 02/02/2016, 8:08 AM    LOS: 1 day

## 2016-02-02 NOTE — Assessment & Plan Note (Signed)
Patient is currently undergoing Tecentriq antibody therapy.  She received cycle 6 of his treatment on 01/24/2016.  She is scheduled to return for labs, flush, visit, and her next cycle of antibody therapy on 02/14/2016.

## 2016-02-02 NOTE — Progress Notes (Signed)
Oncology Nurse Navigator Documentation  Oncology Nurse Navigator Flowsheets 02/02/2016  Navigator Location [facility]  Navigator Encounter Type Other/Went to see Andrea [name] in the hospital today.  She states she is doing well without complaints.  I updated her that radiation oncology will see her later today and update her on next steps with treatment.  I will update CSW and Rad Onc nurse on patient's admission and disease progression.  I will also reach out to chaplin and update her.      Patient Visit Type Inpatient  Treatment Phase Treatment  Barriers/Navigation Needs Education;Coordination of Care  Interventions Other;Education Method;Coordination of Care  Coordination of Care Other  Education Method Verbal  Acuity Level 2  Acuity Level 2 Other;Educational needs  Time Spent with Patient 30

## 2016-02-02 NOTE — Assessment & Plan Note (Signed)
Patient presented to the [facility] today with complaint of chronic headaches to the frontal and temporal region for the last few days.  She's also noted some vision changes as well.  She states that she is having episodes of dizziness as well; even without position changes.  Patient's mother reports that patient had what was an apparent seizure approximately 2 days ago.  Patient was on the couch in the presence of her mother-and began "to stare off into space; and then began shaking with her entire body.  She then fell to the floor off the couch".  Patient then appeared to awaken; and was able to respond to questions and commands.  When asked about this episode-patient has no memory of this episode and remembers waking up on the floor.  She's had no other episodes of this type since that time.  Patient's mother also reports the patient has had several different episodes didn't last 2 weeks of "staring off in to space"; and "not acting herself".   Neurological exam today reveals neurologically intact with no weakness or deficits.  Patient continues to walk with the assistance of a cane secondary to known left iliac metastasis.  Reviewed all findings with Dr. Julien Nordmann; he recommended that patient undergo a brain MRI this afternoon for further evaluation.  Patient will have a brain MRI at 4 PM today; and will remain in the [facility] receiving IV fluid rehydration until that time.  Both patient and her mother were instructed that the scan results will be reviewed with her in detail.  Once these results are obtained this afternoon.  Advised both patient and her mother that-dependent on the results-patient may be required to go directly to the emergency department for further evaluation and management; may be admitted to the hospital; or will be followed up here at the cancer center.  Both patient and her mother were comfortable with this plan.

## 2016-02-02 NOTE — Progress Notes (Signed)
Initial Nutrition Assessment  DOCUMENTATION CODES:   Not applicable  INTERVENTION:   - Provide Premier Protein oral nutrition supplement. Each provides 160 kcal and 30 grams protein. - Encourage PO intake. - Will continue to monitor for nutrition-related needs.  NUTRITION DIAGNOSIS:   Increased nutrient needs related to cancer and cancer related treatments as evidenced by estimated needs.  GOAL:   Patient will meet greater than or equal to 90% of their needs  MONITOR:   PO intake, Supplement acceptance, Labs, Weight trends, I & O's  REASON FOR ASSESSMENT:   Malnutrition Screening Tool   ASSESSMENT:   45 y.o. 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 Pt says for the last week she gets these episodes where she falls to the ground.  Her mother witnessed one and there was jerking. An emergent MRI brain was done which found several new lesions in the brain one in the frontal lobe with associated edema and midline shift to the right.  Spoke with pt and father at bedside. Pt reports poor appetite starting 1 month PTA. Pt states she eats 2 meals per day: lunch and dinner. What pt eats depends on what is available. Pt looking at menu and planning lunch order at time of visit.  Pt states UBW of 150#. Pt states she has gained weight recently. Noted CBW of 157#. Per chart, pt has lost 20# in past 3 months (11.3%, significant for timeframe). Pt states weight has been fluctuating recently.  Pt reports trying oral nutrition supplements during previous hospital admissions. Pt dislikes the taste but is agreeable to trying a different supplement during this admission when given the option. Will order Premier Protein BID between meals.  Pt denies any difficulties chewing or swallowing.  Medications reviewed and include 1 mg folic acid daily, 2,620 mg IV  Keppra daily every 12 hrs, 2 tablets Senokot daily, PRN Zofran  Labs reviewed and include low BUN (4.7 mg/dL)  NFPE: Exam completed. No fat depletion, no muscle depletion, and no edema noted.  Diet Order:  Diet regular Room service appropriate? Yes; Fluid consistency: Thin  Skin:  Reviewed, no issues  Last BM:  PTA  Height:   Ht Readings from Last 1 Encounters:  02/01/16 '5\' 6"'$  (1.676 m)    Weight:   Wt Readings from Last 1 Encounters:  02/02/16 157 lb 3 oz (71.3 kg)    Ideal Body Weight:  59.1 kg  BMI:  Body mass index is 25.37 kg/m.  Estimated Nutritional Needs:   Kcal:  2000-2200 (28-31 kcal/kg)  Protein:  93-107 (1.3-1.5 g/kg)  Fluid:  >/= 2.0 L/day  EDUCATION NEEDS:   No education needs identified at this time  Jeb Levering Dietetic Intern Pager Number: [phone]

## 2016-02-02 NOTE — Assessment & Plan Note (Signed)
Patient does have decreased appetite and very poor oral intake recently.  She denies any nausea, vomiting, or diarrhea.  She feels dehydrated today; will receive 1 L normal saline IV fluid rehydration while cancer Center today.  Following the administration of her IV fluids-patient stated that she felt much better.  She was also able to eat a snack with no difficulty.

## 2016-02-03 ENCOUNTER — Ambulatory Visit
Admit: 2016-02-03 | Discharge: 2016-02-03 | Disposition: A | Discharge status: Home / Self Care | Payer: Medicaid Other | Attending: Radiation Oncology | Admitting: Radiation Oncology

## 2016-02-03 ENCOUNTER — Encounter: Payer: Self-pay | Admitting: General Practice

## 2016-02-03 DIAGNOSIS — C7931 Secondary malignant neoplasm of brain: Secondary | ICD-10-CM

## 2016-02-03 DIAGNOSIS — C3491 Malignant neoplasm of unspecified part of right bronchus or lung: Secondary | ICD-10-CM

## 2016-02-03 MED ORDER — LEVETIRACETAM 500 MG PO TABS
1000.0000 mg | ORAL_TABLET | Freq: Two times a day (BID) | ORAL | Status: DC
  Administered 2016-02-03 – 2016-02-04 (×2): 1000 mg via ORAL
  Filled 2016-02-03 (×2): qty 2

## 2016-02-03 NOTE — Progress Notes (Signed)
PROGRESS NOTE    Andrea [name]  XKP:[id number]  DOB: 1970/09/07  DOA: 02/01/2016 PCP: [name], MD Outpatient Specialists:   Hospital course: Andrea [name] is a 45 y.o. 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  Andrea [name] says for the last week she gets these episodes where she falls to the ground.  Her mother witnessed one and there was jerking.  An emergent MRI brain was done which found several new lesions in the brain one in the frontal lobe with associated edema and midline shift to the right.  Dr Luis Abed with oncology called to arrange direct admit from the MRI department for decadron therapy and radiation treatments.   Assessment & Plan:   1. 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  Change keppra to oral.  Andrea [name] to radiation oncology today for treatment.   2. 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  Starting radiation oncology treatment 10/12.      DVT prophylaxis:  scds Code Status:   full Family Communication:  Mother at bedside Disposition Plan  Per day team Consults called:   Oncology discussed personally with dr Luis Abed Admission status:   admit  Consultants:  Radiation oncology  Subjective: Andrea [name] without complaints, no further seizure activity  Objective: Vitals:   02/02/16 1335 02/02/16 2211 02/03/16 0537 02/03/16 0808  BP: 114/73 104/61 103/71 103/61  Pulse: 96 77 (!) 59 72  Resp: '20 20 16 17  '$ Temp: 97.5 F (36.4 C) 97.9 F (36.6 C) 98.5 F (36.9 C) 97.6 F (36.4 C)  TempSrc: Oral Oral Oral Oral  SpO2: 99% 100% 99% 99%  Weight:   74.6 kg (164 lb 7.4 oz)   Height:        Intake/Output Summary (Last 24 hours) at 02/03/16 1118 Last data filed at 02/03/16  0815  Gross per 24 hour  Intake              440 ml  Output                0 ml  Net              440 ml   Filed Weights   02/01/16 1918 02/02/16 0523 02/03/16 0537  Weight: 71.2 kg (156 lb 15.5 oz) 71.3 kg (157 lb 3 oz) 74.6 kg (164 lb 7.4 oz)    Exam:  Eyes: PERRL, lids and conjunctivae normal ENMT: Mucous membranes are moist. Posterior pharynx clear of any exudate or lesions.Normal dentition.  Neck: normal, supple, no masses, no thyromegaly Respiratory: clear to auscultation bilaterally, no wheezing, no crackles. Normal respiratory effort. No accessory muscle use.  Cardiovascular: Regular rate and rhythm, no murmurs / rubs / gallops. No extremity edema. 2+ pedal pulses. No carotid bruits.  Abdomen: no tenderness, no masses palpated. No hepatosplenomegaly. Bowel sounds positive.  Musculoskeletal: no clubbing / cyanosis. No joint deformity upper and lower extremities. Good ROM, no contractures. Normal muscle tone.  Skin: no rashes, lesions, ulcers. No induration Neurologic: CN 2-12 grossly intact. Sensation intact, DTR normal. Strength 5/5 in all 4.  Psychiatric: Normal judgment and insight. Alert and oriented x 3. Normal mood.   Data Reviewed: Basic Metabolic Panel:  Recent Labs Lab 02/01/16 1100  NA  136  K 4.0  CO2 20*  GLUCOSE 124  BUN 4.7*  CREATININE 0.7  CALCIUM 8.8   Liver Function Tests:  Recent Labs Lab 02/01/16 1100  AST 17  ALT 16  ALKPHOS 198*  BILITOT 0.40  PROT 8.6*  ALBUMIN 3.1*   No results for input(s): LIPASE, AMYLASE in the last 168 hours. No results for input(s): AMMONIA in the last 168 hours. CBC:  Recent Labs Lab 02/01/16 1100  WBC 5.3  NEUTROABS 3.9  HGB 12.9  HCT 39.3  MCV 88.1  PLT 521*   Cardiac Enzymes: No results for input(s): CKTOTAL, CKMB, CKMBINDEX, TROPONINI in the last 168 hours. CBG (last 3)  No results for input(s): GLUCAP in the last 72 hours. No results found for this or any previous visit (from the past 240  hour(s)).   Studies: Mr Jeri Cos QI Contrast  Addendum Date: 02/01/2016   ADDENDUM REPORT: 02/01/2016 18:03 ADDENDUM: Study discussed by telephone with Dr. Burr Medico (on call for provider Drue Second ) on 02/01/2016 at 18:02 . Electronically Signed   By: Genevie Ann M.D.   On: 02/01/2016 18:03   Result Date: 02/01/2016 CLINICAL DATA:  45 year old female with stage IV right lung non-small cell carcinoma. New onset seizure. Subsequent encounter. EXAM: MRI HEAD WITHOUT AND WITH CONTRAST TECHNIQUE: Multiplanar, multiecho pulse sequences of the brain and surrounding structures were obtained without and with intravenous contrast. CONTRAST:  8m MULTIHANCE GADOBENATE DIMEGLUMINE 529 MG/ML IV SOLN COMPARISON:  Brain MRI 12/29/2014. FINDINGS: Brain: At least nine enhancing brain metastases. The largest is a 3 cm lobulated mass in the anterior left frontal lobe white matter with a large area of surrounding vasogenic edema (series 13, image 34 and series 15, image 17). There are associated blood products within this lesion, but no overt intracranial hemorrhage. There is associated left anterior intracranial mass effect with anterior rightward midline shift up to 10 mm. Partial effacement of the left lateral ventricle. Downward mass effect resulting in partial effacement of the suprasellar cistern. The smallest brain metastases are punctate. Two 1.4 - 1.5 cm metastases in the right parietal lobe and right posterior operculum are also associated with moderate vasogenic edema, but only mild regional mass effect. There are 3 small left cerebellar metastases. No dural thickening. No leptomeningeal enhancement. No restricted diffusion or evidence of acute infarction. No extra-axial collection. Negative pituitary. Negative cervicomedullary junction. Vascular: Major intracranial vascular flow voids are preserved. Skull and upper cervical spine: Negative visualized cervical spine and spinal cord. Heterogeneous bone marrow signal but  no destructive osseous lesion identified. Sinuses/Orbits: Visualized paranasal sinuses and mastoids are stable and well pneumatized. Negative orbit soft tissues. Other: Visible internal auditory structures appear normal. Negative scalp soft tissues. IMPRESSION: Positive for at least 9 brain metastases. The largest is 3 cm in the anterior left frontal lobe with a large area of vasogenic edema and mass effect including rightward midline shift of 10 mm and partial effacement of the left lateral ventricle and suprasellar cistern. Electronically Signed: By: HGenevie AnnM.D. On: 02/01/2016 17:40   Scheduled Meds: . dexamethasone  4 mg Intravenous Q6H  . fentaNYL  25 mcg Transdermal Q72H  . folic acid  1 mg Oral Daily  . levETIRAcetam  1,000 mg Oral BID  . loratadine  10 mg Oral Daily  . protein supplement shake  11 oz Oral BID BM  . senna-docusate  2 tablet Oral QHS  . sodium chloride flush  10-40 mL Intracatheter Q12H   Continuous Infusions:  Principal Problem:   Seizures ([place]) Active Problems:   Non-small cell carcinoma of right lung, stage 4 (HCC)   Chronic fatigue   Metastasis to brain (HCC)   Cerebral edema (HCC)   Midline shift of brain   Seizure [facility])  Time spent:   Irwin Brakeman, MD, FAAFP [facility] Pager [phone]  If 7PM-7AM, please contact night-coverage [email or web address] Password [email or web address] 02/03/2016, 11:18 AM    LOS: 2 days

## 2016-02-03 NOTE — Progress Notes (Signed)
[facility] Spiritual Care Note  Visited Ms Muratalla in her inpt room this morning per referral from her [place], Dana Herndon/RN.  Ms Dusseau was pleasant and welcoming, verbalizing no distress at dx or news of brain mets.  I share the concern that she may not be processing the implications of her dx.  She reports limited social support, stating that she turns over her worries to God as her key supporter. She did not endorse miracle theology; again, she did not speak about her dx as a perceived threat to her wellness or survival.  Built rapport, introducing [facility] and team availability.  Plan to follow for support, but please also page if immediate needs arise.  Thank you.   [place], [place], [facility] M-F daytime pager [phone] [facility] 24/7 pager [phone] Voicemail [phone]

## 2016-02-03 NOTE — Progress Notes (Signed)
  Radiation Oncology         [phone] ________________________________  Name: Andrea [name] MRN: [id number]  Date: 02/03/2016  DOB: April 02, 1971  SIMULATION AND TREATMENT PLANNING NOTE    ICD-9-CM ICD-10-CM   1. Metastasis to brain ([place]) 198.3 C79.31     DIAGNOSIS:  45 yo woman with at least 9 brain metastases from metastatic lung cancer  NARRATIVE:  The patient was brought to the [facility].  Identity was confirmed.  All relevant records and images related to the planned course of therapy were reviewed.  The patient freely provided informed written consent to proceed with treatment after reviewing the details related to the planned course of therapy. The consent form was witnessed and verified by the simulation staff.  Then, the patient was set-up in a stable reproducible  supine position for radiation therapy.  CT images were obtained.  Surface markings were placed.  The CT images were loaded into the planning software.  Then the target and avoidance structures were contoured.  Treatment planning then occurred.  The radiation prescription was entered and confirmed.  Then, I designed and supervised the construction of a total of 3 medically necessary complex treatment devices, including a custom made thermoplastic mask used for immobilization and two complex multileaf collimators to cover the entire intracranial contents, while shielding the eyes and face.  Each [facility] is independently created to account for beam divergence.  The right and left lateral fields will be treated with 6 MV X-rays.  I have requested : Isodose Plan.    PLAN:  The whole brain will be treated to 30 Gy in 10 fractions.  ________________________________  Sheral Apley Tammi Klippel, M.D.

## 2016-02-03 NOTE — Progress Notes (Signed)
   02/03/16 1400  Clinical Encounter Type  Visited With Patient and family together  Visit Type Initial;Psychological support  Stress Factors  Patient Stress Factors Health changes  Counseling intern visited with patient and mother at [place] request. Patient appeared alert and oriented and greeted intern warmly and with a smile. Patient indicated that she had learned today that test results had revealed "several" brain tumors, and she stated she planned to ask this afternoon about the exact number of tumors so she'd have a better idea of what she's dealing with.  Intern offered to return on tomorrow to talk welcomed the visit. Intern also offered that the chaplains could offer spiritual support as well.  Lamount Cohen, Counseling Intern Department for Spiritual Care and [facility] Supervisor - [address] [place], [place]

## 2016-02-04 ENCOUNTER — Ambulatory Visit
Admit: 2016-02-04 | Discharge: 2016-02-04 | Disposition: A | Discharge status: Home / Self Care | Payer: Medicaid Other | Attending: Radiation Oncology | Admitting: Radiation Oncology

## 2016-02-04 VITALS — BP 110/75 | HR 60 | Resp 16

## 2016-02-04 DIAGNOSIS — C7931 Secondary malignant neoplasm of brain: Secondary | ICD-10-CM

## 2016-02-04 MED ORDER — LORATADINE 10 MG PO TABS: 10.0000 mg | ORAL_TABLET | Freq: Every day | ORAL | Status: AC

## 2016-02-04 MED ORDER — DEXAMETHASONE 4 MG PO TABS: 4.0000 mg | ORAL_TABLET | Freq: Two times a day (BID) | ORAL | Status: DC

## 2016-02-04 MED ORDER — LEVETIRACETAM 1000 MG PO TABS: 1000.0000 mg | ORAL_TABLET | Freq: Two times a day (BID) | ORAL | 0 refills | Status: DC

## 2016-02-04 MED ORDER — HEPARIN SOD (PORK) LOCK FLUSH 100 UNIT/ML IV SOLN
500.0000 [IU] | INTRAVENOUS | Status: AC | PRN
  Administered 2016-02-04: 500 [IU]

## 2016-02-04 MED ORDER — DEXAMETHASONE 4 MG PO TABS: 4.0000 mg | ORAL_TABLET | Freq: Two times a day (BID) | ORAL | 1 refills | Status: DC

## 2016-02-04 MED FILL — levETIRAcetam 1000 MG TABS: 1000 | 30 days supply | Qty: 60 | Fill #0

## 2016-02-04 NOTE — Discharge Instructions (Signed)
Do Not Drive or operate any machinery until you have been cleared to by a neurologist.     Epilepsy People with epilepsy have times when they shake and jerk uncontrollably (seizures). This happens when there is a sudden change in brain function. Epilepsy may have many possible causes. Anything that disturbs the normal pattern of brain cell activity can lead to seizures. HOME CARE   Follow your doctor's instructions about driving and safety during normal activities.  Get enough sleep.  Only take medicine as told by your doctor.  Avoid things that you know can cause you to have seizures (triggers).  Write down when your seizures happen and what you remember about each seizure. Write down anything you think may have caused the seizure to happen.  Tell the people you live and work with that you have seizures. Make sure they know how to help you. They should:  Cushion your head and body.  Turn you on your side.  Not restrain you.  Not place anything inside your mouth.  Call for local emergency medical help if there is any question about what has happened.  Keep all follow-up visits with your doctor. This is very important. GET HELP IF:  You get an infection or start to feel sick. You may have more seizures when you are sick.  You are having seizures more often.  Your seizure pattern is changing. GET HELP RIGHT AWAY IF:   A seizure does not stop after a few seconds or minutes.  A seizure causes you to have trouble breathing.  A seizure gives you a very bad headache.  A seizure makes you unable to speak or use a part of your body.   This information is not intended to replace advice given to you by your health care provider. Make sure you discuss any questions you have with your health care provider.   Document Released: 02/05/2009 Document Revised: 01/29/2013 Document Reviewed: 11/20/2012 Elsevier Interactive Patient Education 2016 [place].  External Beam Radiation  Therapy, Care After Refer to this sheet in the next few weeks. These instructions provide you with information on caring for yourself after your treatments. Your health care provider may also give you more specific instructions. Your treatment has been planned according to current medical practices, but problems sometimes occur. Call your health care provider if you have any problems or questions after your treatments. WHAT TO EXPECT AFTER THE PROCEDURE After external beam radiation therapy, it is typical to have the following:  Fatigue.  Red, flaking skin in the affected area.  Hair loss in the affected area.  Itching in the affected area. Depending upon which part of your body is exposed to radiation and how much radiation is used, other side effects may occur. For example:  Hair loss may occur if the radiation therapy is directed to your head.  Coughing or difficulty swallowing may occur if the radiation therapy is directed to your head, neck, or chest.  Nausea, vomiting, or diarrhea may occur if the radiation therapy is directed to your abdomen or pelvis.  Bladder problems, frequent urination, or sexual dysfunction may occur if the radiation therapy is directed to your bladder, kidney, or prostate. Most side effects are usually temporary and lessen over time. It can take up to 3-4 weeks for you to regain your energy or for side effects to lessen.  HOME CARE INSTRUCTIONS  Take medicines only as directed by your health care provider.  Do not use a heating pad or  a warm cloth to relieve pain in your affected area.  Wash your skin with a mild soap as directed by your health care provider. Do not scrub or rub your skin. Pat yourself dry.  Apply lotion or cream to your affected area as directed by your health care provider. Sunscreen does not protect you from the radiation burn and is not recommended.  Keep your affected area covered when outdoors.  Avoid scratching your affected  area.  Follow your health care provider's advice for the type and amount of liquids to drink each day.  Maintain your weight during treatment.  Schedule and attend follow-up visits as directed by your health care provider. These are usually scheduled 6 weeks to 6 months after radiation therapy. They are needed to determine if the therapy helped reduce your pain or eliminate your cancer cells. It is important to keep all your appointments. SEEK MEDICAL CARE IF:  You have a fever.  You have increased pain in your affected area.  The redness worsens in your affected area.  Your affected skin develops open areas or blisters.  You have prolonged nausea or vomiting.  You have prolonged diarrhea.  You have an unexplained weight loss.   This information is not intended to replace advice given to you by your health care provider. Make sure you discuss any questions you have with your health care provider.   Document Released: 04/15/2013 Document Revised: 05/01/2014 Document Reviewed: 04/15/2013 Elsevier Interactive Patient Education 2016 [employer]  External Beam Radiation Therapy External beam radiation therapy is a radiation treatment. It may be done to:  Treat cancer. The radiation may be used to:  Destroy cancer cells. Radiation delivered during the treatment damages cancer cells. It also damages normal cells, but normal cells have the DNA to repair themselves while cancer cells do not.  Help with symptoms of your cancer.  Stop the growth of any remaining cancer cells after surgery.  Prevent cancer cells from growing in areas that do not have evidence of cancer (prophylactic radiation therapy).  Treat or shrink a tumor.  Reduce pain (palliative therapy). The therapy delivers higher doses of radiation than X-rays, CT scans, and most other imaging tests. Compared with internal radiation therapy, external beam radiation therapy can deliver radiation to a fairly large area.    The amount of radiation you will receive and the length of therapy depends on your medical condition. You should not feel the radiation being delivered or any pain during your therapy. RISKS AND COMPLICATIONS Most people experience side effects from the therapy. Side effects depend on the amount of radiation and the part of your body exposed to radiation. For example:  Hair loss may occur if the radiation therapy is directed to your head.  Coughing or difficulty swallowing may occur if the radiation therapy is directed to your head, neck, or chest.  Nausea, vomiting, or diarrhea may occur if the radiation therapy is directed to your abdomen or pelvis.  Bladder problems, frequent urination, or sexual dysfunction may occur if the radiation therapy is directed to your bladder, kidney, or prostate. Regardless of the amount or location of the radiation, you will probably have fatigue. Other side effects may include:  Red, flaking skin in the affected area.  Hair loss in the affected area.  Itching in the affected area. Side effects may take 2-3 weeks to develop. Most side effects are temporary and can be controlled. Once the therapy is complete, side effects will not stop right away.  It can take up to 3-4 weeks for you to regain your energy or for side effects to lessen. Your body does heal from the radiation. BEFORE THE PROCEDURE There will be a planning session (simulation). During the session:  Your health care provider will plan exactly where the radiation will be delivered (treatment field).  You will be positioned for your therapy. The goal is to have a position that can be reproduced for each therapy session.  Temporary marks may be drawn on your body. Permanent marks may also be drawn on your body in order for you to be positioned the same way for each therapy session. PROCEDURE  You will either lie on a table or sit in a chair in the position determined for your therapy.  The  radiation machine (linear accelerator) will move around you to deliver the radiation in exact doses from many angles. AFTER THE PROCEDURE You may return to your normal schedule including diet, activities, and medicines, unless your health care provider tells you otherwise.   This information is not intended to replace advice given to you by your health care provider. Make sure you discuss any questions you have with your health care provider.   Document Released: 08/27/2008 Document Revised: 12/30/2014 Document Reviewed: 03/19/2013 Elsevier Interactive Patient Education [employer].

## 2016-02-04 NOTE — Progress Notes (Signed)
  Radiation Oncology         [phone]) [id number] INPATIENT  Name: Andrea [name] MRN: [id number]   Date: 02/04/2016  DOB: 10-01-1970   Weekly Radiation Therapy Management    ICD-9-CM ICD-10-CM   1. 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  Received inpatient in the clinic following radiation treatment. Patient alert and oriented x 3. No distress noted. Receiving decadron 4 mg IV every six hours. Questionable thrush noted on tongue. Reports nausea yesterday resolved with medication. Reports an occasional unsteady gait. Reports fatigue. Reports floaters. Denies diplopia. Denies ringing in her ears. Reports occasional headaches. Denies pain.    The patient is without complaint. Set-up films were reviewed. The chart was checked.  Physical Findings  blood pressure is 110/75 and her pulse is 60. Her respiration is 16 and oxygen saturation is 100%. . Weight essentially stable.  No significant changes. Patient presents in a wheelchair.  Impression The patient is tolerating radiation.  Plan Continue treatment as planned.         Andrea [name] Andrea [name], M.D. This document serves as a record of services personally performed by Andrea Pita, MD. It was created on his behalf by Andrea [name], a trained medical scribe. The creation of this record is based on the scribe's personal observations and the provider's statements to them. This document has been checked and approved by the attending provider.

## 2016-02-04 NOTE — Progress Notes (Signed)
D/C instructions reviewed w/ pt and mother. Both verbalize understanding and all questions answered. Mother has p/u Keppra script from pharmacy and pt d/c in stable condition to mother's car in w/c by NT. Pt in possession of d/c instructions, script for loratadine ( which she states she already has at home), and all personal belongings.

## 2016-02-04 NOTE — Progress Notes (Signed)
Received inpatient in the clinic following radiation treatment. Patient alert and oriented x 3. No distress noted. Receiving decadron 4 mg IV every six hours. Questionable thrush noted on tongue. Reports nausea yesterday resolved with medication. Reports an occasional unsteady gait. Reports fatigue. Reports floaters. Denies diplopia. Denies ringing in her ears. Reports occasional headaches. Denies pain.   BP 110/75 (BP Location: Left Arm, Patient Position: Sitting, Cuff Size: Normal)   Pulse 60   Resp 16   SpO2 100%  Wt Readings from Last 3 Encounters:  02/04/16 160 lb 15 oz (73 kg)  01/24/16 160 lb 1.6 oz (72.6 kg)  01/03/16 167 lb 9.6 oz (76 kg)

## 2016-02-04 NOTE — Care Management Note (Signed)
Case Management Note  Patient Details  Name: Andrea [name] MRN: [id number] Date of Birth: Apr 06, 1971  Subjective/Objective:   Patient for d/c today. Patient uses a cane.Will stay w/her mother 2 d/c-[name] [address], [place]. Patient's cell#[phone]. [facility] chosen for HHC-[name] rep aware-would recc HHRN-safety eval, Education officer, museum.Marland KitchenAwait PT recc.                  Action/Plan:d/c home w/HHC.   Expected Discharge Date:                  Expected Discharge Plan:  [name]  In-House Referral:     Discharge planning Services  CM Consult  Post Acute Care Choice:    Choice offered to:  Patient  DME Arranged:    DME Agency:     HH Arranged:  Social Work, Engineer, materials Agency:  [place]  Status of Service:  In process, will continue to follow  If discussed at Long Length of Stay Meetings, dates discussed:    Additional Comments:  [name], RN 02/04/2016, 4:02 PM

## 2016-02-04 NOTE — Progress Notes (Signed)
CMT called saying pt was off the monitor. Entered room to find leads off and PAC deaccessed. Pt is A&O. Leads placed back on pt and IV team notified. Will continue to monitor.

## 2016-02-04 NOTE — Progress Notes (Signed)
   02/04/16 1700  Clinical Encounter Type  Visited With Patient and family together  Visit Type Follow-up;Psychological support  Counseling intern visited with patient at approximately 2:45 pm. Patient indicated she was preparing for a nap and asked if counselor could return later in the day. Patient also signed the consent to audio-record form that counselor had discussed during the initial visit on 02/03/2016. Consent will be kept on file in the Department for [place]. Counselor returned to visit with patient at 5:00 pm, but patient was preparing for discharge. Counselor stated she would check in with patient the next time she was at the [facility] and patient welcomed the meeting.  Lamount Cohen, Counseling Intern Department for Spiritual Care and [facility] Supervisor - [address] [place], [place]

## 2016-02-04 NOTE — Progress Notes (Signed)
PROGRESS NOTE   Andrea [name]  MCN:[id number]  DOB: [birth year]  DOA: 02/01/2016 PCP: [name], MD Outpatient Specialists:  Hospital course: Andrea [name] is a 45 y.o. 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  Pt says for the last week she gets these episodes where she falls to the ground.  Her mother witnessed one and there was jerking.  An emergent MRI brain was done which found several new lesions in the brain one in the frontal lobe with associated edema and midline shift to the right.  Dr Burr Medico with oncology called to arrange direct admit from the MRI department for decadron therapy and radiation treatments.   Assessment & Plan:   1. 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  Change keppra to oral.  Pt to radiation oncology today for treatment.  Possible discharge later today.  Pt to continue radiation treatments on Monday.   2. 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  Starting radiation oncology treatment 10/12.     DVT prophylaxis:  scds Code Status:   full Family Communication:  Mother at bedside Disposition Plan  Discharge later today Consults called:   Oncology discussed personally with dr Luis Abed Admission status:   admit  Consultants:  Radiation oncology  Subjective: Pt tolerated first session of radiation therapy, no pain,  no further seizure activity  Objective: Vitals:   02/03/16 1404 02/03/16 2106 02/04/16 0546 02/04/16 0547  BP: 107/75 108/72 109/75   Pulse: 67 61 67   Resp: '18 18 20   '$ Temp: 97.5 F (36.4 C) 98.1 F (36.7 C) 98.4 F (36.9 C)   TempSrc: Oral Oral Oral   SpO2: 99% 100% 100%   Weight:    73 kg (160 lb 15 oz)  Height:    '5\' 6"'$   (1.676 m)    Intake/Output Summary (Last 24 hours) at 02/04/16 1146 Last data filed at 02/04/16 0600  Gross per 24 hour  Intake              860 ml  Output                2 ml  Net              858 ml   Filed Weights   02/02/16 0523 02/03/16 0537 02/04/16 0547  Weight: 71.3 kg (157 lb 3 oz) 74.6 kg (164 lb 7.4 oz) 73 kg (160 lb 15 oz)    Exam:  Eyes: PERRL, lids and conjunctivae normal ENMT: Mucous membranes are moist. Posterior pharynx clear of any exudate or lesions.Normal dentition.  Neck: normal, supple, no masses, no thyromegaly Respiratory: clear to auscultation bilaterally, no wheezing, no crackles. Normal respiratory effort. No accessory muscle use.  Cardiovascular: Regular rate and rhythm, no murmurs / rubs / gallops. No extremity edema. 2+ pedal pulses. No carotid bruits.  Abdomen: no tenderness, no masses palpated. No hepatosplenomegaly. Bowel sounds positive.  Musculoskeletal: no clubbing / cyanosis. No joint deformity upper and lower extremities. Good ROM, no contractures. Normal muscle tone.  Skin: no rashes, lesions, ulcers. No induration Neurologic: CN 2-12 grossly intact. Sensation intact, DTR normal. Strength 5/5 in all 4.  Psychiatric: Normal judgment and insight. Alert and oriented x 3. Normal  mood.   Data Reviewed: Basic Metabolic Panel:  Recent Labs Lab 02/01/16 1100  NA 136  K 4.0  CO2 20*  GLUCOSE 124  BUN 4.7*  CREATININE 0.7  CALCIUM 8.8   Liver Function Tests:  Recent Labs Lab 02/01/16 1100  AST 17  ALT 16  ALKPHOS 198*  BILITOT 0.40  PROT 8.6*  ALBUMIN 3.1*   No results for input(s): LIPASE, AMYLASE in the last 168 hours. No results for input(s): AMMONIA in the last 168 hours. CBC:  Recent Labs Lab 02/01/16 1100  WBC 5.3  NEUTROABS 3.9  HGB 12.9  HCT 39.3  MCV 88.1  PLT 521*   Cardiac Enzymes: No results for input(s): CKTOTAL, CKMB, CKMBINDEX, TROPONINI in the last 168 hours. CBG (last 3)  No results for input(s):  GLUCAP in the last 72 hours. No results found for this or any previous visit (from the past 240 hour(s)).   Studies: No results found. Scheduled Meds: . dexamethasone  4 mg Intravenous Q6H  . fentaNYL  25 mcg Transdermal Q72H  . folic acid  1 mg Oral Daily  . levETIRAcetam  1,000 mg Oral BID  . loratadine  10 mg Oral Daily  . protein supplement shake  11 oz Oral BID BM  . senna-docusate  2 tablet Oral QHS  . sodium chloride flush  10-40 mL Intracatheter Q12H   Continuous Infusions:   Principal Problem:   Seizures (HCC) Active Problems:   Non-small cell carcinoma of right lung, stage 4 (HCC)   Chronic fatigue   Metastasis to brain (HCC)   Cerebral edema (HCC)   Midline shift of brain   Seizure ([place])  Time spent:   Irwin Brakeman, MD, FAAFP [facility] Pager [phone]  If 7PM-7AM, please contact night-coverage [email or web address] Password [email or web address] 02/04/2016, 11:46 AM    LOS: 3 days

## 2016-02-04 NOTE — Evaluation (Signed)
Physical Therapy One Time Evaluation Patient Details Name: Andrea [name] MRN: [id number] DOB: October 30, 1970 Today's Date: 02/04/2016   History of Present Illness  Pt is a 45 year old female with hx of L hip mass and non small cell lung cancer with metastatic disease admitted for seizure secondary to metastatic brain disease   Clinical Impression  Patient evaluated by Physical Therapy with no further acute PT needs identified. All education has been completed and the patient has no further questions.  Per staff, pt likely to d/c home today so returned to perform PT evaluation.  Pt able to ambulate with SPC and recommended she continue to use cane all the time upon d/c (states she does not always use).  Pt is agreeable to home safety eval upon d/c. PT is signing off. Thank you for this referral.     Follow Up Recommendations Home health PT (for safety eval)    Equipment Recommendations  None recommended by PT    Recommendations for Other Services       Precautions / Restrictions Precautions Precautions: Fall      Mobility  Bed Mobility Overal bed mobility: Modified Independent                Transfers Overall transfer level: Needs assistance Equipment used: Straight cane Transfers: Sit to/from Stand Sit to Stand: Supervision            Ambulation/Gait Ambulation/Gait assistance: Min guard Ambulation Distance (Feet): 300 Feet Assistive device: Straight cane Gait Pattern/deviations: Step-through pattern     General Gait Details: pt uses cane appropriately, one instance of unsteadiness however self corrected  Stairs            Wheelchair Mobility    Modified Rankin (Stroke Patients Only)       Balance Overall balance assessment: History of Falls                             High Level Balance Comments: pt contributes her falls prior to arrival to "blacking out" episodes not imbalance or dizziness episodes.   She is able to perform SLS bil  with SPC for support for at least 5 seconds each; able to perform head turns and start/stop while ambulating with SPC without difficulty, pt did have a little difficulty with walking backwards however did not require physical assist.             Pertinent Vitals/Pain Pain Assessment: No/denies pain    Home Living Family/patient expects to be discharged to:: Private residence Living Arrangements: Alone Available Help at Discharge: Family (mother) Type of Home: House Home Access: Level entry     Home Layout: One level Home Equipment: Cane - single point;Walker - 2 wheels      Prior Function Level of Independence: Independent with assistive device(s)               Hand Dominance        Extremity/Trunk Assessment               Lower Extremity Assessment: LLE deficits/detail   LLE Deficits / Details: pt reports hx of L LE weakness from "hip mass"     Communication   Communication: No difficulties  Cognition Arousal/Alertness: Awake/alert Behavior During Therapy: WFL for tasks assessed/performed Overall Cognitive Status: Within Functional Limits for tasks assessed  General Comments      Exercises     Assessment/Plan    PT Assessment All further PT needs can be met in the next venue of care  PT Problem List Decreased strength;Decreased balance          PT Treatment Interventions      PT Goals (Current goals can be found in the Care Plan section)  Acute Rehab PT Goals PT Goal Formulation: All assessment and education complete, DC therapy    Frequency     Barriers to discharge        Co-evaluation               End of Session Equipment Utilized During Treatment: Gait belt Activity Tolerance: Patient tolerated treatment well Patient left: in bed;with call bell/phone within reach;with family/visitor present Nurse Communication: Mobility status         Time: 1555-1611 PT Time Calculation (min) (ACUTE  ONLY): 16 min   Charges:   PT Evaluation $PT Eval Low Complexity: 1 Procedure     PT G Codes:        Hadlie Gipson,KATHrine E 02/04/2016, 4:25 PM Carmelia Bake, PT, DPT 02/04/2016 Pager: [phone]

## 2016-02-04 NOTE — Progress Notes (Signed)
PT Cancellation Note  Patient Details Name: Andrea [name] MRN: [id number] DOB: 02/19/1971   Cancelled Treatment:    Reason Eval/Treat Not Completed: Fatigue/lethargy limiting ability to participate Pt pleasantly declined reporting she is too fatigued to mobilize today.    [name],KATHrine E 02/04/2016, 3:05 PM Carmelia Bake, PT, DPT 02/04/2016 Pager: [phone]

## 2016-02-04 NOTE — Progress Notes (Signed)
IV team made aware antimicrobial disc is not in place. Will continue to monitor.

## 2016-02-04 NOTE — Care Management Note (Signed)
Case Management Note  Patient Details  Name: Andrea [name] MRN: [id number] Date of Birth: Sep 16, 1970  Subjective/Objective:   PT recc HHRN safety eval, no dme needed. MD notified-HHRN, home social worker, f75forder.                 Action/Plan:d/c home w/HHC.   Expected Discharge Date:                  Expected Discharge Plan:  [facility] [facility] Referral:     Discharge planning Services  CM Consult  Post Acute Care Choice:    Choice offered to:  Patient  DME Arranged:    DME Agency:     HH Arranged:  Social Work, REngineer, materialsAgency:  [place] Status of Service:  Completed, signed off  If discussed at [facility]. [employer] dates discussed:    Additional Comments:  MDessa Phi RN 02/04/2016, 4:16 PM

## 2016-02-04 NOTE — Discharge Summary (Signed)
Physician Discharge Summary  Andrea [name] DOB: [birth year] DOA: 02/01/2016  PCP: [name], MD  Admit date: 02/01/2016 Discharge date: 02/04/2016  Admitted From: Home Disposition:  Home  Recommendations for Outpatient Follow-up:  1. Follow up with oncologist in 1 weeks 2. 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  Pt says for the last week she gets these episodes where she falls to the ground.  Her mother witnessed one and there was jerking.  An emergent MRI brain was done which found several new lesions in the brain one in the frontal lobe with associated edema and midline shift to the right.  Dr Luis Abed with oncology called to arrange direct admit from the MRI department.  Pt says she has been having worsening headaches for over a week.  Denies any fevers.  She says she feels fine right now and has no complaints.  The last "episode" she had was 2 days ago.  No previous seizures.  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 Pt says for the last week she gets these episodes where she falls to the ground. Her mother witnessed one and there was jerking. An emergent MRI brain was done which found several new lesions in the brain one in the frontal lobe with associated edema and midline shift to the right. Dr Burr Medico with oncology called to arrange direct admit from the MRI department for decadron therapy and radiation treatments.   Assessment & Plan:   1. 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  Changed decadron and keppra to oral.  Pt tolerating radiation oncology treatments well.  Discussed with Dr Tammi Klippel, [place] to discharge today.  She will go home with her mother who will be helping care for her.  Pt to continue radiation treatments on Monday.  Pt instructed that she is not to drive or operate a motor vehicle for at least 6 months and until cleared by her doctors and a neurologist.  She verbalized understanding.  2. 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  Started radiation oncology treatment 10/12.   Pt to resume treatments on 10/16 with Dr. Tammi Klippel.   DVT prophylaxis:scds Code Status:full Family Communication:Mother at bedside Disposition Plan Discharge home with mother Consults called:Oncology discussed personally with dr Burr Medico Admission status:admit  Consultants:  Radiation oncology  Discharge Diagnoses:  Principal Problem:   Seizures ([place]) Active Problems:   Non-small cell carcinoma of right lung, stage 4 (HCC)   Chronic fatigue   Metastasis to brain (HCC)   Cerebral edema ([place])   Midline shift of brain   Seizure [facility])    Discharge Instructions     Medication List    STOP taking these medications   loratadine-pseudoephedrine 10-240 MG 24 hr tablet Commonly known as:  CLARITIN-D 24-hour   PRENATAL VITAMIN PO     TAKE these medications   dexamethasone 4 MG tablet Commonly  known as:  DECADRON Take 1 tablet (4 mg total) by mouth 2 (two) times daily. What changed:  how much to take  how to take this  when to take this  additional instructions   fentaNYL 25 MCG/HR patch Commonly known as:  DURAGESIC - dosed mcg/hr Place 1 patch (25 mcg total) onto the skin every 3 (three) days.   folic acid 1 MG tablet Commonly known as:  FOLVITE Take 1 tablet (1 mg  total) by mouth daily.   levETIRAcetam 1000 MG tablet Commonly known as:  KEPPRA Take 1 tablet (1,000 mg total) by mouth 2 (two) times daily.   lidocaine-prilocaine cream Commonly known as:  EMLA Apply 1 teaspoon to skin 1 to 2 hours before use cover to keep in contact with skin   loratadine 10 MG tablet Commonly known as:  CLARITIN Take 1 tablet (10 mg total) by mouth daily. Start taking on:  02/05/2016   MULTIVITAMIN & MINERAL PO Take 2 tablets by mouth daily. Reported on 11/01/2015   oxyCODONE-acetaminophen 10-325 MG tablet Commonly known as:  PERCOCET Take 1 tablet by mouth every 4 (four) hours as needed for pain.   prochlorperazine 10 MG tablet Commonly known as:  COMPAZINE Take 1 tablet (10 mg total) by mouth every 6 (six) hours as needed for nausea or vomiting.   senna-docusate 8.6-50 MG tablet Commonly known as:  Senokot-S Take 2 tablets by mouth at bedtime.      Follow-up Information    Eilleen Kempf., MD. Schedule an appointment as soon as possible for a visit in 1 week(s).   Specialty:  Oncology Contact information: [place] [address] [phone]        Tyler Pita, MD. Go in 3 day(s).   Specialty:  Radiation Oncology Contact information: [place] [address] [place] .   Why:  [place], Environmental education officer information: [address] [place] [address] [phone]          Allergies  Allergen Reactions  . Morphine And Related Rash    Patient states that if she takes more than 30 days she gets rashes.  . Hydrocodone Rash    Procedures/Studies: Mr Kizzie Fantasia Contrast  Addendum Date: 02/01/2016   ADDENDUM REPORT: 02/01/2016 18:03 ADDENDUM: Study discussed by telephone with Dr. Burr Medico (on call for provider Drue Second ) on 02/01/2016 at 18:02 . Electronically Signed   By: Genevie Ann M.D.   On: 02/01/2016 18:03   Result Date:  02/01/2016 CLINICAL DATA:  45 year old [name] with stage IV right lung non-small cell carcinoma. New onset seizure. Subsequent encounter. EXAM: MRI HEAD WITHOUT AND WITH CONTRAST TECHNIQUE: Multiplanar, multiecho pulse sequences of the brain and surrounding structures were obtained without and with intravenous contrast. CONTRAST:  5m MULTIHANCE GADOBENATE DIMEGLUMINE 529 MG/ML IV SOLN COMPARISON:  Brain MRI 12/29/2014. FINDINGS: Brain: At least nine enhancing brain metastases. The largest is a 3 cm lobulated mass in the anterior left frontal lobe white matter with a large area of surrounding vasogenic edema (series 13, image 34 and series 15, image 17). There are associated blood products within this lesion, but no overt intracranial hemorrhage. There is associated left anterior intracranial mass effect with anterior rightward midline shift up to 10 mm. Partial effacement of the left lateral ventricle. Downward mass effect resulting in partial effacement of the suprasellar cistern. The smallest brain metastases are punctate. Two 1.4 -  1.5 cm metastases in the right parietal lobe and right posterior operculum are also associated with moderate vasogenic edema, but only mild regional mass effect. There are 3 small left cerebellar metastases. No dural thickening. No leptomeningeal enhancement. No restricted diffusion or evidence of acute infarction. No extra-axial collection. Negative pituitary. Negative cervicomedullary junction. Vascular: Major intracranial vascular flow voids are preserved. Skull and upper cervical spine: Negative visualized cervical spine and spinal cord. Heterogeneous bone marrow signal but no destructive osseous lesion identified. Sinuses/Orbits: Visualized paranasal sinuses and mastoids are stable and well pneumatized. Negative orbit soft tissues. Other: Visible internal auditory structures appear normal. Negative scalp soft tissues. IMPRESSION: Positive for at least 9 brain metastases. The  largest is 3 cm in the anterior left frontal lobe with a large area of vasogenic edema and mass effect including rightward midline shift of 10 mm and partial effacement of the left lateral ventricle and suprasellar cistern. Electronically Signed: By: Genevie Ann M.D. On: 02/01/2016 17:40     Subjective: Pt without complaints, tolerating radiation therapy.   Discharge Exam: Vitals:   02/04/16 0546 02/04/16 1425  BP: 109/75 (!) 106/58  Pulse: 67 73  Resp: 20 18  Temp: 98.4 F (36.9 C) 98.4 F (36.9 C)   Vitals:   02/03/16 2106 02/04/16 0546 02/04/16 0547 02/04/16 1425  BP: 108/72 109/75  (!) 106/58  Pulse: 61 67  73  Resp: '18 20  18  '$ Temp: 98.1 F (36.7 C) 98.4 F (36.9 C)  98.4 F (36.9 C)  TempSrc: Oral Oral  Oral  SpO2: 100% 100%  100%  Weight:   73 kg (160 lb 15 oz)   Height:   '5\' 6"'$  (1.676 m)     General: Pt is alert, awake, not in acute distress Cardiovascular: RRR, S1/S2 +, no rubs, no gallops Respiratory: CTA bilaterally, no wheezing, no rhonchi Abdominal: Soft, NT, ND, bowel sounds + Extremities: no edema, no cyanosis  The results of significant diagnostics from this hospitalization (including imaging, microbiology, ancillary and laboratory) are listed below for reference.     Microbiology: No results found for this or any previous visit (from the past 240 hour(s)).   Labs: BNP (last 3 results) No results for input(s): BNP in the last 8760 hours. Basic Metabolic Panel:  Recent Labs Lab 02/01/16 1100  NA 136  K 4.0  CO2 20*  GLUCOSE 124  BUN 4.7*  CREATININE 0.7  CALCIUM 8.8   Liver Function Tests:  Recent Labs Lab 02/01/16 1100  AST 17  ALT 16  ALKPHOS 198*  BILITOT 0.40  PROT 8.6*  ALBUMIN 3.1*   No results for input(s): LIPASE, AMYLASE in the last 168 hours. No results for input(s): AMMONIA in the last 168 hours. CBC:  Recent Labs Lab 02/01/16 1100  WBC 5.3  NEUTROABS 3.9  HGB 12.9  HCT 39.3  MCV 88.1  PLT 521*   Cardiac  Enzymes: No results for input(s): CKTOTAL, CKMB, CKMBINDEX, TROPONINI in the last 168 hours. BNP: Invalid input(s): POCBNP CBG: No results for input(s): GLUCAP in the last 168 hours. D-Dimer No results for input(s): DDIMER in the last 72 hours. Hgb A1c No results for input(s): HGBA1C in the last 72 hours. Lipid Profile No results for input(s): CHOL, HDL, LDLCALC, TRIG, CHOLHDL, LDLDIRECT in the last 72 hours. Thyroid function studies No results for input(s): TSH, T4TOTAL, T3FREE, THYROIDAB in the last 72 hours.  Invalid input(s): FREET3 Anemia work up No results for input(s): VITAMINB12, FOLATE, FERRITIN, TIBC, IRON, RETICCTPCT  in the last 72 hours. Urinalysis    Component Value Date/Time   COLORURINE AMBER (A) 04/20/2013 1412   APPEARANCEUR CLOUDY (A) 04/20/2013 1412   LABSPEC 1.031 (H) 04/20/2013 1412   PHURINE 6.0 04/20/2013 1412   GLUCOSEU NEGATIVE 04/20/2013 1412   GLUCOSEU NEG mg/dL 03/01/2010 2106   HGBUR LARGE (A) 04/20/2013 1412   HGBUR small 03/01/2010 1427   BILIRUBINUR SMALL (A) 04/20/2013 1412   KETONESUR >80 (A) 04/20/2013 1412   PROTEINUR 100 (A) 04/20/2013 1412   UROBILINOGEN 1.0 04/20/2013 1412   NITRITE NEGATIVE 04/20/2013 1412   LEUKOCYTESUR NEGATIVE 04/20/2013 1412   Sepsis Labs Invalid input(s): PROCALCITONIN,  WBC,  LACTICIDVEN Microbiology No results found for this or any previous visit (from the past 240 hour(s)).  Time coordinating discharge: 27 minutes  SIGNED:  Irwin Brakeman, MD  [facility] 02/04/2016, 4:33 PM Pager   If 7PM-7AM, please contact night-coverage [email or web address] Password [email or web address]

## 2016-02-07 ENCOUNTER — Telehealth: Payer: Self-pay | Admitting: *Deleted

## 2016-02-07 ENCOUNTER — Ambulatory Visit
Admission: RE | Admit: 2016-02-07 | Discharge: 2016-02-07 | Disposition: A | Discharge status: Home / Self Care | Payer: Medicaid Other | Source: Ambulatory Visit | Attending: Radiation Oncology | Admitting: Radiation Oncology

## 2016-02-07 DIAGNOSIS — Z510 Encounter for antineoplastic radiation therapy: Secondary | ICD-10-CM | POA: Insufficient documentation

## 2016-02-07 DIAGNOSIS — C7951 Secondary malignant neoplasm of bone: Secondary | ICD-10-CM | POA: Diagnosis present

## 2016-02-07 DIAGNOSIS — C3491 Malignant neoplasm of unspecified part of right bronchus or lung: Secondary | ICD-10-CM | POA: Diagnosis not present

## 2016-02-07 DIAGNOSIS — C7931 Secondary malignant neoplasm of brain: Secondary | ICD-10-CM | POA: Diagnosis present

## 2016-02-07 DIAGNOSIS — Z5112 Encounter for antineoplastic immunotherapy: Secondary | ICD-10-CM | POA: Insufficient documentation

## 2016-02-07 NOTE — Telephone Encounter (Signed)
"  Cipriano Mile RN with [facility] [phone]).  Calling to obtain the following verbal orders. 1. Continue skilled nursing care. 2. Have MSW evaluation for PCS application 3. DME assistance with medical equipment"

## 2016-02-08 ENCOUNTER — Telehealth: Payer: Self-pay | Admitting: Medical Oncology

## 2016-02-08 ENCOUNTER — Other Ambulatory Visit: Payer: Self-pay | Admitting: Medical Oncology

## 2016-02-08 ENCOUNTER — Ambulatory Visit
Admission: RE | Admit: 2016-02-08 | Discharge: 2016-02-08 | Disposition: A | Discharge status: Home / Self Care | Payer: Medicaid Other | Source: Ambulatory Visit | Attending: Radiation Oncology | Admitting: Radiation Oncology

## 2016-02-08 DIAGNOSIS — T451X5A Adverse effect of antineoplastic and immunosuppressive drugs, initial encounter: Principal | ICD-10-CM

## 2016-02-08 DIAGNOSIS — Z510 Encounter for antineoplastic radiation therapy: Secondary | ICD-10-CM | POA: Diagnosis not present

## 2016-02-08 DIAGNOSIS — R112 Nausea with vomiting, unspecified: Secondary | ICD-10-CM

## 2016-02-08 MED ORDER — PROCHLORPERAZINE MALEATE 10 MG PO TABS: 10.0000 mg | ORAL_TABLET | Freq: Four times a day (QID) | ORAL | 0 refills | Status: DC | PRN

## 2016-02-08 NOTE — Telephone Encounter (Signed)
Orders givne to [facility] nurse.

## 2016-02-08 NOTE — Progress Notes (Signed)
[facility] nurse is asking for antiemetic -compazine called in . Also asking for PT evaluation for home safety and strengthening and endurance evaluation. Order given

## 2016-02-09 ENCOUNTER — Telehealth: Payer: Self-pay | Admitting: *Deleted

## 2016-02-09 ENCOUNTER — Ambulatory Visit
Admission: RE | Admit: 2016-02-09 | Discharge: 2016-02-09 | Disposition: A | Discharge status: Home / Self Care | Payer: Medicaid Other | Source: Ambulatory Visit | Attending: Radiation Oncology | Admitting: Radiation Oncology

## 2016-02-09 DIAGNOSIS — Z510 Encounter for antineoplastic radiation therapy: Secondary | ICD-10-CM | POA: Diagnosis not present

## 2016-02-09 MED FILL — PROCHLORPERAZINE 10 MG TAB: 10 | 20 days supply | Qty: 60 | Fill #0

## 2016-02-09 NOTE — Telephone Encounter (Signed)
Engineer, materials from [place] @ [phone] stating that pts. mother called her on yesterday to report that Andrea [name] fell at home on yesterday and had a small cut on the side of head but refused to go the ER. She also stated that the PT EVAL was denied medicaid won't cover it. Glenard Haring can be reached @ [phone]

## 2016-02-10 ENCOUNTER — Ambulatory Visit
Admission: RE | Admit: 2016-02-10 | Discharge: 2016-02-10 | Disposition: A | Discharge status: Home / Self Care | Payer: Medicaid Other | Source: Ambulatory Visit | Attending: Radiation Oncology | Admitting: Radiation Oncology

## 2016-02-10 ENCOUNTER — Encounter: Payer: Self-pay | Admitting: Radiation Oncology

## 2016-02-10 VITALS — BP 98/75 | HR 115 | Temp 98.6°F | Ht 66.0 in | Wt 154.2 lb

## 2016-02-10 DIAGNOSIS — C3491 Malignant neoplasm of unspecified part of right bronchus or lung: Secondary | ICD-10-CM

## 2016-02-10 DIAGNOSIS — Z510 Encounter for antineoplastic radiation therapy: Secondary | ICD-10-CM | POA: Diagnosis not present

## 2016-02-10 DIAGNOSIS — C7951 Secondary malignant neoplasm of bone: Secondary | ICD-10-CM

## 2016-02-10 DIAGNOSIS — C7931 Secondary malignant neoplasm of brain: Secondary | ICD-10-CM

## 2016-02-10 DIAGNOSIS — Z5112 Encounter for antineoplastic immunotherapy: Secondary | ICD-10-CM

## 2016-02-10 MED ORDER — FENTANYL 25 MCG/HR TD PT72: 25.0000 ug | MEDICATED_PATCH | TRANSDERMAL | 0 refills | Status: DC

## 2016-02-10 NOTE — Progress Notes (Signed)
  Radiation Oncology         [phone]   Name: Andrea [name] MRN: [id number]   Date: 02/10/2016  DOB: 10-Sep-1970     Weekly Radiation Therapy Management    ICD-9-CM ICD-10-CM   1. Metastasis to brain (HCC) 198.3 C79.31   2. Bone metastases (HCC) 198.5 C79.51 fentaNYL (DURAGESIC - DOSED MCG/HR) 25 MCG/HR patch  3. Non-small cell carcinoma of right lung, stage 4 (HCC) 162.9 C34.91 fentaNYL (DURAGESIC - DOSED MCG/HR) 25 MCG/HR patch  4. Encounter for antineoplastic immunotherapy V58.12 Z51.12 fentaNYL (DURAGESIC - DOSED MCG/HR) 25 MCG/HR patch    Current Dose: 18 Gy  Planned Dose:  30 Gy  Narrative The patient presents for routine under treatment assessment.  Ms. Andrea [name] is here for her 6th fraction of radiation to her Brain. She reports pain a 8/10 in her Pelvis region from her mass. She is taking percocet every 4 hours around the clock. She currently is not wearing her Fentanyl patch since yesterday. Her recent patch fell off and she didn't have another to replace it. She is requesting a refill for Fentanyl patches. She reports occasional nausea which is helped by nausea medicine. She is taking 4 mg of decadron by mouth twice daily. She has occasional headaches. Her gait is unsteady, and she reports weakness, and is using a wheelchair today. She denies ringing in her ears. She does have occasional diplopia. She reports an increased appetite over the past few days, and is eating slightly better than previously. She denies scalp irritation. She had one episode of a fall.   The patient is without complaint. Set-up films were reviewed. The chart was checked.  Physical Findings  height is '5\' 6"'$  (1.676 m) and weight is 154 lb 3.2 oz (69.9 kg). Her temperature is 98.6 F (37 C). Her blood pressure is 98/75 and her pulse is 115 (abnormal). Her oxygen saturation is 98%. . Weight essentially stable.  No significant changes. Patient presents in a wheelchair.  Impression The patient is tolerating  radiation. I discussed beginning a steroid taper with half a tablet in the morning and half at night.  Plan Continue treatment as planned. She will begin a steroid taper by taking dexamethasone 2 mg twice daily from a current dose of 4 mg twice daily. I have refilled her fentanyl patch today for pelvic pain.         Sheral Apley Tammi Klippel, M.D.  This document serves as a record of services personally performed by Tyler Pita, MD. It was created on his behalf by Arlyce Harman, a trained medical scribe. The creation of this record is based on the scribe's personal observations and the provider's statements to them. This document has been checked and approved by the attending provider.

## 2016-02-10 NOTE — Progress Notes (Signed)
Ms. Taulbee is here for her 6th fraction of radiation to her Brain. She reports pain a 8/10 in her Pelvis region from her mass. She is taking percocet every 4 hours around the clock. She currently is not wearing her Fentanyl patch since yesterday, but plans to pick up her prescription today. She reports occasional nausea which is helped by nausea medicine. She is taking '4mg'$  of decadron by mouth twice daily. She has occasional headaches. Her gait is unsteady, and she reports weakness, and is using a wheelchair today. She denies ringing in her ears. She does have occasional diplopia. She reports an increased appetite over the past few days, and is eating slightly better than previously.   BP 98/75   Pulse (!) 115   Temp 98.6 F (37 C)   Ht '5\' 6"'$  (1.676 m)   Wt 154 lb 3.2 oz (69.9 kg)   SpO2 98% Comment: room air  BMI 24.89 kg/m    Wt Readings from Last 3 Encounters:  02/10/16 154 lb 3.2 oz (69.9 kg)  02/04/16 160 lb 15 oz (73 kg)  01/24/16 160 lb 1.6 oz (72.6 kg)

## 2016-02-11 ENCOUNTER — Ambulatory Visit
Admission: RE | Admit: 2016-02-11 | Discharge: 2016-02-11 | Disposition: A | Discharge status: Home / Self Care | Payer: Medicaid Other | Source: Ambulatory Visit | Attending: Radiation Oncology | Admitting: Radiation Oncology

## 2016-02-11 DIAGNOSIS — Z510 Encounter for antineoplastic radiation therapy: Secondary | ICD-10-CM | POA: Diagnosis not present

## 2016-02-11 MED FILL — fentaNYL 25 MCG/HR PT72: 25 | 30 days supply | Qty: 10 | Fill #0

## 2016-02-14 ENCOUNTER — Ambulatory Visit: Payer: Medicaid Other

## 2016-02-14 ENCOUNTER — Ambulatory Visit
Admission: RE | Admit: 2016-02-14 | Discharge: 2016-02-14 | Disposition: A | Discharge status: Home / Self Care | Payer: Medicaid Other | Source: Ambulatory Visit | Attending: Radiation Oncology | Admitting: Radiation Oncology

## 2016-02-14 ENCOUNTER — Ambulatory Visit (HOSPITAL_BASED_OUTPATIENT_CLINIC_OR_DEPARTMENT_OTHER): Payer: Medicaid Other | Admitting: Internal Medicine

## 2016-02-14 ENCOUNTER — Encounter: Payer: Self-pay | Admitting: General Practice

## 2016-02-14 ENCOUNTER — Other Ambulatory Visit (HOSPITAL_BASED_OUTPATIENT_CLINIC_OR_DEPARTMENT_OTHER): Payer: Medicaid Other

## 2016-02-14 ENCOUNTER — Encounter: Payer: Self-pay | Admitting: Internal Medicine

## 2016-02-14 VITALS — BP 98/63 | HR 99 | Temp 98.4°F | Resp 18 | Ht 66.0 in | Wt 158.3 lb

## 2016-02-14 DIAGNOSIS — C7931 Secondary malignant neoplasm of brain: Secondary | ICD-10-CM | POA: Diagnosis not present

## 2016-02-14 DIAGNOSIS — Z5112 Encounter for antineoplastic immunotherapy: Secondary | ICD-10-CM

## 2016-02-14 DIAGNOSIS — C3491 Malignant neoplasm of unspecified part of right bronchus or lung: Secondary | ICD-10-CM | POA: Diagnosis not present

## 2016-02-14 DIAGNOSIS — C7971 Secondary malignant neoplasm of right adrenal gland: Secondary | ICD-10-CM | POA: Diagnosis not present

## 2016-02-14 DIAGNOSIS — C7972 Secondary malignant neoplasm of left adrenal gland: Secondary | ICD-10-CM

## 2016-02-14 DIAGNOSIS — M898X9 Other specified disorders of bone, unspecified site: Secondary | ICD-10-CM

## 2016-02-14 DIAGNOSIS — Z79899 Other long term (current) drug therapy: Secondary | ICD-10-CM | POA: Diagnosis not present

## 2016-02-14 DIAGNOSIS — Z510 Encounter for antineoplastic radiation therapy: Secondary | ICD-10-CM | POA: Diagnosis not present

## 2016-02-14 DIAGNOSIS — Z95828 Presence of other vascular implants and grafts: Secondary | ICD-10-CM

## 2016-02-14 DIAGNOSIS — M899 Disorder of bone, unspecified: Secondary | ICD-10-CM | POA: Diagnosis not present

## 2016-02-14 DIAGNOSIS — R5382 Chronic fatigue, unspecified: Secondary | ICD-10-CM

## 2016-02-14 LAB — CBC WITH DIFFERENTIAL/PLATELET
BASO%: 1.3 % (ref 0.0–2.0)
BASOS ABS: 0.1 10*3/uL (ref 0.0–0.1)
EOS%: 0.4 % (ref 0.0–7.0)
Eosinophils Absolute: 0 10*3/uL (ref 0.0–0.5)
HEMATOCRIT: 36.3 % (ref 34.8–46.6)
HEMOGLOBIN: 11.9 g/dL (ref 11.6–15.9)
LYMPH#: 0.6 10*3/uL — AB (ref 0.9–3.3)
LYMPH%: 12.5 % — ABNORMAL LOW (ref 14.0–49.7)
MCH: 29.2 pg (ref 25.1–34.0)
MCHC: 32.7 g/dL (ref 31.5–36.0)
MCV: 89.3 fL (ref 79.5–101.0)
MONO#: 0.4 10*3/uL (ref 0.1–0.9)
MONO%: 9.2 % (ref 0.0–14.0)
NEUT%: 76.6 % (ref 38.4–76.8)
NEUTROS ABS: 3.6 10*3/uL (ref 1.5–6.5)
Platelets: 323 10*3/uL (ref 145–400)
RBC: 4.07 10*6/uL (ref 3.70–5.45)
RDW: 16.5 % — AB (ref 11.2–14.5)
WBC: 4.7 10*3/uL (ref 3.9–10.3)

## 2016-02-14 LAB — COMPREHENSIVE METABOLIC PANEL
ALBUMIN: 3 g/dL — AB (ref 3.5–5.0)
ALK PHOS: 118 U/L (ref 40–150)
ALT: 24 U/L (ref 0–55)
AST: 13 U/L (ref 5–34)
Anion Gap: 10 mEq/L (ref 3–11)
BUN: 4.8 mg/dL — AB (ref 7.0–26.0)
CALCIUM: 8.6 mg/dL (ref 8.4–10.4)
CO2: 20 mEq/L — ABNORMAL LOW (ref 22–29)
CREATININE: 0.7 mg/dL (ref 0.6–1.1)
Chloride: 104 mEq/L (ref 98–109)
EGFR: >90 mL/min/{1.73_m2} (ref >90)
GLUCOSE: 122 mg/dL (ref 70–140)
Potassium: 3.7 mEq/L (ref 3.5–5.1)
Sodium: 134 mEq/L — ABNORMAL LOW (ref 136–145)
TOTAL PROTEIN: 7.4 g/dL (ref 6.4–8.3)
Total Bilirubin: 0.3 mg/dL (ref 0.20–1.20)

## 2016-02-14 LAB — TSH: TSH: 0.531 m[IU]/L (ref 0.308–3.960)

## 2016-02-14 MED ORDER — HEPARIN SOD (PORK) LOCK FLUSH 100 UNIT/ML IV SOLN
500.0000 [IU] | Freq: Once | INTRAVENOUS | Status: AC | PRN
  Administered 2016-02-14: 500 [IU] via INTRAVENOUS
  Filled 2016-02-14: qty 5

## 2016-02-14 MED ORDER — DEXAMETHASONE 4 MG PO TABS: ORAL_TABLET | ORAL | 1 refills | Status: DC

## 2016-02-14 MED ORDER — SODIUM CHLORIDE 0.9 % IJ SOLN
10.0000 mL | INTRAMUSCULAR | Status: DC | PRN
  Administered 2016-02-14: 10 mL via INTRAVENOUS
  Filled 2016-02-14: qty 10

## 2016-02-14 MED ORDER — OXYCODONE-ACETAMINOPHEN 10-325 MG PO TABS: 1.0000 | ORAL_TABLET | ORAL | 0 refills | Status: DC | PRN

## 2016-02-14 MED FILL — DEXAMETHASONE 4 MG TABLET: 4 | 21 days supply | Qty: 12 | Fill #0

## 2016-02-14 NOTE — Addendum Note (Signed)
Addended by: Malyia Moro, [place] S on: 02/14/2016 10:08 AM   Modules accepted: Orders

## 2016-02-14 NOTE — Progress Notes (Signed)
[facility] [place] Spiritual Care Note  Consulting closely with Thoracic Navigator Hinton Dyer [place]/RN, Lauren Mullis/LCSW, and YHOOILN Alvarez/counseling intern as pt's on-campus support team (to complement her formal off-campus counseling relationship).  Followed up with Elmyra Ricks after her appt with Dr Julien Nordmann.  She was warm and pleasant, while also verbalizing that she's struggling emotionally and physically ("in all ways," per pt).  She chose not to elaborate at that time, noting that she's coping by "turning it all to God."  She welcomes f/u support in infusion and other campus visits and is aware of ongoing Support Team availability.    Introduced myself to her mom, as well, who was tearful but didn't want to cry in front of pt.  Elmyra Ricks is staying with her mom for now.  Mom also aware of ongoing Support Team availability, including for 1:1 support.   [place], [place], [facility] Pager [phone] Voicemail [phone]

## 2016-02-14 NOTE — Progress Notes (Signed)
START ON PATHWAY REGIMEN - Non-Small Cell Lung  [id number]: Docetaxel 75 mg/m2 + Ramucirumab 10 mg/kg q21 Days Until Progression or Unacceptable Toxicity   A cycle is every 21 days:     Ramucirumab (Cyramza (R)) 10 mg/kg in 250 mL NS IV over 60 minutes every 21 days on day 1, PRIOR TO DOCETAXEL. Not stable in D5W - dilute in NS only. Urine protein check recommended at baseline and periodically during therapy Dose Mod: None     Docetaxel (Taxotere(R)) 75 mg/m2 in 250 mL NS IV over 60 minutes every 21 days on day 1 Dose Mod: None Additional Orders: Premedicate with dexamethasone 8 mg PO BID for three days beginning 1 day prior to therapy.  **Always confirm dose/schedule in your pharmacy ordering system**    Patient Characteristics: Stage IV Metastatic, Non Squamous, Third Line - Chemotherapy/Immunotherapy, PS = 0, 1, Prior PD-1/PD-L1 Inhibitor or No Prior PD-1/PD-L1 Inhibitor and Not a Candidate for Immunotherapy AJCC M Stage: 1 AJCC N Stage: 2 AJCC T Stage: 1a Current Disease Status: Distant Metastases AJCC Stage Grouping: IV Histology: Non Squamous Cell ROS1 Rearrangement Status: Did Not Order Test T790M Mutation Status: Not Applicable - EGFR Mutation Negative/Unknown Other Mutations/Biomarkers: No Other Actionable Mutations PD-L1 Expression Status: Quantity Not Sufficient Chemotherapy/Immunotherapy LOT: Third Line Chemotherapy/Immunotherapy Molecular Targeted Therapy: Not Appropriate ALK Translocation Status: Negative Would you be surprised if this patient died  in the next year? I would NOT be surprised if this patient died in the next year EGFR Mutation Status: Negative/Wild Type BRAF V600E Mutation Status: Negative Performance Status: PS = 0, 1 Immunotherapy Candidate Status: Not a Candidate for Immunotherapy Prior Immunotherapy Status: Prior PD-1/PD-L1 Inhibitor  Intent of Therapy: Non-Curative / Palliative Intent, Discussed with Patient

## 2016-02-14 NOTE — Progress Notes (Signed)
    [facility] [phone]   [phone]  OFFICE PROGRESS NOTE  [name], [name], MD [address] [place] [address]  DIAGNOSIS: Stage IV (T1a, N2, M1b) non-small cell lung cancer, adenocarcinoma with negative EGFR mutation diagnosed in August 2016 and presented with right hilar and subcarinal lymphadenopathy as well as large soft tissue mass in the left posterior iliac bone.  PRIOR THERAPY:  1) Status post palliative radiotherapy to the soft tissue mass in the left iliac bone under the care of Dr. Murray. 2) Systemic chemotherapy with carboplatin for AUC of 5 and Alimta 500 MG/M2 every 3 weeks. First dose expected on 02/22/2015. Status post 6 cycles. Starting from cycle #3 Alimta was reduced to 400 MG/M2 secondary to neutropenia after the first 2 cycles. 3) Maintenance systemic chemotherapy with single agent Alimta 500 MG/M2 every 3 weeks. First dose 07/19/2015. Status post 3 cycles. Last dose was given 08/30/2015 discontinued secondary to disease progression. 4)  Immunotherapy treatment with Tecentriq (Atezolizumab) 1200 MG IV every 3 weeks, for his dose 10/14/2015. Status post 6 cycles, discontinued secondary to disease progression. 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 First dose 03/01/2016  INTERVAL HISTORY: Andrea [name] 45 y.o. female returns to the clinic today for follow-up visit accompanied by her mother. The patient completed 6 cycles of immunotherapy with Tecentriq (Atezolizumab) and tolerated her treatment well but unfortunately she was recently diagnosed with multiple brain metastases. She is currently undergoing whole brain irradiation under the care of Dr. Manning. She is tolerating her treatment well. She continues to complain of weakness and fatigue. She also has low back pain and  she is requesting refill of her pain medication. She denied having any significant skin rash or diarrhea. She denied having any significant chest pain, shortness of breath, cough or hemoptysis. She denied having any fever or chills. She has no nausea or vomiting. She is here today for evaluation and discussion of her treatment options.  MEDICAL HISTORY: Past Medical History:  Diagnosis Date  . 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 Anginal pain (HCC)    chest pain not sure what related to  . Anxiety   . Chronic back pain   . Depression   . Dysmenorrhea 12/06/2009   Qualifier: Diagnosis of  By: Herbin RN, Gladys    . Encounter for antineoplastic immunotherapy 10/07/2015  . Family history of adverse reaction to anesthesia    mother stopped breathing after surgery  . Headache    stress  . Low back pain radiating to left leg 11/29/2010  . Non-small cell carcinoma of right lung, stage 4 (HCC) 01/01/2015  . S/P radiation therapy 01/01/2015 through 01/21/2015     Left iliac bone 3500 cGy in 14 sessions   . Shortness of breath dyspnea    with exertion, Pt denies 02/2015    ALLERGIES:  is allergic to morphine and related and hydrocodone.  MEDICATIONS:  Current Outpatient Prescriptions  Medication Sig Dispense Refill  . dexamethasone (DECADRON) 4 MG tablet Take 1 tablet (4 mg total) by mouth 2 (two) times daily. 40 tablet 1  . fentaNYL (DURAGESIC - DOSED MCG/HR) 25 MCG/HR patch Place 1 patch (25 mcg total) onto the skin every 3 (three) days. 10 patch 0  . levETIRAcetam (KEPPRA) 1000 MG tablet Take 1 tablet (1,000 mg total) by mouth   2 (two) times daily. 60 tablet 0  . Multiple Vitamins-Minerals (MULTIVITAMIN & MINERAL PO) Take 2 tablets by mouth daily. Reported on 11/01/2015    . oxyCODONE-acetaminophen (PERCOCET) 10-325 MG tablet Take 1 tablet by mouth every 4 (four) hours as needed for pain. 45 tablet 0  . senna-docusate  (SENOKOT-S) 8.6-50 MG per tablet Take 2 tablets by mouth at bedtime. 30 tablet 0  . folic acid (FOLVITE) 1 MG tablet Take 1 tablet (1 mg total) by mouth daily. 30 tablet 4  . lidocaine-prilocaine (EMLA) cream Apply 1 teaspoon to skin 1 to 2 hours before use cover to keep in contact with skin 30 g PRN  . loratadine (CLARITIN) 10 MG tablet Take 1 tablet (10 mg total) by mouth daily.    . prochlorperazine (COMPAZINE) 10 MG tablet Take 1 tablet (10 mg total) by mouth every 6 (six) hours as needed for nausea or vomiting. 60 tablet 0   No current facility-administered medications for this visit.     SURGICAL HISTORY:  Past Surgical History:  Procedure Laterality Date  . laproscopy surgery to check her tubes    . LUMBAR LAMINECTOMY/DECOMPRESSION MICRODISCECTOMY Left 05/01/2014   Procedure: Microdiscectomy - L5-S1 - left;  Surgeon: Eustace Moore, MD;  Location: [place];  Service: Neurosurgery;  Laterality: Left;    REVIEW OF SYSTEMS:  Constitutional: positive for anorexia and fatigue Eyes: negative Ears, nose, mouth, throat, and face: negative Respiratory: positive for dyspnea on exertion Cardiovascular: negative Gastrointestinal: negative Genitourinary:negative Integument/breast: negative Hematologic/lymphatic: negative Musculoskeletal:positive for back pain Neurological: negative Behavioral/Psych: negative Endocrine: negative Allergic/Immunologic: negative   PHYSICAL EXAMINATION: General appearance: alert, cooperative, fatigued and no distress Head: Normocephalic, without obvious abnormality, atraumatic Neck: no adenopathy, no JVD, supple, symmetrical, trachea midline and thyroid not enlarged, symmetric, no tenderness/mass/nodules Lymph nodes: Cervical, supraclavicular, and axillary nodes normal. Resp: clear to auscultation bilaterally Back: symmetric, no curvature. ROM normal. No CVA tenderness. Cardio: regular rate and rhythm, S1, S2 normal, no murmur, click, rub or gallop GI: soft,  non-tender; bowel sounds normal; no masses,  no organomegaly Extremities: extremities normal, atraumatic, no cyanosis or edema Neurologic: Alert and oriented X 3, normal strength and tone. Normal symmetric reflexes. Normal coordination and gait  ECOG PERFORMANCE STATUS: 1 - Symptomatic but completely ambulatory  Blood pressure 98/63, pulse 99, temperature 98.4 F (36.9 C), temperature source Oral, resp. rate 18, height 5' 6" (1.676 m), weight 158 lb 4.8 oz (71.8 kg), SpO2 100 %.  LABORATORY DATA: Lab Results  Component Value Date   WBC 4.7 02/14/2016   HGB 11.9 02/14/2016   HCT 36.3 02/14/2016   MCV 89.3 02/14/2016   PLT 323 02/14/2016      Chemistry      Component Value Date/Time   NA 134 (L) 02/14/2016 0841   K 3.7 02/14/2016 0841   CL 111 03/08/2015 1100   CO2 20 (L) 02/14/2016 0841   BUN 4.8 (L) 02/14/2016 0841   CREATININE 0.7 02/14/2016 0841      Component Value Date/Time   CALCIUM 8.6 02/14/2016 0841   ALKPHOS 118 02/14/2016 0841   AST 13 02/14/2016 0841   ALT 24 02/14/2016 0841   BILITOT 0.30 02/14/2016 0841       RADIOGRAPHIC STUDIES: Mr Jeri Cos QZ Contrast  Addendum Date: 02/01/2016   ADDENDUM REPORT: 02/01/2016 18:03 ADDENDUM: Study discussed by telephone with Dr. Burr Medico (on call for provider CYNTHIA BACON ) on 02/01/2016 at 18:02 . Electronically Signed   By: Herminio Heads.D.  On: 02/01/2016 18:03   Result Date: 02/01/2016 CLINICAL DATA:  45 year old female with stage IV right lung non-small cell carcinoma. New onset seizure. Subsequent encounter. EXAM: MRI HEAD WITHOUT AND WITH CONTRAST TECHNIQUE: Multiplanar, multiecho pulse sequences of the brain and surrounding structures were obtained without and with intravenous contrast. CONTRAST:  5m MULTIHANCE GADOBENATE DIMEGLUMINE 529 MG/ML IV SOLN COMPARISON:  Brain MRI 12/29/2014. FINDINGS: Brain: At least nine enhancing brain metastases. The largest is a 3 cm lobulated mass in the anterior left frontal lobe white  matter with a large area of surrounding vasogenic edema (series 13, image 34 and series 15, image 17). There are associated blood products within this lesion, but no overt intracranial hemorrhage. There is associated left anterior intracranial mass effect with anterior rightward midline shift up to 10 mm. Partial effacement of the left lateral ventricle. Downward mass effect resulting in partial effacement of the suprasellar cistern. The smallest brain metastases are punctate. Two 1.4 - 1.5 cm metastases in the right parietal lobe and right posterior operculum are also associated with moderate vasogenic edema, but only mild regional mass effect. There are 3 small left cerebellar metastases. No dural thickening. No leptomeningeal enhancement. No restricted diffusion or evidence of acute infarction. No extra-axial collection. Negative pituitary. Negative cervicomedullary junction. Vascular: Major intracranial vascular flow voids are preserved. Skull and upper cervical spine: Negative visualized cervical spine and spinal cord. Heterogeneous bone marrow signal but no destructive osseous lesion identified. Sinuses/Orbits: Visualized paranasal sinuses and mastoids are stable and well pneumatized. Negative orbit soft tissues. Other: Visible internal auditory structures appear normal. Negative scalp soft tissues. IMPRESSION: Positive for at least 9 brain metastases. The largest is 3 cm in the anterior left frontal lobe with a large area of vasogenic edema and mass effect including rightward midline shift of 10 mm and partial effacement of the left lateral ventricle and suprasellar cistern. Electronically Signed: By: HGenevie AnnM.D. On: 02/01/2016 17:40    ASSESSMENT AND PLAN: This is a very pleasant [age]years old African-American female recently diagnosed with metastatic non-small cell lung cancer, adenocarcinoma with negative EGFR mutation status post palliative radiotherapy to the left iliac bone and soft tissue mass. She  is currently undergoing systemic chemotherapy with carboplatin and Alimta status post 6 cycles.  She is currently undergoing maintenance chemotherapy with single agent Alimta status post 3 cycles. She tolerated the first 3 cycles well except for mild nausea. The restaging CT scan of the chest, abdomen and pelvis showed stable disease in the chest but there was development of left adrenal nodule concerning for possibility of adrenal metastasis. There was also mild increase in the size of the right lower lobe and from a hilar nodule. The recent PET scan confirms evidence of disease progression with new bilateral adrenal metastases in addition to hypermetabolic right paratracheal and right common iliac nodal metastasis. She was started on second line treatment with Tecentriq (Huey Bienenstock status post 6 cycles. She tolerated the last cycle of her treatment well with no significant adverse effects. Unfortunately the patient was found to have new metastatic brain lesions and her treatment was discontinued with TGildardo Pounds(Huey Bienenstock. She is currently undergoing whole brain irradiation. I discussed with the patient several options for treatment of her condition including palliative care and hospice referral versus consideration of third line therapy with docetaxel 75 MG/M2 and Cyramza 10 MG/KG every 3 weeks. The patient is interested in proceeding with treatment. I discussed with her the adverse effect of this treatment including but not limited  to alopecia, myelosuppression, nausea and vomiting, peripheral neuropathy, liver or renal dysfunction as well as the risk of pulmonary hemorrhage, GI perforation, hypertension and proteinuria. For premedication, she was advised to take Decadron 8 mg by mouth twice a day the day before, day of and day after the chemotherapy every 3 weeks. She is expected to start the first cycle of this treatment on 03/01/2016. For the pain management, I recommended for the patient to  continue with the fentanyl patch every 3 days in addition to Percocet for breakthrough pain. I gave her a refill for Percocet. She will come back for follow-up visit in 3 weeks for evaluation and management of any adverse effect of her treatment.  She was advised to call immediately if she has any concerning symptoms in the interval. The patient voices understanding of current disease status and treatment options and is in agreement with the current care plan.  All questions were answered. The patient knows to call the clinic with any problems, questions or concerns. We can certainly see the patient much sooner if necessary.  Disclaimer: This note was dictated with voice recognition software. Similar sounding words can inadvertently be transcribed and may not be corrected upon review.

## 2016-02-15 ENCOUNTER — Telehealth: Payer: Self-pay | Admitting: *Deleted

## 2016-02-15 ENCOUNTER — Ambulatory Visit
Admission: RE | Admit: 2016-02-15 | Discharge: 2016-02-15 | Disposition: A | Discharge status: Home / Self Care | Payer: Medicaid Other | Source: Ambulatory Visit | Attending: Radiation Oncology | Admitting: Radiation Oncology

## 2016-02-15 DIAGNOSIS — Z510 Encounter for antineoplastic radiation therapy: Secondary | ICD-10-CM | POA: Diagnosis not present

## 2016-02-15 MED FILL — OXYCODONE-APAP 10-325 TAB: 10-325 | 8 days supply | Qty: 45 | Fill #0

## 2016-02-15 NOTE — Telephone Encounter (Signed)
Per LOS I have scheduled appts and notified the scheduler 

## 2016-02-16 ENCOUNTER — Ambulatory Visit
Admission: RE | Admit: 2016-02-16 | Discharge: 2016-02-16 | Disposition: A | Discharge status: Home / Self Care | Payer: Medicaid Other | Source: Ambulatory Visit | Attending: Radiation Oncology | Admitting: Radiation Oncology

## 2016-02-16 ENCOUNTER — Encounter: Payer: Self-pay | Admitting: Radiation Oncology

## 2016-02-16 DIAGNOSIS — Z510 Encounter for antineoplastic radiation therapy: Secondary | ICD-10-CM | POA: Diagnosis not present

## 2016-02-17 ENCOUNTER — Telehealth: Payer: Self-pay | Admitting: Medical Oncology

## 2016-02-17 NOTE — Telephone Encounter (Signed)
Faxed PCS form to [facility]

## 2016-02-18 ENCOUNTER — Telehealth: Payer: Self-pay | Admitting: *Deleted

## 2016-02-18 NOTE — Telephone Encounter (Signed)
Pt called asking about injection appts scheduled for 02/22/16 & 03/02/16 & wants to know if these are cancelled.  Returned call & informed 10/31 appt is for xgeva for her bones.  Informed will discuss with Dr Nada Maclachlan to see if this is continued.  She also wanted to know if she has to stay with her mom & can she go home & have nurses come there?  She is having problems communicating with her mom.  Message to Dr Mohamed/Pod RN

## 2016-02-20 NOTE — Progress Notes (Signed)
  Radiation Oncology         [phone] ________________________________  Name: Andrea [name] MRN: [id number]  Date: 02/16/2016  DOB: Apr 19, 1971  End of Treatment Note  Diagnosis:   45 yo woman with at least 9 brain metastases from metastatic lung cancer     Indication for treatment:  Palliation       Radiation treatment dates:   02/03/16-02/16/16  Site/dose:   The whole brain was treated to 30 Gy in 10 fractions of 3 Gy  Beams/energy:   Right and Left radiation fields were treated using 6 MV X-rays with custom MLC collimation to shield the eyes and face.  The patient was immobilized with a thermoplastic mask and isocenter was verified with weekly port films.  Narrative: The patient tolerated radiation treatment relatively well.   No acute effects  Plan: The patient has completed radiation treatment. The patient will return to radiation oncology clinic for routine followup in one month. I advised them to call or return sooner if they have any questions or concerns related to their recovery or treatment.  Dexamethasone decreased from 4 mg BID to 2 mg BID at completion  ________________________________  Sheral Apley. Tammi Klippel, M.D.

## 2016-02-22 ENCOUNTER — Ambulatory Visit (HOSPITAL_BASED_OUTPATIENT_CLINIC_OR_DEPARTMENT_OTHER): Payer: Medicaid Other

## 2016-02-22 VITALS — BP 98/62 | HR 100 | Temp 97.6°F | Resp 20

## 2016-02-22 DIAGNOSIS — C3491 Malignant neoplasm of unspecified part of right bronchus or lung: Secondary | ICD-10-CM | POA: Diagnosis not present

## 2016-02-22 DIAGNOSIS — C7951 Secondary malignant neoplasm of bone: Secondary | ICD-10-CM | POA: Diagnosis not present

## 2016-02-22 DIAGNOSIS — M898X9 Other specified disorders of bone, unspecified site: Secondary | ICD-10-CM

## 2016-02-22 MED ORDER — DENOSUMAB 120 MG/1.7ML ~~LOC~~ SOLN
120.0000 mg | Freq: Once | SUBCUTANEOUS | Status: AC
  Administered 2016-02-22: 120 mg via SUBCUTANEOUS
  Filled 2016-02-22: qty 1.7

## 2016-02-22 NOTE — Patient Instructions (Signed)
Denosumab injection  What is this medicine?  DENOSUMAB (den oh sue mab) slows bone breakdown. Prolia is used to treat osteoporosis in women after menopause and in men. Xgeva is used to prevent bone fractures and other bone problems caused by cancer bone metastases. Xgeva is also used to treat giant cell tumor of the bone.  This medicine may be used for other purposes; ask your health care provider or pharmacist if you have questions.  What should I tell my health care provider before I take this medicine?  They need to know if you have any of these conditions:  -dental disease  -eczema  -infection or history of infections  -kidney disease or on dialysis  -low blood calcium or vitamin D  -malabsorption syndrome  -scheduled to have surgery or tooth extraction  -taking medicine that contains denosumab  -thyroid or parathyroid disease  -an unusual reaction to denosumab, other medicines, foods, dyes, or preservatives  -pregnant or trying to get pregnant  -breast-feeding  How should I use this medicine?  This medicine is for injection under the skin. It is given by a health care professional in a hospital or clinic setting.  If you are getting Prolia, a special MedGuide will be given to you by the pharmacist with each prescription and refill. Be sure to read this information carefully each time.  For Prolia, talk to your pediatrician regarding the use of this medicine in children. Special care may be needed. For Xgeva, talk to your pediatrician regarding the use of this medicine in children. While this drug may be prescribed for children as young as 13 years for selected conditions, precautions do apply.  Overdosage: If you think you have taken too much of this medicine contact a poison control center or emergency room at once.  NOTE: This medicine is only for you. Do not share this medicine with others.  What if I miss a dose?  It is important not to miss your dose. Call your doctor or health care professional if you are  unable to keep an appointment.  What may interact with this medicine?  Do not take this medicine with any of the following medications:  -other medicines containing denosumab  This medicine may also interact with the following medications:  -medicines that suppress the immune system  -medicines that treat cancer  -steroid medicines like prednisone or cortisone  This list may not describe all possible interactions. Give your health care provider a list of all the medicines, herbs, non-prescription drugs, or dietary supplements you use. Also tell them if you smoke, drink alcohol, or use illegal drugs. Some items may interact with your medicine.  What should I watch for while using this medicine?  Visit your doctor or health care professional for regular checks on your progress. Your doctor or health care professional may order blood tests and other tests to see how you are doing.  Call your doctor or health care professional if you get a cold or other infection while receiving this medicine. Do not treat yourself. This medicine may decrease your body's ability to fight infection.  You should make sure you get enough calcium and vitamin D while you are taking this medicine, unless your doctor tells you not to. Discuss the foods you eat and the vitamins you take with your health care professional.  See your dentist regularly. Brush and floss your teeth as directed. Before you have any dental work done, tell your dentist you are receiving this medicine.  Do   not become pregnant while taking this medicine or for 5 months after stopping it. Women should inform their doctor if they wish to become pregnant or think they might be pregnant. There is a potential for serious side effects to an unborn child. Talk to your health care professional or pharmacist for more information.  What side effects may I notice from receiving this medicine?  Side effects that you should report to your doctor or health care professional as soon as  possible:  -allergic reactions like skin rash, itching or hives, swelling of the face, lips, or tongue  -breathing problems  -chest pain  -fast, irregular heartbeat  -feeling faint or lightheaded, falls  -fever, chills, or any other sign of infection  -muscle spasms, tightening, or twitches  -numbness or tingling  -skin blisters or bumps, or is dry, peels, or red  -slow healing or unexplained pain in the mouth or jaw  -unusual bleeding or bruising  Side effects that usually do not require medical attention (Report these to your doctor or health care professional if they continue or are bothersome.):  -muscle pain  -stomach upset, gas  This list may not describe all possible side effects. Call your doctor for medical advice about side effects. You may report side effects to FDA at 1-800-FDA-1088.  Where should I keep my medicine?  This medicine is only given in a clinic, doctor's office, or other health care setting and will not be stored at home.  NOTE: This sheet is a summary. It may not cover all possible information. If you have questions about this medicine, talk to your doctor, pharmacist, or health care provider.      2016, [employer]vier/[employer] Standard. (2011-10-09 12:37:47)

## 2016-02-23 ENCOUNTER — Telehealth: Payer: Self-pay | Admitting: *Deleted

## 2016-02-23 NOTE — Telephone Encounter (Signed)
CALL FROM PATIENT ASKING WHAT PAPER (AVS) IS CORRECT.  THIS NURSE CALLED PATIENT EXPLAINING SHE NEEDS TO FOLLOW WHAT WAS RECEIVED YESTERDAY.  NOVEMBER 8TH SHE IS FOR LAB AND 4 HR. TREATMENT.  NO FURTHER QUESTIONS.

## 2016-02-24 ENCOUNTER — Telehealth: Payer: Self-pay | Admitting: *Deleted

## 2016-02-24 NOTE — Telephone Encounter (Signed)
Call from [facility], requesting to extend Andrea [name] x 4 weeks, pt pain is uncontrolled by Fentanyl 25 mg patch, pt is not taking percocet on a regular basis as she does not want to " eat it like candy". Pt unable to sit, lays on couch " most of the time".  Pt to start chemotherapy 11/8.  Discussed with Andrea Hurry MD out of office, pt may need to be seen before pain medication can be increased.  Message forward to MD for review.

## 2016-02-24 NOTE — Telephone Encounter (Signed)
Pt left message requesting to have her chemo done in one of the beds rather than a chair. Is having severe back pain.  Request sent to scheduler.

## 2016-03-01 ENCOUNTER — Other Ambulatory Visit: Payer: Self-pay | Admitting: Medical Oncology

## 2016-03-01 ENCOUNTER — Ambulatory Visit (HOSPITAL_BASED_OUTPATIENT_CLINIC_OR_DEPARTMENT_OTHER): Payer: Medicaid Other

## 2016-03-01 ENCOUNTER — Other Ambulatory Visit (HOSPITAL_BASED_OUTPATIENT_CLINIC_OR_DEPARTMENT_OTHER): Payer: Medicaid Other

## 2016-03-01 ENCOUNTER — Telehealth: Payer: Self-pay | Admitting: Medical Oncology

## 2016-03-01 ENCOUNTER — Telehealth: Payer: Self-pay | Admitting: Internal Medicine

## 2016-03-01 VITALS — BP 143/91 | HR 65 | Temp 97.7°F | Resp 18

## 2016-03-01 DIAGNOSIS — Z5112 Encounter for antineoplastic immunotherapy: Secondary | ICD-10-CM | POA: Diagnosis not present

## 2016-03-01 DIAGNOSIS — M898X9 Other specified disorders of bone, unspecified site: Secondary | ICD-10-CM

## 2016-03-01 DIAGNOSIS — C3491 Malignant neoplasm of unspecified part of right bronchus or lung: Secondary | ICD-10-CM | POA: Diagnosis not present

## 2016-03-01 DIAGNOSIS — Z95828 Presence of other vascular implants and grafts: Secondary | ICD-10-CM

## 2016-03-01 DIAGNOSIS — C7951 Secondary malignant neoplasm of bone: Secondary | ICD-10-CM

## 2016-03-01 DIAGNOSIS — C7971 Secondary malignant neoplasm of right adrenal gland: Secondary | ICD-10-CM

## 2016-03-01 DIAGNOSIS — C7931 Secondary malignant neoplasm of brain: Secondary | ICD-10-CM

## 2016-03-01 DIAGNOSIS — M25552 Pain in left hip: Secondary | ICD-10-CM

## 2016-03-01 DIAGNOSIS — Z5111 Encounter for antineoplastic chemotherapy: Secondary | ICD-10-CM | POA: Diagnosis not present

## 2016-03-01 DIAGNOSIS — Z79899 Other long term (current) drug therapy: Secondary | ICD-10-CM | POA: Diagnosis not present

## 2016-03-01 DIAGNOSIS — C7972 Secondary malignant neoplasm of left adrenal gland: Secondary | ICD-10-CM | POA: Diagnosis not present

## 2016-03-01 LAB — COMPREHENSIVE METABOLIC PANEL
ALBUMIN: 3.2 g/dL — AB (ref 3.5–5.0)
ALT: 18 U/L (ref 0–55)
ANION GAP: 15 meq/L — AB (ref 3–11)
AST: 17 U/L (ref 5–34)
Alkaline Phosphatase: 146 U/L (ref 40–150)
BILIRUBIN TOTAL: 0.31 mg/dL (ref 0.20–1.20)
BUN: 8.9 mg/dL (ref 7.0–26.0)
CO2: 17 meq/L — AB (ref 22–29)
CREATININE: 0.8 mg/dL (ref 0.6–1.1)
Calcium: 9.8 mg/dL (ref 8.4–10.4)
Chloride: 106 mEq/L (ref 98–109)
EGFR: >90 mL/min/{1.73_m2} (ref >90)
GLUCOSE: 229 mg/dL — AB (ref 70–140)
Potassium: 3.5 mEq/L (ref 3.5–5.1)
Sodium: 137 mEq/L (ref 136–145)
TOTAL PROTEIN: 8.9 g/dL — AB (ref 6.4–8.3)

## 2016-03-01 LAB — UA PROTEIN, DIPSTICK - CHCC: Protein, ur: 30 mg/dL

## 2016-03-01 LAB — CBC WITH DIFFERENTIAL/PLATELET
BASO%: 0.4 % (ref 0.0–2.0)
Basophils Absolute: 0 10*3/uL (ref 0.0–0.1)
EOS%: 0 % (ref 0.0–7.0)
Eosinophils Absolute: 0 10*3/uL (ref 0.0–0.5)
HEMATOCRIT: 38.9 % (ref 34.8–46.6)
HEMOGLOBIN: 12.8 g/dL (ref 11.6–15.9)
LYMPH#: 0.4 10*3/uL — AB (ref 0.9–3.3)
LYMPH%: 12.5 % — AB (ref 14.0–49.7)
MCH: 28.9 pg (ref 25.1–34.0)
MCHC: 32.8 g/dL (ref 31.5–36.0)
MCV: 88 fL (ref 79.5–101.0)
MONO#: 0 10*3/uL — AB (ref 0.1–0.9)
MONO%: 1.7 % (ref 0.0–14.0)
NEUT%: 85.4 % — ABNORMAL HIGH (ref 38.4–76.8)
NEUTROS ABS: 2.5 10*3/uL (ref 1.5–6.5)
PLATELETS: 533 10*3/uL — AB (ref 145–400)
RBC: 4.42 10*6/uL (ref 3.70–5.45)
RDW: 16.5 % — AB (ref 11.2–14.5)
WBC: 2.9 10*3/uL — AB (ref 3.9–10.3)

## 2016-03-01 LAB — TSH: TSH: 0.561 m[IU]/L (ref 0.308–3.960)

## 2016-03-01 MED ORDER — SODIUM CHLORIDE 0.9 % IV SOLN
Freq: Once | INTRAVENOUS | Status: AC
  Administered 2016-03-01: 10:00:00 via INTRAVENOUS

## 2016-03-01 MED ORDER — DEXAMETHASONE SODIUM PHOSPHATE 10 MG/ML IJ SOLN
INTRAMUSCULAR | Status: AC
  Filled 2016-03-01: qty 1

## 2016-03-01 MED ORDER — DIPHENHYDRAMINE HCL 50 MG/ML IJ SOLN
INTRAMUSCULAR | Status: AC
  Filled 2016-03-01: qty 1

## 2016-03-01 MED ORDER — SODIUM CHLORIDE 0.9% FLUSH
10.0000 mL | INTRAVENOUS | Status: DC | PRN
  Administered 2016-03-01: 10 mL
  Filled 2016-03-01: qty 10

## 2016-03-01 MED ORDER — HYDROMORPHONE HCL 4 MG/ML IJ SOLN
INTRAMUSCULAR | Status: AC
  Filled 2016-03-01: qty 1

## 2016-03-01 MED ORDER — ACETAMINOPHEN 325 MG PO TABS
ORAL_TABLET | ORAL | Status: AC
  Filled 2016-03-01: qty 2

## 2016-03-01 MED ORDER — ACETAMINOPHEN 325 MG PO TABS
650.0000 mg | ORAL_TABLET | Freq: Once | ORAL | Status: AC
  Administered 2016-03-01: 650 mg via ORAL

## 2016-03-01 MED ORDER — HEPARIN SOD (PORK) LOCK FLUSH 100 UNIT/ML IV SOLN
500.0000 [IU] | Freq: Once | INTRAVENOUS | Status: AC | PRN
  Administered 2016-03-01: 500 [IU]
  Filled 2016-03-01: qty 5

## 2016-03-01 MED ORDER — SODIUM CHLORIDE 0.9 % IV SOLN
10.0000 mg/kg | Freq: Once | INTRAVENOUS | Status: AC
  Administered 2016-03-01: 700 mg via INTRAVENOUS
  Filled 2016-03-01: qty 20

## 2016-03-01 MED ORDER — DIPHENHYDRAMINE HCL 50 MG/ML IJ SOLN
50.0000 mg | Freq: Once | INTRAMUSCULAR | Status: AC
  Administered 2016-03-01: 50 mg via INTRAVENOUS

## 2016-03-01 MED ORDER — DOCETAXEL CHEMO INJECTION 160 MG/16ML
75.0000 mg/m2 | Freq: Once | INTRAVENOUS | Status: AC
  Administered 2016-03-01: 140 mg via INTRAVENOUS
  Filled 2016-03-01: qty 14

## 2016-03-01 MED ORDER — HYDROMORPHONE HCL 4 MG/ML IJ SOLN
2.0000 mg | INTRAMUSCULAR | Status: AC
  Administered 2016-03-01: 2 mg via INTRAVENOUS

## 2016-03-01 MED ORDER — DEXAMETHASONE SODIUM PHOSPHATE 10 MG/ML IJ SOLN
10.0000 mg | Freq: Once | INTRAMUSCULAR | Status: AC
  Administered 2016-03-01: 10 mg via INTRAVENOUS

## 2016-03-01 MED ORDER — OXYCODONE-ACETAMINOPHEN 10-325 MG PO TABS: 1.0000 | ORAL_TABLET | ORAL | 0 refills | Status: DC | PRN

## 2016-03-01 MED FILL — OXYCODONE-APAP 10-325 TAB: 10-325 | 8 days supply | Qty: 45 | Fill #0

## 2016-03-01 NOTE — Telephone Encounter (Signed)
APT. REMINDER CALL, LMTCB °

## 2016-03-01 NOTE — Patient Instructions (Signed)
[name] Discharge Instructions for Patients Receiving Chemotherapy  Today you received the following chemotherapy agents: Cyramza and Taxotere  To help prevent nausea and vomiting after your treatment, we encourage you to take your nausea medication as directed.    If you develop nausea and vomiting that is not controlled by your nausea medication, call the clinic.   BELOW ARE SYMPTOMS THAT SHOULD BE REPORTED IMMEDIATELY:  *FEVER GREATER THAN 100.5 F  *CHILLS WITH OR WITHOUT FEVER  NAUSEA AND VOMITING THAT IS NOT CONTROLLED WITH YOUR NAUSEA MEDICATION  *UNUSUAL SHORTNESS OF BREATH  *UNUSUAL BRUISING OR BLEEDING  TENDERNESS IN MOUTH AND THROAT WITH OR WITHOUT PRESENCE OF ULCERS  *URINARY PROBLEMS  *BOWEL PROBLEMS  UNUSUAL RASH Items with * indicate a potential emergency and should be followed up as soon as possible.  Feel free to call the clinic you have any questions or concerns. The clinic phone number is [phone].  Please show the [facility] at check-in to the Emergency Department and triage nurse.   Ramucirumab injection (Cyramza) What is this medicine? RAMUCIRUMAB (ra mue SIR ue mab) is a monoclonal antibody. It is used to treat stomach cancer, colorectal cancer, or lung cancer. This medicine may be used for other purposes; ask your health care provider or pharmacist if you have questions. What should I tell my health care provider before I take this medicine? They need to know if you have any of these conditions: -bleeding disorders -blood clots -heart disease, including heart failure, heart attack, or chest pain (angina) -high blood pressure -infection (especially a virus infection such as chickenpox, cold sores, or herpes) -protein in your urine -recent surgery -stroke -an unusual or allergic reaction to ramucirumab, other medicines, foods, dyes, or preservatives -pregnant or trying to get pregnant -breast-feeding How should I  use this medicine? This medicine is for infusion into a vein. It is given by a health care professional in a hospital or clinic setting. Talk to your pediatrician regarding the use of this medicine in children. Special care may be needed. Overdosage: If you think you have taken too much of this medicine contact a poison control center or emergency room at once. NOTE: This medicine is only for you. Do not share this medicine with others. What if I miss a dose? It is important not to miss your dose. Call your doctor or health care professional if you are unable to keep an appointment. What may interact with this medicine? Interactions have not been studied. This list may not describe all possible interactions. Give your health care provider a list of all the medicines, herbs, non-prescription drugs, or dietary supplements you use. Also tell them if you smoke, drink alcohol, or use illegal drugs. Some items may interact with your medicine. What should I watch for while using this medicine? Your condition will be monitored carefully while you are receiving this medicine. You will need to to check your blood pressure and have your blood and urine tested while you are taking this medicine. Your condition will be monitored carefully while you are receiving this medicine. This medicine may increase your risk to bruise or bleed. Call your doctor or health care professional if you notice any unusual bleeding. This medicine may rarely cause 'gastrointestinal perforation' (holes in the stomach, intestines or colon), a serious side effect requiring surgery to repair. This medicine should be started at least 28 days following major surgery and the site of the surgery should be totally healed. Check  with your doctor before scheduling dental work or surgery while you are receiving this treatment. Talk to your doctor if you have recently had surgery or if you have a wound that has not healed. Do not become pregnant  while taking this medicine or for 3 months after stopping it. Women should inform their doctor if they wish to become pregnant or think they might be pregnant. There is a potential for serious side effects to an unborn child. Talk to your health care professional or pharmacist for more information. What side effects may I notice from receiving this medicine? Side effects that you should report to your doctor or health care professional as soon as possible: -allergic reactions like skin rash, itching or hives, breathing problems, swelling of the face, lips, or tongue -signs of infection - fever or chills, cough, sore throat -chest pain or chest tightness -confusion -dizziness -feeling faint or lightheaded, falls -severe abdominal pain -severe nausea, vomiting -signs and symptoms of bleeding such as bloody or black, tarry stools; red or dark-brown urine; spitting up blood or brown material that looks like coffee grounds; red spots on the skin; unusual bruising or bleeding from the eye, gums, or nose -signs and symptoms of a blood clot such as breathing problems; changes in vision; chest pain; severe, sudden headache; pain, swelling, warmth in the leg; trouble speaking; sudden numbness or weakness of the face, arm or leg -symptoms of a stroke: change in mental awareness, inability to talk or move one side of the body -trouble walking, dizziness, loss of balance or coordination Side effects that usually do not require medical attention (Report these to your doctor or health care professional if they continue or are bothersome.): -cold, clammy skin -constipation -diarrhea -headache -nausea, vomiting -stomach pain -unusually slow heartbeat -unusually weak or tired This list may not describe all possible side effects. Call your doctor for medical advice about side effects. You may report side effects to FDA at 1-800-FDA-1088. Where should I keep my medicine? This drug is given in a hospital or  clinic and will not be stored at home. NOTE: This sheet is a summary. It may not cover all possible information. If you have questions about this medicine, talk to your doctor, pharmacist, or health care provider.    2016, Elsevier/Gold Standard. (2014-06-09 17:37:19)   Docetaxel injection (Taxotere) What is this medicine? DOCETAXEL (doe se TAX el) is a chemotherapy drug. It targets fast dividing cells, like cancer cells, and causes these cells to die. This medicine is used to treat many types of cancers like breast cancer, certain stomach cancers, head and neck cancer, lung cancer, and prostate cancer. This medicine may be used for other purposes; ask your health care provider or pharmacist if you have questions. What should I tell my health care provider before I take this medicine? They need to know if you have any of these conditions: -infection (especially a virus infection such as chickenpox, cold sores, or herpes) -liver disease -low blood counts, like low white cell, platelet, or red cell counts -an unusual or allergic reaction to docetaxel, polysorbate 80, other chemotherapy agents, other medicines, foods, dyes, or preservatives -pregnant or trying to get pregnant -breast-feeding How should I use this medicine? This drug is given as an infusion into a vein. It is administered in a hospital or clinic by a specially trained health care professional. Talk to your pediatrician regarding the use of this medicine in children. Special care may be needed. Overdosage: If you think you  have taken too much of this medicine contact a poison control center or emergency room at once. NOTE: This medicine is only for you. Do not share this medicine with others. What if I miss a dose? It is important not to miss your dose. Call your doctor or health care professional if you are unable to keep an appointment. What may interact with this  medicine? -cyclosporine -erythromycin -ketoconazole -medicines to increase blood counts like filgrastim, pegfilgrastim, sargramostim -vaccines Talk to your doctor or health care professional before taking any of these medicines: -acetaminophen -aspirin -ibuprofen -ketoprofen -naproxen This list may not describe all possible interactions. Give your health care provider a list of all the medicines, herbs, non-prescription drugs, or dietary supplements you use. Also tell them if you smoke, drink alcohol, or use illegal drugs. Some items may interact with your medicine. What should I watch for while using this medicine? Your condition will be monitored carefully while you are receiving this medicine. You will need important blood work done while you are taking this medicine. This drug may make you feel generally unwell. This is not uncommon, as chemotherapy can affect healthy cells as well as cancer cells. Report any side effects. Continue your course of treatment even though you feel ill unless your doctor tells you to stop. In some cases, you may be given additional medicines to help with side effects. Follow all directions for their use. Call your doctor or health care professional for advice if you get a fever, chills or sore throat, or other symptoms of a cold or flu. Do not treat yourself. This drug decreases your body's ability to fight infections. Try to avoid being around people who are sick. This medicine may increase your risk to bruise or bleed. Call your doctor or health care professional if you notice any unusual bleeding. This medicine may contain alcohol in the product. You may get drowsy or dizzy. Do not drive, use machinery, or do anything that needs mental alertness until you know how this medicine affects you. Do not stand or sit up quickly, especially if you are an older patient. This reduces the risk of dizzy or fainting spells. Avoid alcoholic drinks. Do not become pregnant  while taking this medicine. Women should inform their doctor if they wish to become pregnant or think they might be pregnant. There is a potential for serious side effects to an unborn child. Talk to your health care professional or pharmacist for more information. Do not breast-feed an infant while taking this medicine. What side effects may I notice from receiving this medicine? Side effects that you should report to your doctor or health care professional as soon as possible: -allergic reactions like skin rash, itching or hives, swelling of the face, lips, or tongue -low blood counts - This drug may decrease the number of white blood cells, red blood cells and platelets. You may be at increased risk for infections and bleeding. -signs of infection - fever or chills, cough, sore throat, pain or difficulty passing urine -signs of decreased platelets or bleeding - bruising, pinpoint red spots on the skin, black, tarry stools, nosebleeds -signs of decreased red blood cells - unusually weak or tired, fainting spells, lightheadedness -breathing problems -fast or irregular heartbeat -low blood pressure -mouth sores -nausea and vomiting -pain, swelling, redness or irritation at the injection site -pain, tingling, numbness in the hands or feet -swelling of the ankle, feet, hands -weight gain Side effects that usually do not require medical attention (report to  your prescriber or health care professional if they continue or are bothersome): -bone pain -complete hair loss including hair on your head, underarms, pubic hair, eyebrows, and eyelashes -diarrhea -excessive tearing -changes in the color of fingernails -loosening of the fingernails -nausea -muscle pain -red flush to skin -sweating -weak or tired This list may not describe all possible side effects. Call your doctor for medical advice about side effects. You may report side effects to FDA at 1-800-FDA-1088. Where should I keep my  medicine? This drug is given in a hospital or clinic and will not be stored at home. NOTE: This sheet is a summary. It may not cover all possible information. If you have questions about this medicine, talk to your doctor, pharmacist, or health care provider.    2016, Elsevier/Gold Standard. (2014-04-27 16:04:57)

## 2016-03-01 NOTE — Telephone Encounter (Signed)
Re pain management.

## 2016-03-01 NOTE — Progress Notes (Signed)
Counseling intern met with patient and family during chemotherapy treatment. Patient appeared alert, oriented, and in good spirits, though she described herself as "floating" due to having received pain medication. 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 She expressed anger that the brain tumors had not been detected sooner, noting that when cancer was first found detected, her whole body should have been continuously scanned to detect newly- affected areas. Patient discussed her plans for living independently (with nursing assistance) at her home after living with her mother for the past month or so.   Patient described her extreme physical pain and stated that at times it is so overwhelming, she feels like giving up. She stated that she calls on her network of close friends in those moments, and they encourage her to continue fighting.  Counselor affirmed patient's spirit, staying informed about her progress, and asking for what she needs.  Lamount Cohen, Counseling Intern Department for [facility] and [facility] [address] [place], [place]

## 2016-03-01 NOTE — Progress Notes (Signed)
Patient complains of left hip pain. Patient rates pain a 10 on the 0 to 10 pain scale. Patient states pain is on going. Patient states she takes one prescribed percocet every 4 hours and uses her Fentanyl patch at home. However, she does not receive relief. Patient took one Percocet at 6 am this morning and is wearing her Fentanyl Patch.  Dr. Julien Nordmann notified.

## 2016-03-01 NOTE — Telephone Encounter (Signed)
Increased sacral pain 10/10 cannot lay supine now or for scan tomorrow. Taking percocet q 4 hours. Requests increase in fentanyl patch and iv pain med for today and tomorrow for scan.

## 2016-03-02 ENCOUNTER — Ambulatory Visit (HOSPITAL_COMMUNITY)
Admission: RE | Admit: 2016-03-02 | Discharge: 2016-03-02 | Disposition: A | Discharge status: Home / Self Care | Payer: Medicaid Other | Source: Ambulatory Visit | Attending: Internal Medicine | Admitting: Internal Medicine

## 2016-03-02 ENCOUNTER — Ambulatory Visit (INDEPENDENT_AMBULATORY_CARE_PROVIDER_SITE_OTHER): Payer: Medicaid Other | Admitting: *Deleted

## 2016-03-02 ENCOUNTER — Encounter (HOSPITAL_COMMUNITY): Payer: Self-pay

## 2016-03-02 ENCOUNTER — Other Ambulatory Visit: Payer: Self-pay | Admitting: *Deleted

## 2016-03-02 DIAGNOSIS — Z3042 Encounter for surveillance of injectable contraceptive: Secondary | ICD-10-CM | POA: Diagnosis not present

## 2016-03-02 DIAGNOSIS — C7951 Secondary malignant neoplasm of bone: Secondary | ICD-10-CM | POA: Diagnosis not present

## 2016-03-02 DIAGNOSIS — M899 Disorder of bone, unspecified: Secondary | ICD-10-CM | POA: Insufficient documentation

## 2016-03-02 DIAGNOSIS — Z3040 Encounter for surveillance of contraceptives, unspecified: Secondary | ICD-10-CM | POA: Diagnosis present

## 2016-03-02 DIAGNOSIS — Z95828 Presence of other vascular implants and grafts: Secondary | ICD-10-CM | POA: Insufficient documentation

## 2016-03-02 DIAGNOSIS — R911 Solitary pulmonary nodule: Secondary | ICD-10-CM | POA: Diagnosis not present

## 2016-03-02 DIAGNOSIS — C7972 Secondary malignant neoplasm of left adrenal gland: Secondary | ICD-10-CM | POA: Insufficient documentation

## 2016-03-02 DIAGNOSIS — C3491 Malignant neoplasm of unspecified part of right bronchus or lung: Secondary | ICD-10-CM | POA: Diagnosis not present

## 2016-03-02 DIAGNOSIS — C7971 Secondary malignant neoplasm of right adrenal gland: Secondary | ICD-10-CM | POA: Diagnosis not present

## 2016-03-02 DIAGNOSIS — M898X9 Other specified disorders of bone, unspecified site: Secondary | ICD-10-CM

## 2016-03-02 LAB — T3 UPTAKE
Free Thyroxine Index: 2.1 (ref 1.2–4.9)
T3 UPTAKE RATIO: 23 % — AB (ref 24–39)

## 2016-03-02 LAB — T4: T4 TOTAL: 9.3 ug/dL (ref 4.5–12.0)

## 2016-03-02 LAB — T4, FREE: FREE T4: 1.22 ng/dL (ref 0.82–1.77)

## 2016-03-02 MED ORDER — MEDROXYPROGESTERONE ACETATE 150 MG/ML IM SUSP
150.0000 mg | Freq: Once | INTRAMUSCULAR | Status: AC
  Administered 2016-03-02: 150 mg via INTRAMUSCULAR

## 2016-03-02 MED ORDER — IOPAMIDOL (ISOVUE-300) INJECTION 61%
100.0000 mL | Freq: Once | INTRAVENOUS | Status: AC | PRN
  Administered 2016-03-02: 100 mL via INTRAVENOUS

## 2016-03-02 MED ORDER — IOPAMIDOL (ISOVUE-300) INJECTION 61%
30.0000 mL | Freq: Once | INTRAVENOUS | Status: AC | PRN
  Administered 2016-03-02: 30 mL via ORAL

## 2016-03-03 ENCOUNTER — Encounter: Payer: Self-pay | Admitting: *Deleted

## 2016-03-03 ENCOUNTER — Ambulatory Visit (HOSPITAL_BASED_OUTPATIENT_CLINIC_OR_DEPARTMENT_OTHER): Payer: Medicaid Other

## 2016-03-03 VITALS — BP 116/78 | HR 95 | Temp 97.5°F | Resp 20

## 2016-03-03 DIAGNOSIS — Z5189 Encounter for other specified aftercare: Secondary | ICD-10-CM

## 2016-03-03 DIAGNOSIS — C3491 Malignant neoplasm of unspecified part of right bronchus or lung: Secondary | ICD-10-CM | POA: Diagnosis present

## 2016-03-03 DIAGNOSIS — C7931 Secondary malignant neoplasm of brain: Secondary | ICD-10-CM

## 2016-03-03 MED ORDER — PEGFILGRASTIM INJECTION 6 MG/0.6ML ~~LOC~~
6.0000 mg | PREFILLED_SYRINGE | Freq: Once | SUBCUTANEOUS | Status: AC
  Administered 2016-03-03: 6 mg via SUBCUTANEOUS
  Filled 2016-03-03: qty 0.6

## 2016-03-03 NOTE — Patient Instructions (Signed)
Pegfilgrastim injection What is this medicine? PEGFILGRASTIM (PEG fil gra stim) is a long-acting granulocyte colony-stimulating factor that stimulates the growth of neutrophils, a type of white blood cell important in the body's fight against infection. It is used to reduce the incidence of fever and infection in patients with certain types of cancer who are receiving chemotherapy that affects the bone marrow, and to increase survival after being exposed to high doses of radiation. This medicine may be used for other purposes; ask your health care provider or pharmacist if you have questions. What should I tell my health care provider before I take this medicine? They need to know if you have any of these conditions: -kidney disease -latex allergy -ongoing radiation therapy -sickle cell disease -skin reactions to acrylic adhesives (On-Body Injector only) -an unusual or allergic reaction to pegfilgrastim, filgrastim, other medicines, foods, dyes, or preservatives -pregnant or trying to get pregnant -breast-feeding How should I use this medicine? This medicine is for injection under the skin. If you get this medicine at home, you will be taught how to prepare and give the pre-filled syringe or how to use the On-body Injector. Refer to the patient Instructions for Use for detailed instructions. Use exactly as directed. Take your medicine at regular intervals. Do not take your medicine more often than directed. It is important that you put your used needles and syringes in a special sharps container. Do not put them in a trash can. If you do not have a sharps container, call your pharmacist or healthcare provider to get one. Talk to your pediatrician regarding the use of this medicine in children. While this drug may be prescribed for selected conditions, precautions do apply. Overdosage: If you think you have taken too much of this medicine contact a poison control center or emergency room at  once. NOTE: This medicine is only for you. Do not share this medicine with others. What if I miss a dose? It is important not to miss your dose. Call your doctor or health care professional if you miss your dose. If you miss a dose due to an On-body Injector failure or leakage, a new dose should be administered as soon as possible using a single prefilled syringe for manual use. What may interact with this medicine? Interactions have not been studied. Give your health care provider a list of all the medicines, herbs, non-prescription drugs, or dietary supplements you use. Also tell them if you smoke, drink alcohol, or use illegal drugs. Some items may interact with your medicine. This list may not describe all possible interactions. Give your health care provider a list of all the medicines, herbs, non-prescription drugs, or dietary supplements you use. Also tell them if you smoke, drink alcohol, or use illegal drugs. Some items may interact with your medicine. What should I watch for while using this medicine? You may need blood work done while you are taking this medicine. If you are going to need a MRI, CT scan, or other procedure, tell your doctor that you are using this medicine (On-Body Injector only). What side effects may I notice from receiving this medicine? Side effects that you should report to your doctor or health care professional as soon as possible: -allergic reactions like skin rash, itching or hives, swelling of the face, lips, or tongue -dizziness -fever -pain, redness, or irritation at site where injected -pinpoint red spots on the skin -red or dark-brown urine -shortness of breath or breathing problems -stomach or side pain, or pain   at the shoulder -swelling -tiredness -trouble passing urine or change in the amount of urine Side effects that usually do not require medical attention (report to your doctor or health care professional if they continue or are  bothersome): -bone pain -muscle pain This list may not describe all possible side effects. Call your doctor for medical advice about side effects. You may report side effects to FDA at 1-800-FDA-1088. Where should I keep my medicine? Keep out of the reach of children. Store pre-filled syringes in a refrigerator between 2 and 8 degrees C (36 and 46 degrees F). Do not freeze. Keep in carton to protect from light. Throw away this medicine if it is left out of the refrigerator for more than 48 hours. Throw away any unused medicine after the expiration date. NOTE: This sheet is a summary. It may not cover all possible information. If you have questions about this medicine, talk to your doctor, pharmacist, or health care provider.    2016, Elsevier/Gold Standard. (2014-04-30 14:30:14)  

## 2016-03-03 NOTE — Progress Notes (Signed)
Oncology Nurse Navigator Documentation  Oncology Nurse Navigator Flowsheets 03/03/2016  Navigator Location [facility]  Navigator Encounter Type Other/I received a message from [place] about patient changed her release of information.  I documented the change under patient demographic information.  Lauren  CSW states patient is having pain.  I will update Dr. Julien Nordmann  Patient Visit Type Other  Treatment Phase Treatment  Barriers/Navigation Needs Coordination of Care  Interventions Coordination of Care  Coordination of Care Other  Acuity Level 2  Acuity Level 2 Other  Time Spent with Patient 30

## 2016-03-06 ENCOUNTER — Telehealth: Payer: Self-pay | Admitting: Medical Oncology

## 2016-03-06 ENCOUNTER — Other Ambulatory Visit (HOSPITAL_BASED_OUTPATIENT_CLINIC_OR_DEPARTMENT_OTHER): Payer: Medicaid Other

## 2016-03-06 ENCOUNTER — Encounter: Payer: Self-pay | Admitting: Internal Medicine

## 2016-03-06 ENCOUNTER — Ambulatory Visit (HOSPITAL_BASED_OUTPATIENT_CLINIC_OR_DEPARTMENT_OTHER): Payer: Medicaid Other

## 2016-03-06 ENCOUNTER — Other Ambulatory Visit: Payer: Self-pay | Admitting: Medical Oncology

## 2016-03-06 ENCOUNTER — Ambulatory Visit (HOSPITAL_BASED_OUTPATIENT_CLINIC_OR_DEPARTMENT_OTHER): Payer: Medicaid Other | Admitting: Internal Medicine

## 2016-03-06 ENCOUNTER — Ambulatory Visit: Payer: Self-pay

## 2016-03-06 VITALS — BP 116/78 | HR 115 | Temp 97.8°F | Resp 17 | Ht 66.0 in

## 2016-03-06 DIAGNOSIS — C3491 Malignant neoplasm of unspecified part of right bronchus or lung: Secondary | ICD-10-CM

## 2016-03-06 DIAGNOSIS — M25552 Pain in left hip: Secondary | ICD-10-CM

## 2016-03-06 DIAGNOSIS — Z95828 Presence of other vascular implants and grafts: Secondary | ICD-10-CM

## 2016-03-06 DIAGNOSIS — C7931 Secondary malignant neoplasm of brain: Secondary | ICD-10-CM

## 2016-03-06 DIAGNOSIS — M898X9 Other specified disorders of bone, unspecified site: Secondary | ICD-10-CM

## 2016-03-06 DIAGNOSIS — Z5112 Encounter for antineoplastic immunotherapy: Secondary | ICD-10-CM

## 2016-03-06 DIAGNOSIS — C7951 Secondary malignant neoplasm of bone: Secondary | ICD-10-CM

## 2016-03-06 LAB — CBC WITH DIFFERENTIAL/PLATELET
BASO%: 0.8 % (ref 0.0–2.0)
BASOS ABS: 0 10*3/uL (ref 0.0–0.1)
EOS%: 1 % (ref 0.0–7.0)
Eosinophils Absolute: 0 10*3/uL (ref 0.0–0.5)
HEMATOCRIT: 38.4 % (ref 34.8–46.6)
HGB: 12.6 g/dL (ref 11.6–15.9)
LYMPH%: 21.2 % (ref 14.0–49.7)
MCH: 28.9 pg (ref 25.1–34.0)
MCHC: 32.7 g/dL (ref 31.5–36.0)
MCV: 88.3 fL (ref 79.5–101.0)
MONO#: 0.1 10*3/uL (ref 0.1–0.9)
MONO%: 4.7 % (ref 0.0–14.0)
NEUT#: 1.2 10*3/uL — ABNORMAL LOW (ref 1.5–6.5)
NEUT%: 72.3 % (ref 38.4–76.8)
Platelets: 341 10*3/uL (ref 145–400)
RBC: 4.36 10*6/uL (ref 3.70–5.45)
RDW: 16.5 % — ABNORMAL HIGH (ref 11.2–14.5)
WBC: 1.6 10*3/uL — ABNORMAL LOW (ref 3.9–10.3)
lymph#: 0.3 10*3/uL — ABNORMAL LOW (ref 0.9–3.3)

## 2016-03-06 LAB — COMPREHENSIVE METABOLIC PANEL
ALT: 21 U/L (ref 0–55)
AST: 15 U/L (ref 5–34)
Albumin: 3.2 g/dL — ABNORMAL LOW (ref 3.5–5.0)
Alkaline Phosphatase: 139 U/L (ref 40–150)
Anion Gap: 12 mEq/L — ABNORMAL HIGH (ref 3–11)
BUN: 5.6 mg/dL — AB (ref 7.0–26.0)
CALCIUM: 8.9 mg/dL (ref 8.4–10.4)
CHLORIDE: 105 meq/L (ref 98–109)
CO2: 17 meq/L — AB (ref 22–29)
Creatinine: 0.7 mg/dL (ref 0.6–1.1)
EGFR: >90 mL/min/{1.73_m2} (ref >90)
Glucose: 150 mg/dl — ABNORMAL HIGH (ref 70–140)
Potassium: 3.3 mEq/L — ABNORMAL LOW (ref 3.5–5.1)
Sodium: 135 mEq/L — ABNORMAL LOW (ref 136–145)
Total Bilirubin: 0.65 mg/dL (ref 0.20–1.20)
Total Protein: 8.3 g/dL (ref 6.4–8.3)

## 2016-03-06 MED ORDER — AMOXICILLIN-POT CLAVULANATE 875-125 MG PO TABS: 1.0000 | ORAL_TABLET | Freq: Two times a day (BID) | ORAL | 0 refills | Status: DC

## 2016-03-06 MED ORDER — HYDROMORPHONE HCL 4 MG/ML IJ SOLN
INTRAMUSCULAR | Status: AC
  Filled 2016-03-06: qty 1

## 2016-03-06 MED ORDER — FENTANYL 50 MCG/HR TD PT72: 50.0000 ug | MEDICATED_PATCH | TRANSDERMAL | 0 refills | Status: DC

## 2016-03-06 MED ORDER — SODIUM CHLORIDE 0.9 % IJ SOLN
10.0000 mL | INTRAMUSCULAR | Status: DC | PRN
  Administered 2016-03-06: 10 mL via INTRAVENOUS
  Filled 2016-03-06: qty 10

## 2016-03-06 MED ORDER — HYDROMORPHONE HCL 4 MG/ML IJ SOLN
2.0000 mg | INTRAMUSCULAR | Status: AC
  Administered 2016-03-06: 2 mg via INTRAMUSCULAR

## 2016-03-06 MED ORDER — HEPARIN SOD (PORK) LOCK FLUSH 100 UNIT/ML IV SOLN
500.0000 [IU] | Freq: Once | INTRAVENOUS | Status: AC | PRN
  Administered 2016-03-06: 500 [IU] via INTRAVENOUS
  Filled 2016-03-06: qty 5

## 2016-03-06 MED FILL — AMOX-CLAV 875-125 MG TABLET: 875-125 | 7 days supply | Qty: 14 | Fill #0

## 2016-03-06 MED FILL — fentaNYL 50 MCG/HR PT72: 50 | 30 days supply | Qty: 10 | Fill #0

## 2016-03-06 NOTE — Progress Notes (Signed)
[facility] [phone]   [phone]) [phone]  OFFICE PROGRESS NOTE  [name], MD [place] [address]  DIAGNOSIS: Stage IV (T1a, N2, M1b) non-small cell lung cancer, adenocarcinoma with negative EGFR mutation diagnosed in August 2016 and presented with right hilar and subcarinal lymphadenopathy as well as large soft tissue mass in the left posterior iliac bone.  PRIOR THERAPY:  1) Status post palliative radiotherapy to the soft tissue mass in the left iliac bone under the care of Dr. Valere Dross. 2) Systemic chemotherapy with carboplatin for AUC of 5 and Alimta 500 MG/M2 every 3 weeks. First dose expected on 02/22/2015. Status post 6 cycles. Starting from cycle #3 Alimta was reduced to 400 MG/M2 secondary to neutropenia after the first 2 cycles. 3) Maintenance systemic chemotherapy with single agent Alimta 500 MG/M2 every 3 weeks. First dose 07/19/2015. Status post 3 cycles. Last dose was given 08/30/2015 discontinued secondary to disease progression. 4)  Immunotherapy treatment with Tecentriq (Atezolizumab) 1200 MG IV every 3 weeks, for his dose 10/14/2015. Status post 6 cycles, discontinued secondary to disease progression. 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 First dose 03/01/2016.   INTERVAL HISTORY: Andrea [name] 45 y.o. female returns to the clinic today for follow-up visit. The patient continues to have moderate to severe pain in the left hip area similar to before her diagnosis with lung cancer. She is currently on fentanyl past 25 g/hour every 3 days in addition to oxycodone for breakthrough pain. She is also complaining of nosebleed and congestion. Her family member was recently diagnosed with pneumonia. She denied having any fever or chills. She was started on systemic  chemotherapy with docetaxel and Cyramza last week and tolerated the first cycle of her treatment well. She denied having any significant chest pain, shortness of breath, cough or hemoptysis. She had repeat CT scan of the chest, abdomen and pelvis performed recently and she is here for evaluation and discussion of her scan results.  MEDICAL HISTORY: Past Medical History:  Diagnosis Date  . 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 Anginal pain ([facility])    chest pain not sure what related to  . Anxiety   . Chronic back pain   . Depression   . Dysmenorrhea 12/06/2009   Qualifier: Diagnosis of  By: [name] RN, Regino Schultze    . Encounter for antineoplastic immunotherapy 10/07/2015  . Family history of adverse reaction to anesthesia    mother stopped breathing after surgery  . Headache    stress  . Low back pain radiating to left leg 11/29/2010  . Non-small cell carcinoma of right lung, stage 4 ([place]) 01/01/2015  . S/P radiation therapy 01/01/2015 through 01/21/2015     Left iliac bone 3500 cGy in 14 sessions   . Shortness of breath dyspnea    with exertion, Pt denies 02/2015    ALLERGIES:  is allergic to morphine and related and hydrocodone.  MEDICATIONS:  Current Outpatient Prescriptions  Medication Sig Dispense Refill  . dexamethasone (DECADRON) 4 MG tablet Take 1 tablet (4 mg total) by mouth 2 (two) times daily. 40 tablet 1  . dexamethasone (DECADRON) 4 MG tablet 2 tablet by mouth twice a day the day before, day of and day after the chemotherapy every 3 weeks 60 tablet 1  . fentaNYL (DURAGESIC - DOSED  MCG/HR) 25 MCG/HR patch Place 1 patch (25 mcg total) onto the skin every 3 (three) days. 10 patch 0  . lidocaine-prilocaine (EMLA) cream Apply 1 teaspoon to skin 1 to 2 hours before use cover to keep in contact with skin 30 g PRN  . loratadine (CLARITIN) 10 MG tablet Take 1 tablet (10 mg total) by mouth daily.    . Multiple  Vitamins-Minerals (MULTIVITAMIN & MINERAL PO) Take 2 tablets by mouth daily. Reported on 11/01/2015    . oxyCODONE-acetaminophen (PERCOCET) 10-325 MG tablet Take 1 tablet by mouth every 4 (four) hours as needed for pain. 45 tablet 0  . prochlorperazine (COMPAZINE) 10 MG tablet Take 1 tablet (10 mg total) by mouth every 6 (six) hours as needed for nausea or vomiting. 60 tablet 0  . senna-docusate (SENOKOT-S) 8.6-50 MG per tablet Take 2 tablets by mouth at bedtime. 30 tablet 0  . levETIRAcetam (KEPPRA) 1000 MG tablet Take 1 tablet (1,000 mg total) by mouth 2 (two) times daily. 60 tablet 0   No current facility-administered medications for this visit.     SURGICAL HISTORY:  Past Surgical History:  Procedure Laterality Date  . laproscopy surgery to check her tubes    . LUMBAR LAMINECTOMY/DECOMPRESSION MICRODISCECTOMY Left 05/01/2014   Procedure: Microdiscectomy - L5-S1 - left;  Surgeon: Eustace Moore, MD;  Location: [facility];  Service: Neurosurgery;  Laterality: Left;    REVIEW OF SYSTEMS:  Constitutional: positive for anorexia and fatigue Eyes: negative Ears, nose, mouth, throat, and face: positive for epistaxis and nasal congestion Respiratory: negative Cardiovascular: negative Gastrointestinal: negative Genitourinary:negative Integument/breast: negative Hematologic/lymphatic: negative Musculoskeletal:positive for back pain and bone pain Neurological: negative Behavioral/Psych: negative Endocrine: negative Allergic/Immunologic: negative   PHYSICAL EXAMINATION: General appearance: alert, cooperative, fatigued and no distress Head: Normocephalic, without obvious abnormality, atraumatic Neck: no adenopathy, no JVD, supple, symmetrical, trachea midline and thyroid not enlarged, symmetric, no tenderness/mass/nodules Lymph nodes: Cervical, supraclavicular, and axillary nodes normal. Resp: clear to auscultation bilaterally Back: symmetric, no curvature. ROM normal. No CVA tenderness. Cardio:  regular rate and rhythm, S1, S2 normal, no murmur, click, rub or gallop GI: soft, non-tender; bowel sounds normal; no masses,  no organomegaly Extremities: extremities normal, atraumatic, no cyanosis or edema Neurologic: Alert and oriented X 3, normal strength and tone. Normal symmetric reflexes. Normal coordination and gait  ECOG PERFORMANCE STATUS: 1 - Symptomatic but completely ambulatory  Blood pressure 116/78, pulse (!) 115, temperature 97.8 F (36.6 C), temperature source Oral, resp. rate 17, height '5\' 6"'$  (1.676 m), SpO2 100 %.  LABORATORY DATA: Lab Results  Component Value Date   WBC 1.6 (L) 03/06/2016   HGB 12.6 03/06/2016   HCT 38.4 03/06/2016   MCV 88.3 03/06/2016   PLT 341 03/06/2016      Chemistry      Component Value Date/Time   NA 137 03/01/2016 0908   K 3.5 03/01/2016 0908   CL 111 03/08/2015 1100   CO2 17 (L) 03/01/2016 0908   BUN 8.9 03/01/2016 0908   CREATININE 0.8 03/01/2016 0908      Component Value Date/Time   CALCIUM 9.8 03/01/2016 0908   ALKPHOS 146 03/01/2016 0908   AST 17 03/01/2016 0908   ALT 18 03/01/2016 0908   BILITOT 0.31 03/01/2016 0908       RADIOGRAPHIC STUDIES: Ct Chest W Contrast  Result Date: 03/02/2016 CLINICAL DATA:  Stage IV non-small cell lung cancer.Stage IV (T1a, N2, M1b) non-small cell lung cancer, adenocarcinoma with negative EGFR mutation diagnosed in August  2016 and presented with right hilar and subcarinal lymphadenopathy as well as large soft tissue mass in the left posterior iliac bone. Whole-brain radiation for metastasis. EXAM: CT CHEST, ABDOMEN, AND PELVIS WITH CONTRAST TECHNIQUE: Multidetector CT imaging of the chest, abdomen and pelvis was performed following the standard protocol during bolus administration of intravenous contrast. CONTRAST:  156m ISOVUE-300 IOPAMIDOL (ISOVUE-300) INJECTION 61% COMPARISON:  12/24/2015 FINDINGS: CT CHEST FINDINGS Cardiovascular: No pericardial fluid. Mediastinum/Nodes: No axillary or  supraclavicular adenopathy. No mediastinal adenopathy. No hilar adenopathy. Lungs/Pleura: Nodule just inferior to the RIGHT hilum measures 20 mm (image 72, series 4) compared with 20 mm on prior. Linear atelectasis in the RIGHT middle lobe is unchanged. Several scattered small calcified pulmonary nodules noted. Musculoskeletal: No aggressive osseous lesion. CT ABDOMEN AND PELVIS FINDINGS Hepatobiliary: Tiny 452mhypodense lesion in the RIGHT hepatic lobe (image 42, series 2) is stable over several comparison exams. No new hepatic lesion. Normal gallbladder Pancreas: Pancreas is normal. No ductal dilatation. No pancreatic inflammation. Spleen: Normal spleen Adrenals/urinary tract: Adrenal gland metastasis again noted. LEFT adrenal gland measures 4.0 x 2.3 cm compared to 4.1 x 2 .1 cm. RIGHT gland measures 4.7 x 1.5 compared with 3.4 x 1.5 remeasured. Visually lesions are not significantly changed. No enhancing renal lesion. Simple cyst of the RIGHT kidney ureters and bladder normal. Stomach/Bowel: Stomach, small bowel, appendix, and cecum are normal. The colon and rectosigmoid colon are normal. Vascular/Lymphatic: Abdominal aorta is normal caliber with atherosclerotic calcification. There is no retroperitoneal or periportal lymphadenopathy. No pelvic lymphadenopathy. Reproductive: Uterus and ovaries normal. Other: No peritoneal nodularity.  No omental nodularity Musculoskeletal: Mixed lytic and sclerotic lesion the posterior LEFT iliac bone is not changed. No new lesions identified. IMPRESSION: Chest Impression: 1. Stable perihilar nodule RIGHT lower lobe. 2. No evidence of metastatic progression in thorax. Abdomen / Pelvis Impression: 1. Similar bilateral adrenal metastasis. RIGHT adrenal gland measures slightly larger. 2. No new metastatic disease identified the abdomen or pelvis. 3. Stable exophytic sclerotic metastatic lesion in the LEFT iliac bone. Electronically Signed   By: StSuzy Bouchard.D.   On:  03/02/2016 11:39   Ct Abdomen Pelvis W Contrast  Result Date: 03/02/2016 CLINICAL DATA:  Stage IV non-small cell lung cancer.Stage IV (T1a, N2, M1b) non-small cell lung cancer, adenocarcinoma with negative EGFR mutation diagnosed in August 2016 and presented with right hilar and subcarinal lymphadenopathy as well as large soft tissue mass in the left posterior iliac bone. Whole-brain radiation for metastasis. EXAM: CT CHEST, ABDOMEN, AND PELVIS WITH CONTRAST TECHNIQUE: Multidetector CT imaging of the chest, abdomen and pelvis was performed following the standard protocol during bolus administration of intravenous contrast. CONTRAST:  10023mSOVUE-300 IOPAMIDOL (ISOVUE-300) INJECTION 61% COMPARISON:  12/24/2015 FINDINGS: CT CHEST FINDINGS Cardiovascular: No pericardial fluid. Mediastinum/Nodes: No axillary or supraclavicular adenopathy. No mediastinal adenopathy. No hilar adenopathy. Lungs/Pleura: Nodule just inferior to the RIGHT hilum measures 20 mm (image 72, series 4) compared with 20 mm on prior. Linear atelectasis in the RIGHT middle lobe is unchanged. Several scattered small calcified pulmonary nodules noted. Musculoskeletal: No aggressive osseous lesion. CT ABDOMEN AND PELVIS FINDINGS Hepatobiliary: Tiny 4mm41mpodense lesion in the RIGHT hepatic lobe (image 42, series 2) is stable over several comparison exams. No new hepatic lesion. Normal gallbladder Pancreas: Pancreas is normal. No ductal dilatation. No pancreatic inflammation. Spleen: Normal spleen Adrenals/urinary tract: Adrenal gland metastasis again noted. LEFT adrenal gland measures 4.0 x 2.3 cm compared to 4.1 x 2 .1 cm. RIGHT gland measures 4.7 x 1.5  compared with 3.4 x 1.5 remeasured. Visually lesions are not significantly changed. No enhancing renal lesion. Simple cyst of the RIGHT kidney ureters and bladder normal. Stomach/Bowel: Stomach, small bowel, appendix, and cecum are normal. The colon and rectosigmoid colon are normal.  Vascular/Lymphatic: Abdominal aorta is normal caliber with atherosclerotic calcification. There is no retroperitoneal or periportal lymphadenopathy. No pelvic lymphadenopathy. Reproductive: Uterus and ovaries normal. Other: No peritoneal nodularity.  No omental nodularity Musculoskeletal: Mixed lytic and sclerotic lesion the posterior LEFT iliac bone is not changed. No new lesions identified. IMPRESSION: Chest Impression: 1. Stable perihilar nodule RIGHT lower lobe. 2. No evidence of metastatic progression in thorax. Abdomen / Pelvis Impression: 1. Similar bilateral adrenal metastasis. RIGHT adrenal gland measures slightly larger. 2. No new metastatic disease identified the abdomen or pelvis. 3. Stable exophytic sclerotic metastatic lesion in the LEFT iliac bone. Electronically Signed   By: Suzy Bouchard M.D.   On: 03/02/2016 11:39    ASSESSMENT AND PLAN: This is a very pleasant 45 years old African-American female recently diagnosed with metastatic non-small cell lung cancer, adenocarcinoma with negative EGFR mutation status post palliative radiotherapy to the left iliac bone and soft tissue mass. She is currently undergoing systemic chemotherapy with carboplatin and Alimta status post 6 cycles.  She is currently undergoing maintenance chemotherapy with single agent Alimta status post 3 cycles. She tolerated the first 3 cycles well except for mild nausea. The restaging CT scan of the chest, abdomen and pelvis showed stable disease in the chest but there was development of left adrenal nodule concerning for possibility of adrenal metastasis. There was also mild increase in the size of the right lower lobe and from a hilar nodule. The recent PET scan confirms evidence of disease progression with new bilateral adrenal metastases in addition to hypermetabolic right paratracheal and right common iliac nodal metastasis. She was started on second line treatment with Tecentriq Huey Bienenstock) status post 6  cycles. She tolerated the last cycle of her treatment well with no significant adverse effects. Unfortunately the patient was found to have new metastatic brain lesions and her treatment was discontinued with Gildardo Pounds Huey Bienenstock). She completed whole brain irradiation for multiple metastatic brain lesions. She is currently on treatment with docetaxel and Cyramza status post one cycle. She tolerated the first week of her treatment well. I recommended for her to continue her treatment and she is scheduled to start cycle #2 on 03/22/2016. For the left hip pain, I will order MRI of the left hip for further evaluation of this area. I recommended for the patient to continue with the fentanyl patch every 3 days but I will increase her dose to 50 g/hour in addition to Percocet for breakthrough pain. She was also given a dose of Dilaudid IM and the clinic today. For the questionable sinusitis infection cancer throat, I will start the patient on Augmentin 875 mg by mouth twice a day for 7 days. The patient would come back for follow-up visit in 2 weeks for evaluation with the start of cycle #2 of her chemotherapy. She will come back for follow-up visit in 3 weeks for evaluation and management of any adverse effect of her treatment.  She was advised to call immediately if she has any concerning symptoms in the interval. The patient voices understanding of current disease status and treatment options and is in agreement with the current care plan.  All questions were answered. The patient knows to call the clinic with any problems, questions or concerns. We can  certainly see the patient much sooner if necessary.  Disclaimer: This note was dictated with voice recognition software. Similar sounding words can inadvertently be transcribed and may not be corrected upon review.       

## 2016-03-06 NOTE — Telephone Encounter (Signed)
I told pharmacist that Per Andrea [name] pt will use her 25 mcg patches( 2 )  first then the 50 mcg patch when the 25 mcg patch are used.

## 2016-03-08 ENCOUNTER — Other Ambulatory Visit: Payer: Self-pay

## 2016-03-08 NOTE — Progress Notes (Unsigned)
Counselor called patient as follow-up and to offer continued emotional support. Patient indicated that she feels a "peace of mind" since moving back into her apartment, noting that she had felt uncomfortable being at her mother's home. She described the level of support she receives from various family members and friends that makes her better able to live on her own.Counselor affirmed patient's efforts to find peace and to expend her energy on taking care of herself.  Lamount Cohen, Counseling Intern Department for [employer] and [facility] [address] [place], [place]

## 2016-03-13 ENCOUNTER — Telehealth: Payer: Self-pay

## 2016-03-13 NOTE — Telephone Encounter (Signed)
Please call pt back.

## 2016-03-13 NOTE — Telephone Encounter (Signed)
Rtc, told pt happy thanksgiving

## 2016-03-15 ENCOUNTER — Ambulatory Visit (HOSPITAL_BASED_OUTPATIENT_CLINIC_OR_DEPARTMENT_OTHER): Payer: Medicaid Other

## 2016-03-15 ENCOUNTER — Other Ambulatory Visit (HOSPITAL_BASED_OUTPATIENT_CLINIC_OR_DEPARTMENT_OTHER): Payer: Medicaid Other

## 2016-03-15 ENCOUNTER — Ambulatory Visit (HOSPITAL_COMMUNITY)
Admission: RE | Admit: 2016-03-15 | Discharge: 2016-03-15 | Disposition: A | Discharge status: Home / Self Care | Payer: Medicaid Other | Source: Ambulatory Visit | Attending: Internal Medicine | Admitting: Internal Medicine

## 2016-03-15 ENCOUNTER — Other Ambulatory Visit: Payer: Self-pay | Admitting: Internal Medicine

## 2016-03-15 ENCOUNTER — Other Ambulatory Visit: Payer: Self-pay | Admitting: Medical Oncology

## 2016-03-15 VITALS — BP 96/61 | HR 95 | Temp 97.0°F | Resp 16

## 2016-03-15 DIAGNOSIS — M898X9 Other specified disorders of bone, unspecified site: Secondary | ICD-10-CM

## 2016-03-15 DIAGNOSIS — M25552 Pain in left hip: Secondary | ICD-10-CM | POA: Diagnosis present

## 2016-03-15 DIAGNOSIS — C3491 Malignant neoplasm of unspecified part of right bronchus or lung: Secondary | ICD-10-CM

## 2016-03-15 DIAGNOSIS — C7951 Secondary malignant neoplasm of bone: Secondary | ICD-10-CM | POA: Diagnosis not present

## 2016-03-15 DIAGNOSIS — C7931 Secondary malignant neoplasm of brain: Secondary | ICD-10-CM

## 2016-03-15 DIAGNOSIS — E876 Hypokalemia: Secondary | ICD-10-CM

## 2016-03-15 DIAGNOSIS — Z95828 Presence of other vascular implants and grafts: Secondary | ICD-10-CM

## 2016-03-15 LAB — CBC WITH DIFFERENTIAL/PLATELET
BASO%: 0 % (ref 0.0–2.0)
BASOS ABS: 0 10*3/uL (ref 0.0–0.1)
EOS ABS: 0 10*3/uL (ref 0.0–0.5)
EOS%: 0 % (ref 0.0–7.0)
HCT: 33.6 % — ABNORMAL LOW (ref 34.8–46.6)
HGB: 11.3 g/dL — ABNORMAL LOW (ref 11.6–15.9)
LYMPH%: 21.8 % (ref 14.0–49.7)
MCH: 28.6 pg (ref 25.1–34.0)
MCHC: 33.6 g/dL (ref 31.5–36.0)
MCV: 85.1 fL (ref 79.5–101.0)
MONO#: 0.6 10*3/uL (ref 0.1–0.9)
MONO%: 13.5 % (ref 0.0–14.0)
NEUT#: 3 10*3/uL (ref 1.5–6.5)
NEUT%: 64.7 % (ref 38.4–76.8)
Platelets: 219 10*3/uL (ref 145–400)
RBC: 3.95 10*6/uL (ref 3.70–5.45)
RDW: 16.8 % — ABNORMAL HIGH (ref 11.2–14.5)
WBC: 4.6 10*3/uL (ref 3.9–10.3)
lymph#: 1 10*3/uL (ref 0.9–3.3)

## 2016-03-15 LAB — COMPREHENSIVE METABOLIC PANEL
ALBUMIN: 2.6 g/dL — AB (ref 3.5–5.0)
ALK PHOS: 131 U/L (ref 40–150)
ALT: 19 U/L (ref 0–55)
AST: 21 U/L (ref 5–34)
Anion Gap: 12 mEq/L — ABNORMAL HIGH (ref 3–11)
BUN: <4 mg/dL — ABNORMAL LOW (ref 7.0–26.0)
CO2: 19 meq/L — AB (ref 22–29)
Calcium: 8 mg/dL — ABNORMAL LOW (ref 8.4–10.4)
Chloride: 103 mEq/L (ref 98–109)
Creatinine: 0.6 mg/dL (ref 0.6–1.1)
GLUCOSE: 107 mg/dL (ref 70–140)
POTASSIUM: 2.6 meq/L — AB (ref 3.5–5.1)
SODIUM: 135 meq/L — AB (ref 136–145)
Total Bilirubin: 0.42 mg/dL (ref 0.20–1.20)
Total Protein: 7 g/dL (ref 6.4–8.3)

## 2016-03-15 MED ORDER — DENOSUMAB 120 MG/1.7ML ~~LOC~~ SOLN
120.0000 mg | Freq: Once | SUBCUTANEOUS | Status: AC
  Administered 2016-03-15: 120 mg via SUBCUTANEOUS
  Filled 2016-03-15: qty 1.7

## 2016-03-15 MED ORDER — SODIUM CHLORIDE 0.9 % IJ SOLN
10.0000 mL | INTRAMUSCULAR | Status: DC | PRN
  Administered 2016-03-15: 10 mL via INTRAVENOUS
  Filled 2016-03-15: qty 10

## 2016-03-15 MED ORDER — SODIUM CHLORIDE 0.9 % IV SOLN
40.0000 meq | Freq: Once | INTRAVENOUS | Status: AC
  Administered 2016-03-15: 40 meq via INTRAVENOUS
  Filled 2016-03-15: qty 20

## 2016-03-15 MED ORDER — GADOBENATE DIMEGLUMINE 529 MG/ML IV SOLN
15.0000 mL | Freq: Once | INTRAVENOUS | Status: AC | PRN
  Administered 2016-03-15: 15 mL via INTRAVENOUS

## 2016-03-15 MED ORDER — POTASSIUM CHLORIDE CRYS ER 20 MEQ PO TBCR
20.0000 meq | EXTENDED_RELEASE_TABLET | Freq: Every day | ORAL | 0 refills | Status: DC
Start: 2016-03-15 — End: 2016-03-22

## 2016-03-15 MED ORDER — HEPARIN SOD (PORK) LOCK FLUSH 100 UNIT/ML IV SOLN
500.0000 [IU] | Freq: Once | INTRAVENOUS | Status: AC | PRN
  Administered 2016-03-15: 500 [IU] via INTRAVENOUS
  Filled 2016-03-15: qty 5

## 2016-03-15 MED FILL — KLOR-CON M20 TABLET: 20 | 7 days supply | Qty: 7 | Fill #0

## 2016-03-15 NOTE — Patient Instructions (Addendum)
Personal Notes:  Take Imodium as directed for diarrhea.  See PCP for vaginal burning and pain; can try over the counter Monistat or Lotrimin cream topically.   Pick up potassium prescription and take it as directed.    Hypokalemia Hypokalemia means that the amount of potassium in the blood is lower than normal.Potassium is a chemical that helps regulate the amount of fluid in the body (electrolyte). It also stimulates muscle tightening (contraction) and helps nerves work properly.Normally, most of the body's potassium is inside of cells, and only a very small amount is in the blood. Because the amount in the blood is so small, minor changes to potassium levels in the blood can be life-threatening. What are the causes? This condition may be caused by:  Antibiotic medicine.  Diarrhea or vomiting. Taking too much of a medicine that helps you have a bowel movement (laxative) can cause diarrhea and lead to hypokalemia.  Chronic kidney disease (CKD).  Medicines that help the body get rid of excess fluid (diuretics).  Eating disorders, such as bulimia.  Low magnesium levels in the body.  Sweating a lot. What are the signs or symptoms? Symptoms of this condition include:  Weakness.  Constipation.  Fatigue.  Muscle cramps.  Mental confusion.  Skipped heartbeats or irregular heartbeat (palpitations).  Tingling or numbness. How is this diagnosed? This condition is diagnosed with a blood test. How is this treated? Hypokalemia can be treated by taking potassium supplements by mouth or adjusting the medicines that you take. Treatment may also include eating more foods that contain a lot of potassium. If your potassium level is very low, you may need to get potassium through an IV tube in one of your veins and be monitored in the hospital. Follow these instructions at home:  Take over-the-counter and prescription medicines only as told by your health care provider. This includes  vitamins and supplements.  Eat a healthy diet. A healthy diet includes fresh fruits and vegetables, whole grains, healthy fats, and lean proteins.  If instructed, eat more foods that contain a lot of potassium, such as:  Nuts, such as peanuts and pistachios.  Seeds, such as sunflower seeds and pumpkin seeds.  Peas, lentils, and lima beans.  Whole grain and bran cereals and breads.  Fresh fruits and vegetables, such as apricots, avocado, bananas, cantaloupe, kiwi, oranges, tomatoes, asparagus, and potatoes.  Orange juice.  Tomato juice.  Red meats.  Yogurt.  Keep all follow-up visits as told by your health care provider. This is important. Contact a health care provider if:  You have weakness that gets worse.  You feel your heart pounding or racing.  You vomit.  You have diarrhea.  You have diabetes (diabetes mellitus) and you have trouble keeping your blood sugar (glucose) in your target range. Get help right away if:  You have chest pain.  You have shortness of breath.  You have vomiting or diarrhea that lasts for more than 2 days.  You faint. This information is not intended to replace advice given to you by your health care provider. Make sure you discuss any questions you have with your health care provider. Document Released: 04/10/2005 Document Revised: 11/27/2015 Document Reviewed: 11/27/2015 Elsevier Interactive Patient Education  2017 [employer].

## 2016-03-15 NOTE — Progress Notes (Signed)
Counseling intern met with patient as she waited to have an MRI. Patient appeared alert and oriented yet subdued and spoke clearly with counselor throughout session.  Patient noted that she continues to live alone in her own apartment and finds this much more agreeable than living with her mother. Patient stated that she has not had contact with her mother since she moved back home and expressed frustration and sadness that she and her mother are not able to have a better relationship. She stated that she wishes her mother would accept that her condition has gotten worse and try to be more supportive.Patient stated that she fears her mother would be deeply troubled if patient were to die without the two of them making amends, but patient stated that it has become too much for her to fight for both her health and the relationship.   Lamount Cohen, Counseling Intern Department for [facility] and [facility] Supervisor - Scientist, research (physical sciences)

## 2016-03-17 ENCOUNTER — Telehealth: Payer: Self-pay | Admitting: Internal Medicine

## 2016-03-17 NOTE — Telephone Encounter (Signed)
Spoke with patient re 11/29 appointment - patient to get new schedule 11/29.

## 2016-03-20 ENCOUNTER — Telehealth: Payer: Self-pay

## 2016-03-20 NOTE — Telephone Encounter (Signed)
Rtc, asking about lotion

## 2016-03-20 NOTE — Telephone Encounter (Signed)
Requesting to speak with Helen.  

## 2016-03-21 ENCOUNTER — Ambulatory Visit: Payer: Self-pay

## 2016-03-21 NOTE — Telephone Encounter (Signed)
assisted

## 2016-03-22 ENCOUNTER — Ambulatory Visit: Payer: Medicaid Other

## 2016-03-22 ENCOUNTER — Ambulatory Visit (HOSPITAL_BASED_OUTPATIENT_CLINIC_OR_DEPARTMENT_OTHER): Payer: Medicaid Other | Admitting: Internal Medicine

## 2016-03-22 ENCOUNTER — Ambulatory Visit (HOSPITAL_BASED_OUTPATIENT_CLINIC_OR_DEPARTMENT_OTHER): Payer: Medicaid Other

## 2016-03-22 ENCOUNTER — Ambulatory Visit: Payer: Self-pay

## 2016-03-22 ENCOUNTER — Encounter: Payer: Self-pay | Admitting: Internal Medicine

## 2016-03-22 ENCOUNTER — Other Ambulatory Visit: Payer: Self-pay | Admitting: Medical Oncology

## 2016-03-22 ENCOUNTER — Other Ambulatory Visit: Payer: Self-pay | Admitting: Internal Medicine

## 2016-03-22 ENCOUNTER — Other Ambulatory Visit (HOSPITAL_BASED_OUTPATIENT_CLINIC_OR_DEPARTMENT_OTHER): Payer: Medicaid Other

## 2016-03-22 VITALS — BP 102/65 | HR 93 | Temp 93.0°F | Resp 17 | Wt 151.6 lb

## 2016-03-22 DIAGNOSIS — M898X9 Other specified disorders of bone, unspecified site: Secondary | ICD-10-CM

## 2016-03-22 DIAGNOSIS — Z5111 Encounter for antineoplastic chemotherapy: Secondary | ICD-10-CM | POA: Diagnosis not present

## 2016-03-22 DIAGNOSIS — C3491 Malignant neoplasm of unspecified part of right bronchus or lung: Secondary | ICD-10-CM

## 2016-03-22 DIAGNOSIS — Z5112 Encounter for antineoplastic immunotherapy: Secondary | ICD-10-CM

## 2016-03-22 DIAGNOSIS — C7931 Secondary malignant neoplasm of brain: Secondary | ICD-10-CM | POA: Diagnosis not present

## 2016-03-22 DIAGNOSIS — Z95828 Presence of other vascular implants and grafts: Secondary | ICD-10-CM

## 2016-03-22 DIAGNOSIS — C7951 Secondary malignant neoplasm of bone: Secondary | ICD-10-CM

## 2016-03-22 DIAGNOSIS — E876 Hypokalemia: Secondary | ICD-10-CM

## 2016-03-22 DIAGNOSIS — G893 Neoplasm related pain (acute) (chronic): Secondary | ICD-10-CM | POA: Diagnosis not present

## 2016-03-22 DIAGNOSIS — Z79899 Other long term (current) drug therapy: Secondary | ICD-10-CM | POA: Diagnosis not present

## 2016-03-22 LAB — COMPREHENSIVE METABOLIC PANEL
ALBUMIN: 2.9 g/dL — AB (ref 3.5–5.0)
ALK PHOS: 125 U/L (ref 40–150)
ALT: 9 U/L (ref 0–55)
AST: 16 U/L (ref 5–34)
Anion Gap: 11 mEq/L (ref 3–11)
CALCIUM: 8.4 mg/dL (ref 8.4–10.4)
CO2: 19 mEq/L — ABNORMAL LOW (ref 22–29)
CREATININE: 0.6 mg/dL (ref 0.6–1.1)
Chloride: 108 mEq/L (ref 98–109)
EGFR: >90 mL/min/{1.73_m2} (ref >90)
Glucose: 110 mg/dl (ref 70–140)
POTASSIUM: 3.1 meq/L — AB (ref 3.5–5.1)
Sodium: 138 mEq/L (ref 136–145)
Total Bilirubin: 0.46 mg/dL (ref 0.20–1.20)
Total Protein: 7.4 g/dL (ref 6.4–8.3)

## 2016-03-22 LAB — CBC WITH DIFFERENTIAL/PLATELET
BASO%: 1.3 % (ref 0.0–2.0)
BASOS ABS: 0.1 10*3/uL (ref 0.0–0.1)
EOS ABS: 0 10*3/uL (ref 0.0–0.5)
EOS%: 0.1 % (ref 0.0–7.0)
HEMATOCRIT: 36.5 % (ref 34.8–46.6)
HEMOGLOBIN: 11.8 g/dL (ref 11.6–15.9)
LYMPH#: 1 10*3/uL (ref 0.9–3.3)
LYMPH%: 23.4 % (ref 14.0–49.7)
MCH: 28.4 pg (ref 25.1–34.0)
MCHC: 32.4 g/dL (ref 31.5–36.0)
MCV: 87.6 fL (ref 79.5–101.0)
MONO#: 0.5 10*3/uL (ref 0.1–0.9)
MONO%: 10.8 % (ref 0.0–14.0)
NEUT#: 2.8 10*3/uL (ref 1.5–6.5)
NEUT%: 64.4 % (ref 38.4–76.8)
Platelets: 558 10*3/uL — ABNORMAL HIGH (ref 145–400)
RBC: 4.16 10*6/uL (ref 3.70–5.45)
RDW: 19.2 % — AB (ref 11.2–14.5)
WBC: 4.3 10*3/uL (ref 3.9–10.3)

## 2016-03-22 LAB — UA PROTEIN, DIPSTICK - CHCC: PROTEIN: 30 mg/dL

## 2016-03-22 MED ORDER — DENOSUMAB 120 MG/1.7ML ~~LOC~~ SOLN: 120.0000 mg | Freq: Once | SUBCUTANEOUS | Status: DC

## 2016-03-22 MED ORDER — DIPHENHYDRAMINE HCL 50 MG/ML IJ SOLN
INTRAMUSCULAR | Status: AC
  Filled 2016-03-22: qty 1

## 2016-03-22 MED ORDER — POTASSIUM CHLORIDE CRYS ER 20 MEQ PO TBCR: 20.0000 meq | EXTENDED_RELEASE_TABLET | Freq: Every day | ORAL | 0 refills | Status: DC

## 2016-03-22 MED ORDER — ACETAMINOPHEN 325 MG PO TABS
650.0000 mg | ORAL_TABLET | Freq: Once | ORAL | Status: AC
  Administered 2016-03-22: 650 mg via ORAL

## 2016-03-22 MED ORDER — DIPHENHYDRAMINE HCL 50 MG/ML IJ SOLN
50.0000 mg | Freq: Once | INTRAMUSCULAR | Status: AC
  Administered 2016-03-22: 50 mg via INTRAVENOUS

## 2016-03-22 MED ORDER — ACETAMINOPHEN 325 MG PO TABS
ORAL_TABLET | ORAL | Status: AC
  Filled 2016-03-22: qty 2

## 2016-03-22 MED ORDER — DEXAMETHASONE SODIUM PHOSPHATE 10 MG/ML IJ SOLN
10.0000 mg | Freq: Once | INTRAMUSCULAR | Status: AC
  Administered 2016-03-22: 10 mg via INTRAVENOUS

## 2016-03-22 MED ORDER — DOCETAXEL CHEMO INJECTION 160 MG/16ML
75.0000 mg/m2 | Freq: Once | INTRAVENOUS | Status: AC
  Administered 2016-03-22: 140 mg via INTRAVENOUS
  Filled 2016-03-22: qty 14

## 2016-03-22 MED ORDER — DEXAMETHASONE SODIUM PHOSPHATE 10 MG/ML IJ SOLN
INTRAMUSCULAR | Status: AC
  Filled 2016-03-22: qty 1

## 2016-03-22 MED ORDER — PEGFILGRASTIM 6 MG/0.6ML ~~LOC~~ PSKT
6.0000 mg | PREFILLED_SYRINGE | Freq: Once | SUBCUTANEOUS | Status: AC
  Administered 2016-03-22: 6 mg via SUBCUTANEOUS
  Filled 2016-03-22: qty 0.6

## 2016-03-22 MED ORDER — OXYCODONE-ACETAMINOPHEN 10-325 MG PO TABS: 1.0000 | ORAL_TABLET | ORAL | 0 refills | Status: DC | PRN

## 2016-03-22 MED ORDER — SODIUM CHLORIDE 0.9 % IV SOLN
Freq: Once | INTRAVENOUS | Status: AC
  Administered 2016-03-22: 13:00:00 via INTRAVENOUS

## 2016-03-22 MED ORDER — SODIUM CHLORIDE 0.9 % IJ SOLN
10.0000 mL | INTRAMUSCULAR | Status: DC | PRN
  Administered 2016-03-22: 10 mL via INTRAVENOUS
  Filled 2016-03-22: qty 10

## 2016-03-22 MED ORDER — SODIUM CHLORIDE 0.9% FLUSH
10.0000 mL | INTRAVENOUS | Status: DC | PRN
  Administered 2016-03-22: 10 mL
  Filled 2016-03-22: qty 10

## 2016-03-22 MED ORDER — HEPARIN SOD (PORK) LOCK FLUSH 100 UNIT/ML IV SOLN
500.0000 [IU] | Freq: Once | INTRAVENOUS | Status: AC | PRN
  Administered 2016-03-22: 500 [IU]
  Filled 2016-03-22: qty 5

## 2016-03-22 MED ORDER — SODIUM CHLORIDE 0.9 % IV SOLN
10.0000 mg/kg | Freq: Once | INTRAVENOUS | Status: AC
  Administered 2016-03-22: 700 mg via INTRAVENOUS
  Filled 2016-03-22: qty 50

## 2016-03-22 NOTE — Telephone Encounter (Signed)
Pt states she was given samples of aquaphor ointment at cancer ctr for her scalp and face and needs more, she was assisted w/ obtaining a jar of aquaphor

## 2016-03-22 NOTE — Patient Instructions (Signed)
[facility] Discharge Instructions for Patients Receiving Chemotherapy  Today you received the following chemotherapy agents Cyramza and Taxotere To help prevent nausea and vomiting after your treatment, we encourage you to take your nausea medication as directed   If you develop nausea and vomiting that is not controlled by your nausea medication, call the clinic.   BELOW ARE SYMPTOMS THAT SHOULD BE REPORTED IMMEDIATELY:  *FEVER GREATER THAN 100.5 F  *CHILLS WITH OR WITHOUT FEVER  NAUSEA AND VOMITING THAT IS NOT CONTROLLED WITH YOUR NAUSEA MEDICATION  *UNUSUAL SHORTNESS OF BREATH  *UNUSUAL BRUISING OR BLEEDING  TENDERNESS IN MOUTH AND THROAT WITH OR WITHOUT PRESENCE OF ULCERS  *URINARY PROBLEMS  *BOWEL PROBLEMS  UNUSUAL RASH Items with * indicate a potential emergency and should be followed up as soon as possible.  Feel free to call the clinic you have any questions or concerns. The clinic phone number is [phone].  Please show the CHEMO ALERT CARD at check-in to the Emergency Department and triage nurse.   

## 2016-03-22 NOTE — Progress Notes (Signed)
Counselor met with patient during chemotherapy treatment. Despite her hip pain, patient was alert, oriented, and in good spirits. She talked about the challenges of getting to the hospital and noted she would speak to her social worker about her options.  Patient spoke again about seeking a 2nd opinion regarding her treatment options, as she learned about another cancer patient who was able to go from Stage 4 cancer to Stage 2. Patient noted that regardless of which stage she's in, she is not giving up on herself. Patient described her desire to raise enough money to be able to pay for her own funeral independent of her mother's financial support. She acknowledged that while she does feel lonely at times without her mother around, she still enjoys the peace of mind of not being around her mother's speaking harshly to her. She also spoke fondly about her father and his suggestion that she seek a 2nd opinion. Patient noted that a lot of the friends who said they would show up when she needed them have failed to honor their commitment; and while she is often disappointed by them, she tries to be forgiving and understanding of their circumstances.   Patient described her feelings when the chaplain and this counselor began seeing her, as it was an indication to her that her condition (brain lesions) was much worse than she had believed it was and that she should begin thinking about dying. She continued to express frustration about not undergoing full body scans to detect the cancer in her brain during the year she was in remission.  Kristy Schomburg A. Philbert Riser, Counseling Intern Department for Spiritual Care and [employer] Supervisor - Scientist, research (physical sciences)

## 2016-03-22 NOTE — Progress Notes (Signed)
    [facility] [phone]   [phone]  OFFICE PROGRESS NOTE  [name], [name], MD [address] [place] [address]  DIAGNOSIS: Stage IV (T1a, N2, M1b) non-small cell lung cancer, adenocarcinoma with negative EGFR mutation diagnosed in August 2016 and presented with right hilar and subcarinal lymphadenopathy as well as large soft tissue mass in the left posterior iliac bone.  PRIOR THERAPY:  1) Status post palliative radiotherapy to the soft tissue mass in the left iliac bone under the care of Dr. Murray. 2) Systemic chemotherapy with carboplatin for AUC of 5 and Alimta 500 MG/M2 every 3 weeks. First dose expected on 02/22/2015. Status post 6 cycles. Starting from cycle #3 Alimta was reduced to 400 MG/M2 secondary to neutropenia after the first 2 cycles. 3) Maintenance systemic chemotherapy with single agent Alimta 500 MG/M2 every 3 weeks. First dose 07/19/2015. Status post 3 cycles. Last dose was given 08/30/2015 discontinued secondary to disease progression. 4)  Immunotherapy treatment with Tecentriq (Atezolizumab) 1200 MG IV every 3 weeks, for his dose 10/14/2015. Status post 6 cycles, discontinued secondary to disease progression. 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 First dose 03/01/2016. She is status post 1 cycle.  INTERVAL HISTORY: Andrea [name] 45 y.o. female returns to the clinic today for follow-up visit. The patient is feeling a little bit better today but continues to have moderate pain in the left hip area similar to before her diagnosis with lung cancer. She had MRI of the left hip performed on 03/15/2016 and it showed 2.1 x 2.5 x 3.0 cm bone lesion in the posterior left ilium with surrounding edema most consistent with metastatic disease. There was poor lesion in the adjacent left  posterior sacrum measuring 2.5 x 1.5 cm and there was severe soft tissue edema with enhancement in the adjacent left multifidus muscle concerning for extension of metastatic disease. I referred the patient to Dr. Manning for consideration of palliative radiotherapy to this area. I also increased her fentanyl past to 50 g/hour every 3 days in addition to the oxycodone for breakthrough pain. She is feeling a little bit better on the new pain medication regimen. She denied having any fever or chills. She is in today for evaluation before starting cycle #2 of her treatment. She has no fever or chills, she has no nausea or vomiting. No chest pain, shortness of breath, cough or hemoptysis.  MEDICAL HISTORY: Past Medical History:  Diagnosis Date  . 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 Anginal pain (HCC)    chest pain not sure what related to  . Anxiety   . Chronic back pain   . Depression   . Dysmenorrhea 12/06/2009   Qualifier: Diagnosis of  By: Herbin RN, Gladys    . Encounter for antineoplastic immunotherapy 10/07/2015  . Family history of adverse reaction to anesthesia    mother stopped breathing after surgery  . Headache    stress  . Low back pain radiating to left leg 11/29/2010  . Non-small cell carcinoma of right lung, stage 4 (HCC) 01/01/2015  . S/P radiation therapy 01/01/2015 through 01/21/2015     Left iliac bone 3500 cGy in 14 sessions   . Shortness of breath dyspnea    with exertion, Pt denies 02/2015    ALLERGIES:  is allergic to morphine and related and   hydrocodone.  MEDICATIONS:  Current Outpatient Prescriptions  Medication Sig Dispense Refill  . dexamethasone (DECADRON) 4 MG tablet Take 1 tablet (4 mg total) by mouth 2 (two) times daily. 40 tablet 1  . dexamethasone (DECADRON) 4 MG tablet 2 tablet by mouth twice a day the day before, day of and day after the chemotherapy every 3 weeks 60 tablet 1  .  fentaNYL (DURAGESIC - DOSED MCG/HR) 50 MCG/HR Place 1 patch (50 mcg total) onto the skin every 3 (three) days. 10 patch 0  . levETIRAcetam (KEPPRA) 1000 MG tablet Take 1 tablet (1,000 mg total) by mouth 2 (two) times daily. 60 tablet 0  . lidocaine-prilocaine (EMLA) cream Apply 1 teaspoon to skin 1 to 2 hours before use cover to keep in contact with skin 30 g PRN  . loratadine (CLARITIN) 10 MG tablet Take 1 tablet (10 mg total) by mouth daily.    . Multiple Vitamins-Minerals (MULTIVITAMIN & MINERAL PO) Take 2 tablets by mouth daily. Reported on 11/01/2015    . oxyCODONE-acetaminophen (PERCOCET) 10-325 MG tablet Take 1 tablet by mouth every 4 (four) hours as needed for pain. 45 tablet 0  . potassium chloride SA (K-DUR,KLOR-CON) 20 MEQ tablet Take 1 tablet (20 mEq total) by mouth daily. 7 tablet 0  . prochlorperazine (COMPAZINE) 10 MG tablet Take 1 tablet (10 mg total) by mouth every 6 (six) hours as needed for nausea or vomiting. 60 tablet 0  . senna-docusate (SENOKOT-S) 8.6-50 MG per tablet Take 2 tablets by mouth at bedtime. 30 tablet 0   No current facility-administered medications for this visit.     SURGICAL HISTORY:  Past Surgical History:  Procedure Laterality Date  . laproscopy surgery to check her tubes    . LUMBAR LAMINECTOMY/DECOMPRESSION MICRODISCECTOMY Left 05/01/2014   Procedure: Microdiscectomy - L5-S1 - left;  Surgeon: David S Jones, MD;  Location: [facility] OR;  Service: Neurosurgery;  Laterality: Left;    REVIEW OF SYSTEMS:  Constitutional: positive for anorexia and fatigue Eyes: negative Ears, nose, mouth, throat, and face: negative Respiratory: negative Cardiovascular: negative Gastrointestinal: negative Genitourinary:negative Integument/breast: negative Hematologic/lymphatic: negative Musculoskeletal:positive for back pain and bone pain Neurological: negative Behavioral/Psych: negative Endocrine: negative Allergic/Immunologic: negative   PHYSICAL EXAMINATION: General  appearance: alert, cooperative, fatigued and no distress Head: Normocephalic, without obvious abnormality, atraumatic Neck: no adenopathy, no JVD, supple, symmetrical, trachea midline and thyroid not enlarged, symmetric, no tenderness/mass/nodules Lymph nodes: Cervical, supraclavicular, and axillary nodes normal. Resp: clear to auscultation bilaterally Back: symmetric, no curvature. ROM normal. No CVA tenderness. Cardio: regular rate and rhythm, S1, S2 normal, no murmur, click, rub or gallop GI: soft, non-tender; bowel sounds normal; no masses,  no organomegaly Extremities: extremities normal, atraumatic, no cyanosis or edema Neurologic: Alert and oriented X 3, normal strength and tone. Normal symmetric reflexes. Normal coordination and gait  ECOG PERFORMANCE STATUS: 1 - Symptomatic but completely ambulatory  Blood pressure 102/65, pulse 93, temperature (!) 93 F (33.9 C), temperature source Oral, resp. rate 17, weight 151 lb 9.6 oz (68.8 kg), SpO2 100 %. The patient is not consistent with the patient asymptomatic condition and likely machine error.  LABORATORY DATA: Lab Results  Component Value Date   WBC 4.3 03/22/2016   HGB 11.8 03/22/2016   HCT 36.5 03/22/2016   MCV 87.6 03/22/2016   PLT 558 (H) 03/22/2016      Chemistry      Component Value Date/Time   NA 138 03/22/2016 1124   K 3.1 (L) 03/22/2016 1124     CL 111 03/08/2015 1100   CO2 19 (L) 03/22/2016 1124   BUN <4.0 (L) 03/22/2016 1124   CREATININE 0.6 03/22/2016 1124      Component Value Date/Time   CALCIUM 8.4 03/22/2016 1124   ALKPHOS 125 03/22/2016 1124   AST 16 03/22/2016 1124   ALT 9 03/22/2016 1124   BILITOT 0.46 03/22/2016 1124       RADIOGRAPHIC STUDIES: Ct Chest W Contrast  Result Date: 03/02/2016 CLINICAL DATA:  Stage IV non-small cell lung cancer.Stage IV (T1a, N2, M1b) non-small cell lung cancer, adenocarcinoma with negative EGFR mutation diagnosed in August 2016 and presented with right hilar and  subcarinal lymphadenopathy as well as large soft tissue mass in the left posterior iliac bone. Whole-brain radiation for metastasis. EXAM: CT CHEST, ABDOMEN, AND PELVIS WITH CONTRAST TECHNIQUE: Multidetector CT imaging of the chest, abdomen and pelvis was performed following the standard protocol during bolus administration of intravenous contrast. CONTRAST:  100mL ISOVUE-300 IOPAMIDOL (ISOVUE-300) INJECTION 61% COMPARISON:  12/24/2015 FINDINGS: CT CHEST FINDINGS Cardiovascular: No pericardial fluid. Mediastinum/Nodes: No axillary or supraclavicular adenopathy. No mediastinal adenopathy. No hilar adenopathy. Lungs/Pleura: Nodule just inferior to the RIGHT hilum measures 20 mm (image 72, series 4) compared with 20 mm on prior. Linear atelectasis in the RIGHT middle lobe is unchanged. Several scattered small calcified pulmonary nodules noted. Musculoskeletal: No aggressive osseous lesion. CT ABDOMEN AND PELVIS FINDINGS Hepatobiliary: Tiny 4mm hypodense lesion in the RIGHT hepatic lobe (image 42, series 2) is stable over several comparison exams. No new hepatic lesion. Normal gallbladder Pancreas: Pancreas is normal. No ductal dilatation. No pancreatic inflammation. Spleen: Normal spleen Adrenals/urinary tract: Adrenal gland metastasis again noted. LEFT adrenal gland measures 4.0 x 2.3 cm compared to 4.1 x 2 .1 cm. RIGHT gland measures 4.7 x 1.5 compared with 3.4 x 1.5 remeasured. Visually lesions are not significantly changed. No enhancing renal lesion. Simple cyst of the RIGHT kidney ureters and bladder normal. Stomach/Bowel: Stomach, small bowel, appendix, and cecum are normal. The colon and rectosigmoid colon are normal. Vascular/Lymphatic: Abdominal aorta is normal caliber with atherosclerotic calcification. There is no retroperitoneal or periportal lymphadenopathy. No pelvic lymphadenopathy. Reproductive: Uterus and ovaries normal. Other: No peritoneal nodularity.  No omental nodularity Musculoskeletal: Mixed  lytic and sclerotic lesion the posterior LEFT iliac bone is not changed. No new lesions identified. IMPRESSION: Chest Impression: 1. Stable perihilar nodule RIGHT lower lobe. 2. No evidence of metastatic progression in thorax. Abdomen / Pelvis Impression: 1. Similar bilateral adrenal metastasis. RIGHT adrenal gland measures slightly larger. 2. No new metastatic disease identified the abdomen or pelvis. 3. Stable exophytic sclerotic metastatic lesion in the LEFT iliac bone. Electronically Signed   By: Stewart  Edmunds M.D.   On: 03/02/2016 11:39   Mr Hip Left W Wo Contrast  Result Date: 03/15/2016 CLINICAL DATA:  Small cell lung cancer. Known metastatic disease. Left-sided hip pain. EXAM: MRI OF THE LEFT HIP WITHOUT AND WITH CONTRAST TECHNIQUE: Multiplanar, multisequence MR imaging was performed both before and after administration of intravenous contrast. CONTRAST:  15mL MULTIHANCE GADOBENATE DIMEGLUMINE 529 MG/ML IV SOLN COMPARISON:  MR pelvis 12/14/2014 FINDINGS: Bones: No hip fracture, dislocation or avascular necrosis. No SI joint widening or erosive changes. 2.1 x 2.5 x 3 cm bone lesion in the posterior left ilium with surrounding edema most consistent with metastatic disease. Bone lesion in the adjacent left posterior sacrum measuring 2.5 x 1.5 cm. Abnormal bone lesion in the left posterior ilium which extends to the posterior margin of the left S1 foramen   with perineural enhancement concerning for left S1 perineural metastatic disease. Severe soft tissue edema with enhancement in the adjacent left multifidus muscle concerning for extension of metastatic disease. Heterogeneous marrow signal in the right sacrum and ilium likely reflect red marrow. Degenerative disc disease with disc height loss at L5-S1. Articular cartilage and labrum Articular cartilage:  No chondral defect. Labrum: Grossly intact, but evaluation is limited by lack of intraarticular fluid. Joint or bursal effusion Joint effusion:  No hip  joint effusion.  No SI joint effusion. Bursae:  No bursa formation. Muscles and tendons Flexors: Normal. Extensors: Normal. Abductors: Normal. Adductors: Normal. Rotators: Normal. Hamstrings: Normal. Other findings Miscellaneous: No fluid collection or hematoma. No inguinal hernia. No inguinal lymphadenopathy. IMPRESSION: 1. 2.1 x 2.5 x 3 cm bone lesion in the posterior left ilium with surrounding edema most consistent with metastatic disease. Bone lesion in the adjacent left posterior sacrum measuring 2.5 x 1.5 cm. Severe soft tissue edema with enhancement in the adjacent left multifidus muscle concerning for extension of metastatic disease. 2. Abnormal bone lesion in the left posterior ilium which extends to the posterior margin of the left S1 foramen with perineural enhancement concerning for left S1 perineural metastatic disease. Findings most consistent with progression of disease. Electronically Signed   By: Kathreen Devoid   On: 03/15/2016 14:45   Ct Abdomen Pelvis W Contrast  Result Date: 03/02/2016 CLINICAL DATA:  Stage IV non-small cell lung cancer.Stage IV (T1a, N2, M1b) non-small cell lung cancer, adenocarcinoma with negative EGFR mutation diagnosed in August 2016 and presented with right hilar and subcarinal lymphadenopathy as well as large soft tissue mass in the left posterior iliac bone. Whole-brain radiation for metastasis. EXAM: CT CHEST, ABDOMEN, AND PELVIS WITH CONTRAST TECHNIQUE: Multidetector CT imaging of the chest, abdomen and pelvis was performed following the standard protocol during bolus administration of intravenous contrast. CONTRAST:  159m ISOVUE-300 IOPAMIDOL (ISOVUE-300) INJECTION 61% COMPARISON:  12/24/2015 FINDINGS: CT CHEST FINDINGS Cardiovascular: No pericardial fluid. Mediastinum/Nodes: No axillary or supraclavicular adenopathy. No mediastinal adenopathy. No hilar adenopathy. Lungs/Pleura: Nodule just inferior to the RIGHT hilum measures 20 mm (image 72, series 4) compared  with 20 mm on prior. Linear atelectasis in the RIGHT middle lobe is unchanged. Several scattered small calcified pulmonary nodules noted. Musculoskeletal: No aggressive osseous lesion. CT ABDOMEN AND PELVIS FINDINGS Hepatobiliary: Tiny 423mhypodense lesion in the RIGHT hepatic lobe (image 42, series 2) is stable over several comparison exams. No new hepatic lesion. Normal gallbladder Pancreas: Pancreas is normal. No ductal dilatation. No pancreatic inflammation. Spleen: Normal spleen Adrenals/urinary tract: Adrenal gland metastasis again noted. LEFT adrenal gland measures 4.0 x 2.3 cm compared to 4.1 x 2 .1 cm. RIGHT gland measures 4.7 x 1.5 compared with 3.4 x 1.5 remeasured. Visually lesions are not significantly changed. No enhancing renal lesion. Simple cyst of the RIGHT kidney ureters and bladder normal. Stomach/Bowel: Stomach, small bowel, appendix, and cecum are normal. The colon and rectosigmoid colon are normal. Vascular/Lymphatic: Abdominal aorta is normal caliber with atherosclerotic calcification. There is no retroperitoneal or periportal lymphadenopathy. No pelvic lymphadenopathy. Reproductive: Uterus and ovaries normal. Other: No peritoneal nodularity.  No omental nodularity Musculoskeletal: Mixed lytic and sclerotic lesion the posterior LEFT iliac bone is not changed. No new lesions identified. IMPRESSION: Chest Impression: 1. Stable perihilar nodule RIGHT lower lobe. 2. No evidence of metastatic progression in thorax. Abdomen / Pelvis Impression: 1. Similar bilateral adrenal metastasis. RIGHT adrenal gland measures slightly larger. 2. No new metastatic disease identified the abdomen or pelvis.  3. Stable exophytic sclerotic metastatic lesion in the LEFT iliac bone. Electronically Signed   By: Suzy Bouchard M.D.   On: 03/02/2016 11:39    ASSESSMENT AND PLAN: This is a very pleasant 45 years old African-American female recently diagnosed with metastatic non-small cell lung cancer, adenocarcinoma  with negative EGFR mutation status post palliative radiotherapy to the left iliac bone and soft tissue mass. She is currently undergoing systemic chemotherapy with carboplatin and Alimta status post 6 cycles.  She is currently undergoing maintenance chemotherapy with single agent Alimta status post 3 cycles. She tolerated the first 3 cycles well except for mild nausea. The restaging CT scan of the chest, abdomen and pelvis showed stable disease in the chest but there was development of left adrenal nodule concerning for possibility of adrenal metastasis. There was also mild increase in the size of the right lower lobe and from a hilar nodule. The recent PET scan confirms evidence of disease progression with new bilateral adrenal metastases in addition to hypermetabolic right paratracheal and right common iliac nodal metastasis. She was started on second line treatment with Tecentriq Huey Bienenstock) status post 6 cycles. She tolerated the last cycle of her treatment well with no significant adverse effects. Unfortunately the patient was found to have new metastatic brain lesions and her treatment was discontinued with Gildardo Pounds Huey Bienenstock). She completed whole brain irradiation for multiple metastatic brain lesions. She is currently on treatment with docetaxel and Cyramza status post one cycle. She tolerated the first week of her treatment well. I recommended for the patient to proceed with cycle #2 today as scheduled. For the left hip pain, she has new metastatic lesion in this area, I referred the patient to Dr. Tammi Klippel for consideration of palliative radiotherapy. She will also continue on fentanyl patch 50 g/hour every 3 days in addition to oxycodone for breakthrough pain. Her pain is a little bit better controlled with this regimen.  The patient would come back for follow-up visit in 3 weeks for evaluation with the start of cycle #3 of her chemotherapy. She was advised to call immediately if she  has any concerning symptoms in the interval. The patient voices understanding of current disease status and treatment options and is in agreement with the current care plan.  All questions were answered. The patient knows to call the clinic with any problems, questions or concerns. We can certainly see the patient much sooner if necessary.  Disclaimer: This note was dictated with voice recognition software. Similar sounding words can inadvertently be transcribed and may not be corrected upon review.

## 2016-03-23 MED FILL — OXYCODONE-APAP 10-325: 10-325 | 8 days supply | Qty: 45 | Fill #0

## 2016-03-23 MED FILL — KLOR-CON M20 TABLET: 20 | 7 days supply | Qty: 7 | Fill #0

## 2016-03-24 ENCOUNTER — Ambulatory Visit: Payer: Self-pay

## 2016-03-29 ENCOUNTER — Ambulatory Visit (HOSPITAL_BASED_OUTPATIENT_CLINIC_OR_DEPARTMENT_OTHER): Payer: Medicaid Other

## 2016-03-29 ENCOUNTER — Other Ambulatory Visit (HOSPITAL_BASED_OUTPATIENT_CLINIC_OR_DEPARTMENT_OTHER): Payer: Medicaid Other

## 2016-03-29 DIAGNOSIS — C3491 Malignant neoplasm of unspecified part of right bronchus or lung: Secondary | ICD-10-CM

## 2016-03-29 DIAGNOSIS — M898X9 Other specified disorders of bone, unspecified site: Secondary | ICD-10-CM

## 2016-03-29 DIAGNOSIS — Z95828 Presence of other vascular implants and grafts: Secondary | ICD-10-CM

## 2016-03-29 LAB — CBC WITH DIFFERENTIAL/PLATELET
BASO%: 0.8 % (ref 0.0–2.0)
Basophils Absolute: 0 10*3/uL (ref 0.0–0.1)
EOS%: 0.2 % (ref 0.0–7.0)
Eosinophils Absolute: 0 10*3/uL (ref 0.0–0.5)
HCT: 37.1 % (ref 34.8–46.6)
HGB: 12.4 g/dL (ref 11.6–15.9)
LYMPH%: 22.1 % (ref 14.0–49.7)
MCH: 29.2 pg (ref 25.1–34.0)
MCHC: 33.4 g/dL (ref 31.5–36.0)
MCV: 87.3 fL (ref 79.5–101.0)
MONO#: 1.1 10*3/uL — ABNORMAL HIGH (ref 0.1–0.9)
MONO%: 22.7 % — ABNORMAL HIGH (ref 0.0–14.0)
NEUT%: 54.2 % (ref 38.4–76.8)
NEUTROS ABS: 2.7 10*3/uL (ref 1.5–6.5)
Platelets: 300 10*3/uL (ref 145–400)
RBC: 4.25 10*6/uL (ref 3.70–5.45)
RDW: 18.6 % — ABNORMAL HIGH (ref 11.2–14.5)
WBC: 4.9 10*3/uL (ref 3.9–10.3)
lymph#: 1.1 10*3/uL (ref 0.9–3.3)

## 2016-03-29 LAB — COMPREHENSIVE METABOLIC PANEL WITH GFR
ALT: 10 U/L (ref 0–55)
AST: 18 U/L (ref 5–34)
Albumin: 3.2 g/dL — ABNORMAL LOW (ref 3.5–5.0)
Alkaline Phosphatase: 113 U/L (ref 40–150)
Anion Gap: 10 meq/L (ref 3–11)
BUN: <4 mg/dL — ABNORMAL LOW (ref 7.0–26.0)
CO2: 20 meq/L — ABNORMAL LOW (ref 22–29)
Calcium: 8.7 mg/dL (ref 8.4–10.4)
Chloride: 105 meq/L (ref 98–109)
Creatinine: 0.6 mg/dL (ref 0.6–1.1)
EGFR: >90 ml/min/1.73 m2
Glucose: 97 mg/dL (ref 70–140)
Potassium: 3.6 meq/L (ref 3.5–5.1)
Sodium: 136 meq/L (ref 136–145)
Total Bilirubin: 0.47 mg/dL (ref 0.20–1.20)
Total Protein: 7.7 g/dL (ref 6.4–8.3)

## 2016-03-29 MED ORDER — SODIUM CHLORIDE 0.9 % IJ SOLN
10.0000 mL | INTRAMUSCULAR | Status: DC | PRN
  Administered 2016-03-29: 10 mL via INTRAVENOUS
  Filled 2016-03-29: qty 10

## 2016-03-29 MED ORDER — HEPARIN SOD (PORK) LOCK FLUSH 100 UNIT/ML IV SOLN
500.0000 [IU] | Freq: Once | INTRAVENOUS | Status: AC | PRN
  Administered 2016-03-29: 500 [IU] via INTRAVENOUS
  Filled 2016-03-29: qty 5

## 2016-03-29 MED ORDER — SODIUM CHLORIDE 0.9 % IV SOLN: Freq: Once | INTRAVENOUS | Status: DC | PRN

## 2016-03-29 MED ORDER — METHYLPREDNISOLONE SODIUM SUCC 125 MG IJ SOLR: 125.0000 mg | Freq: Once | INTRAMUSCULAR | Status: DC | PRN

## 2016-03-29 MED ORDER — DIPHENHYDRAMINE HCL 50 MG/ML IJ SOLN: 25.0000 mg | Freq: Once | INTRAMUSCULAR | Status: DC | PRN

## 2016-03-29 MED ORDER — ALBUTEROL SULFATE (2.5 MG/3ML) 0.083% IN NEBU
2.5000 mg | INHALATION_SOLUTION | Freq: Once | RESPIRATORY_TRACT | Status: DC | PRN
  Filled 2016-03-29: qty 3

## 2016-03-29 MED ORDER — DIPHENHYDRAMINE HCL 50 MG/ML IJ SOLN: 50.0000 mg | Freq: Once | INTRAMUSCULAR | Status: DC | PRN

## 2016-03-29 NOTE — Progress Notes (Signed)
Pt here for lab and flush appointment.  Will keep patient here until labs result in the event that pt has abnormal lab work d/t previous low potassium level on 11/22 and the need for IV potassium on that day.   All labs wnl.  Pt discharged

## 2016-03-29 NOTE — Patient Instructions (Signed)

## 2016-03-30 ENCOUNTER — Encounter: Payer: Self-pay | Admitting: *Deleted

## 2016-03-30 ENCOUNTER — Other Ambulatory Visit: Payer: Self-pay | Admitting: Internal Medicine

## 2016-03-30 DIAGNOSIS — C7951 Secondary malignant neoplasm of bone: Secondary | ICD-10-CM

## 2016-03-30 DIAGNOSIS — C3491 Malignant neoplasm of unspecified part of right bronchus or lung: Secondary | ICD-10-CM

## 2016-03-30 NOTE — Progress Notes (Signed)
[place] Work  Clinical Social Work received voicemail from patient.  CSW returned patient's call- Andrea [name] shared she is not doing well- experiencing a significant amount of pain.  She recently received support from her lung cancer support group- [employer] and discussed ongoing counseling from [place], [place].  Patient's therapist will be unable to see patient further- CSW discussed other therapists available to meet with patient. Andrea [name] requested a second opinion and palliative care consult for symptom management/goals of care.  CSW discussed with lung nurse navigator.  CSW will continue to follow up as needed.   Polo Riley, MSW, LCSW, OSW-C Clinical Social Worker [facility] [phone]

## 2016-03-31 ENCOUNTER — Encounter: Payer: Self-pay | Admitting: *Deleted

## 2016-03-31 NOTE — Progress Notes (Signed)
Oncology Nurse Navigator Documentation  Oncology Nurse Navigator Flowsheets 03/31/2016  Navigator Location [facility]  Navigator Encounter Type Other/yesterday, Lauren, Dr. Julien Nordmann and myself followed up on Ms. Minihan and her treatment.  Patient would like a second opinion at [facility].  Referral made and I followed up with Maudie Mercury in [place] about referral.  She stated she has received it and is working on referral.  I asked that she contact me if needed.  I completed palliative care form and Dr. Julien Nordmann singed.  I called palliative care to ask if they needed anymore information and was told no.    Patient Visit Type Other  Treatment Phase Treatment  Barriers/Navigation Needs Coordination of Care  Interventions Coordination of Care  Coordination of Care Appts;Hospice/Palliative Care;Other  Acuity Level 2  Acuity Level 2 Other;Assistance expediting appointments  Time Spent with Patient 45

## 2016-04-03 ENCOUNTER — Encounter: Payer: Self-pay | Admitting: *Deleted

## 2016-04-03 ENCOUNTER — Telehealth: Payer: Self-pay | Admitting: Internal Medicine

## 2016-04-03 NOTE — Telephone Encounter (Signed)
Faxed records to Dr..  Stinchcomb's office. The nurse will review records and call pt with appt.

## 2016-04-03 NOTE — Progress Notes (Signed)
Oncology Nurse Navigator Documentation  Oncology Nurse Navigator Flowsheets 04/03/2016  Navigator Location [facility]  Navigator Encounter Type Other/I followed up with Maudie Mercury in [employer] regarding referral.  She has received it and will work on this today.   Treatment Phase Treatment  Barriers/Navigation Needs Coordination of Care  Interventions Coordination of Care  Coordination of Care Other  Acuity Level 2  Acuity Level 2 Referrals such as genetics, survivorship  Time Spent with Patient 15

## 2016-04-04 ENCOUNTER — Telehealth: Payer: Self-pay | Admitting: Internal Medicine

## 2016-04-04 NOTE — Telephone Encounter (Signed)
PT APPT WITH DR Durenda Hurt IS 04/13/16'@2'$ :00.  PT IS AWARE

## 2016-04-05 ENCOUNTER — Other Ambulatory Visit: Payer: Self-pay | Admitting: Medical Oncology

## 2016-04-05 ENCOUNTER — Encounter: Payer: Self-pay | Admitting: *Deleted

## 2016-04-05 ENCOUNTER — Other Ambulatory Visit: Payer: Self-pay | Admitting: Internal Medicine

## 2016-04-05 ENCOUNTER — Other Ambulatory Visit (HOSPITAL_BASED_OUTPATIENT_CLINIC_OR_DEPARTMENT_OTHER): Payer: Medicaid Other

## 2016-04-05 ENCOUNTER — Ambulatory Visit (HOSPITAL_BASED_OUTPATIENT_CLINIC_OR_DEPARTMENT_OTHER): Payer: Medicaid Other

## 2016-04-05 VITALS — BP 91/66 | HR 93 | Temp 97.8°F | Resp 18

## 2016-04-05 DIAGNOSIS — M898X9 Other specified disorders of bone, unspecified site: Secondary | ICD-10-CM

## 2016-04-05 DIAGNOSIS — C7951 Secondary malignant neoplasm of bone: Secondary | ICD-10-CM

## 2016-04-05 DIAGNOSIS — E876 Hypokalemia: Secondary | ICD-10-CM

## 2016-04-05 DIAGNOSIS — Z95828 Presence of other vascular implants and grafts: Secondary | ICD-10-CM

## 2016-04-05 DIAGNOSIS — C3491 Malignant neoplasm of unspecified part of right bronchus or lung: Secondary | ICD-10-CM

## 2016-04-05 DIAGNOSIS — Z5112 Encounter for antineoplastic immunotherapy: Secondary | ICD-10-CM

## 2016-04-05 DIAGNOSIS — C3490 Malignant neoplasm of unspecified part of unspecified bronchus or lung: Secondary | ICD-10-CM

## 2016-04-05 LAB — COMPREHENSIVE METABOLIC PANEL
ALT: 10 U/L (ref 0–55)
ANION GAP: 10 meq/L (ref 3–11)
AST: 17 U/L (ref 5–34)
Albumin: 3.4 g/dL — ABNORMAL LOW (ref 3.5–5.0)
Alkaline Phosphatase: 100 U/L (ref 40–150)
CHLORIDE: 107 meq/L (ref 98–109)
CO2: 19 meq/L — AB (ref 22–29)
CREATININE: 0.7 mg/dL (ref 0.6–1.1)
Calcium: 8.3 mg/dL — ABNORMAL LOW (ref 8.4–10.4)
EGFR: >90 mL/min/{1.73_m2} (ref >90)
GLUCOSE: 127 mg/dL (ref 70–140)
Potassium: 2.8 mEq/L — CL (ref 3.5–5.1)
SODIUM: 137 meq/L (ref 136–145)
TOTAL PROTEIN: 7.4 g/dL (ref 6.4–8.3)
Total Bilirubin: 0.46 mg/dL (ref 0.20–1.20)

## 2016-04-05 LAB — CBC WITH DIFFERENTIAL/PLATELET
BASO%: 0.2 % (ref 0.0–2.0)
Basophils Absolute: 0 10*3/uL (ref 0.0–0.1)
EOS%: 0 % (ref 0.0–7.0)
Eosinophils Absolute: 0 10*3/uL (ref 0.0–0.5)
HCT: 37.5 % (ref 34.8–46.6)
HGB: 12.7 g/dL (ref 11.6–15.9)
LYMPH%: 26.7 % (ref 14.0–49.7)
MCH: 29.3 pg (ref 25.1–34.0)
MCHC: 33.9 g/dL (ref 31.5–36.0)
MCV: 86.4 fL (ref 79.5–101.0)
MONO#: 0.5 10*3/uL (ref 0.1–0.9)
MONO%: 9.4 % (ref 0.0–14.0)
NEUT%: 63.7 % (ref 38.4–76.8)
NEUTROS ABS: 3.7 10*3/uL (ref 1.5–6.5)
NRBC: 1 % — AB (ref 0–0)
Platelets: 233 10*3/uL (ref 145–400)
RBC: 4.34 10*6/uL (ref 3.70–5.45)
RDW: 19.9 % — AB (ref 11.2–14.5)
WBC: 5.8 10*3/uL (ref 3.9–10.3)
lymph#: 1.5 10*3/uL (ref 0.9–3.3)

## 2016-04-05 MED ORDER — HEPARIN SOD (PORK) LOCK FLUSH 100 UNIT/ML IV SOLN
500.0000 [IU] | Freq: Once | INTRAVENOUS | Status: AC | PRN
  Administered 2016-04-05: 500 [IU] via INTRAVENOUS
  Filled 2016-04-05: qty 5

## 2016-04-05 MED ORDER — SODIUM CHLORIDE 0.9 % IJ SOLN
10.0000 mL | INTRAMUSCULAR | Status: DC | PRN
  Administered 2016-04-05: 10 mL via INTRAVENOUS
  Filled 2016-04-05: qty 10

## 2016-04-05 MED ORDER — POTASSIUM CHLORIDE 10 MEQ/100ML IV SOLN: 10.0000 meq | INTRAVENOUS | Status: DC

## 2016-04-05 MED ORDER — POTASSIUM CHLORIDE CRYS ER 20 MEQ PO TBCR: 20.0000 meq | EXTENDED_RELEASE_TABLET | Freq: Two times a day (BID) | ORAL | 0 refills | Status: DC

## 2016-04-05 MED ORDER — OXYCODONE-ACETAMINOPHEN 10-325 MG PO TABS: 1.0000 | ORAL_TABLET | ORAL | 0 refills | Status: DC | PRN

## 2016-04-05 MED ORDER — LEVETIRACETAM 1000 MG PO TABS: 1000.0000 mg | ORAL_TABLET | Freq: Two times a day (BID) | ORAL | 0 refills | Status: DC

## 2016-04-05 MED ORDER — SODIUM CHLORIDE 0.9 % IV SOLN
Freq: Once | INTRAVENOUS | Status: AC
  Administered 2016-04-05: 10:00:00 via INTRAVENOUS
  Filled 2016-04-05: qty 250

## 2016-04-05 MED ORDER — FENTANYL 50 MCG/HR TD PT72: 50.0000 ug | MEDICATED_PATCH | TRANSDERMAL | 0 refills | Status: DC

## 2016-04-05 MED ORDER — LIDOCAINE-PRILOCAINE 2.5-2.5 % EX CREA: TOPICAL_CREAM | CUTANEOUS | 99 refills | Status: DC

## 2016-04-05 MED FILL — KLOR-CON M20 TABLET: 20 | 10 days supply | Qty: 20 | Fill #0

## 2016-04-05 MED FILL — fentaNYL 50 MCG/HR PT72: 50 | 30 days supply | Qty: 10 | Fill #0

## 2016-04-05 MED FILL — OXYCODONE-APAP 10-325: 10-325 | 8 days supply | Qty: 45 | Fill #0

## 2016-04-05 MED FILL — levETIRAcetam 1000 MG TABS: 1000 | 30 days supply | Qty: 60 | Fill #0

## 2016-04-05 MED FILL — LIDOCAINE-PRILOCAINE CREAM: 2.5-2.5 | 15 days supply | Qty: 30 | Fill #0

## 2016-04-05 NOTE — Patient Instructions (Signed)
Hypokalemia Hypokalemia means that the amount of potassium in the blood is lower than normal.Potassium is a chemical that helps regulate the amount of fluid in the body (electrolyte). It also stimulates muscle tightening (contraction) and helps nerves work properly.Normally, most of the body's potassium is inside of cells, and only a very small amount is in the blood. Because the amount in the blood is so small, minor changes to potassium levels in the blood can be life-threatening. What are the causes? This condition may be caused by:  Antibiotic medicine.  Diarrhea or vomiting. Taking too much of a medicine that helps you have a bowel movement (laxative) can cause diarrhea and lead to hypokalemia.  Chronic kidney disease (CKD).  Medicines that help the body get rid of excess fluid (diuretics).  Eating disorders, such as bulimia.  Low magnesium levels in the body.  Sweating a lot. What are the signs or symptoms? Symptoms of this condition include:  Weakness.  Constipation.  Fatigue.  Muscle cramps.  Mental confusion.  Skipped heartbeats or irregular heartbeat (palpitations).  Tingling or numbness. How is this diagnosed? This condition is diagnosed with a blood test. How is this treated? Hypokalemia can be treated by taking potassium supplements by mouth or adjusting the medicines that you take. Treatment may also include eating more foods that contain a lot of potassium. If your potassium level is very low, you may need to get potassium through an IV tube in one of your veins and be monitored in the hospital. Follow these instructions at home:  Take over-the-counter and prescription medicines only as told by your health care provider. This includes vitamins and supplements.  Eat a healthy diet. A healthy diet includes fresh fruits and vegetables, whole grains, healthy fats, and lean proteins.  If instructed, eat more foods that contain a lot of potassium, such  as:  Nuts, such as peanuts and pistachios.  Seeds, such as sunflower seeds and pumpkin seeds.  Peas, lentils, and lima beans.  Whole grain and bran cereals and breads.  Fresh fruits and vegetables, such as apricots, avocado, bananas, cantaloupe, kiwi, oranges, tomatoes, asparagus, and potatoes.  Orange juice.  Tomato juice.  Red meats.  Yogurt.  Keep all follow-up visits as told by your health care provider. This is important. Contact a health care provider if:  You have weakness that gets worse.  You feel your heart pounding or racing.  You vomit.  You have diarrhea.  You have diabetes (diabetes mellitus) and you have trouble keeping your blood sugar (glucose) in your target range. Get help right away if:  You have chest pain.  You have shortness of breath.  You have vomiting or diarrhea that lasts for more than 2 days.  You faint. This information is not intended to replace advice given to you by your health care provider. Make sure you discuss any questions you have with your health care provider. Document Released: 04/10/2005 Document Revised: 11/27/2015 Document Reviewed: 11/27/2015 Elsevier Interactive Patient Education  2017 [employer]  

## 2016-04-05 NOTE — Progress Notes (Signed)
Call placed to pt to confirm appt time in X[facility] on 04/12/16 at 3:30PM.  Patient appreciative of call and has no questions at this time.

## 2016-04-05 NOTE — Addendum Note (Signed)
Addended by: Henreitta Leber E on: 04/05/2016 10:17 AM   Modules accepted: Orders

## 2016-04-07 NOTE — Progress Notes (Signed)
Counseling intern left patient a voicemail stating counselor will be away from hospital until January 2. Counselor will check in with patient the first week of January.  Lamount Cohen, Counseling Intern Department for Spiritual Care and [facility] Supervisor - Scientist, research (physical sciences)

## 2016-04-12 ENCOUNTER — Ambulatory Visit: Payer: Medicaid Other

## 2016-04-12 ENCOUNTER — Ambulatory Visit (HOSPITAL_BASED_OUTPATIENT_CLINIC_OR_DEPARTMENT_OTHER): Payer: Medicaid Other | Admitting: Nurse Practitioner

## 2016-04-12 ENCOUNTER — Ambulatory Visit
Admission: RE | Admit: 2016-04-12 | Discharge: 2016-04-12 | Disposition: A | Discharge status: Home / Self Care | Payer: Medicaid Other | Source: Ambulatory Visit | Attending: Radiation Oncology | Admitting: Radiation Oncology

## 2016-04-12 ENCOUNTER — Encounter (HOSPITAL_COMMUNITY): Payer: Self-pay

## 2016-04-12 ENCOUNTER — Encounter: Payer: Self-pay | Admitting: Radiation Oncology

## 2016-04-12 ENCOUNTER — Ambulatory Visit (HOSPITAL_COMMUNITY)
Admission: RE | Admit: 2016-04-12 | Discharge: 2016-04-12 | Disposition: A | Discharge status: Home / Self Care | Payer: Medicaid Other | Source: Ambulatory Visit | Attending: Nurse Practitioner | Admitting: Nurse Practitioner

## 2016-04-12 ENCOUNTER — Other Ambulatory Visit (HOSPITAL_BASED_OUTPATIENT_CLINIC_OR_DEPARTMENT_OTHER): Payer: Medicaid Other

## 2016-04-12 VITALS — BP 109/73 | HR 77 | Resp 19 | Wt 138.0 lb

## 2016-04-12 VITALS — BP 109/73 | HR 77 | Resp 19 | Ht 66.0 in | Wt 138.0 lb

## 2016-04-12 DIAGNOSIS — I700 Atherosclerosis of aorta: Secondary | ICD-10-CM | POA: Diagnosis not present

## 2016-04-12 DIAGNOSIS — C7951 Secondary malignant neoplasm of bone: Secondary | ICD-10-CM | POA: Insufficient documentation

## 2016-04-12 DIAGNOSIS — C7972 Secondary malignant neoplasm of left adrenal gland: Secondary | ICD-10-CM | POA: Insufficient documentation

## 2016-04-12 DIAGNOSIS — E876 Hypokalemia: Secondary | ICD-10-CM

## 2016-04-12 DIAGNOSIS — C7952 Secondary malignant neoplasm of bone marrow: Secondary | ICD-10-CM

## 2016-04-12 DIAGNOSIS — C7971 Secondary malignant neoplasm of right adrenal gland: Secondary | ICD-10-CM | POA: Diagnosis not present

## 2016-04-12 DIAGNOSIS — Z510 Encounter for antineoplastic radiation therapy: Secondary | ICD-10-CM | POA: Insufficient documentation

## 2016-04-12 DIAGNOSIS — R634 Abnormal weight loss: Secondary | ICD-10-CM | POA: Diagnosis not present

## 2016-04-12 DIAGNOSIS — K920 Hematemesis: Secondary | ICD-10-CM

## 2016-04-12 DIAGNOSIS — C3491 Malignant neoplasm of unspecified part of right bronchus or lung: Secondary | ICD-10-CM | POA: Insufficient documentation

## 2016-04-12 HISTORY — DX: Malignant neoplasm of bone and articular cartilage, unspecified: C41.9

## 2016-04-12 LAB — CBC WITH DIFFERENTIAL/PLATELET
BASO%: 0.3 % (ref 0.0–2.0)
BASOS ABS: 0 10*3/uL (ref 0.0–0.1)
EOS ABS: 0 10*3/uL (ref 0.0–0.5)
EOS%: 0 % (ref 0.0–7.0)
HCT: 39.4 % (ref 34.8–46.6)
HGB: 13.2 g/dL (ref 11.6–15.9)
LYMPH%: 23.2 % (ref 14.0–49.7)
MCH: 29.2 pg (ref 25.1–34.0)
MCHC: 33.5 g/dL (ref 31.5–36.0)
MCV: 87.2 fL (ref 79.5–101.0)
MONO#: 0.4 10*3/uL (ref 0.1–0.9)
MONO%: 9.6 % (ref 0.0–14.0)
NEUT%: 66.9 % (ref 38.4–76.8)
NEUTROS ABS: 2.7 10*3/uL (ref 1.5–6.5)
Platelets: 298 10*3/uL (ref 145–400)
RBC: 4.52 10*6/uL (ref 3.70–5.45)
RDW: 20.6 % — ABNORMAL HIGH (ref 11.2–14.5)
WBC: 4 10*3/uL (ref 3.9–10.3)
lymph#: 0.9 10*3/uL (ref 0.9–3.3)

## 2016-04-12 LAB — UA PROTEIN, DIPSTICK - CHCC: PROTEIN: 30 mg/dL

## 2016-04-12 LAB — COMPREHENSIVE METABOLIC PANEL
ALK PHOS: 76 U/L (ref 40–150)
ALT: 10 U/L (ref 0–55)
ANION GAP: 11 meq/L (ref 3–11)
AST: 14 U/L (ref 5–34)
Albumin: 4.1 g/dL (ref 3.5–5.0)
BILIRUBIN TOTAL: 0.87 mg/dL (ref 0.20–1.20)
BUN: 5.1 mg/dL — ABNORMAL LOW (ref 7.0–26.0)
CO2: 17 meq/L — AB (ref 22–29)
Calcium: 8.5 mg/dL (ref 8.4–10.4)
Chloride: 106 mEq/L (ref 98–109)
Creatinine: 0.7 mg/dL (ref 0.6–1.1)
EGFR: >90 mL/min/{1.73_m2} (ref >90)
Glucose: 128 mg/dl (ref 70–140)
Potassium: 3.6 mEq/L (ref 3.5–5.1)
Sodium: 135 mEq/L — ABNORMAL LOW (ref 136–145)
TOTAL PROTEIN: 8 g/dL (ref 6.4–8.3)

## 2016-04-12 MED ORDER — IOPAMIDOL (ISOVUE-300) INJECTION 61%
INTRAVENOUS | Status: AC
  Administered 2016-04-12: 30 mL via ORAL
  Filled 2016-04-12: qty 30

## 2016-04-12 MED ORDER — HEPARIN SOD (PORK) LOCK FLUSH 100 UNIT/ML IV SOLN
500.0000 [IU] | Freq: Once | INTRAVENOUS | Status: DC | PRN
  Filled 2016-04-12: qty 5

## 2016-04-12 MED ORDER — HEPARIN SOD (PORK) LOCK FLUSH 100 UNIT/ML IV SOLN
500.0000 [IU] | Freq: Once | INTRAVENOUS | Status: AC
  Administered 2016-04-12: 500 [IU] via INTRAVENOUS

## 2016-04-12 MED ORDER — SODIUM CHLORIDE 0.9 % IJ SOLN
10.0000 mL | Freq: Once | INTRAMUSCULAR | Status: AC
  Administered 2016-04-12: 10 mL via INTRAVENOUS

## 2016-04-12 MED ORDER — SODIUM CHLORIDE 0.9% FLUSH
10.0000 mL | INTRAVENOUS | Status: DC | PRN
  Administered 2016-04-12: 10 mL
  Filled 2016-04-12: qty 10

## 2016-04-12 MED ORDER — IOPAMIDOL (ISOVUE-300) INJECTION 61%
INTRAVENOUS | Status: AC
  Administered 2016-04-12: 100 mL
  Filled 2016-04-12: qty 100

## 2016-04-12 NOTE — Progress Notes (Signed)
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 There was poor lesion in the adjacent left posterior sacrum measuring 2.5 x 1.5 cm and there was severe soft tissue edema with enhancement in the adjacent left multifidus muscle concerning for extension of metastatic disease   Past/Anticipated chemotherapy by medical oncology, if any:  PRIOR THERAPY:  1) Status post palliative radiotherapy to the soft tissue mass in the left iliac bone under the care of Dr. Valere Dross. 2) Systemic chemotherapy with carboplatin for AUC of 5 and Alimta 500 MG/M2 every 3 weeks. First dose expected on 02/22/2015. Status post 6 cycles. Starting from cycle #3 Alimta was reduced to 400 MG/M2 secondary to neutropenia after the first 2 cycles. 3) Maintenance systemic chemotherapy with single agent Alimta 500 MG/M2 every 3 weeks. First dose 07/19/2015. Status post 3 cycles. Last dose was given 08/30/2015 discontinued secondary to disease progression. 4)  Immunotherapy treatment with Tecentriq (Atezolizumab) 1200 MG IV every 3 weeks, for his dose 10/14/2015. Status post 6 cycles, discontinued secondary to disease progression. 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 First dose 03/01/2016. She is status post 1 cycle.   Pain on a scale of 0-10 is: left hip  pain 9 on a scale of 0-10 worse when she sits for long periods or walks   If Spine Met(s), symptoms, if any, include:  Bowel/Bladder retention or incontinence (please describe): no  Numbness or weakness in extremities (please describe): generalized weakness and deconditioning but, no numbness of tingling in left leg  Current Decadron regimen, if applicable: only the day before, day of, and the day after chemotherapy  Ambulatory status? Walker? Wheelchair?: ambulatory with aid of cruches  SAFETY ISSUES:  Prior radiation? yes  Pacemaker/ICD? no  Possible current pregnancy? no  Is the patient on methotrexate? no  Current Complaints / other details:  45 year old female. Weight loss. Reports nausea and vomiting related to potassium. Going to [facility] tomorrow for a second opinion. Residing at home alone. Reports an aid comes into the home each day between 12-2.

## 2016-04-12 NOTE — Progress Notes (Addendum)
[facility] OFFICE PROGRESS NOTE   DIAGNOSIS: Stage IV (T1a, N2, M1b) non-small cell lung cancer, adenocarcinoma with negative EGFR mutation diagnosed in August 2016 and presented with right hilar and subcarinal lymphadenopathy as well as large soft tissue mass in the left posterior iliac bone.  PRIOR THERAPY:  1) Status post palliative radiotherapy to the soft tissue mass in the left iliac bone under the care of Dr. Valere Dross. 2) Systemic chemotherapy with carboplatin for AUC of 5 and Alimta 500 MG/M2 every 3 weeks. First dose expected on 02/22/2015. Status post 6 cycles. Starting from cycle #3 Alimta was reduced to 400 MG/M2 secondary to neutropenia after the first 2 cycles. 3) Maintenance systemic chemotherapy with single agent Alimta 500 MG/M2 every 3 weeks. First dose 07/19/2015. Status post 3 cycles. Last dose was given 08/30/2015 discontinued secondary to disease progression. 4)  Immunotherapy treatment with Tecentriq (Atezolizumab) 1200 MG IV every 3 weeks, for his dose 10/14/2015. Status post 6 cycles, discontinued secondary to disease progression. 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 First dose 03/01/2016. She is status post 1 cycle.  INTERVAL HISTORY:   Ms. Thaker returns as scheduled. She completed cycle 2 Taxotere/ramucirumab 03/22/2016. She is having intermittent vomiting. This typically occurs after she takes potassium. For the past week she has noted blood mixed with the emesis. Appetite is poor. She is losing weight. She denies any mouth sores. She continues to have 3-4 loose bowel movements a day. She has pain at the left hip and buttock. She is scheduled to see radiation oncology later today. She reports having an appointment at [facility] tomorrow for a second opinion.  Objective:  Vital signs in last 24  hours:  Blood pressure 109/73, pulse 77, resp. rate 19, weight 138 lb (62.6 kg), SpO2 100 %.    HEENT: No thrush or ulcers. Resp: Lungs clear bilaterally. Cardio: Regular rate and rhythm. GI: Abdomen soft and nontender. Vascular: No leg edema. Port-A-Cath without erythema.    Lab Results:  Lab Results  Component Value Date   WBC 4.0 04/12/2016   HGB 13.2 04/12/2016   HCT 39.4 04/12/2016   MCV 87.2 04/12/2016   PLT 298 04/12/2016   NEUTROABS 2.7 04/12/2016    Imaging:  No results found.  Medications: I have reviewed the patient's current medications.  Assessment/Plan: 1. Metastatic non-small cell lung cancer, adenocarcinoma with negative EGFR mutation status post palliative radiotherapy to the left iliac bone and soft tissue mass. Treated in the past with carboplatin and Alimta 6 cycles; single agent Alimta 3 cycles discontinued due to disease progression; Atezolizumab 6 cycles discontinued due to disease progression; status post whole brain radiation for multiple metastatic brain lesions; Taxotere/cyramza cycle 2 given 03/22/2016.   Disposition: Ms. Redlich has completed 2 cycles of Taxotere/cyramza. She has lost a significant amount of weight over the past 3 weeks. She intermittently vomits which she relates to oral potassium. Over the past week she has noted blood mixed with the emesis.  Dr. Julien Nordmann recommends holding today's treatment due to the weight loss and hematemesis. He further recommends to obtain restaging CT scans today so she will have the results in time for her appointment at [facility] tomorrow.  She will follow-up with Dr. Tammi Klippel as scheduled later today regarding palliative radiation.  We will schedule a return visit in one week.  Patient seen with Dr.  Mohamed.  Ned Card ANP/GNP-BC   04/12/2016  10:27 AM  ADDENDUM: Hematology/Oncology Attending: I had a face to face encounter with the patient. I recommended her care plan. This is a very pleasant  45 years old African-American female with stage IV non-small cell lung cancer, adenocarcinoma status post chemotherapy was carboplatin and Alimta followed by maintenance single agent Alimta for 3 cycles discontinued secondary to disease progression followed by 6 cycles of treatment with immunotherapy with Tecentriq (Atezolizumab) discontinued secondary to disease progression. The patient was also treated with whole brain irradiation for multiple brain metastasis. She is currently on systemic chemotherapy with docetaxel and Cyramza status post 2 cycles. She has been tolerating her treatment well except for fatigue and diarrhea with hypokalemia. She was treated recently with potassium supplements for the hypokalemia. She has hard time tolerating the potassium supplements with nausea and vomiting and occasional hematemesis. I recommended for the patient to hold her treatment with docetaxel and Cyramza for now. I will arrange for the patient to have repeat CT scan of the chest, abdomen and pelvis later today for restaging of her disease before her visit at [place] second opinion tomorrow. For the metastatic bone disease, she is scheduled to see Dr. Tammi Klippel later today for consideration of palliative radiotherapy to the bone lesions in the hip area. She will continue with the current pain medication for now. She would come back for follow-up visit in one week for evaluation. She was advised to call immediately if she has any concerning symptoms in the interval.  Disclaimer: This note was dictated with voice recognition software. Similar sounding words can inadvertently be transcribed and may be missed upon review. Eilleen Kempf., MD 04/14/16

## 2016-04-12 NOTE — Addendum Note (Signed)
Addended by: Flo Shanks on: 04/12/2016 12:58 PM   Modules accepted: Miquel Dunn

## 2016-04-12 NOTE — Progress Notes (Signed)
  Radiation Oncology         [phone] ________________________________  Name: Andrea [name] MRN: [id number]  Date: 04/12/2016  DOB: 13-Jul-1970  SIMULATION AND TREATMENT PLANNING NOTE    ICD-9-CM ICD-10-CM   1. Bone metastases (HCC) 198.5 C79.51     DIAGNOSIS:  45 yo woman with recurrent left iliac metastasis in a previously treated site  NARRATIVE:  The patient was brought to the [facility].  Identity was confirmed.  All relevant records and images related to the planned course of therapy were reviewed.  The patient freely provided informed written consent to proceed with treatment after reviewing the details related to the planned course of therapy. The consent form was witnessed and verified by the simulation staff.  Then, the patient was set-up in a stable reproducible  supine position for radiation therapy.  CT images were obtained.  Surface markings were placed.  The CT images were loaded into the planning software.  Then the target and avoidance structures were contoured.  Treatment planning then occurred.  The radiation prescription was entered and confirmed.  Then, I designed and supervised the construction of a total of 3 medically necessary complex treatment devices including BodyFix and 2 MLC.  I have requested : This treatment constitutes a Special Treatment Procedure for the following reason: [ Retreatment in a previously radiated area requiring careful monitoring of increased risk of toxicity due to overlap of previous treatment.  PLAN:  The patient will receive 30 Gy in 10 fraction.  ________________________________  Sheral Apley Tammi Klippel, M.D.

## 2016-04-12 NOTE — Patient Instructions (Signed)

## 2016-04-12 NOTE — Progress Notes (Signed)
Radiation Oncology         [phone] ________________________________  Outpatient Re-Consultation  Name: Andrea [name] MRN: [id number]  Date: 04/12/2016  DOB: [birth year]  CC:[name], MD  Curt Bears, MD   REFERRING PHYSICIAN: Curt Bears, MD  DIAGNOSIS: The primary encounter diagnosis was Bone metastases [facility]). A diagnosis of Non-small cell carcinoma of right lung, stage 4 (HCC) was also pertinent to this visit.    ICD-9-CM ICD-10-CM   1. Bone metastases (HCC) 198.5 C79.51 sodium chloride 0.9 % injection 10 mL     heparin lock flush 100 unit/mL  2. Non-small cell carcinoma of right lung, stage 4 (HCC) 162.9 C34.91     HISTORY OF PRESENT ILLNESS: Andrea [name] is a 45 y.o. female  seen at the request of Dr.Feng and  Mohamed with a history of Stage IV (T1a, N2, M1b) non-small cell lung cancer, adenocarcinoma of the right lung and involving the left posterior iliac bone at presentation. The patient received palliative radiotherapy up front to the left iliac region, and went on to receive systemic therapy with taxol/carboplatin, then alimta for maintenance. She developed disease progression on this regimen and was changed to immunotherapy with Tecentriq in June 2017. She has completed 5 cycles, and was seen on 01/24/16 with the intention of continuation of immunotherapy. She developed seizure like activity on three occasions and was seen in symptom management. A stat brain MRI on 02/01/16 revealed a 3 cm lobulated mass in the anterior left frontal lobe with a large area of surrounding vasogenic edema. There is also rightward midline shift up to 10 mm. The smallest brain metastases are punctate, and two 1.4-1.5 cm metastases in the right parietal lobe and right posterior operculum, and three small left cerebellar metastases.  She went on t complete whole brain radiotherapy. She is seen today for reconsultation due to progressive disease in the left ilium measured at 2.1 x 2.5 x 3  cm, and adjacent left posterior sacral disease measured at 2.5 cm x 1.5 cm. Her systemic regimen is taxotere/cyramza with Dr. Julien Nordmann. She comes today to discuss palliative radiation due to pain in the left hip and buttock.   PREVIOUS RADIATION THERAPY: Yes   02/03/16-02/16/16: The whole brain was treated to 30 Gy in 10 fractions of 3 Gy  01/01/2015 through 01/21/2015: Left iliac bone 35 Gy in 14 sessions   PAST MEDICAL HISTORY:  Past Medical History:  Diagnosis Date  . 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 Anginal pain ([place])    chest pain not sure what related to  . Anxiety   . Bone cancer ([place])    lung ca with bone mets  . Chronic back pain   . Depression   . Dysmenorrhea 12/06/2009   Qualifier: Diagnosis of  By: [name] RN, Regino Schultze    . Encounter for antineoplastic immunotherapy 10/07/2015  . Family history of adverse reaction to anesthesia    mother stopped breathing after surgery  . Headache    stress  . Low back pain radiating to left leg 11/29/2010  . Non-small cell carcinoma of right lung, stage 4 ([place]) 01/01/2015  . S/P radiation therapy 01/01/2015 through 01/21/2015     Left iliac bone 3500 cGy in 14 sessions   . Shortness of breath dyspnea    with exertion, Pt denies 02/2015      PAST SURGICAL HISTORY: Past Surgical History:  Procedure Laterality Date  . laproscopy surgery to check her tubes    . LUMBAR LAMINECTOMY/DECOMPRESSION  MICRODISCECTOMY Left 05/01/2014   Procedure: Microdiscectomy - L5-S1 - left;  Surgeon: Eustace Moore, MD;  Location: [facility];  Service: Neurosurgery;  Laterality: Left;    FAMILY HISTORY:  Family History  Problem Relation Age of Onset  . Diabetes Mother   . Hypertension Mother   . Hypertension Father   . Heart attack Maternal Grandfather   . Hypertension Maternal Grandfather   . Heart attack Paternal Grandfather   . Hypertension Paternal Grandfather     SOCIAL  HISTORY:  Social History   Social History  . Marital status: Single    Spouse name: N/A  . Number of children: 0  . Years of education: N/A   Occupational History  . Not on file.   Social History Main Topics  . Smoking status: Former Smoker    Packs/day: 0.50    Years: 25.00    Types: Cigarettes, E-cigarettes    Quit date: 12/29/2014  . Smokeless tobacco: Never Used     Comment: Smoking very little.  . Alcohol use No  . Drug use: No     Comment: previoulsly.  [name] Sexual activity: Not Currently   Other Topics Concern  . Not on file   Social History Narrative  . No narrative on file  The [place] lives in [place] She is single. She has family nearby who help her with activities.  ALLERGIES: Morphine and related and Hydrocodone  MEDICATIONS:  Current Outpatient Prescriptions  Medication Sig Dispense Refill  . dexamethasone (DECADRON) 4 MG tablet 2 tablet by mouth twice a day the day before, day of and day after the chemotherapy every 3 weeks 60 tablet 1  . fentaNYL (DURAGESIC - DOSED MCG/HR) 50 MCG/HR Place 1 patch (50 mcg total) onto the skin every 3 (three) days. 10 patch 0  . levETIRAcetam (KEPPRA) 1000 MG tablet Take 1 tablet (1,000 mg total) by mouth 2 (two) times daily. 60 tablet 0  . lidocaine-prilocaine (EMLA) cream Apply 1 teaspoon to skin 1 to 2 hours before use cover to keep in contact with skin 30 g PRN  . loratadine (CLARITIN) 10 MG tablet Take 1 tablet (10 mg total) by mouth daily.    [name] oxyCODONE-acetaminophen (PERCOCET) 10-325 MG tablet Take 1 tablet by mouth every 4 (four) hours as needed for pain. 45 tablet 0  . potassium chloride SA (K-DUR,KLOR-CON) 20 MEQ tablet Take 1 tablet (20 mEq total) by mouth 2 (two) times daily. 20 tablet 0  . prochlorperazine (COMPAZINE) 10 MG tablet Take 1 tablet (10 mg total) by mouth every 6 (six) hours as needed for nausea or vomiting. 60 tablet 0  . senna-docusate (SENOKOT-S) 8.6-50 MG per tablet Take 2 tablets by mouth at  bedtime. 30 tablet 0  . dexamethasone (DECADRON) 4 MG tablet Take 1 tablet (4 mg total) by mouth 2 (two) times daily. (Patient not taking: Reported on 04/12/2016) 40 tablet 1  . Multiple Vitamins-Minerals (MULTIVITAMIN & MINERAL PO) Take 2 tablets by mouth daily. Reported on 11/01/2015     No current facility-administered medications for this encounter.     REVIEW OF SYSTEMS:  On review of systems, the patient reports that is doing poorly overall, struggling with nutrition and progressive left hip and low back pain. She discuss that since completing rad to brain she has had left hip and low spine discomfort. She has been using fentanyl and percocet for pain relief, she is using crutches to avoid weight bearing on the left, and reports unsteadiness on the left  because of this pain. She denies any radiculopathy lower than the hip in the left, and reports intact sensation of the left lower extremities bilaterally. She denies any chest pain, shortness of breath, cough, fevers, chills, night sweats, unintended weight changes. She denies any bowel or bladder disturbances, and denies abdominal pain. Reports nausea and vomiting related to potassium. She reports worsening left hip pain since October 2017. This left hip pain is a 9 on a scale of 0-10 and worse when she sits for long periods or walks. This pain shoots down her leg. She reports generalized weakness and deconditioning but, no numbness or tingling in the left leg. She also reports pain in the lower back. Residing at home alone. Reports an aid comes into the home each day between 12-2. A complete review of systems is obtained and is otherwise negative.    PHYSICAL EXAM:  Wt Readings from Last 3 Encounters:  04/12/16 138 lb (62.6 kg)  04/12/16 138 lb (62.6 kg)  03/22/16 151 lb 9.6 oz (68.8 kg)   Temp Readings from Last 3 Encounters:  04/05/16 97.8 F (36.6 C) (Oral)  03/22/16 (!) 93 F (33.9 C) (Oral)   BP Readings from Last 3 Encounters:    04/12/16 109/73  04/12/16 109/73  04/05/16 91/66   Pulse Readings from Last 3 Encounters:  04/12/16 77  04/12/16 77  04/05/16 93    Pain Scale 9/10 In general this is a chronically ill appearing African American female in no acute distress. Presents in hospital bed, states she is ambulatory with the aid of crutches. She is alert and oriented x4 and appropriate throughout the examination. HEENT reveals that the patient is normocephalic, atraumatic. EOMs are intact. PERRLA. Skin is intact without any evidence of gross lesions. Cardiopulmonary assessment is negative for acute distress and she exhibits normal effort. Lower extremities are negative for pretibial pitting edema, deep calf tenderness, cyanosis or clubbing. She has intact sensation to light touch in bilateral lower extremities without motor function deficits.   KPS = 40  100 - Normal; no complaints; no evidence of disease. 90   - Able to carry on normal activity; minor signs or symptoms of disease. 80   - Normal activity with effort; some signs or symptoms of disease. 66   - Cares for self; unable to carry on normal activity or to do active work. 60   - Requires occasional assistance, but is able to care for most of his personal needs. 50   - Requires considerable assistance and frequent medical care. 67   - Disabled; requires special care and assistance. 74   - Severely disabled; hospital admission is indicated although death not imminent. 49   - Very sick; hospital admission necessary; active supportive treatment necessary. 10   - Moribund; fatal processes progressing rapidly. 0     - Dead  Karnofsky DA, Abelmann [place], Craver LS and Burchenal [facility] [phone]) The use of the nitrogen mustards in the palliative treatment of carcinoma: with particular reference to bronchogenic carcinoma Cancer 1 634-56  LABORATORY DATA:  Lab Results  Component Value Date   WBC 4.0 04/12/2016   HGB 13.2 04/12/2016   HCT 39.4 04/12/2016   MCV 87.2  04/12/2016   PLT 298 04/12/2016   Lab Results  Component Value Date   NA 135 (L) 04/12/2016   K 3.6 04/12/2016   CL 111 03/08/2015   CO2 17 (L) 04/12/2016   Lab Results  Component Value Date   ALT 10 04/12/2016  AST 14 04/12/2016   ALKPHOS 76 04/12/2016   BILITOT 0.87 04/12/2016     RADIOGRAPHY: Ct Chest W Contrast  Result Date: 04/12/2016 CLINICAL DATA:  45 year old female with history of hematemesis intermittently since November 2017. Undergoing ongoing chemotherapy and radiation therapy for non-small cell carcinoma of the right lung with known metastatic disease to the pelvis. EXAM: CT CHEST, ABDOMEN, AND PELVIS WITH CONTRAST TECHNIQUE: Multidetector CT imaging of the chest, abdomen and pelvis was performed following the standard protocol during bolus administration of intravenous contrast. CONTRAST:  133m ISOVUE-300 IOPAMIDOL (ISOVUE-300) INJECTION 61%, 3100mISOVUE-300 IOPAMIDOL (ISOVUE-300) INJECTION 61% COMPARISON:  CT the chest, abdomen and pelvis 03/02/2016. FINDINGS: CT CHEST FINDINGS Cardiovascular: Heart size is normal. There is no significant pericardial fluid, thickening or pericardial calcification. Right internal jugular single-lumen porta cath with tip terminating in the right atrium. No atherosclerotic calcifications in the thoracic aorta or the coronary arteries. Mediastinum/Nodes: No pathologically enlarged mediastinal or hilar lymph nodes. Esophagus is unremarkable in appearance. No axillary lymphadenopathy. Lungs/Pleura: Centrally located infrahilar nodule in knee right lower lobe is similar to the prior study, measuring 19 x 13 mm (image 67 of series 4) on today's examination. No other suspicious appearing pulmonary nodules or masses are noted. Small calcified granulomas are also noted in the right middle and upper lobes. No acute consolidative airspace disease. No pleural effusions. Mild diffuse bronchial wall thickening with mild centrilobular emphysema.  Musculoskeletal: There are no aggressive appearing lytic or blastic lesions noted in the visualized portions of the skeleton. CT ABDOMEN PELVIS FINDINGS Hepatobiliary: There are no cystic or solid hepatic lesions. No intra or extrahepatic biliary ductal dilatation. Gallbladder is normal in appearance. Pancreas: No pancreatic mass. No pancreatic ductal dilatation. No pancreatic or peripancreatic fluid or inflammatory changes. Spleen: Unremarkable. Adrenals/Urinary Tract: Bilateral adrenal metastasis appears slightly smaller than the prior examination, measuring up to 2.3 x 1.6 cm on the right and 3.4 x 1.7 cm on the left on today's examination. 1.6 cm simple cyst in the posterior aspect of the interpolar region of the right kidney. Subcentimeter low-attenuation lesions in the kidneys bilaterally are too small to definitively characterize, but are statistically likely to represent tiny cysts. No hydroureteronephrosis. Urinary bladder is normal in appearance. Stomach/Bowel: The appearance of the stomach is normal. There is no pathologic dilatation of small bowel or colon. Normal appendix. Vascular/Lymphatic: Aortic atherosclerosis, without evidence of aneurysm or dissection in the abdominal or pelvic vasculature. No lymphadenopathy noted in the abdomen or pelvis. Reproductive: Uterus and ovaries are unremarkable in appearance. Other: No significant volume of ascites.  No pneumoperitoneum. Musculoskeletal: Mixed lytic and sclerotic lesion in the posterior aspect of the left ilium just adjacent to the sacroiliac joint is similar to prior examinations measuring approximately 3.1 x 2.1 cm (axial image 94 of series 2). Relatively ill-defined lytic lesion in the lateral aspect of the sacrum adjacent to the left sacroiliac joint corresponding to recently recognized metastatic lesion on prior MRI pelvis 03/15/2016. No other definite suspicious appearing lytic rule blastic osseous lesions are noted in the visualized portions of  the skeleton. IMPRESSION: 1. Stable appearance of right lower lobe nodule. No mediastinal or hilar lymphadenopathy at this time. 2. Decreased size of bilateral adrenal metastases, indicative of a positive response to therapy. 3. Similar appearance of osseous metastasis in the bony pelvis, as discussed above. 4. No new metastatic lesions. 5. Aortic atherosclerosis. Electronically Signed   By: DaVinnie Langton.D.   On: 04/12/2016 14:43   Mr Hip Left W Wo  Contrast  Result Date: 03/15/2016 CLINICAL DATA:  Small cell lung cancer. Known metastatic disease. Left-sided hip pain. EXAM: MRI OF THE LEFT HIP WITHOUT AND WITH CONTRAST TECHNIQUE: Multiplanar, multisequence MR imaging was performed both before and after administration of intravenous contrast. CONTRAST:  23m MULTIHANCE GADOBENATE DIMEGLUMINE 529 MG/ML IV SOLN COMPARISON:  MR pelvis 12/14/2014 FINDINGS: Bones: No hip fracture, dislocation or avascular necrosis. No SI joint widening or erosive changes. 2.1 x 2.5 x 3 cm bone lesion in the posterior left ilium with surrounding edema most consistent with metastatic disease. Bone lesion in the adjacent left posterior sacrum measuring 2.5 x 1.5 cm. Abnormal bone lesion in the left posterior ilium which extends to the posterior margin of the left S1 foramen with perineural enhancement concerning for left S1 perineural metastatic disease. Severe soft tissue edema with enhancement in the adjacent left multifidus muscle concerning for extension of metastatic disease. Heterogeneous marrow signal in the right sacrum and ilium likely reflect red marrow. Degenerative disc disease with disc height loss at L5-S1. Articular cartilage and labrum Articular cartilage:  No chondral defect. Labrum: Grossly intact, but evaluation is limited by lack of intraarticular fluid. Joint or bursal effusion Joint effusion:  No hip joint effusion.  No SI joint effusion. Bursae:  No bursa formation. Muscles and tendons Flexors: Normal.  Extensors: Normal. Abductors: Normal. Adductors: Normal. Rotators: Normal. Hamstrings: Normal. Other findings Miscellaneous: No fluid collection or hematoma. No inguinal hernia. No inguinal lymphadenopathy. IMPRESSION: 1. 2.1 x 2.5 x 3 cm bone lesion in the posterior left ilium with surrounding edema most consistent with metastatic disease. Bone lesion in the adjacent left posterior sacrum measuring 2.5 x 1.5 cm. Severe soft tissue edema with enhancement in the adjacent left multifidus muscle concerning for extension of metastatic disease. 2. Abnormal bone lesion in the left posterior ilium which extends to the posterior margin of the left S1 foramen with perineural enhancement concerning for left S1 perineural metastatic disease. Findings most consistent with progression of disease. Electronically Signed   By: HKathreen Devoid  On: 03/15/2016 14:45   Ct Abdomen Pelvis W Contrast  Result Date: 04/12/2016 CLINICAL DATA:  45year old female with history of hematemesis intermittently since November 2017. Undergoing ongoing chemotherapy and radiation therapy for non-small cell carcinoma of the right lung with known metastatic disease to the pelvis. EXAM: CT CHEST, ABDOMEN, AND PELVIS WITH CONTRAST TECHNIQUE: Multidetector CT imaging of the chest, abdomen and pelvis was performed following the standard protocol during bolus administration of intravenous contrast. CONTRAST:  1065mISOVUE-300 IOPAMIDOL (ISOVUE-300) INJECTION 61%, 3060mSOVUE-300 IOPAMIDOL (ISOVUE-300) INJECTION 61% COMPARISON:  CT the chest, abdomen and pelvis 03/02/2016. FINDINGS: CT CHEST FINDINGS Cardiovascular: Heart size is normal. There is no significant pericardial fluid, thickening or pericardial calcification. Right internal jugular single-lumen porta cath with tip terminating in the right atrium. No atherosclerotic calcifications in the thoracic aorta or the coronary arteries. Mediastinum/Nodes: No pathologically enlarged mediastinal or hilar  lymph nodes. Esophagus is unremarkable in appearance. No axillary lymphadenopathy. Lungs/Pleura: Centrally located infrahilar nodule in knee right lower lobe is similar to the prior study, measuring 19 x 13 mm (image 67 of series 4) on today's examination. No other suspicious appearing pulmonary nodules or masses are noted. Small calcified granulomas are also noted in the right middle and upper lobes. No acute consolidative airspace disease. No pleural effusions. Mild diffuse bronchial wall thickening with mild centrilobular emphysema. Musculoskeletal: There are no aggressive appearing lytic or blastic lesions noted in the visualized portions of the skeleton.  CT ABDOMEN PELVIS FINDINGS Hepatobiliary: There are no cystic or solid hepatic lesions. No intra or extrahepatic biliary ductal dilatation. Gallbladder is normal in appearance. Pancreas: No pancreatic mass. No pancreatic ductal dilatation. No pancreatic or peripancreatic fluid or inflammatory changes. Spleen: Unremarkable. Adrenals/Urinary Tract: Bilateral adrenal metastasis appears slightly smaller than the prior examination, measuring up to 2.3 x 1.6 cm on the right and 3.4 x 1.7 cm on the left on today's examination. 1.6 cm simple cyst in the posterior aspect of the interpolar region of the right kidney. Subcentimeter low-attenuation lesions in the kidneys bilaterally are too small to definitively characterize, but are statistically likely to represent tiny cysts. No hydroureteronephrosis. Urinary bladder is normal in appearance. Stomach/Bowel: The appearance of the stomach is normal. There is no pathologic dilatation of small bowel or colon. Normal appendix. Vascular/Lymphatic: Aortic atherosclerosis, without evidence of aneurysm or dissection in the abdominal or pelvic vasculature. No lymphadenopathy noted in the abdomen or pelvis. Reproductive: Uterus and ovaries are unremarkable in appearance. Other: No significant volume of ascites.  No  pneumoperitoneum. Musculoskeletal: Mixed lytic and sclerotic lesion in the posterior aspect of the left ilium just adjacent to the sacroiliac joint is similar to prior examinations measuring approximately 3.1 x 2.1 cm (axial image 94 of series 2). Relatively ill-defined lytic lesion in the lateral aspect of the sacrum adjacent to the left sacroiliac joint corresponding to recently recognized metastatic lesion on prior MRI pelvis 03/15/2016. No other definite suspicious appearing lytic rule blastic osseous lesions are noted in the visualized portions of the skeleton. IMPRESSION: 1. Stable appearance of right lower lobe nodule. No mediastinal or hilar lymphadenopathy at this time. 2. Decreased size of bilateral adrenal metastases, indicative of a positive response to therapy. 3. Similar appearance of osseous metastasis in the bony pelvis, as discussed above. 4. No new metastatic lesions. 5. Aortic atherosclerosis. Electronically Signed   By: Vinnie Langton M.D.   On: 04/12/2016 14:43      IMPRESSION/PLAN: 1. 45 y.o. woman with progressive metastatic stage IV, T1a, N2, M1b NSCLC, adenocarcinoma of the right hilum to the left ilium and sacrum. Today, we talked to the patient and family about the findings and work-up thus far.  We discussed the natural history of painful bone metastases and general treatment, highlighting the role of palliative radiotherapy in the management of her disease. We discussed the available palliative radiation techniques, and focused on the details of logistics and delivery, and Dr. Tammi Klippel has recommended a course of 10 fractions to the sacrum and left iliac bone.  We reviewed the anticipated acute and late sequelae associated with palliative radiation in this setting. She has signed written consent to proceed. The patient would like to proceed with palliative radiation and has been scheduled for CT simulation later today.   The above documentation reflects my direct findings during  this shared patient visit. Please see the separate note by Dr. Tammi Klippel on this date for the remainder of the patient's plan of care.   Carola Rhine, PAC  This document serves as a record of services personally performed by Tyler Pita, MD and Shona Simpson, PA-C. It was created on their behalf by Arlyce Harman, a trained medical scribe. The creation of this record is based on the scribe's personal observations and the provider's statements to them. This document has been checked and approved by the attending provider.

## 2016-04-12 NOTE — Progress Notes (Signed)
See progress note under physician encounter. 

## 2016-04-13 ENCOUNTER — Telehealth: Payer: Self-pay | Admitting: Medical Oncology

## 2016-04-13 DIAGNOSIS — C7951 Secondary malignant neoplasm of bone: Secondary | ICD-10-CM | POA: Diagnosis not present

## 2016-04-13 NOTE — Telephone Encounter (Signed)
Returned call and told pt the scans are being pushed to [employer]. Dr Lawson Fiscal in room with pt.

## 2016-04-14 ENCOUNTER — Encounter: Payer: Self-pay | Admitting: Nurse Practitioner

## 2016-04-14 ENCOUNTER — Ambulatory Visit: Payer: Self-pay

## 2016-04-19 ENCOUNTER — Ambulatory Visit: Payer: Medicaid Other

## 2016-04-19 ENCOUNTER — Ambulatory Visit (HOSPITAL_BASED_OUTPATIENT_CLINIC_OR_DEPARTMENT_OTHER): Payer: Medicaid Other

## 2016-04-19 ENCOUNTER — Other Ambulatory Visit (HOSPITAL_BASED_OUTPATIENT_CLINIC_OR_DEPARTMENT_OTHER): Payer: Medicaid Other

## 2016-04-19 ENCOUNTER — Ambulatory Visit
Admission: RE | Admit: 2016-04-19 | Discharge: 2016-04-19 | Disposition: A | Discharge status: Home / Self Care | Payer: Medicaid Other | Source: Ambulatory Visit | Attending: Radiation Oncology | Admitting: Radiation Oncology

## 2016-04-19 ENCOUNTER — Other Ambulatory Visit: Payer: Self-pay | Admitting: Emergency Medicine

## 2016-04-19 VITALS — HR 56 | Temp 98.8°F | Resp 20

## 2016-04-19 DIAGNOSIS — M898X9 Other specified disorders of bone, unspecified site: Secondary | ICD-10-CM

## 2016-04-19 DIAGNOSIS — C3491 Malignant neoplasm of unspecified part of right bronchus or lung: Secondary | ICD-10-CM | POA: Diagnosis not present

## 2016-04-19 DIAGNOSIS — Z95828 Presence of other vascular implants and grafts: Secondary | ICD-10-CM

## 2016-04-19 DIAGNOSIS — C7951 Secondary malignant neoplasm of bone: Secondary | ICD-10-CM | POA: Diagnosis not present

## 2016-04-19 LAB — CBC WITH DIFFERENTIAL/PLATELET
BASO%: 0.5 % (ref 0.0–2.0)
Basophils Absolute: 0 10*3/uL (ref 0.0–0.1)
EOS ABS: 0.1 10*3/uL (ref 0.0–0.5)
EOS%: 3.1 % (ref 0.0–7.0)
HEMATOCRIT: 34.8 % (ref 34.8–46.6)
HEMOGLOBIN: 11.3 g/dL — AB (ref 11.6–15.9)
LYMPH#: 0.9 10*3/uL (ref 0.9–3.3)
LYMPH%: 21.7 % (ref 14.0–49.7)
MCH: 29.6 pg (ref 25.1–34.0)
MCHC: 32.5 g/dL (ref 31.5–36.0)
MCV: 91.1 fL (ref 79.5–101.0)
MONO#: 0.4 10*3/uL (ref 0.1–0.9)
MONO%: 10.3 % (ref 0.0–14.0)
NEUT%: 64.4 % (ref 38.4–76.8)
NEUTROS ABS: 2.7 10*3/uL (ref 1.5–6.5)
Platelets: 263 10*3/uL (ref 145–400)
RBC: 3.82 10*6/uL (ref 3.70–5.45)
RDW: 21.5 % — AB (ref 11.2–14.5)
WBC: 4.2 10*3/uL (ref 3.9–10.3)

## 2016-04-19 LAB — COMPREHENSIVE METABOLIC PANEL
ALBUMIN: 3 g/dL — AB (ref 3.5–5.0)
ALK PHOS: 49 U/L (ref 40–150)
ALT: 15 U/L (ref 0–55)
AST: 15 U/L (ref 5–34)
Anion Gap: 6 mEq/L (ref 3–11)
BILIRUBIN TOTAL: 0.43 mg/dL (ref 0.20–1.20)
BUN: 7.9 mg/dL (ref 7.0–26.0)
CALCIUM: 7.8 mg/dL — AB (ref 8.4–10.4)
CO2: 24 mEq/L (ref 22–29)
Chloride: 108 mEq/L (ref 98–109)
Creatinine: 0.6 mg/dL (ref 0.6–1.1)
EGFR: >90 mL/min/{1.73_m2} (ref >90)
GLUCOSE: 96 mg/dL (ref 70–140)
Potassium: 3.1 mEq/L — ABNORMAL LOW (ref 3.5–5.1)
SODIUM: 137 meq/L (ref 136–145)
TOTAL PROTEIN: 5.6 g/dL — AB (ref 6.4–8.3)

## 2016-04-19 MED ORDER — HEPARIN SOD (PORK) LOCK FLUSH 100 UNIT/ML IV SOLN
500.0000 [IU] | Freq: Once | INTRAVENOUS | Status: AC | PRN
  Administered 2016-04-19: 500 [IU] via INTRAVENOUS
  Filled 2016-04-19: qty 5

## 2016-04-19 MED ORDER — DENOSUMAB 120 MG/1.7ML ~~LOC~~ SOLN
120.0000 mg | Freq: Once | SUBCUTANEOUS | Status: AC
  Administered 2016-04-19: 120 mg via SUBCUTANEOUS
  Filled 2016-04-19: qty 1.7

## 2016-04-19 MED ORDER — SODIUM CHLORIDE 0.9 % IJ SOLN
10.0000 mL | INTRAMUSCULAR | Status: DC | PRN
  Administered 2016-04-19 (×2): 10 mL via INTRAVENOUS
  Filled 2016-04-19: qty 10

## 2016-04-19 MED ORDER — OXYCODONE-ACETAMINOPHEN 10-325 MG PO TABS: 1.0000 | ORAL_TABLET | ORAL | 0 refills | Status: DC | PRN

## 2016-04-19 MED FILL — OXYCODONE-APAP 10-325: 10-325 | 7 days supply | Qty: 45 | Fill #0

## 2016-04-19 MED FILL — fentaNYL 12 MCG/HR PT72: 12 | 15 days supply | Qty: 5 | Fill #0

## 2016-04-19 NOTE — Patient Instructions (Signed)

## 2016-04-20 ENCOUNTER — Ambulatory Visit
Admission: RE | Admit: 2016-04-20 | Discharge: 2016-04-20 | Disposition: A | Discharge status: Home / Self Care | Payer: Medicaid Other | Source: Ambulatory Visit | Attending: Radiation Oncology | Admitting: Radiation Oncology

## 2016-04-20 DIAGNOSIS — C7951 Secondary malignant neoplasm of bone: Secondary | ICD-10-CM | POA: Diagnosis not present

## 2016-04-21 ENCOUNTER — Ambulatory Visit
Admission: RE | Admit: 2016-04-21 | Discharge: 2016-04-21 | Disposition: A | Discharge status: Home / Self Care | Payer: Medicaid Other | Source: Ambulatory Visit | Attending: Radiation Oncology | Admitting: Radiation Oncology

## 2016-04-21 VITALS — BP 101/58 | HR 85 | Temp 97.7°F | Resp 16 | Ht 66.0 in

## 2016-04-21 DIAGNOSIS — C7951 Secondary malignant neoplasm of bone: Secondary | ICD-10-CM | POA: Insufficient documentation

## 2016-04-21 DIAGNOSIS — C3491 Malignant neoplasm of unspecified part of right bronchus or lung: Secondary | ICD-10-CM | POA: Diagnosis not present

## 2016-04-21 MED ORDER — BIAFINE EX EMUL
Freq: Two times a day (BID) | CUTANEOUS | Status: DC
  Administered 2016-04-21: 16:00:00 via TOPICAL

## 2016-04-21 NOTE — Progress Notes (Signed)
Miss Andrea [name] has received 3 radiation treatments to her left hip.  Skin to left hip hyperpigmentation.  Given biafine with instructions on how to use bid.   Patient teaching review today Reviewed areas of pertinence such as diarrhea and fatigue,nausea,vomiting,skin changes, sexual and fertility changes and urinary and bladder changes. Pt able to give teach back.  Asked to drink plenty of water.  Pt demonstrated understanding of information given and will contact nursing with any questions or concerns.  Did not want to watch the radiation again saw it in the past.   Reports fatigue,pain 6/10 taking Oxycodone.  Denies nausea,vomoting, diarrhea,urinary bladder changes or sexual changes. Wt Readings from Last 3 Encounters:  04/12/16 138 lb (62.6 kg)  04/12/16 138 lb (62.6 kg)  03/22/16 151 lb 9.6 oz (68.8 kg)  BP (!) 101/58   Pulse 85   Temp 97.7 F (36.5 C) (Oral)   Resp 16   Ht '5\' 6"'$  (1.676 m)   SpO2 100%

## 2016-04-21 NOTE — Progress Notes (Signed)
  Radiation Oncology         [phone]   Name: Andrea [name] MRN: [id number]   Date: 04/21/2016  DOB: 08-01-1970   Weekly Radiation Therapy Management    ICD-9-CM ICD-10-CM   1. Non-small cell carcinoma of right lung, stage 4 (HCC) 162.9 C34.91 topical emolient (BIAFINE) emulsion  2. Bone metastases (HCC) 198.5 C79.51     Current Dose: 4 Gy  Planned Dose:  30 Gy  Narrative The patient presents for routine under treatment assessment.  The patient has received 3 fractions to her left hip. She reports fatigue. Pain 6/10 in severity, and the patient is taking Oxycodone to manage. She denies nausea, vomiting, bladder concerns, or sexual changes.  The patient is without complaint. Set-up films were reviewed. The chart was checked.  Physical Findings  height is '5\' 6"'$  (1.676 m). Her oral temperature is 97.7 F (36.5 C). Her blood pressure is 101/58 (abnormal) and her pulse is 85. Her respiration is 16 and oxygen saturation is 100%.  Weight essentially stable. No significant changes.  Impression The patient is tolerating radiation. The patient is in a hospital bed.  Plan Continue treatment as planned.        ------------------------------------------------  Jodelle Gross, MD, PhD  This document serves as a record of services personally performed by Kyung Rudd, MD. It was created on his behalf by Maryla Morrow, a trained medical scribe. The creation of this record is based on the scribe's personal observations and the provider's statements to them. This document has been checked and approved by the attending provider.

## 2016-04-25 ENCOUNTER — Ambulatory Visit
Admission: RE | Admit: 2016-04-25 | Discharge: 2016-04-25 | Disposition: A | Discharge status: Home / Self Care | Payer: Medicaid Other | Source: Ambulatory Visit | Attending: Radiation Oncology | Admitting: Radiation Oncology

## 2016-04-25 ENCOUNTER — Telehealth: Payer: Self-pay | Admitting: *Deleted

## 2016-04-25 ENCOUNTER — Telehealth: Payer: Self-pay | Admitting: Medical Oncology

## 2016-04-25 DIAGNOSIS — C7951 Secondary malignant neoplasm of bone: Secondary | ICD-10-CM | POA: Diagnosis not present

## 2016-04-25 NOTE — Telephone Encounter (Signed)
Case manager from [facility], calling states pt says Dr. Julien Nordmann needs information/records from [facility] in order to facilitate Nurse Aide services in the home.  I gave her the fax number for Dr. Worthy Flank nurse to fax records and transferred her to Dr. Worthy Flank nurse line.

## 2016-04-25 NOTE — Telephone Encounter (Addendum)
Pt called Tonia Ghent ( Tourist information centre manager at [employer]) today and trying to get additional home health services . Pt has an aide that comes out for 2 hours and she states she needs more hours. She told Tonia Ghent that Dr Julien Nordmann needs information from then for home health. Ida told pt she needs to contact PCP for more home health services.

## 2016-04-26 ENCOUNTER — Other Ambulatory Visit (HOSPITAL_BASED_OUTPATIENT_CLINIC_OR_DEPARTMENT_OTHER): Payer: Medicaid Other

## 2016-04-26 ENCOUNTER — Ambulatory Visit
Admission: RE | Admit: 2016-04-26 | Discharge: 2016-04-26 | Disposition: A | Discharge status: Home / Self Care | Payer: Medicaid Other | Source: Ambulatory Visit | Attending: Radiation Oncology | Admitting: Radiation Oncology

## 2016-04-26 ENCOUNTER — Ambulatory Visit (HOSPITAL_BASED_OUTPATIENT_CLINIC_OR_DEPARTMENT_OTHER): Payer: Medicaid Other

## 2016-04-26 ENCOUNTER — Ambulatory Visit: Payer: Self-pay | Admitting: Internal Medicine

## 2016-04-26 VITALS — BP 92/59 | HR 78 | Temp 98.0°F | Resp 18

## 2016-04-26 DIAGNOSIS — C3491 Malignant neoplasm of unspecified part of right bronchus or lung: Secondary | ICD-10-CM

## 2016-04-26 DIAGNOSIS — C7951 Secondary malignant neoplasm of bone: Secondary | ICD-10-CM | POA: Diagnosis not present

## 2016-04-26 DIAGNOSIS — M898X9 Other specified disorders of bone, unspecified site: Secondary | ICD-10-CM

## 2016-04-26 DIAGNOSIS — Z95828 Presence of other vascular implants and grafts: Secondary | ICD-10-CM

## 2016-04-26 LAB — COMPREHENSIVE METABOLIC PANEL
ALBUMIN: 3 g/dL — AB (ref 3.5–5.0)
ALK PHOS: 50 U/L (ref 40–150)
ALT: 12 U/L (ref 0–55)
AST: 14 U/L (ref 5–34)
Anion Gap: 8 mEq/L (ref 3–11)
BUN: 10.2 mg/dL (ref 7.0–26.0)
CO2: 21 mEq/L — ABNORMAL LOW (ref 22–29)
Calcium: 8.5 mg/dL (ref 8.4–10.4)
Chloride: 114 mEq/L — ABNORMAL HIGH (ref 98–109)
Creatinine: 0.6 mg/dL (ref 0.6–1.1)
EGFR: >90 mL/min/{1.73_m2} (ref >90)
GLUCOSE: 91 mg/dL (ref 70–140)
POTASSIUM: 3.2 meq/L — AB (ref 3.5–5.1)
SODIUM: 143 meq/L (ref 136–145)
Total Bilirubin: 0.24 mg/dL (ref 0.20–1.20)
Total Protein: 5.6 g/dL — ABNORMAL LOW (ref 6.4–8.3)

## 2016-04-26 LAB — CBC WITH DIFFERENTIAL/PLATELET
BASO%: 0.8 % (ref 0.0–2.0)
BASOS ABS: 0 10*3/uL (ref 0.0–0.1)
EOS%: 2.3 % (ref 0.0–7.0)
Eosinophils Absolute: 0.1 10*3/uL (ref 0.0–0.5)
HCT: 33.1 % — ABNORMAL LOW (ref 34.8–46.6)
HEMOGLOBIN: 10.8 g/dL — AB (ref 11.6–15.9)
LYMPH%: 21 % (ref 14.0–49.7)
MCH: 30.5 pg (ref 25.1–34.0)
MCHC: 32.6 g/dL (ref 31.5–36.0)
MCV: 93.5 fL (ref 79.5–101.0)
MONO#: 0.5 10*3/uL (ref 0.1–0.9)
MONO%: 12.5 % (ref 0.0–14.0)
NEUT#: 2.5 10*3/uL (ref 1.5–6.5)
NEUT%: 63.4 % (ref 38.4–76.8)
Platelets: 274 10*3/uL (ref 145–400)
RBC: 3.54 10*6/uL — ABNORMAL LOW (ref 3.70–5.45)
RDW: 21.4 % — AB (ref 11.2–14.5)
WBC: 4 10*3/uL (ref 3.9–10.3)
lymph#: 0.8 10*3/uL — ABNORMAL LOW (ref 0.9–3.3)

## 2016-04-26 MED ORDER — HEPARIN SOD (PORK) LOCK FLUSH 100 UNIT/ML IV SOLN
500.0000 [IU] | Freq: Once | INTRAVENOUS | Status: AC | PRN
  Administered 2016-04-26: 500 [IU] via INTRAVENOUS
  Filled 2016-04-26: qty 5

## 2016-04-26 MED ORDER — SODIUM CHLORIDE 0.9 % IJ SOLN
10.0000 mL | INTRAMUSCULAR | Status: DC | PRN
  Administered 2016-04-26: 10 mL via INTRAVENOUS
  Filled 2016-04-26: qty 10

## 2016-04-26 NOTE — Patient Instructions (Signed)

## 2016-04-27 ENCOUNTER — Ambulatory Visit
Admission: RE | Admit: 2016-04-27 | Discharge: 2016-04-27 | Disposition: A | Discharge status: Home / Self Care | Payer: Medicaid Other | Source: Ambulatory Visit | Attending: Radiation Oncology | Admitting: Radiation Oncology

## 2016-04-27 ENCOUNTER — Encounter: Payer: Self-pay | Admitting: Internal Medicine

## 2016-04-27 DIAGNOSIS — F1120 Opioid dependence, uncomplicated: Secondary | ICD-10-CM | POA: Insufficient documentation

## 2016-04-27 DIAGNOSIS — C7951 Secondary malignant neoplasm of bone: Secondary | ICD-10-CM | POA: Diagnosis not present

## 2016-04-27 NOTE — Progress Notes (Signed)
Medication reconciliation updated.

## 2016-04-28 ENCOUNTER — Ambulatory Visit
Admission: RE | Admit: 2016-04-28 | Discharge: 2016-04-28 | Disposition: A | Discharge status: Home / Self Care | Payer: Medicaid Other | Source: Ambulatory Visit | Attending: Radiation Oncology | Admitting: Radiation Oncology

## 2016-04-28 DIAGNOSIS — C7951 Secondary malignant neoplasm of bone: Secondary | ICD-10-CM

## 2016-04-28 NOTE — Progress Notes (Signed)
  Radiation Oncology         [phone]   Name: Andrea [name] MRN: [id number]   Date: 04/28/2016  DOB: 04-14-1971   Weekly Radiation Therapy Management    ICD-9-CM ICD-10-CM   1. Bone metastases (HCC) 198.5 C79.51     Current Dose: 21 Gy  Planned Dose:  30 Gy  Narrative The patient presents for routine under treatment assessment.  The patient was seen during treatment today. Pain is slightly better, and the patient denies any side effects from radiation.  The patient is without complaint. Set-up films were reviewed. The chart was checked.  Physical Findings  Weight essentially stable.  No significant changes.  Impression The patient is tolerating radiation.  Plan Continue treatment as planned.         Sheral Apley Tammi Klippel, M.D.  This document serves as a record of services personally performed by Tyler Pita, MD. It was created on his behalf by Maryla Morrow, a trained medical scribe. The creation of this record is based on the scribe's personal observations and the provider's statements to them. This document has been checked and approved by the attending provider.

## 2016-05-01 ENCOUNTER — Telehealth: Payer: Self-pay | Admitting: Medical Oncology

## 2016-05-01 ENCOUNTER — Ambulatory Visit
Admission: RE | Admit: 2016-05-01 | Discharge: 2016-05-01 | Disposition: A | Discharge status: Home / Self Care | Payer: Medicaid Other | Source: Ambulatory Visit | Attending: Radiation Oncology | Admitting: Radiation Oncology

## 2016-05-01 DIAGNOSIS — C7951 Secondary malignant neoplasm of bone: Secondary | ICD-10-CM | POA: Diagnosis not present

## 2016-05-01 NOTE — Telephone Encounter (Signed)
Faxed signed form for request for additional services.

## 2016-05-02 ENCOUNTER — Ambulatory Visit
Admission: RE | Admit: 2016-05-02 | Discharge: 2016-05-02 | Disposition: A | Discharge status: Home / Self Care | Payer: Medicaid Other | Source: Ambulatory Visit | Attending: Radiation Oncology | Admitting: Radiation Oncology

## 2016-05-02 ENCOUNTER — Telehealth: Payer: Self-pay

## 2016-05-02 DIAGNOSIS — C7951 Secondary malignant neoplasm of bone: Secondary | ICD-10-CM | POA: Diagnosis not present

## 2016-05-02 NOTE — Telephone Encounter (Signed)
Asking to speak with Andrea [name]. Please call pt back.  

## 2016-05-03 ENCOUNTER — Telehealth: Payer: Self-pay | Admitting: Internal Medicine

## 2016-05-03 ENCOUNTER — Ambulatory Visit (HOSPITAL_BASED_OUTPATIENT_CLINIC_OR_DEPARTMENT_OTHER): Payer: Medicaid Other | Admitting: Internal Medicine

## 2016-05-03 ENCOUNTER — Encounter: Payer: Self-pay | Admitting: Internal Medicine

## 2016-05-03 ENCOUNTER — Other Ambulatory Visit (HOSPITAL_BASED_OUTPATIENT_CLINIC_OR_DEPARTMENT_OTHER): Payer: Medicaid Other

## 2016-05-03 ENCOUNTER — Other Ambulatory Visit: Payer: Self-pay | Admitting: Medical Oncology

## 2016-05-03 ENCOUNTER — Encounter: Payer: Self-pay | Admitting: Radiation Oncology

## 2016-05-03 ENCOUNTER — Ambulatory Visit (HOSPITAL_BASED_OUTPATIENT_CLINIC_OR_DEPARTMENT_OTHER): Payer: Medicaid Other

## 2016-05-03 ENCOUNTER — Ambulatory Visit
Admission: RE | Admit: 2016-05-03 | Discharge: 2016-05-03 | Disposition: A | Discharge status: Home / Self Care | Payer: Medicaid Other | Source: Ambulatory Visit | Attending: Radiation Oncology | Admitting: Radiation Oncology

## 2016-05-03 VITALS — BP 92/78 | HR 106 | Temp 98.0°F | Resp 18

## 2016-05-03 VITALS — BP 98/77 | HR 104 | Temp 97.7°F | Resp 18 | Wt 136.0 lb

## 2016-05-03 DIAGNOSIS — C7951 Secondary malignant neoplasm of bone: Secondary | ICD-10-CM

## 2016-05-03 DIAGNOSIS — Z7189 Other specified counseling: Secondary | ICD-10-CM

## 2016-05-03 DIAGNOSIS — Z5112 Encounter for antineoplastic immunotherapy: Secondary | ICD-10-CM

## 2016-05-03 DIAGNOSIS — C3491 Malignant neoplasm of unspecified part of right bronchus or lung: Secondary | ICD-10-CM

## 2016-05-03 DIAGNOSIS — R2242 Localized swelling, mass and lump, left lower limb: Secondary | ICD-10-CM

## 2016-05-03 DIAGNOSIS — G893 Neoplasm related pain (acute) (chronic): Secondary | ICD-10-CM | POA: Diagnosis not present

## 2016-05-03 DIAGNOSIS — C7931 Secondary malignant neoplasm of brain: Secondary | ICD-10-CM | POA: Diagnosis not present

## 2016-05-03 DIAGNOSIS — Z5111 Encounter for antineoplastic chemotherapy: Secondary | ICD-10-CM

## 2016-05-03 DIAGNOSIS — R112 Nausea with vomiting, unspecified: Secondary | ICD-10-CM | POA: Insufficient documentation

## 2016-05-03 DIAGNOSIS — C3490 Malignant neoplasm of unspecified part of unspecified bronchus or lung: Secondary | ICD-10-CM

## 2016-05-03 DIAGNOSIS — Z5189 Encounter for other specified aftercare: Secondary | ICD-10-CM | POA: Diagnosis not present

## 2016-05-03 HISTORY — DX: Other specified counseling: Z71.89

## 2016-05-03 LAB — CBC WITH DIFFERENTIAL/PLATELET
BASO%: 0.5 % (ref 0.0–2.0)
Basophils Absolute: 0 10*3/uL (ref 0.0–0.1)
EOS ABS: 0 10*3/uL (ref 0.0–0.5)
EOS%: 0.7 % (ref 0.0–7.0)
HCT: 42.2 % (ref 34.8–46.6)
HGB: 14.2 g/dL (ref 11.6–15.9)
LYMPH%: 14.8 % (ref 14.0–49.7)
MCH: 31.3 pg (ref 25.1–34.0)
MCHC: 33.6 g/dL (ref 31.5–36.0)
MCV: 93.2 fL (ref 79.5–101.0)
MONO#: 0.4 10*3/uL (ref 0.1–0.9)
MONO%: 8.5 % (ref 0.0–14.0)
NEUT%: 75.5 % (ref 38.4–76.8)
NEUTROS ABS: 3.2 10*3/uL (ref 1.5–6.5)
NRBC: 0 % (ref 0–0)
PLATELETS: 354 10*3/uL (ref 145–400)
RBC: 4.53 10*6/uL (ref 3.70–5.45)
RDW: 19.4 % — AB (ref 11.2–14.5)
WBC: 4.3 10*3/uL (ref 3.9–10.3)
lymph#: 0.6 10*3/uL — ABNORMAL LOW (ref 0.9–3.3)

## 2016-05-03 LAB — COMPREHENSIVE METABOLIC PANEL
ALT: 18 U/L (ref 0–55)
ANION GAP: 13 meq/L — AB (ref 3–11)
AST: 15 U/L (ref 5–34)
Albumin: 3.9 g/dL (ref 3.5–5.0)
Alkaline Phosphatase: 72 U/L (ref 40–150)
BUN: 7.4 mg/dL (ref 7.0–26.0)
CHLORIDE: 106 meq/L (ref 98–109)
CO2: 18 meq/L — AB (ref 22–29)
Calcium: 8.4 mg/dL (ref 8.4–10.4)
Creatinine: 0.8 mg/dL (ref 0.6–1.1)
GLUCOSE: 136 mg/dL (ref 70–140)
Potassium: 3.4 mEq/L — ABNORMAL LOW (ref 3.5–5.1)
Sodium: 137 mEq/L (ref 136–145)
TOTAL PROTEIN: 8.3 g/dL (ref 6.4–8.3)
Total Bilirubin: 1.04 mg/dL (ref 0.20–1.20)

## 2016-05-03 MED ORDER — DIPHENHYDRAMINE HCL 25 MG PO CAPS
ORAL_CAPSULE | ORAL | Status: AC
  Filled 2016-05-03: qty 2

## 2016-05-03 MED ORDER — ACETAMINOPHEN 325 MG PO TABS
650.0000 mg | ORAL_TABLET | Freq: Once | ORAL | Status: AC
  Administered 2016-05-03: 650 mg via ORAL

## 2016-05-03 MED ORDER — DIPHENHYDRAMINE HCL 50 MG/ML IJ SOLN
INTRAMUSCULAR | Status: AC
  Filled 2016-05-03: qty 1

## 2016-05-03 MED ORDER — OXYCODONE-ACETAMINOPHEN 10-325 MG PO TABS: 1.0000 | ORAL_TABLET | Freq: Four times a day (QID) | ORAL | 0 refills | Status: DC | PRN

## 2016-05-03 MED ORDER — SODIUM CHLORIDE 0.9 % IV SOLN
10.0000 mg/kg | Freq: Once | INTRAVENOUS | Status: AC
  Administered 2016-05-03: 600 mg via INTRAVENOUS
  Filled 2016-05-03: qty 10

## 2016-05-03 MED ORDER — HYDROMORPHONE HCL 4 MG/ML IJ SOLN
INTRAMUSCULAR | Status: AC
  Filled 2016-05-03: qty 1

## 2016-05-03 MED ORDER — SODIUM CHLORIDE 0.9% FLUSH
10.0000 mL | INTRAVENOUS | Status: DC | PRN
  Administered 2016-05-03: 10 mL
  Filled 2016-05-03: qty 10

## 2016-05-03 MED ORDER — FENTANYL 12 MCG/HR TD PT72: 12.5000 ug | MEDICATED_PATCH | TRANSDERMAL | 0 refills | Status: DC

## 2016-05-03 MED ORDER — ACETAMINOPHEN 325 MG PO TABS
ORAL_TABLET | ORAL | Status: AC
  Filled 2016-05-03: qty 2

## 2016-05-03 MED ORDER — HEPARIN SOD (PORK) LOCK FLUSH 100 UNIT/ML IV SOLN
500.0000 [IU] | Freq: Once | INTRAVENOUS | Status: AC | PRN
  Administered 2016-05-03: 500 [IU]
  Filled 2016-05-03: qty 5

## 2016-05-03 MED ORDER — DEXAMETHASONE SODIUM PHOSPHATE 10 MG/ML IJ SOLN
10.0000 mg | Freq: Once | INTRAMUSCULAR | Status: AC
  Administered 2016-05-03: 10 mg via INTRAVENOUS

## 2016-05-03 MED ORDER — PEGFILGRASTIM 6 MG/0.6ML ~~LOC~~ PSKT
6.0000 mg | PREFILLED_SYRINGE | Freq: Once | SUBCUTANEOUS | Status: AC
  Administered 2016-05-03: 6 mg via SUBCUTANEOUS
  Filled 2016-05-03: qty 0.6

## 2016-05-03 MED ORDER — FENTANYL 50 MCG/HR TD PT72: 50.0000 ug | MEDICATED_PATCH | TRANSDERMAL | 0 refills | Status: DC

## 2016-05-03 MED ORDER — DEXAMETHASONE SODIUM PHOSPHATE 10 MG/ML IJ SOLN
INTRAMUSCULAR | Status: AC
  Filled 2016-05-03: qty 1

## 2016-05-03 MED ORDER — SODIUM CHLORIDE 0.9 % IV SOLN
Freq: Once | INTRAVENOUS | Status: AC
  Administered 2016-05-03: 09:00:00 via INTRAVENOUS

## 2016-05-03 MED ORDER — OLANZAPINE 10 MG PO TABS: 10.0000 mg | ORAL_TABLET | Freq: Every day | ORAL | 0 refills | Status: DC

## 2016-05-03 MED ORDER — AMOXICILLIN 500 MG PO TABS: 500.0000 mg | ORAL_TABLET | Freq: Two times a day (BID) | ORAL | 0 refills | Status: DC

## 2016-05-03 MED ORDER — HYDROMORPHONE HCL 4 MG/ML IJ SOLN
2.0000 mg | INTRAMUSCULAR | Status: AC
  Administered 2016-05-03: 2 mg via INTRAVENOUS

## 2016-05-03 MED ORDER — DIPHENHYDRAMINE HCL 50 MG/ML IJ SOLN
50.0000 mg | Freq: Once | INTRAMUSCULAR | Status: AC
  Administered 2016-05-03: 50 mg via INTRAVENOUS

## 2016-05-03 MED ORDER — DOCETAXEL CHEMO INJECTION 160 MG/16ML
65.0000 mg/m2 | Freq: Once | INTRAVENOUS | Status: AC
  Administered 2016-05-03: 110 mg via INTRAVENOUS
  Filled 2016-05-03: qty 11

## 2016-05-03 NOTE — Progress Notes (Signed)
[facility] [phone]   [phone]  OFFICE PROGRESS NOTE  [name], MD [place] [address]  DIAGNOSIS: Stage IV (T1a, N2, M1 B) non-small cell lung cancer, adenocarcinoma with negative EGFR mutation and presented with a right hilar mass, subcarinal lymphadenopathy as well as a large soft tissue mass in the posterior iliac bone and metastatic bone lesions diagnosed in August 2016.  PRIOR THERAPY: 1) Status post palliative radiotherapy to the soft tissue mass in the left iliac bone under the care of Dr. Valere Dross. 2) Systemic chemotherapy with carboplatin for AUC of 5 and Alimta 500 MG/M2 every 3 weeks. First dose expected on 02/22/2015. Status post 6 cycles. Starting from cycle #3 Alimta was reduced to 400 MG/M2 secondary to neutropenia after the first 2 cycles. 3) Maintenance systemic chemotherapy with single agent Alimta 500 MG/M2 every 3 weeks. First dose 07/19/2015. Status post 3 cycles. Last dose was given 08/30/2015 discontinued secondary to disease progression. 4)  Immunotherapy treatment with Tecentriq (Atezolizumab) 1200 MG IV every 3 weeks, for his dose 10/14/2015. Status post 6 cycles, discontinued secondary to disease progression. 5) whole brain irradiation for multiple new brain metastasis expected to be completed on 02/12/2016  CURRENT THERAPY:  1) Systemic chemotherapy with docetaxel 75 MG/M2 and Cyramza 10 MG/KG every 3 weeks, status post 2 cycles. First dose was given 03/01/2016. 2) Xgeva 120 mg subcutaneously on monthly basis for metastatic bone disease 3) palliative radiotherapy to the progressive bone lesions and the left hip area.  INTERVAL HISTORY: Andrea SCHNOEBELEN 46 y.o. female returns to the clinic today for follow-up visit. The patient continues to complain of pain in the left hip area. She is currently undergoing palliative radiotherapy to this area and she will complete the last fraction of radiotherapy today.  She continues to complain of increasing fatigue and weakness as well as nausea not responding to her current medication with Compazine and Zofran. She also has few episodes of diarrhea. She was seen recently at [facility] by Dr. Durenda Hurt for 2nd opinion. He recommended for her either hospice care versus treatment with single agent chemotherapy. The patient is not interested in hospice and would like to continue with treatment. She has occasional aching pain on the right side of the chest as well as cough. She has some ear ache. She denied having any shortness of breath or hemoptysis. She has been off treatment with docetaxel and Cyramza for the last 3 weeks. She had repeat CT scan of the chest, abdomen and pelvis before her visit to [facility]. She is here today for evaluation before resuming her systemic therapy.  MEDICAL HISTORY: Past Medical History:  Diagnosis Date  . 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 Anginal pain ([place])    chest pain not sure what related to  . Anxiety   . Bone cancer ([place])    lung ca with bone mets  . Chronic back pain   . Depression   . Dysmenorrhea 12/06/2009   Qualifier: Diagnosis of  By: [name] RN, Regino Schultze    . Encounter for antineoplastic immunotherapy 10/07/2015  . Family history of adverse reaction to anesthesia    mother stopped breathing after surgery  . Headache    stress  . Low back pain radiating to left leg 11/29/2010  . Non-small cell carcinoma of right lung, stage 4 ([place]) 01/01/2015  . S/P radiation therapy 01/01/2015 through 01/21/2015     Left iliac bone 3500 cGy  in 14 sessions   . Shortness of breath dyspnea    with exertion, Pt denies 02/2015    ALLERGIES:  is allergic to morphine and related and hydrocodone.  MEDICATIONS:  Current Outpatient Prescriptions  Medication Sig Dispense Refill  . dexamethasone (DECADRON) 4 MG tablet 2 tablet by mouth twice a day the day before, day  of and day after the chemotherapy every 3 weeks 60 tablet 1  . fentaNYL (DURAGESIC - DOSED MCG/HR) 12 MCG/HR Place 12.5 mcg onto the skin every 3 (three) days.    . fentaNYL (DURAGESIC - DOSED MCG/HR) 50 MCG/HR Place 1 patch (50 mcg total) onto the skin every 3 (three) days. 10 patch 0  . levETIRAcetam (KEPPRA) 1000 MG tablet Take 1 tablet (1,000 mg total) by mouth 2 (two) times daily. 60 tablet 0  . lidocaine-prilocaine (EMLA) cream Apply 1 teaspoon to skin 1 to 2 hours before use cover to keep in contact with skin 30 g PRN  . loratadine (CLARITIN) 10 MG tablet Take 1 tablet (10 mg total) by mouth daily.    . mirtazapine (REMERON) 7.5 MG tablet Take by mouth.    . Multiple Vitamins-Minerals (MULTIVITAMIN WITH MINERALS) tablet Take by mouth.    . naproxen (NAPROSYN) 500 MG tablet Take by mouth.    . ondansetron (ZOFRAN) 4 MG tablet Take by mouth.    . oxyCODONE-acetaminophen (PERCOCET) 10-325 MG tablet Take by mouth.    . pantoprazole (PROTONIX) 40 MG tablet Take by mouth.    . potassium chloride SA (KLOR-CON M20) 20 MEQ tablet Take by mouth.    . senna-docusate (SENOKOT-S) 8.6-50 MG per tablet Take 2 tablets by mouth at bedtime. 30 tablet 0   No current facility-administered medications for this visit.     SURGICAL HISTORY:  Past Surgical History:  Procedure Laterality Date  . laproscopy surgery to check her tubes    . LUMBAR LAMINECTOMY/DECOMPRESSION MICRODISCECTOMY Left 05/01/2014   Procedure: Microdiscectomy - L5-S1 - left;  Surgeon: Eustace Moore, MD;  Location: [facility];  Service: Neurosurgery;  Laterality: Left;    REVIEW OF SYSTEMS:  Constitutional: positive for anorexia and fatigue Eyes: negative Ears, nose, mouth, throat, and face: negative Respiratory: positive for pleurisy/chest pain Cardiovascular: negative Gastrointestinal: positive for diarrhea and nausea Genitourinary:negative Integument/breast: negative Hematologic/lymphatic: negative Musculoskeletal:positive for bone  pain and muscle weakness Neurological: negative Behavioral/Psych: negative Endocrine: negative Allergic/Immunologic: negative   PHYSICAL EXAMINATION: General appearance: alert, cooperative, fatigued and no distress Head: Normocephalic, without obvious abnormality, atraumatic Neck: no adenopathy, no JVD, supple, symmetrical, trachea midline and thyroid not enlarged, symmetric, no tenderness/mass/nodules Lymph nodes: Cervical, supraclavicular, and axillary nodes normal. Resp: clear to auscultation bilaterally Back: symmetric, no curvature. ROM normal. No CVA tenderness. Cardio: regular rate and rhythm, S1, S2 normal, no murmur, click, rub or gallop GI: soft, non-tender; bowel sounds normal; no masses,  no organomegaly Extremities: extremities normal, atraumatic, no cyanosis or edema Neurologic: Alert and oriented X 3, normal strength and tone. Normal symmetric reflexes. Normal coordination and gait  ECOG PERFORMANCE STATUS: 1 - Symptomatic but completely ambulatory  Blood pressure 92/78, pulse (!) 106, temperature 98 F (36.7 C), temperature source Axillary, resp. rate 18, SpO2 100 %.  LABORATORY DATA: Lab Results  Component Value Date   WBC 4.3 05/03/2016   HGB 14.2 05/03/2016   HCT 42.2 05/03/2016   MCV 93.2 05/03/2016   PLT 354 05/03/2016      Chemistry      Component Value Date/Time   NA 137  05/03/2016 0821   K 3.4 (L) 05/03/2016 0821   CL 111 03/08/2015 1100   CO2 18 (L) 05/03/2016 0821   BUN 7.4 05/03/2016 0821   CREATININE 0.8 05/03/2016 0821      Component Value Date/Time   CALCIUM 8.4 05/03/2016 0821   ALKPHOS 72 05/03/2016 0821   AST 15 05/03/2016 0821   ALT 18 05/03/2016 0821   BILITOT 1.04 05/03/2016 0821       RADIOGRAPHIC STUDIES: Ct Chest W Contrast  Result Date: 04/12/2016 CLINICAL DATA:  46 year old female with history of hematemesis intermittently since November 2017. Undergoing ongoing chemotherapy and radiation therapy for non-small cell  carcinoma of the right lung with known metastatic disease to the pelvis. EXAM: CT CHEST, ABDOMEN, AND PELVIS WITH CONTRAST TECHNIQUE: Multidetector CT imaging of the chest, abdomen and pelvis was performed following the standard protocol during bolus administration of intravenous contrast. CONTRAST:  123m ISOVUE-300 IOPAMIDOL (ISOVUE-300) INJECTION 61%, 357mISOVUE-300 IOPAMIDOL (ISOVUE-300) INJECTION 61% COMPARISON:  CT the chest, abdomen and pelvis 03/02/2016. FINDINGS: CT CHEST FINDINGS Cardiovascular: Heart size is normal. There is no significant pericardial fluid, thickening or pericardial calcification. Right internal jugular single-lumen porta cath with tip terminating in the right atrium. No atherosclerotic calcifications in the thoracic aorta or the coronary arteries. Mediastinum/Nodes: No pathologically enlarged mediastinal or hilar lymph nodes. Esophagus is unremarkable in appearance. No axillary lymphadenopathy. Lungs/Pleura: Centrally located infrahilar nodule in knee right lower lobe is similar to the prior study, measuring 19 x 13 mm (image 67 of series 4) on today's examination. No other suspicious appearing pulmonary nodules or masses are noted. Small calcified granulomas are also noted in the right middle and upper lobes. No acute consolidative airspace disease. No pleural effusions. Mild diffuse bronchial wall thickening with mild centrilobular emphysema. Musculoskeletal: There are no aggressive appearing lytic or blastic lesions noted in the visualized portions of the skeleton. CT ABDOMEN PELVIS FINDINGS Hepatobiliary: There are no cystic or solid hepatic lesions. No intra or extrahepatic biliary ductal dilatation. Gallbladder is normal in appearance. Pancreas: No pancreatic mass. No pancreatic ductal dilatation. No pancreatic or peripancreatic fluid or inflammatory changes. Spleen: Unremarkable. Adrenals/Urinary Tract: Bilateral adrenal metastasis appears slightly smaller than the prior  examination, measuring up to 2.3 x 1.6 cm on the right and 3.4 x 1.7 cm on the left on today's examination. 1.6 cm simple cyst in the posterior aspect of the interpolar region of the right kidney. Subcentimeter low-attenuation lesions in the kidneys bilaterally are too small to definitively characterize, but are statistically likely to represent tiny cysts. No hydroureteronephrosis. Urinary bladder is normal in appearance. Stomach/Bowel: The appearance of the stomach is normal. There is no pathologic dilatation of small bowel or colon. Normal appendix. Vascular/Lymphatic: Aortic atherosclerosis, without evidence of aneurysm or dissection in the abdominal or pelvic vasculature. No lymphadenopathy noted in the abdomen or pelvis. Reproductive: Uterus and ovaries are unremarkable in appearance. Other: No significant volume of ascites.  No pneumoperitoneum. Musculoskeletal: Mixed lytic and sclerotic lesion in the posterior aspect of the left ilium just adjacent to the sacroiliac joint is similar to prior examinations measuring approximately 3.1 x 2.1 cm (axial image 94 of series 2). Relatively ill-defined lytic lesion in the lateral aspect of the sacrum adjacent to the left sacroiliac joint corresponding to recently recognized metastatic lesion on prior MRI pelvis 03/15/2016. No other definite suspicious appearing lytic rule blastic osseous lesions are noted in the visualized portions of the skeleton. IMPRESSION: 1. Stable appearance of right lower lobe nodule. No mediastinal  or hilar lymphadenopathy at this time. 2. Decreased size of bilateral adrenal metastases, indicative of a positive response to therapy. 3. Similar appearance of osseous metastasis in the bony pelvis, as discussed above. 4. No new metastatic lesions. 5. Aortic atherosclerosis. Electronically Signed   By: Vinnie Langton M.D.   On: 04/12/2016 14:43   Ct Abdomen Pelvis W Contrast  Result Date: 04/12/2016 CLINICAL DATA:  46 year old female with  history of hematemesis intermittently since November 2017. Undergoing ongoing chemotherapy and radiation therapy for non-small cell carcinoma of the right lung with known metastatic disease to the pelvis. EXAM: CT CHEST, ABDOMEN, AND PELVIS WITH CONTRAST TECHNIQUE: Multidetector CT imaging of the chest, abdomen and pelvis was performed following the standard protocol during bolus administration of intravenous contrast. CONTRAST:  125m ISOVUE-300 IOPAMIDOL (ISOVUE-300) INJECTION 61%, 362mISOVUE-300 IOPAMIDOL (ISOVUE-300) INJECTION 61% COMPARISON:  CT the chest, abdomen and pelvis 03/02/2016. FINDINGS: CT CHEST FINDINGS Cardiovascular: Heart size is normal. There is no significant pericardial fluid, thickening or pericardial calcification. Right internal jugular single-lumen porta cath with tip terminating in the right atrium. No atherosclerotic calcifications in the thoracic aorta or the coronary arteries. Mediastinum/Nodes: No pathologically enlarged mediastinal or hilar lymph nodes. Esophagus is unremarkable in appearance. No axillary lymphadenopathy. Lungs/Pleura: Centrally located infrahilar nodule in knee right lower lobe is similar to the prior study, measuring 19 x 13 mm (image 67 of series 4) on today's examination. No other suspicious appearing pulmonary nodules or masses are noted. Small calcified granulomas are also noted in the right middle and upper lobes. No acute consolidative airspace disease. No pleural effusions. Mild diffuse bronchial wall thickening with mild centrilobular emphysema. Musculoskeletal: There are no aggressive appearing lytic or blastic lesions noted in the visualized portions of the skeleton. CT ABDOMEN PELVIS FINDINGS Hepatobiliary: There are no cystic or solid hepatic lesions. No intra or extrahepatic biliary ductal dilatation. Gallbladder is normal in appearance. Pancreas: No pancreatic mass. No pancreatic ductal dilatation. No pancreatic or peripancreatic fluid or inflammatory  changes. Spleen: Unremarkable. Adrenals/Urinary Tract: Bilateral adrenal metastasis appears slightly smaller than the prior examination, measuring up to 2.3 x 1.6 cm on the right and 3.4 x 1.7 cm on the left on today's examination. 1.6 cm simple cyst in the posterior aspect of the interpolar region of the right kidney. Subcentimeter low-attenuation lesions in the kidneys bilaterally are too small to definitively characterize, but are statistically likely to represent tiny cysts. No hydroureteronephrosis. Urinary bladder is normal in appearance. Stomach/Bowel: The appearance of the stomach is normal. There is no pathologic dilatation of small bowel or colon. Normal appendix. Vascular/Lymphatic: Aortic atherosclerosis, without evidence of aneurysm or dissection in the abdominal or pelvic vasculature. No lymphadenopathy noted in the abdomen or pelvis. Reproductive: Uterus and ovaries are unremarkable in appearance. Other: No significant volume of ascites.  No pneumoperitoneum. Musculoskeletal: Mixed lytic and sclerotic lesion in the posterior aspect of the left ilium just adjacent to the sacroiliac joint is similar to prior examinations measuring approximately 3.1 x 2.1 cm (axial image 94 of series 2). Relatively ill-defined lytic lesion in the lateral aspect of the sacrum adjacent to the left sacroiliac joint corresponding to recently recognized metastatic lesion on prior MRI pelvis 03/15/2016. No other definite suspicious appearing lytic rule blastic osseous lesions are noted in the visualized portions of the skeleton. IMPRESSION: 1. Stable appearance of right lower lobe nodule. No mediastinal or hilar lymphadenopathy at this time. 2. Decreased size of bilateral adrenal metastases, indicative of a positive response to therapy. 3. Similar  appearance of osseous metastasis in the bony pelvis, as discussed above. 4. No new metastatic lesions. 5. Aortic atherosclerosis. Electronically Signed   By: Vinnie Langton M.D.    On: 04/12/2016 14:43    ASSESSMENT AND PLAN: This is a very pleasant 46 years old African-American female with very complicated condition including stage IV non-small cell lung cancer with disease progression in the bone as well as multiple metastatic brain lesions status post whole brain irradiation as well as palliative radiotherapy to the left hip area. She continues to have significant pain in the left hip as well as nausea vomiting and diarrhea. She is currently on systemic chemotherapy with docetaxel and Cyramza status post 2 cycles. She tolerated the treatment well except for the fatigue. She denied having any bleeding issues. She has been off the treatment for the last 3 weeks. Her last CT scan of the chest, abdomen and pelvis showed stable appearance of the lung nodule was decreased in the bilateral adrenal metastasis indicating response to the treatment. I had a lengthy discussion with the patient today about her condition and treatment options. I discussed with her the goals of care. I gave her the option of palliative care and hospice referral versus proceeding with the same systemic therapy with docetaxel and Cyramza. The patient is interested in continuing with treatment for now. She will start cycle #3 today but I will reduce the dose of docetaxel to 65 MG/M2. For the nausea, I will start the patient on the Abraxane at 10 mg by mouth daily at bedtime. For the diarrhea, she would continue on Imodium for now. For the earache, I will start the patient on amoxicillin for 5 days. For pain management, the patient will continue on fentanyl patch and Percocet. She was also started recently on naproxen by Dr. Durenda Hurt. For depression, the patient will continue on Remeron for now. She would come back for follow-up visit in 3 weeks for evaluation before starting cycle #4 of her chemotherapy. The patient was advised to call immediately if she has any concerning symptoms in the interval. The  patient voices understanding of current disease status and treatment options and is in agreement with the current care plan.  All questions were answered. The patient knows to call the clinic with any problems, questions or concerns. We can certainly see the patient much sooner if necessary.  Disclaimer: This note was dictated with voice recognition software. Similar sounding words can inadvertently be transcribed and may not be corrected upon review.

## 2016-05-03 NOTE — Telephone Encounter (Signed)
NO 1/10 LOS. PATIENT CURRENTY HAS APPOINTMENTS ON SCHEDULE THRU END OF January.

## 2016-05-03 NOTE — Progress Notes (Signed)
Okay to proceed with treatment today per Dr. Julien Nordmann, with BP- 92/78 and heart rate of 106.

## 2016-05-03 NOTE — Patient Instructions (Signed)
[facility] Discharge Instructions for Patients Receiving Chemotherapy  Today you received the following chemotherapy agents Cyramza and Taxotere To help prevent nausea and vomiting after your treatment, we encourage you to take your nausea medication as directed   If you develop nausea and vomiting that is not controlled by your nausea medication, call the clinic.   BELOW ARE SYMPTOMS THAT SHOULD BE REPORTED IMMEDIATELY:  *FEVER GREATER THAN 100.5 F  *CHILLS WITH OR WITHOUT FEVER  NAUSEA AND VOMITING THAT IS NOT CONTROLLED WITH YOUR NAUSEA MEDICATION  *UNUSUAL SHORTNESS OF BREATH  *UNUSUAL BRUISING OR BLEEDING  TENDERNESS IN MOUTH AND THROAT WITH OR WITHOUT PRESENCE OF ULCERS  *URINARY PROBLEMS  *BOWEL PROBLEMS  UNUSUAL RASH Items with * indicate a potential emergency and should be followed up as soon as possible.  Feel free to call the clinic you have any questions or concerns. The clinic phone number is [phone].  Please show the CHEMO ALERT CARD at check-in to the Emergency Department and triage nurse.   

## 2016-05-03 NOTE — Progress Notes (Signed)
Took off duragesic patches because they were in radiation site-explained that patches can go behind shoulder next time. Has not been able to keep  meds down-Andrea [name] will order Zyprexa.

## 2016-05-04 ENCOUNTER — Ambulatory Visit: Payer: Medicaid Other

## 2016-05-04 MED FILL — AMOXICILLIN 500 MG CAPSULE: 500 | 5 days supply | Qty: 10 | Fill #0

## 2016-05-04 MED FILL — fentaNYL 12 MCG/HR PT72: 12 | 30 days supply | Qty: 10 | Fill #0

## 2016-05-04 MED FILL — OXYCODONE-APAP 10-325: 10-325 | 7 days supply | Qty: 30 | Fill #0

## 2016-05-04 MED FILL — fentaNYL 50 MCG/HR PT72: 50 | 30 days supply | Qty: 10 | Fill #0

## 2016-05-05 ENCOUNTER — Encounter: Payer: Self-pay | Admitting: Pediatrics

## 2016-05-05 MED FILL — OLANZapine 10 MG TABS: 10 | 30 days supply | Qty: 30 | Fill #0

## 2016-05-05 NOTE — Progress Notes (Signed)
  Radiation Oncology         [phone] ________________________________  Name: Andrea [name] MRN: [id number]  Date: 05/03/2016  DOB: February 02, 1971  End of Treatment Note  Diagnosis:   46 yo woman with recurrent left iliac metastasis in a previously treated site     Indication for treatment:  Palliative       Radiation treatment dates:   04/19/2016 to 05/04/2015  Site/dose:   The Left hip was re-treated to 30 Gy in 10 fractions at 3 Gy per fraction.   Beams/energy:   3D // 10X, 15X  Narrative: The patient tolerated radiation treatment relatively well.   Her pain improved with radiation treatment. She denies any side effects from radiation.   Plan: The patient has completed radiation treatment. The patient will return to radiation oncology clinic for routine followup in one month. I advised her to call or return sooner if she has any questions or concerns related to her recovery or treatment. ________________________________  Sheral Apley. Tammi Klippel, M.D.  This document serves as a record of services personally performed by Tyler Pita, MD. It was created on his behalf by Arlyce Harman, a trained medical scribe. The creation of this record is based on the scribe's personal observations and the provider's statements to them. This document has been checked and approved by the attending provider.

## 2016-05-05 NOTE — Progress Notes (Signed)
Received PA request for Olanzapine.  Called [phone] to initiate PA.  Olanzapine 10 MG tablet qty 30  Approved through 04/30/17 IH#[id number]  Ref HX#[id number] for call.  Called [facility]) to advise. Approval accepted.

## 2016-05-10 ENCOUNTER — Telehealth: Payer: Self-pay | Admitting: *Deleted

## 2016-05-10 ENCOUNTER — Other Ambulatory Visit: Payer: Self-pay

## 2016-05-10 ENCOUNTER — Ambulatory Visit: Payer: Self-pay

## 2016-05-10 NOTE — Telephone Encounter (Signed)
Attempted to call patient regarding appts today; no answer. Left non-descriptive message on VM regarding appts.  NOTE: INCLEMENT WEATHER--Patient comes by PTAR.

## 2016-05-11 ENCOUNTER — Telehealth: Payer: Self-pay | Admitting: Medical Oncology

## 2016-05-11 NOTE — Telephone Encounter (Signed)
Cancel appt due to inclement weather. Schedule request sent.

## 2016-05-12 ENCOUNTER — Ambulatory Visit: Payer: Self-pay

## 2016-05-17 ENCOUNTER — Other Ambulatory Visit (HOSPITAL_BASED_OUTPATIENT_CLINIC_OR_DEPARTMENT_OTHER): Payer: Medicaid Other

## 2016-05-17 ENCOUNTER — Ambulatory Visit: Payer: Medicaid Other

## 2016-05-17 ENCOUNTER — Other Ambulatory Visit: Payer: Self-pay | Admitting: *Deleted

## 2016-05-17 ENCOUNTER — Ambulatory Visit (HOSPITAL_BASED_OUTPATIENT_CLINIC_OR_DEPARTMENT_OTHER): Payer: Medicaid Other

## 2016-05-17 VITALS — BP 97/70 | HR 80 | Temp 97.7°F | Resp 18

## 2016-05-17 DIAGNOSIS — C3491 Malignant neoplasm of unspecified part of right bronchus or lung: Secondary | ICD-10-CM

## 2016-05-17 DIAGNOSIS — C7951 Secondary malignant neoplasm of bone: Secondary | ICD-10-CM

## 2016-05-17 DIAGNOSIS — E876 Hypokalemia: Secondary | ICD-10-CM

## 2016-05-17 DIAGNOSIS — C3490 Malignant neoplasm of unspecified part of unspecified bronchus or lung: Secondary | ICD-10-CM

## 2016-05-17 LAB — CBC WITH DIFFERENTIAL/PLATELET
BASO%: 0.2 % (ref 0.0–2.0)
BASOS ABS: 0 10*3/uL (ref 0.0–0.1)
EOS%: 0.2 % (ref 0.0–7.0)
Eosinophils Absolute: 0 10*3/uL (ref 0.0–0.5)
HCT: 31.8 % — ABNORMAL LOW (ref 34.8–46.6)
HEMOGLOBIN: 10.6 g/dL — AB (ref 11.6–15.9)
LYMPH%: 19.5 % (ref 14.0–49.7)
MCH: 31.5 pg (ref 25.1–34.0)
MCHC: 33.3 g/dL (ref 31.5–36.0)
MCV: 94.4 fL (ref 79.5–101.0)
MONO#: 0.5 10*3/uL (ref 0.1–0.9)
MONO%: 11.1 % (ref 0.0–14.0)
NEUT#: 3.1 10*3/uL (ref 1.5–6.5)
NEUT%: 69 % (ref 38.4–76.8)
Platelets: 155 10*3/uL (ref 145–400)
RBC: 3.37 10*6/uL — AB (ref 3.70–5.45)
RDW: 17.6 % — AB (ref 11.2–14.5)
WBC: 4.4 10*3/uL (ref 3.9–10.3)
lymph#: 0.9 10*3/uL (ref 0.9–3.3)

## 2016-05-17 LAB — COMPREHENSIVE METABOLIC PANEL
ALT: <6 U/L (ref 0–55)
AST: 10 U/L (ref 5–34)
Albumin: 2.8 g/dL — ABNORMAL LOW (ref 3.5–5.0)
Alkaline Phosphatase: 73 U/L (ref 40–150)
Anion Gap: 10 mEq/L (ref 3–11)
BUN: 6.7 mg/dL — AB (ref 7.0–26.0)
CHLORIDE: 107 meq/L (ref 98–109)
CO2: 25 mEq/L (ref 22–29)
Calcium: 8.6 mg/dL (ref 8.4–10.4)
Creatinine: 0.7 mg/dL (ref 0.6–1.1)
GLUCOSE: 102 mg/dL (ref 70–140)
POTASSIUM: 2.7 meq/L — AB (ref 3.5–5.1)
SODIUM: 141 meq/L (ref 136–145)
Total Bilirubin: 0.25 mg/dL (ref 0.20–1.20)
Total Protein: 5.8 g/dL — ABNORMAL LOW (ref 6.4–8.3)

## 2016-05-17 MED ORDER — POTASSIUM CHLORIDE CRYS ER 20 MEQ PO TBCR: 40.0000 meq | EXTENDED_RELEASE_TABLET | Freq: Every day | ORAL | 0 refills | Status: DC

## 2016-05-17 MED ORDER — SODIUM CHLORIDE 0.9 % IV SOLN
40.0000 meq | Freq: Once | INTRAVENOUS | Status: AC
  Administered 2016-05-17: 40 meq via INTRAVENOUS
  Filled 2016-05-17: qty 20

## 2016-05-17 MED ORDER — OXYCODONE-ACETAMINOPHEN 10-325 MG PO TABS: 1.0000 | ORAL_TABLET | Freq: Four times a day (QID) | ORAL | 0 refills | Status: DC | PRN

## 2016-05-17 MED ORDER — SODIUM CHLORIDE 0.9 % IV SOLN: 1000.0000 | INTRAVENOUS | Status: DC

## 2016-05-17 MED FILL — KLOR-CON M20 TABLET: 20 | 10 days supply | Qty: 20 | Fill #0

## 2016-05-17 NOTE — Progress Notes (Signed)
Nutrition Assessment   Reason for Assessment:  Patient positive for poor appetite and weight loss on Malnutrition Screening Report  ASSESSMENT:  46 year old female with stage IV lung cancer, large soft tissue mass in posterior iliac bone, metastatic bone lesions and brain lesions. Past medical history of adrenal mass, depression, headache  Patient reports appetite has been better over the last few weeks.  Reports in December for several weeks was having nausea, vomiting and diarrhea but that is better now. During December unable to keep food down and eating very little.  Eating popcorn during visit this am in infusion.  Reports mostly snacks during the day (chips, peanut butter nabs).  Mainly eats frozen meals with meat and vegetable.  Patient lives at home with caregiver.  Nutrition Focused Physical Exam: Nutrition-Focused physical exam completed. Findings are moderate in arm area fat depletion, no muscle depletion, and no edema.   Medications: reviewed  Labs: reveiwed  Anthropometrics:   Height: 66 Weight: 136 lb Noted 151 lb in 11/29 (10% weight loss in 2 months, significant) BMI: 21   NUTRITION DIAGNOSIS: Unintentional weight loss related to cancer treatments as evidenced by 10% weight loss and eating < or equal to 50% of energy intake for > or equal to 5 days.  This has improved as eating better per patient   MALNUTRITION DIAGNOSIS:    INTERVENTION:   Discussed strategies to increase calories and protein to prevent further weight loss.  Examples provided.   Patient asking about ensure program and discussed that she is able to receive up to 3 complimentary cases of ensure during time at cancer center.  Patient received first complimentary case of ensure plus today.    MONITORING, EVALUATION, GOAL: Patient will continue to consume adequate calories and protein to prevent further weight loss.   NEXT VISIT: as needed  Galen Malkowski B. Zenia Resides, [place], [place]  (pager)

## 2016-05-17 NOTE — Telephone Encounter (Signed)
Joni, RN infusion called pt requesting Pain meds. Oxycodone refill reviewed with MD, walked Rx to pt in treatment room.

## 2016-05-17 NOTE — Progress Notes (Signed)
Injection was completed in Infusion Room

## 2016-05-17 NOTE — Patient Instructions (Signed)
Hypokalemia Hypokalemia means that the amount of potassium in the blood is lower than normal.Potassium is a chemical that helps regulate the amount of fluid in the body (electrolyte). It also stimulates muscle tightening (contraction) and helps nerves work properly.Normally, most of the body's potassium is inside of cells, and only a very small amount is in the blood. Because the amount in the blood is so small, minor changes to potassium levels in the blood can be life-threatening. What are the causes? This condition may be caused by:  Antibiotic medicine.  Diarrhea or vomiting. Taking too much of a medicine that helps you have a bowel movement (laxative) can cause diarrhea and lead to hypokalemia.  Chronic kidney disease (CKD).  Medicines that help the body get rid of excess fluid (diuretics).  Eating disorders, such as bulimia.  Low magnesium levels in the body.  Sweating a lot. What are the signs or symptoms? Symptoms of this condition include:  Weakness.  Constipation.  Fatigue.  Muscle cramps.  Mental confusion.  Skipped heartbeats or irregular heartbeat (palpitations).  Tingling or numbness. How is this diagnosed? This condition is diagnosed with a blood test. How is this treated? Hypokalemia can be treated by taking potassium supplements by mouth or adjusting the medicines that you take. Treatment may also include eating more foods that contain a lot of potassium. If your potassium level is very low, you may need to get potassium through an IV tube in one of your veins and be monitored in the hospital. Follow these instructions at home:  Take over-the-counter and prescription medicines only as told by your health care provider. This includes vitamins and supplements.  Eat a healthy diet. A healthy diet includes fresh fruits and vegetables, whole grains, healthy fats, and lean proteins.  If instructed, eat more foods that contain a lot of potassium, such  as:  Nuts, such as peanuts and pistachios.  Seeds, such as sunflower seeds and pumpkin seeds.  Peas, lentils, and lima beans.  Whole grain and bran cereals and breads.  Fresh fruits and vegetables, such as apricots, avocado, bananas, cantaloupe, kiwi, oranges, tomatoes, asparagus, and potatoes.  Orange juice.  Tomato juice.  Red meats.  Yogurt.  Keep all follow-up visits as told by your health care provider. This is important. Contact a health care provider if:  You have weakness that gets worse.  You feel your heart pounding or racing.  You vomit.  You have diarrhea.  You have diabetes (diabetes mellitus) and you have trouble keeping your blood sugar (glucose) in your target range. Get help right away if:  You have chest pain.  You have shortness of breath.  You have vomiting or diarrhea that lasts for more than 2 days.  You faint. This information is not intended to replace advice given to you by your health care provider. Make sure you discuss any questions you have with your health care provider. Document Released: 04/10/2005 Document Revised: 11/27/2015 Document Reviewed: 11/27/2015 Elsevier Interactive Patient Education  2017 [employer]  

## 2016-05-18 ENCOUNTER — Ambulatory Visit (INDEPENDENT_AMBULATORY_CARE_PROVIDER_SITE_OTHER): Payer: Medicaid Other | Admitting: *Deleted

## 2016-05-18 DIAGNOSIS — Z3040 Encounter for surveillance of contraceptives, unspecified: Secondary | ICD-10-CM | POA: Diagnosis not present

## 2016-05-18 DIAGNOSIS — Z3042 Encounter for surveillance of injectable contraceptive: Secondary | ICD-10-CM

## 2016-05-18 MED ORDER — MEDROXYPROGESTERONE ACETATE 150 MG/ML IM SUSP
150.0000 mg | Freq: Once | INTRAMUSCULAR | Status: AC
  Administered 2016-05-18: 150 mg via INTRAMUSCULAR

## 2016-05-18 MED FILL — LIDOCAINE-PRILOCAINE CREAM: 2.5-2.5 | 15 days supply | Qty: 30 | Fill #1

## 2016-05-18 MED FILL — OXYCODONE-APAP 10-325: 10-325 | 8 days supply | Qty: 30 | Fill #0

## 2016-05-18 NOTE — Progress Notes (Signed)
Counselor contacted patient by phone to offer emotional and social support. Patient sounded bright and stated that she was "making it". Patient described recent interactions with her mother, who stayed with patient for a week to provide support during the recent snowfall. Patient indicated that she appreciated the help but felt some stress due to the strain in their relationship. Patient also noted that she had recently had an argument with her father during which she had to defend herself against negative perceptions. When asked about other support people, patient stated that she doesn't really have any friends, as she had begun to cut ties with "fake" people even before she was diagnosed.   Counselor will continue to check in with patient by phone, as counselor's schedule conflicts with patient's visits to the hospital.   Lamount Cohen, Counseling Intern Supervisor - [facility]

## 2016-05-19 ENCOUNTER — Ambulatory Visit: Payer: Self-pay

## 2016-05-22 ENCOUNTER — Telehealth: Payer: Self-pay | Admitting: *Deleted

## 2016-05-22 ENCOUNTER — Ambulatory Visit (INDEPENDENT_AMBULATORY_CARE_PROVIDER_SITE_OTHER): Payer: Medicaid Other | Admitting: Internal Medicine

## 2016-05-22 ENCOUNTER — Telehealth: Payer: Self-pay

## 2016-05-22 VITALS — BP 103/69 | HR 102 | Temp 98.3°F | Wt 146.5 lb

## 2016-05-22 DIAGNOSIS — M25562 Pain in left knee: Secondary | ICD-10-CM

## 2016-05-22 DIAGNOSIS — Z87891 Personal history of nicotine dependence: Secondary | ICD-10-CM | POA: Diagnosis not present

## 2016-05-22 DIAGNOSIS — C3490 Malignant neoplasm of unspecified part of unspecified bronchus or lung: Secondary | ICD-10-CM | POA: Diagnosis not present

## 2016-05-22 DIAGNOSIS — R220 Localized swelling, mass and lump, head: Secondary | ICD-10-CM

## 2016-05-22 DIAGNOSIS — Z9181 History of falling: Secondary | ICD-10-CM | POA: Diagnosis not present

## 2016-05-22 DIAGNOSIS — M25561 Pain in right knee: Secondary | ICD-10-CM

## 2016-05-22 DIAGNOSIS — G893 Neoplasm related pain (acute) (chronic): Secondary | ICD-10-CM | POA: Diagnosis not present

## 2016-05-22 DIAGNOSIS — C7951 Secondary malignant neoplasm of bone: Secondary | ICD-10-CM | POA: Diagnosis not present

## 2016-05-22 DIAGNOSIS — C7931 Secondary malignant neoplasm of brain: Secondary | ICD-10-CM

## 2016-05-22 DIAGNOSIS — M5489 Other dorsalgia: Secondary | ICD-10-CM

## 2016-05-22 DIAGNOSIS — E876 Hypokalemia: Secondary | ICD-10-CM

## 2016-05-22 DIAGNOSIS — H578 Other specified disorders of eye and adnexa: Secondary | ICD-10-CM

## 2016-05-22 DIAGNOSIS — Z923 Personal history of irradiation: Secondary | ICD-10-CM | POA: Diagnosis not present

## 2016-05-22 MED ORDER — DICLOFENAC SODIUM 1 % TD GEL: 4.0000 g | Freq: Four times a day (QID) | TRANSDERMAL | 2 refills | Status: DC

## 2016-05-22 MED FILL — VOLTAREN 1% GEL: 1 | 7 days supply | Qty: 100 | Fill #0

## 2016-05-22 NOTE — Patient Instructions (Addendum)
Unclear what caused the facial swelling. I am glad it's getting better. Continue warm compresses. Follow up with Dr. Inda Merlin.  We will recheck your potassium today.   Talk to Dr. Inda Merlin about physical therapy.

## 2016-05-22 NOTE — Progress Notes (Signed)
CC: right sided face and eye swelling  HPI:  [name] is a 46 y.o. acute complaint of right sided face and eye swelling  Past Medical History:  Diagnosis Date  . Adrenal mass, left ([place]) 09/21/2015  . Anginal pain ([place])    chest pain not sure what related to  . Anxiety   . Bone cancer ([place])    lung ca with bone mets  . Chronic back pain   . Depression   . Dysmenorrhea 12/06/2009   Qualifier: Diagnosis of  By: [name] RN, Regino Schultze    . Encounter for antineoplastic immunotherapy 10/07/2015  . Family history of adverse reaction to anesthesia    mother stopped breathing after surgery  . Goals of care, counseling/discussion 05/03/2016  . Headache    stress  . Low back pain radiating to left leg 11/29/2010  . Non-small cell carcinoma of right lung, stage 4 ([place]) 01/01/2015  . S/P radiation therapy 01/01/2015 through 01/21/2015     Left iliac bone 3500 cGy in 14 sessions   . 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 S/p palliative radiotherapy to left ilian bone. Received chemotherapy in the past and remains on chemo docetaxel and Cyramza  under Dr. Lew Dawes care.  multiple metastatic brain lesions status post whole brain irradiation.  Started having right face and eye swelling on Friday morning. Received depo-provera shot on Thursday. No other new meds. No fever, no pain, had some clear eye discahrge. No vision changes. No sinus problems.   On fentanyl and oxycodone for chronic cancer related pain. Pain increased due to recent fall in the snow. Has bilateral knee pain as well. Interested in Physical tehrapy. States she will discuss this  with Dr. Inda Merlin but I also offered her a PT referral if she wants after talking to Dr. Inda Merlin.    Review of Systems:    Review of Systems  Constitutional: Negative for chills and fever.  HENT: Negative for sinus pain and sore throat.   Gastrointestinal: Negative for heartburn, nausea and vomiting.  Musculoskeletal: Positive for back pain, joint pain and myalgias.  Skin: Negative for rash.     Physical Exam:  Vitals:   05/22/16 1412  BP: 103/69  Pulse: (!) 102  Temp: 98.3 F (36.8 C)  TempSrc: Oral  SpO2: 100%  Weight: 146 lb 8 oz (66.5 kg)   Physical Exam  Constitutional: She is oriented to person, place, and time.  NAD. Lying on her wide due to pain on back.  HENT:  Right cheek had some mild swelling. Significantly better than the picture that patient took on Friday.  No discharge. No erythema. No warmth. No sinus tenderness. Oral mucosa has no lesions or exudates or abscess.   Eyes: Conjunctivae are normal.  Right upper lateral eyelid has some swelling. No discharge. No scleral icteruas, no conjunctival injection. Pupils are equal on both sides. EOMI.   Cardiovascular: Exam reveals no gallop and no friction rub.   No murmur heard. Respiratory: Effort normal and breath sounds normal.  GI: Soft. Bowel sounds are normal.  Musculoskeletal:  Has chronic left sided back pain.   Neurological: She is alert and oriented to person, place, and time.  Assessment & Plan:   See Encounters Tab for problem based charting.  Patient discussed with Dr. Dareen Piano

## 2016-05-22 NOTE — Telephone Encounter (Signed)
error 

## 2016-05-22 NOTE — Assessment & Plan Note (Signed)
Has cancer associated pain on her back due to bone mets to the ischium. Also has pain on both knees likely from deconditioning due to her cancer related pain and overall fatigue/weakness from her cancer treatment.  She will benefit from physical therapy. She states she will first discuss this with Dr. Earlie Server.  I also prescribed voltaren gel for knee pain. Cont fentanyl and oxycodone which she is getting from the cancer center.

## 2016-05-22 NOTE — Telephone Encounter (Signed)
Needs PA on voltaren gel

## 2016-05-22 NOTE — Assessment & Plan Note (Addendum)
Unclear etiology of acute right sided face and eye swelling. Has no signs of infection, not consistent with angioedema, no recent medication changes, no bug bites, no other signs of allergic reaction.   Docetaxel has some association to Fluid retention (includes edema and effusion; 13% to 60% per Uptodate but would be unusual to cause just localized facial swelling.  She is already much better with the warm compress usage. I asked her to continue this and to discuss this with Dr. Earlie Server next week to see if he has any other thoughts about the medication side effects.

## 2016-05-22 NOTE — Assessment & Plan Note (Signed)
Last K was 2.7, was given 4 runs of IV K+ at the cancer center. Also on PO 45mq K+ at home. Will recheck today.

## 2016-05-22 NOTE — Telephone Encounter (Signed)
Pt called to say she got a Depo shot on Thursday. She woke up on Friday and the right side of her face was swollen so much her eye was closed. She has an appt today at 2 with [facility] outpatient clinic with her PCP.  She is asking if she should come to [facility] instead of [facility]. This RN instructed her to keep appt at [facility] unless Dr Julien Nordmann calls her back.   The face was tight, she denies redness or heat. It is tender to touch, she used warm moist washcloths. She does not have a cold or nasal drainage nor ear drainage. The swelling is improved and she can see out of her R eye. She has thick saliva from the chemo .  She also reported she slipped on ice on Sunday and fell on her left side. She is sore from this and the fentanyl patches and percocet are not helping. She denies bruising.  She has been out of [place] since 1/12, she mentioned a brain scan that was supposed to be done last month and is wondering what further scans are needed.   She has lab/infusion/MD on 1/31. Cyramza, docetaxel, neulasta onpro.

## 2016-05-23 ENCOUNTER — Other Ambulatory Visit: Payer: Self-pay | Admitting: Internal Medicine

## 2016-05-23 LAB — BASIC METABOLIC PANEL
BUN/Creatinine Ratio: 5 — ABNORMAL LOW (ref 9–23)
BUN: 3 mg/dL — ABNORMAL LOW (ref 6–24)
CHLORIDE: 108 mmol/L — AB (ref 96–106)
CO2: 18 mmol/L (ref 18–29)
Calcium: 9.2 mg/dL (ref 8.7–10.2)
Creatinine, Ser: 0.56 mg/dL — ABNORMAL LOW (ref 0.57–1.00)
GFR calc Af Amer: 131 mL/min/{1.73_m2} (ref >59)
GFR, EST NON AFRICAN AMERICAN: 114 mL/min/{1.73_m2} (ref >59)
GLUCOSE: 90 mg/dL (ref 65–99)
POTASSIUM: 3.4 mmol/L — AB (ref 3.5–5.2)
SODIUM: 143 mmol/L (ref 134–144)

## 2016-05-24 ENCOUNTER — Telehealth: Payer: Self-pay | Admitting: *Deleted

## 2016-05-24 ENCOUNTER — Other Ambulatory Visit (HOSPITAL_BASED_OUTPATIENT_CLINIC_OR_DEPARTMENT_OTHER): Payer: Medicaid Other

## 2016-05-24 ENCOUNTER — Encounter: Payer: Self-pay | Admitting: Internal Medicine

## 2016-05-24 ENCOUNTER — Ambulatory Visit (HOSPITAL_BASED_OUTPATIENT_CLINIC_OR_DEPARTMENT_OTHER): Payer: Medicaid Other

## 2016-05-24 ENCOUNTER — Ambulatory Visit (HOSPITAL_BASED_OUTPATIENT_CLINIC_OR_DEPARTMENT_OTHER): Payer: Medicaid Other | Admitting: Internal Medicine

## 2016-05-24 VITALS — BP 126/85 | HR 63 | Temp 97.8°F | Resp 18 | Ht 66.0 in

## 2016-05-24 VITALS — BP 126/85 | HR 63 | Temp 97.8°F | Resp 18

## 2016-05-24 DIAGNOSIS — C3491 Malignant neoplasm of unspecified part of right bronchus or lung: Secondary | ICD-10-CM | POA: Diagnosis present

## 2016-05-24 DIAGNOSIS — Z79899 Other long term (current) drug therapy: Secondary | ICD-10-CM

## 2016-05-24 DIAGNOSIS — E876 Hypokalemia: Secondary | ICD-10-CM

## 2016-05-24 DIAGNOSIS — Z5189 Encounter for other specified aftercare: Secondary | ICD-10-CM

## 2016-05-24 DIAGNOSIS — Z5112 Encounter for antineoplastic immunotherapy: Secondary | ICD-10-CM

## 2016-05-24 DIAGNOSIS — C7931 Secondary malignant neoplasm of brain: Secondary | ICD-10-CM

## 2016-05-24 DIAGNOSIS — F329 Major depressive disorder, single episode, unspecified: Secondary | ICD-10-CM

## 2016-05-24 DIAGNOSIS — Z95828 Presence of other vascular implants and grafts: Secondary | ICD-10-CM

## 2016-05-24 DIAGNOSIS — Z5111 Encounter for antineoplastic chemotherapy: Secondary | ICD-10-CM

## 2016-05-24 DIAGNOSIS — R112 Nausea with vomiting, unspecified: Secondary | ICD-10-CM

## 2016-05-24 DIAGNOSIS — C7951 Secondary malignant neoplasm of bone: Secondary | ICD-10-CM

## 2016-05-24 DIAGNOSIS — G893 Neoplasm related pain (acute) (chronic): Secondary | ICD-10-CM | POA: Diagnosis not present

## 2016-05-24 DIAGNOSIS — M898X9 Other specified disorders of bone, unspecified site: Secondary | ICD-10-CM

## 2016-05-24 LAB — COMPREHENSIVE METABOLIC PANEL
ALBUMIN: 2.9 g/dL — AB (ref 3.5–5.0)
ALK PHOS: 64 U/L (ref 40–150)
ALT: 7 U/L (ref 0–55)
ANION GAP: 9 meq/L (ref 3–11)
AST: 10 U/L (ref 5–34)
BUN: 6.3 mg/dL — ABNORMAL LOW (ref 7.0–26.0)
CALCIUM: 9.6 mg/dL (ref 8.4–10.4)
CO2: 20 mEq/L — ABNORMAL LOW (ref 22–29)
CREATININE: 0.7 mg/dL (ref 0.6–1.1)
Chloride: 114 mEq/L — ABNORMAL HIGH (ref 98–109)
EGFR: >90 mL/min/{1.73_m2} (ref >90)
Glucose: 137 mg/dl (ref 70–140)
Potassium: 3.8 mEq/L (ref 3.5–5.1)
Sodium: 144 mEq/L (ref 136–145)
TOTAL PROTEIN: 6.4 g/dL (ref 6.4–8.3)

## 2016-05-24 LAB — CBC WITH DIFFERENTIAL/PLATELET
BASO%: 0.2 % (ref 0.0–2.0)
Basophils Absolute: 0 10*3/uL (ref 0.0–0.1)
EOS ABS: 0 10*3/uL (ref 0.0–0.5)
EOS%: 0 % (ref 0.0–7.0)
HEMATOCRIT: 31.5 % — AB (ref 34.8–46.6)
HGB: 10.6 g/dL — ABNORMAL LOW (ref 11.6–15.9)
LYMPH#: 0.6 10*3/uL — AB (ref 0.9–3.3)
LYMPH%: 10.9 % — AB (ref 14.0–49.7)
MCH: 32.6 pg (ref 25.1–34.0)
MCHC: 33.6 g/dL (ref 31.5–36.0)
MCV: 96.9 fL (ref 79.5–101.0)
MONO#: 0.2 10*3/uL (ref 0.1–0.9)
MONO%: 4.1 % (ref 0.0–14.0)
NEUT%: 84.8 % — ABNORMAL HIGH (ref 38.4–76.8)
NEUTROS ABS: 4.5 10*3/uL (ref 1.5–6.5)
PLATELETS: 336 10*3/uL (ref 145–400)
RBC: 3.25 10*6/uL — ABNORMAL LOW (ref 3.70–5.45)
RDW: 19.5 % — ABNORMAL HIGH (ref 11.2–14.5)
WBC: 5.3 10*3/uL (ref 3.9–10.3)

## 2016-05-24 LAB — UA PROTEIN, DIPSTICK - CHCC

## 2016-05-24 MED ORDER — ALTEPLASE 2 MG IJ SOLR
2.0000 mg | Freq: Once | INTRAMUSCULAR | Status: DC | PRN
  Filled 2016-05-24: qty 2

## 2016-05-24 MED ORDER — POTASSIUM CHLORIDE CRYS ER 20 MEQ PO TBCR: 40.0000 meq | EXTENDED_RELEASE_TABLET | Freq: Every day | ORAL | 0 refills | Status: DC

## 2016-05-24 MED ORDER — PEGFILGRASTIM 6 MG/0.6ML ~~LOC~~ PSKT
6.0000 mg | PREFILLED_SYRINGE | Freq: Once | SUBCUTANEOUS | Status: AC
  Administered 2016-05-24: 6 mg via SUBCUTANEOUS
  Filled 2016-05-24: qty 0.6

## 2016-05-24 MED ORDER — DIPHENHYDRAMINE HCL 50 MG/ML IJ SOLN: 50.0000 mg | Freq: Once | INTRAMUSCULAR | Status: DC | PRN

## 2016-05-24 MED ORDER — DIPHENHYDRAMINE HCL 50 MG/ML IJ SOLN: 25.0000 mg | Freq: Once | INTRAMUSCULAR | Status: DC | PRN

## 2016-05-24 MED ORDER — DEXAMETHASONE SODIUM PHOSPHATE 10 MG/ML IJ SOLN
INTRAMUSCULAR | Status: AC
  Filled 2016-05-24: qty 1

## 2016-05-24 MED ORDER — DOCETAXEL CHEMO INJECTION 160 MG/16ML
65.0000 mg/m2 | Freq: Once | INTRAVENOUS | Status: AC
  Administered 2016-05-24: 110 mg via INTRAVENOUS
  Filled 2016-05-24: qty 11

## 2016-05-24 MED ORDER — SENNOSIDES-DOCUSATE SODIUM 8.6-50 MG PO TABS: 2.0000 | ORAL_TABLET | Freq: Every day | ORAL | 0 refills | Status: AC

## 2016-05-24 MED ORDER — DIPHENHYDRAMINE HCL 50 MG/ML IJ SOLN
50.0000 mg | Freq: Once | INTRAMUSCULAR | Status: AC
  Administered 2016-05-24: 50 mg via INTRAVENOUS

## 2016-05-24 MED ORDER — ALBUTEROL SULFATE (2.5 MG/3ML) 0.083% IN NEBU
2.5000 mg | INHALATION_SOLUTION | Freq: Once | RESPIRATORY_TRACT | Status: DC | PRN
  Filled 2016-05-24: qty 3

## 2016-05-24 MED ORDER — ACETAMINOPHEN 325 MG PO TABS
650.0000 mg | ORAL_TABLET | Freq: Once | ORAL | Status: AC
  Administered 2016-05-24: 650 mg via ORAL

## 2016-05-24 MED ORDER — SODIUM CHLORIDE 0.9% FLUSH
10.0000 mL | INTRAVENOUS | Status: DC | PRN
  Administered 2016-05-24: 10 mL
  Filled 2016-05-24: qty 10

## 2016-05-24 MED ORDER — SODIUM CHLORIDE 0.9 % IV SOLN
Freq: Once | INTRAVENOUS | Status: AC
  Administered 2016-05-24: 09:00:00 via INTRAVENOUS

## 2016-05-24 MED ORDER — ACETAMINOPHEN 325 MG PO TABS
ORAL_TABLET | ORAL | Status: AC
  Filled 2016-05-24: qty 2

## 2016-05-24 MED ORDER — DEXAMETHASONE SODIUM PHOSPHATE 10 MG/ML IJ SOLN
10.0000 mg | Freq: Once | INTRAMUSCULAR | Status: AC
  Administered 2016-05-24: 10 mg via INTRAVENOUS

## 2016-05-24 MED ORDER — DENOSUMAB 120 MG/1.7ML ~~LOC~~ SOLN
120.0000 mg | Freq: Once | SUBCUTANEOUS | Status: AC
  Administered 2016-05-24: 120 mg via SUBCUTANEOUS
  Filled 2016-05-24: qty 1.7

## 2016-05-24 MED ORDER — HEPARIN SOD (PORK) LOCK FLUSH 100 UNIT/ML IV SOLN
500.0000 [IU] | Freq: Once | INTRAVENOUS | Status: AC | PRN
  Administered 2016-05-24: 500 [IU]
  Filled 2016-05-24: qty 5

## 2016-05-24 MED ORDER — METHYLPREDNISOLONE SODIUM SUCC 125 MG IJ SOLR: 125.0000 mg | Freq: Once | INTRAMUSCULAR | Status: DC | PRN

## 2016-05-24 MED ORDER — MIRTAZAPINE 7.5 MG PO TABS: 7.5000 mg | ORAL_TABLET | Freq: Every day | ORAL | 1 refills | Status: DC

## 2016-05-24 MED ORDER — SODIUM CHLORIDE 0.9 % IV SOLN: Freq: Once | INTRAVENOUS | Status: DC | PRN

## 2016-05-24 MED ORDER — DIPHENHYDRAMINE HCL 50 MG/ML IJ SOLN
INTRAMUSCULAR | Status: AC
  Filled 2016-05-24: qty 1

## 2016-05-24 MED ORDER — SODIUM CHLORIDE 0.9 % IV SOLN
10.0000 mg/kg | Freq: Once | INTRAVENOUS | Status: AC
  Administered 2016-05-24: 600 mg via INTRAVENOUS
  Filled 2016-05-24: qty 50

## 2016-05-24 MED FILL — KLOR-CON M20 TABLET: 20 | 10 days supply | Qty: 20 | Fill #0

## 2016-05-24 MED FILL — MIRTAZAPINE 7.5 MG TABLET: 7.5 | 30 days supply | Qty: 30 | Fill #0

## 2016-05-24 NOTE — Telephone Encounter (Signed)
FYI "The form I received today does not have any appointments scheduled.  I only missed one treatment appoinwhich needs to be added at the end.  should not be finished with treatments yet."  Scheduled appointments are pending.  Provider will message schedulers who will coordinate flush, lab, MD F/U and treatments.  "I come every Wednesday for flush and lab.  EMS comes every Wednesday morning to get me due to mass on my bottom and needing stretcher to transport.  Can you all call me by Friday so I can call EMS to schedule."    Care Coordination note entered for scheduling.

## 2016-05-24 NOTE — Patient Instructions (Signed)
[facility] Discharge Instructions for Patients Receiving Chemotherapy  Today you received the following chemotherapy agents Cyramza and Taxotere To help prevent nausea and vomiting after your treatment, we encourage you to take your nausea medication as directed   If you develop nausea and vomiting that is not controlled by your nausea medication, call the clinic.   BELOW ARE SYMPTOMS THAT SHOULD BE REPORTED IMMEDIATELY:  *FEVER GREATER THAN 100.5 F  *CHILLS WITH OR WITHOUT FEVER  NAUSEA AND VOMITING THAT IS NOT CONTROLLED WITH YOUR NAUSEA MEDICATION  *UNUSUAL SHORTNESS OF BREATH  *UNUSUAL BRUISING OR BLEEDING  TENDERNESS IN MOUTH AND THROAT WITH OR WITHOUT PRESENCE OF ULCERS  *URINARY PROBLEMS  *BOWEL PROBLEMS  UNUSUAL RASH Items with * indicate a potential emergency and should be followed up as soon as possible.  Feel free to call the clinic you have any questions or concerns. The clinic phone number is [phone].  Please show the CHEMO ALERT CARD at check-in to the Emergency Department and triage nurse.   

## 2016-05-24 NOTE — Progress Notes (Signed)
Internal Medicine Clinic Attending  Case discussed with Dr. Genene Churn at the time of the visit.  We reviewed the resident's history and exam and pertinent patient test results.  I agree with the assessment, diagnosis, and plan of care documented in the resident's note.  Potassium has now normalized. No further work up for now

## 2016-05-24 NOTE — Progress Notes (Signed)
[place] [phone]   [phone]  OFFICE PROGRESS NOTE  [name], MD [place] [address]  DIAGNOSIS: Stage IV (T1a, N2, M1 B) non-small cell lung cancer, adenocarcinoma with negative EGFR mutation and presented with a right hilar mass, subcarinal lymphadenopathy as well as a large soft tissue mass in the posterior iliac bone and metastatic bone lesions diagnosed in August 2016.  PRIOR THERAPY: 1) Status post palliative radiotherapy to the soft tissue mass in the left iliac bone under the care of Dr. Valere Dross. 2) Systemic chemotherapy with carboplatin for AUC of 5 and Alimta 500 MG/M2 every 3 weeks. First dose expected on 02/22/2015. Status post 6 cycles. Starting from cycle #3 Alimta was reduced to 400 MG/M2 secondary to neutropenia after the first 2 cycles. 3) Maintenance systemic chemotherapy with single agent Alimta 500 MG/M2 every 3 weeks. First dose 07/19/2015. Status post 3 cycles. Last dose was given 08/30/2015 discontinued secondary to disease progression. 4)  Immunotherapy treatment with Tecentriq (Atezolizumab) 1200 MG IV every 3 weeks, for his dose 10/14/2015. Status post 6 cycles, discontinued secondary to disease progression. 5) whole brain irradiation for multiple new brain metastasis expected to be completed on 02/12/2016. 6) palliative radiotherapy to the progressive bone lesion in the left hip  CURRENT THERAPY:  1) Systemic chemotherapy with docetaxel 75 MG/M2 and Cyramza 10 MG/KG every 3 weeks, status post 3 cycles. First dose was given 03/01/2016. Starting from cycle #3 her dose of docetaxel was reduced to 65 MG/M2 2) Xgeva 120 mg subcutaneously on monthly basis for metastatic bone disease  INTERVAL HISTORY: Andrea [name] 46 y.o. female came to the clinic today for follow-up visit. The patient was seen at the chemotherapy treatment area. She is feeling a little bit better after receiving palliative radiotherapy  to the progressive bone lesion in the left hip area. She tolerated the last cycle of her systemic chemotherapy with docetaxel and Cyramza fairly well. She has no fever or chills. She has occasional nausea improved with Zyprexa. She denied having any significant weight loss or night sweats. The patient denied having any chest pain, shortness breath, cough or hemoptysis. She is here today for evaluation before starting cycle #4.   MEDICAL HISTORY: Past Medical History:  Diagnosis Date  . Adrenal mass, left ([place]) 09/21/2015  . Anginal pain ([place])    chest pain not sure what related to  . Anxiety   . Bone cancer ([place])    lung ca with bone mets  . Chronic back pain   . Depression   . Dysmenorrhea 12/06/2009   Qualifier: Diagnosis of  By: [name] RN, Regino Schultze    . Encounter for antineoplastic immunotherapy 10/07/2015  . Family history of adverse reaction to anesthesia    mother stopped breathing after surgery  . Goals of care, counseling/discussion 05/03/2016  . Headache    stress  . Low back pain radiating to left leg 11/29/2010  . Non-small cell carcinoma of right lung, stage 4 ([place]) 01/01/2015  . S/P radiation therapy 01/01/2015 through 01/21/2015     Left iliac bone 3500 cGy in 14 sessions   . Shortness of breath dyspnea    with exertion, Pt denies 02/2015    ALLERGIES:  is allergic to morphine and related and hydrocodone.  MEDICATIONS:  Current Outpatient Prescriptions  Medication Sig Dispense Refill  . amoxicillin (AMOXIL) 500 MG tablet Take 1 tablet (500 mg total) by mouth 2 (two) times daily. 10  tablet 0  . dexamethasone (DECADRON) 4 MG tablet 2 tablet by mouth twice a day the day before, day of and day after the chemotherapy every 3 weeks (Patient not taking: Reported on 05/03/2016) 60 tablet 1  . diclofenac sodium (VOLTAREN) 1 % GEL Apply 4 g topically 4 (four) times daily. 1 Tube 2  . fentaNYL (DURAGESIC - DOSED  MCG/HR) 12 MCG/HR Place 1 patch (12.5 mcg total) onto the skin every 3 (three) days. 10 patch 0  . fentaNYL (DURAGESIC - DOSED MCG/HR) 50 MCG/HR Place 1 patch (50 mcg total) onto the skin every 3 (three) days. 10 patch 0  . levETIRAcetam (KEPPRA) 1000 MG tablet Take 1 tablet (1,000 mg total) by mouth 2 (two) times daily. (Patient not taking: Reported on 05/03/2016) 60 tablet 0  . lidocaine-prilocaine (EMLA) cream Apply 1 teaspoon to skin 1 to 2 hours before use cover to keep in contact with skin (Patient not taking: Reported on 05/03/2016) 30 g PRN  . loratadine (CLARITIN) 10 MG tablet Take 1 tablet (10 mg total) by mouth daily. (Patient not taking: Reported on 05/03/2016)    . mirtazapine (REMERON) 7.5 MG tablet Take by mouth.    . Multiple Vitamins-Minerals (MULTIVITAMIN WITH MINERALS) tablet Take by mouth.    . OLANZapine (ZYPREXA) 10 MG tablet Take 1 tablet (10 mg total) by mouth at bedtime. 30 tablet 0  . oxyCODONE-acetaminophen (PERCOCET) 10-325 MG tablet Take 1 tablet by mouth every 6 (six) hours as needed for pain. 30 tablet 0  . pantoprazole (PROTONIX) 40 MG tablet Take by mouth.    . potassium chloride SA (KLOR-CON M20) 20 MEQ tablet Take 2 tablets (40 mEq total) by mouth daily. 20 tablet 0  . senna-docusate (SENOKOT-S) 8.6-50 MG per tablet Take 2 tablets by mouth at bedtime. (Patient not taking: Reported on 05/03/2016) 30 tablet 0   No current facility-administered medications for this visit.    Facility-Administered Medications Ordered in Other Visits  Medication Dose Route Frequency Provider Last Rate Last Dose  . DOCEtaxel (TAXOTERE) 110 mg in dextrose 5 % 250 mL chemo infusion  65 mg/m2 (Treatment Plan Recorded) Intravenous Once Curt Bears, MD      . heparin lock flush 100 unit/mL  500 Units Intracatheter Once PRN Curt Bears, MD      . pegfilgrastim (NEULASTA ONPRO KIT) injection 6 mg  6 mg Subcutaneous Once Curt Bears, MD      . ramucirumab [facility]) 600 mg in sodium  chloride 0.9 % 190 mL chemo infusion  10 mg/kg (Treatment Plan Recorded) Intravenous Once Curt Bears, MD      . sodium chloride flush (NS) 0.9 % injection 10 mL  10 mL Intracatheter PRN Curt Bears, MD        SURGICAL HISTORY:  Past Surgical History:  Procedure Laterality Date  . laproscopy surgery to check her tubes    . LUMBAR LAMINECTOMY/DECOMPRESSION MICRODISCECTOMY Left 05/01/2014   Procedure: Microdiscectomy - L5-S1 - left;  Surgeon: Eustace Moore, MD;  Location: [facility];  Service: Neurosurgery;  Laterality: Left;    REVIEW OF SYSTEMS:  Constitutional: positive for fatigue Eyes: negative Ears, nose, mouth, throat, and face: negative Respiratory: negative Cardiovascular: negative Gastrointestinal: positive for nausea Genitourinary:negative Integument/breast: negative Hematologic/lymphatic: negative Musculoskeletal:positive for bone pain and muscle weakness Neurological: negative Behavioral/Psych: negative Endocrine: negative Allergic/Immunologic: negative   PHYSICAL EXAMINATION: General appearance: alert, cooperative, fatigued and no distress Head: Normocephalic, without obvious abnormality, atraumatic Neck: no adenopathy, no JVD, supple, symmetrical, trachea midline and  thyroid not enlarged, symmetric, no tenderness/mass/nodules Lymph nodes: Cervical, supraclavicular, and axillary nodes normal. Resp: clear to auscultation bilaterally Back: symmetric, no curvature. ROM normal. No CVA tenderness. Cardio: regular rate and rhythm, S1, S2 normal, no murmur, click, rub or gallop GI: soft, non-tender; bowel sounds normal; no masses,  no organomegaly Extremities: extremities normal, atraumatic, no cyanosis or edema Neurologic: Alert and oriented X 3, normal strength and tone. Normal symmetric reflexes. Normal coordination and gait  ECOG PERFORMANCE STATUS: 1 - Symptomatic but completely ambulatory  Blood pressure 126/85, pulse 63, temperature 97.8 F (36.6 C), temperature  source Oral, resp. rate 18, height 5' 6"  (1.676 m), SpO2 100 %.  LABORATORY DATA: Lab Results  Component Value Date   WBC 5.3 05/24/2016   HGB 10.6 (L) 05/24/2016   HCT 31.5 (L) 05/24/2016   MCV 96.9 05/24/2016   PLT 336 05/24/2016      Chemistry      Component Value Date/Time   NA 144 05/24/2016 0808   K 3.8 05/24/2016 0808   CL 108 (H) 05/22/2016 1546   CO2 20 (L) 05/24/2016 0808   BUN 6.3 (L) 05/24/2016 0808   CREATININE 0.7 05/24/2016 0808      Component Value Date/Time   CALCIUM 9.6 05/24/2016 0808   ALKPHOS 64 05/24/2016 0808   AST 10 05/24/2016 0808   ALT 7 05/24/2016 0808   BILITOT <0.22 05/24/2016 0808       RADIOGRAPHIC STUDIES: No results found.  ASSESSMENT AND PLAN:  This is a very pleasant 46 years old African-American female with metastatic non-small cell lung cancer, adenocarcinoma with bone and brain metastasis. The patient underwent whole brain irradiation as well as palliative radiotherapy to the metastatic disease in the left hip area twice. She is feeling a little bit better today. She is currently undergoing systemic chemotherapy with reduced dose docetaxel and is status post 3 cycles. She is tolerating the treatment much better after reducing the dose of docetaxel. I recommended for her to proceed with cycle #4 today as a scheduled. For the chemotherapy-induced nausea she will continue on treatment with Zyprexa daily at bedtime. For the hypokalemia, the patient will continue her current treatment with potassium chloride supplement. For pain management she is currently on fentanyl patch and Percocet. She is also using Voltaren gel topically. For the depression she will continue on treatment with Remeron and I gave her refill of this medication today. The patient would come back for follow-up visit in 3 weeks for evaluation before starting cycle #5 of her treatment. She was advised to call immediately if she has any concerning symptoms in the  interval. The patient voices understanding of current disease status and treatment options and is in agreement with the current care plan.  All questions were answered. The patient knows to call the clinic with any problems, questions or concerns. We can certainly see the patient much sooner if necessary.  Disclaimer: This note was dictated with voice recognition software. Similar sounding words can inadvertently be transcribed and may not be corrected upon review.

## 2016-05-29 ENCOUNTER — Telehealth: Payer: Self-pay | Admitting: *Deleted

## 2016-05-29 NOTE — Telephone Encounter (Signed)
Per 1/361 LOS and staff message I have scheduled appts and notified the scheduler

## 2016-05-30 ENCOUNTER — Telehealth: Payer: Self-pay | Admitting: Internal Medicine

## 2016-05-30 NOTE — Telephone Encounter (Signed)
Spoke with patient re 2/7, 2/14 and 2/21 appointments.

## 2016-05-31 ENCOUNTER — Ambulatory Visit (HOSPITAL_BASED_OUTPATIENT_CLINIC_OR_DEPARTMENT_OTHER): Payer: Medicaid Other

## 2016-05-31 ENCOUNTER — Other Ambulatory Visit (HOSPITAL_BASED_OUTPATIENT_CLINIC_OR_DEPARTMENT_OTHER): Payer: Medicaid Other

## 2016-05-31 ENCOUNTER — Other Ambulatory Visit: Payer: Self-pay | Admitting: Medical Oncology

## 2016-05-31 VITALS — BP 82/59 | HR 93 | Temp 98.0°F | Resp 18

## 2016-05-31 DIAGNOSIS — Z95828 Presence of other vascular implants and grafts: Secondary | ICD-10-CM

## 2016-05-31 DIAGNOSIS — C3491 Malignant neoplasm of unspecified part of right bronchus or lung: Secondary | ICD-10-CM

## 2016-05-31 DIAGNOSIS — M898X9 Other specified disorders of bone, unspecified site: Secondary | ICD-10-CM

## 2016-05-31 DIAGNOSIS — C7951 Secondary malignant neoplasm of bone: Secondary | ICD-10-CM

## 2016-05-31 DIAGNOSIS — C3490 Malignant neoplasm of unspecified part of unspecified bronchus or lung: Secondary | ICD-10-CM

## 2016-05-31 LAB — COMPREHENSIVE METABOLIC PANEL
ALBUMIN: 3.4 g/dL — AB (ref 3.5–5.0)
ALT: 21 U/L (ref 0–55)
AST: 17 U/L (ref 5–34)
Alkaline Phosphatase: 76 U/L (ref 40–150)
Anion Gap: 8 mEq/L (ref 3–11)
BILIRUBIN TOTAL: 0.45 mg/dL (ref 0.20–1.20)
BUN: 6.4 mg/dL — AB (ref 7.0–26.0)
CALCIUM: 9.1 mg/dL (ref 8.4–10.4)
CHLORIDE: 106 meq/L (ref 98–109)
CO2: 21 mEq/L — ABNORMAL LOW (ref 22–29)
Creatinine: 0.7 mg/dL (ref 0.6–1.1)
EGFR: >90 mL/min/{1.73_m2} (ref >90)
Glucose: 86 mg/dl (ref 70–140)
Potassium: 4.1 mEq/L (ref 3.5–5.1)
Sodium: 135 mEq/L — ABNORMAL LOW (ref 136–145)
TOTAL PROTEIN: 7.1 g/dL (ref 6.4–8.3)

## 2016-05-31 LAB — CBC WITH DIFFERENTIAL/PLATELET
BASO%: 1 % (ref 0.0–2.0)
Basophils Absolute: 0.1 10*3/uL (ref 0.0–0.1)
EOS%: 0.3 % (ref 0.0–7.0)
Eosinophils Absolute: 0 10*3/uL (ref 0.0–0.5)
HEMATOCRIT: 35.2 % (ref 34.8–46.6)
HEMOGLOBIN: 11.8 g/dL (ref 11.6–15.9)
LYMPH#: 1.3 10*3/uL (ref 0.9–3.3)
LYMPH%: 24.9 % (ref 14.0–49.7)
MCH: 32.4 pg (ref 25.1–34.0)
MCHC: 33.5 g/dL (ref 31.5–36.0)
MCV: 96.7 fL (ref 79.5–101.0)
MONO#: 1.3 10*3/uL — AB (ref 0.1–0.9)
MONO%: 24.3 % — ABNORMAL HIGH (ref 0.0–14.0)
NEUT#: 2.7 10*3/uL (ref 1.5–6.5)
NEUT%: 49.5 % (ref 38.4–76.8)
Platelets: 249 10*3/uL (ref 145–400)
RBC: 3.64 10*6/uL — ABNORMAL LOW (ref 3.70–5.45)
RDW: 18.2 % — ABNORMAL HIGH (ref 11.2–14.5)
WBC: 5.4 10*3/uL (ref 3.9–10.3)

## 2016-05-31 MED ORDER — OXYCODONE-ACETAMINOPHEN 10-325 MG PO TABS: 1.0000 | ORAL_TABLET | Freq: Four times a day (QID) | ORAL | 0 refills | Status: DC | PRN

## 2016-05-31 MED ORDER — SODIUM CHLORIDE 0.9 % IJ SOLN
10.0000 mL | INTRAMUSCULAR | Status: DC | PRN
  Administered 2016-05-31: 10 mL via INTRAVENOUS
  Filled 2016-05-31: qty 10

## 2016-05-31 MED ORDER — FENTANYL 12 MCG/HR TD PT72: 12.5000 ug | MEDICATED_PATCH | TRANSDERMAL | 0 refills | Status: DC

## 2016-05-31 MED ORDER — FENTANYL 50 MCG/HR TD PT72: 50.0000 ug | MEDICATED_PATCH | TRANSDERMAL | 0 refills | Status: DC

## 2016-05-31 MED ORDER — HEPARIN SOD (PORK) LOCK FLUSH 100 UNIT/ML IV SOLN
500.0000 [IU] | Freq: Once | INTRAVENOUS | Status: AC | PRN
  Administered 2016-05-31: 500 [IU] via INTRAVENOUS
  Filled 2016-05-31: qty 5

## 2016-05-31 NOTE — Patient Instructions (Signed)

## 2016-05-31 NOTE — Progress Notes (Signed)
Patient's  BP 82/59 hear trate 93, asymptomatic, eating and drinking, patient states she "feels normal and fine". Per [name] RN for Dr Julien Nordmann, BP acknowledged and labs reviewed, no new treatment, de-access PAC and discharge.

## 2016-06-01 MED FILL — OXYCODONE-APAP 10-325: 10-325 | 8 days supply | Qty: 30 | Fill #0

## 2016-06-07 ENCOUNTER — Ambulatory Visit (HOSPITAL_BASED_OUTPATIENT_CLINIC_OR_DEPARTMENT_OTHER): Payer: Medicaid Other

## 2016-06-07 ENCOUNTER — Other Ambulatory Visit (HOSPITAL_BASED_OUTPATIENT_CLINIC_OR_DEPARTMENT_OTHER): Payer: Medicaid Other

## 2016-06-07 DIAGNOSIS — M898X9 Other specified disorders of bone, unspecified site: Secondary | ICD-10-CM

## 2016-06-07 DIAGNOSIS — C3491 Malignant neoplasm of unspecified part of right bronchus or lung: Secondary | ICD-10-CM

## 2016-06-07 DIAGNOSIS — Z95828 Presence of other vascular implants and grafts: Secondary | ICD-10-CM

## 2016-06-07 MED ORDER — SODIUM CHLORIDE 0.9 % IJ SOLN
10.0000 mL | INTRAMUSCULAR | Status: DC | PRN
  Administered 2016-06-07: 10 mL via INTRAVENOUS
  Filled 2016-06-07: qty 10

## 2016-06-08 ENCOUNTER — Ambulatory Visit
Admission: RE | Admit: 2016-06-08 | Discharge: 2016-06-08 | Disposition: A | Discharge status: Home / Self Care | Payer: Medicaid Other | Source: Ambulatory Visit | Attending: Radiation Oncology | Admitting: Radiation Oncology

## 2016-06-08 ENCOUNTER — Other Ambulatory Visit: Payer: Self-pay | Admitting: Radiation Therapy

## 2016-06-08 VITALS — BP 117/79 | HR 110 | Resp 18 | Wt 147.0 lb

## 2016-06-08 DIAGNOSIS — C7949 Secondary malignant neoplasm of other parts of nervous system: Principal | ICD-10-CM

## 2016-06-08 DIAGNOSIS — Z79899 Other long term (current) drug therapy: Secondary | ICD-10-CM | POA: Diagnosis not present

## 2016-06-08 DIAGNOSIS — Y842 Radiological procedure and radiotherapy as the cause of abnormal reaction of the patient, or of later complication, without mention of misadventure at the time of the procedure: Secondary | ICD-10-CM | POA: Insufficient documentation

## 2016-06-08 DIAGNOSIS — C3491 Malignant neoplasm of unspecified part of right bronchus or lung: Secondary | ICD-10-CM | POA: Insufficient documentation

## 2016-06-08 DIAGNOSIS — C7931 Secondary malignant neoplasm of brain: Secondary | ICD-10-CM

## 2016-06-08 DIAGNOSIS — C7951 Secondary malignant neoplasm of bone: Secondary | ICD-10-CM | POA: Diagnosis not present

## 2016-06-08 DIAGNOSIS — R200 Anesthesia of skin: Secondary | ICD-10-CM | POA: Insufficient documentation

## 2016-06-08 DIAGNOSIS — M25552 Pain in left hip: Secondary | ICD-10-CM | POA: Diagnosis not present

## 2016-06-08 NOTE — Progress Notes (Signed)
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 Metastasis to brain (HCC) 198.3 C79.31   2. Bone metastases (HCC) 198.5 C79.51   3. Non-small cell carcinoma of right lung, stage 4 (HCC) 162.9 C34.91     Interval Since Last Radiation:  1 month 04/19/16 - 05/03/16 : Left hip treated to 30 Gy in 10 fractions  Narrative:  The patient returns today for routine follow-up of radiation completed on 05/03/16.  On review of systems, the patient reports left hip pain 6/10 in severity despite Fentanyl and Percocet. Denies taking any Decadron at this time. Reports approximately one month ago she fell on her left hip. She reports a new area of pain on her lateral right femur. Patient vomited upon arrival here today. Reports numbness in her feet she believe is related to chemotherapy. Reports ambulating at home with the aid of a walker, cane, or crutches. Denies any bowel or bladder complaints. Reports lightheadedness. Denies headaches. Reports diminished and blurry vision. Denies diplopia. Denies tinnitus. Questions when next MRI is scheduled for. The patient is scheduled for chemotherapy next week, and she believes this is her last round.    ALLERGIES:  is allergic to morphine and related and hydrocodone.  Meds: Current Outpatient Prescriptions  Medication Sig Dispense Refill  . diclofenac sodium (VOLTAREN) 1 % GEL Apply 4 g topically 4 (four) times daily. 1 Tube 2  . fentaNYL (DURAGESIC - DOSED MCG/HR) 12 MCG/HR Place 1 patch (12.5 mcg total) onto the skin every 3 (three) days. 10 patch 0  . fentaNYL (DURAGESIC - DOSED MCG/HR) 50 MCG/HR Place 1 patch (50 mcg total) onto the skin every 3 (three) days. 10 patch 0  . OLANZapine (ZYPREXA)  10 MG tablet Take 1 tablet (10 mg total) by mouth at bedtime. 30 tablet 0  . oxyCODONE-acetaminophen (PERCOCET) 10-325 MG tablet Take 1 tablet by mouth every 6 (six) hours as needed for pain. 30 tablet 0  . senna-docusate (SENOKOT-S) 8.6-50 MG tablet Take 2 tablets by mouth at bedtime. 30 tablet 0  . dexamethasone (DECADRON) 4 MG tablet 2 tablet by mouth twice a day the day before, day of and day after the chemotherapy every 3 weeks (Patient not taking: Reported on 05/03/2016) 60 tablet 1  . levETIRAcetam (KEPPRA) 1000 MG tablet Take 1 tablet (1,000 mg total) by mouth 2 (two) times daily. (Patient not taking: Reported on 05/03/2016) 60 tablet 0  . lidocaine-prilocaine (EMLA) cream Apply 1 teaspoon to skin 1 to 2 hours before use cover to keep in contact with skin (Patient not taking: Reported on 05/03/2016) 30 g PRN  . loratadine (CLARITIN) 10 MG tablet Take 1 tablet (10 mg total) by mouth daily. (Patient not taking: Reported on 05/03/2016)    . mirtazapine (REMERON) 7.5 MG tablet Take 1 tablet (7.5 mg total) by mouth at bedtime. (Patient not taking: Reported on 06/08/2016) 30 tablet 1  . Multiple Vitamins-Minerals (MULTIVITAMIN WITH MINERALS) tablet Take by mouth.    . pantoprazole (PROTONIX) 40 MG tablet Take by mouth.    . potassium chloride SA (KLOR-CON M20) 20 MEQ tablet Take 2 tablets (40 mEq total) by mouth daily. 20 tablet 0   No current facility-administered medications for  this encounter.     Physical Findings: The patient is in no acute distress. Patient is alert and oriented.  weight is 147 lb (66.7 kg). Her blood pressure is 117/79 and her pulse is 110 (abnormal). Her respiration is 18 and oxygen saturation is 100%.  No significant changes. Soft tissue nodule in the right lateral thigh which is sensitive to palpation. Tenderness in the left pelvic area where the patient received treatment.  Lab Findings: Lab Results  Component Value Date   WBC 5.4 05/31/2016   WBC 2.9 (L) 03/08/2015     HGB 11.8 05/31/2016   HCT 35.2 05/31/2016   PLT 249 05/31/2016    Lab Results  Component Value Date   NA 135 (L) 05/31/2016   K 4.1 05/31/2016   CHLORIDE 106 05/31/2016   CO2 21 (L) 05/31/2016   GLUCOSE 86 05/31/2016   BUN 6.4 (L) 05/31/2016   CREATININE 0.7 05/31/2016   BILITOT 0.45 05/31/2016   ALKPHOS 76 05/31/2016   AST 17 05/31/2016   ALT 21 05/31/2016   PROT 7.1 05/31/2016   ALBUMIN 3.4 (L) 05/31/2016   CALCIUM 9.1 05/31/2016   ANIONGAP 8 05/31/2016   ANIONGAP 8 03/08/2015    Radiographic Findings: No results found.  Impression:  The patient is recovering from the effects of radiation.  Plan:  The patient will be scheduled for a brain MRI, with subsequent follow up in radiation oncology. We will continue to monitor the area of new pain onset in the patient's right lateral thigh.  _____________________________________  Sheral Apley. Tammi Klippel, M.D.  This document serves as a record of services personally performed by Tyler Pita, MD. It was created on his behalf by Maryla Morrow, a trained medical scribe. The creation of this record is based on the scribe's personal observations and the provider's statements to them. This document has been checked and approved by the attending provider.

## 2016-06-08 NOTE — Progress Notes (Signed)
Weight and vitals stable. Reports left hip pain 6 on a scale of 0-10 despite Fentanyl and Percocet. Denies taking any decadron at this time. Reports approximately one month ago she fell on her left hip. Patient vomited upon arrival here today. Scheduled for chemotherapy this coming Wednesday and believes this is her last round. Palpable lesion right lateral femur of new onset. No edema noted right leg. Reports numbness in her feet she believe is related to chemotherapy. Reports ambulating at home with the aid of a walker, cane or crutches. Denies any bowel or bladder complaints. Reports lightheadedness. Denies headaches. Reports diminished and blurry vision. Denies diplopia. Denies tinnitus. Questions when next MRI is scheduled for.   BP 117/79 (BP Location: Left Arm, Patient Position: Sitting, Cuff Size: Normal)   Pulse (!) 110   Resp 18   Wt 147 lb (66.7 kg)   SpO2 100%   BMI 23.73 kg/m  Wt Readings from Last 3 Encounters:  06/08/16 147 lb (66.7 kg)  05/22/16 146 lb 8 oz (66.5 kg)  05/03/16 136 lb (61.7 kg)

## 2016-06-08 NOTE — Addendum Note (Signed)
Encounter addended by: Heywood Footman, RN on: 06/08/2016  4:40 PM<BR>    Actions taken: Charge Capture section accepted

## 2016-06-09 ENCOUNTER — Telehealth: Payer: Self-pay | Admitting: *Deleted

## 2016-06-09 NOTE — Telephone Encounter (Signed)
Called patient to inform of fu appt. On 06-19-16 @ 9 am with Dr. Tammi Klippel, lvm for a return call

## 2016-06-12 ENCOUNTER — Other Ambulatory Visit: Payer: Self-pay | Admitting: Internal Medicine

## 2016-06-12 DIAGNOSIS — R921 Mammographic calcification found on diagnostic imaging of breast: Secondary | ICD-10-CM

## 2016-06-14 ENCOUNTER — Ambulatory Visit (HOSPITAL_BASED_OUTPATIENT_CLINIC_OR_DEPARTMENT_OTHER): Payer: Medicaid Other

## 2016-06-14 ENCOUNTER — Other Ambulatory Visit: Payer: Self-pay

## 2016-06-14 ENCOUNTER — Telehealth: Payer: Self-pay | Admitting: Medical Oncology

## 2016-06-14 ENCOUNTER — Encounter: Payer: Self-pay | Admitting: Internal Medicine

## 2016-06-14 ENCOUNTER — Ambulatory Visit (HOSPITAL_BASED_OUTPATIENT_CLINIC_OR_DEPARTMENT_OTHER): Payer: Medicaid Other | Admitting: Internal Medicine

## 2016-06-14 ENCOUNTER — Other Ambulatory Visit (HOSPITAL_BASED_OUTPATIENT_CLINIC_OR_DEPARTMENT_OTHER): Payer: Medicaid Other

## 2016-06-14 ENCOUNTER — Encounter: Payer: Self-pay | Admitting: *Deleted

## 2016-06-14 VITALS — BP 119/68 | HR 88 | Temp 98.0°F | Resp 17

## 2016-06-14 DIAGNOSIS — R5382 Chronic fatigue, unspecified: Secondary | ICD-10-CM

## 2016-06-14 DIAGNOSIS — C7931 Secondary malignant neoplasm of brain: Secondary | ICD-10-CM

## 2016-06-14 DIAGNOSIS — C7951 Secondary malignant neoplasm of bone: Secondary | ICD-10-CM | POA: Diagnosis not present

## 2016-06-14 DIAGNOSIS — C3491 Malignant neoplasm of unspecified part of right bronchus or lung: Secondary | ICD-10-CM

## 2016-06-14 DIAGNOSIS — Z5111 Encounter for antineoplastic chemotherapy: Secondary | ICD-10-CM | POA: Diagnosis not present

## 2016-06-14 DIAGNOSIS — Z5112 Encounter for antineoplastic immunotherapy: Secondary | ICD-10-CM

## 2016-06-14 DIAGNOSIS — D701 Agranulocytosis secondary to cancer chemotherapy: Secondary | ICD-10-CM | POA: Diagnosis not present

## 2016-06-14 DIAGNOSIS — C3490 Malignant neoplasm of unspecified part of unspecified bronchus or lung: Secondary | ICD-10-CM

## 2016-06-14 LAB — CBC WITH DIFFERENTIAL/PLATELET
BASO%: 0.2 % (ref 0.0–2.0)
BASOS ABS: 0 10*3/uL (ref 0.0–0.1)
EOS ABS: 0 10*3/uL (ref 0.0–0.5)
EOS%: 0 % (ref 0.0–7.0)
HEMATOCRIT: 32.1 % — AB (ref 34.8–46.6)
HGB: 10.8 g/dL — ABNORMAL LOW (ref 11.6–15.9)
LYMPH%: 13.6 % — AB (ref 14.0–49.7)
MCH: 33.1 pg (ref 25.1–34.0)
MCHC: 33.7 g/dL (ref 31.5–36.0)
MCV: 98.4 fL (ref 79.5–101.0)
MONO#: 0.3 10*3/uL (ref 0.1–0.9)
MONO%: 5.3 % (ref 0.0–14.0)
NEUT#: 4.3 10*3/uL (ref 1.5–6.5)
NEUT%: 80.9 % — ABNORMAL HIGH (ref 38.4–76.8)
PLATELETS: 320 10*3/uL (ref 145–400)
RBC: 3.26 10*6/uL — AB (ref 3.70–5.45)
RDW: 18.3 % — AB (ref 11.2–14.5)
WBC: 5.3 10*3/uL (ref 3.9–10.3)
lymph#: 0.7 10*3/uL — ABNORMAL LOW (ref 0.9–3.3)

## 2016-06-14 LAB — COMPREHENSIVE METABOLIC PANEL
ALT: 7 U/L (ref 0–55)
ANION GAP: 11 meq/L (ref 3–11)
AST: 10 U/L (ref 5–34)
Albumin: 3.4 g/dL — ABNORMAL LOW (ref 3.5–5.0)
Alkaline Phosphatase: 51 U/L (ref 40–150)
BUN: 5.5 mg/dL — ABNORMAL LOW (ref 7.0–26.0)
CALCIUM: 10 mg/dL (ref 8.4–10.4)
CO2: 17 mEq/L — ABNORMAL LOW (ref 22–29)
CREATININE: 0.8 mg/dL (ref 0.6–1.1)
Chloride: 109 mEq/L (ref 98–109)
Glucose: 159 mg/dl — ABNORMAL HIGH (ref 70–140)
POTASSIUM: 4.2 meq/L (ref 3.5–5.1)
Sodium: 138 mEq/L (ref 136–145)
Total Bilirubin: 0.29 mg/dL (ref 0.20–1.20)
Total Protein: 6.7 g/dL (ref 6.4–8.3)

## 2016-06-14 MED ORDER — DEXAMETHASONE SODIUM PHOSPHATE 10 MG/ML IJ SOLN
INTRAMUSCULAR | Status: AC
  Filled 2016-06-14: qty 1

## 2016-06-14 MED ORDER — MIRTAZAPINE 7.5 MG PO TABS: 7.5000 mg | ORAL_TABLET | Freq: Every day | ORAL | 1 refills | Status: DC

## 2016-06-14 MED ORDER — SODIUM CHLORIDE 0.9 % IV SOLN
10.0000 mg/kg | Freq: Once | INTRAVENOUS | Status: AC
  Administered 2016-06-14: 600 mg via INTRAVENOUS
  Filled 2016-06-14: qty 50

## 2016-06-14 MED ORDER — DIPHENHYDRAMINE HCL 50 MG/ML IJ SOLN
INTRAMUSCULAR | Status: AC
  Filled 2016-06-14: qty 1

## 2016-06-14 MED ORDER — DOCETAXEL CHEMO INJECTION 160 MG/16ML
65.0000 mg/m2 | Freq: Once | INTRAVENOUS | Status: AC
  Administered 2016-06-14: 110 mg via INTRAVENOUS
  Filled 2016-06-14: qty 11

## 2016-06-14 MED ORDER — OXYCODONE-ACETAMINOPHEN 10-325 MG PO TABS: 1.0000 | ORAL_TABLET | Freq: Four times a day (QID) | ORAL | 0 refills | Status: DC | PRN

## 2016-06-14 MED ORDER — DEXAMETHASONE 4 MG PO TABS: ORAL_TABLET | ORAL | 1 refills | Status: DC

## 2016-06-14 MED ORDER — ACETAMINOPHEN 325 MG PO TABS
ORAL_TABLET | ORAL | Status: AC
  Filled 2016-06-14: qty 2

## 2016-06-14 MED ORDER — PEGFILGRASTIM 6 MG/0.6ML ~~LOC~~ PSKT
6.0000 mg | PREFILLED_SYRINGE | Freq: Once | SUBCUTANEOUS | Status: AC
  Administered 2016-06-14: 6 mg via SUBCUTANEOUS
  Filled 2016-06-14: qty 0.6

## 2016-06-14 MED ORDER — HEPARIN SOD (PORK) LOCK FLUSH 100 UNIT/ML IV SOLN
500.0000 [IU] | Freq: Once | INTRAVENOUS | Status: AC | PRN
  Administered 2016-06-14: 500 [IU]
  Filled 2016-06-14: qty 5

## 2016-06-14 MED ORDER — DEXAMETHASONE SODIUM PHOSPHATE 10 MG/ML IJ SOLN
10.0000 mg | Freq: Once | INTRAMUSCULAR | Status: AC
  Administered 2016-06-14: 10 mg via INTRAVENOUS

## 2016-06-14 MED ORDER — ACETAMINOPHEN 325 MG PO TABS
650.0000 mg | ORAL_TABLET | Freq: Once | ORAL | Status: AC
  Administered 2016-06-14: 650 mg via ORAL

## 2016-06-14 MED ORDER — SODIUM CHLORIDE 0.9% FLUSH
10.0000 mL | INTRAVENOUS | Status: DC | PRN
  Administered 2016-06-14: 10 mL
  Filled 2016-06-14: qty 10

## 2016-06-14 MED ORDER — DIPHENHYDRAMINE HCL 50 MG/ML IJ SOLN
50.0000 mg | Freq: Once | INTRAMUSCULAR | Status: AC
  Administered 2016-06-14: 50 mg via INTRAVENOUS

## 2016-06-14 MED ORDER — SODIUM CHLORIDE 0.9 % IV SOLN
Freq: Once | INTRAVENOUS | Status: AC
  Administered 2016-06-14: 09:00:00 via INTRAVENOUS

## 2016-06-14 MED FILL — fentaNYL 50 MCG/HR PT72: 50 | 30 days supply | Qty: 10 | Fill #0

## 2016-06-14 MED FILL — DEXAMETHASONE 4 MG TABLET: 4 | 21 days supply | Qty: 12 | Fill #0

## 2016-06-14 MED FILL — fentaNYL 12 MCG/HR PT72: 12 | 30 days supply | Qty: 10 | Fill #0

## 2016-06-14 MED FILL — OXYCODONE-APAP 10-325: 10-325 | 7 days supply | Qty: 30 | Fill #0

## 2016-06-14 NOTE — Progress Notes (Signed)
[place] [phone]   [phone]  OFFICE PROGRESS NOTE  [name], MD [place] [address]  DIAGNOSIS: Stage IV (T1a, N2, M1b) non-small cell lung cancer, adenocarcinoma with negative EGFR mutation and presented with a right hilar mass, subcarinal lymphadenopathy as well as a large soft tissue mass in the posterior iliac bone and metastatic bone lesions diagnosed in August 2016.  PRIOR THERAPY: 1) Status post palliative radiotherapy to the soft tissue mass in the left iliac bone under the care of Dr. Valere Dross. 2) Systemic chemotherapy with carboplatin for AUC of 5 and Alimta 500 MG/M2 every 3 weeks. First dose expected on 02/22/2015. Status post 6 cycles. Starting from cycle #3 Alimta was reduced to 400 MG/M2 secondary to neutropenia after the first 2 cycles. 3) Maintenance systemic chemotherapy with single agent Alimta 500 MG/M2 every 3 weeks. First dose 07/19/2015. Status post 3 cycles. Last dose was given 08/30/2015 discontinued secondary to disease progression. 4)  Immunotherapy treatment with Tecentriq (Atezolizumab) 1200 MG IV every 3 weeks, for his dose 10/14/2015. Status post 6 cycles, discontinued secondary to disease progression. 5) whole brain irradiation for multiple new brain metastasis expected to be completed on 02/12/2016. 6) palliative radiotherapy to the progressive bone lesion in the left hip  CURRENT THERAPY:  1) Systemic chemotherapy with docetaxel 75 MG/M2 and Cyramza 10 MG/KG every 3 weeks, status post 4 cycles. First dose was given 03/01/2016. Starting from cycle #3 her dose of docetaxel was reduced to 65 MG/M2 2) Xgeva 120 mg subcutaneously on monthly basis for metastatic bone disease  INTERVAL HISTORY: Andrea [name] 46 y.o. female was seen at the chemotherapy treatment area today for evaluation before starting her systemic chemotherapy. The patient tolerated the last cycle of her treatment well with no  significant adverse effects. She denied having any fever or chills. She denied having any nausea, vomiting, diarrhea or constipation. She has no chest pain, shortness breath, cough or hemoptysis. She continues to have pain in the right hip area but much better compared to before. She is here today for evaluation before starting the next cycle of her treatment.   MEDICAL HISTORY: Past Medical History:  Diagnosis Date  . 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 Anginal pain ([place])    chest pain not sure what related to  . Anxiety   . Bone cancer ([place])    lung ca with bone mets  . Chronic back pain   . Depression   . Dysmenorrhea 12/06/2009   Qualifier: Diagnosis of  By: [name] RN, Regino Schultze    . Encounter for antineoplastic immunotherapy 10/07/2015  . Family history of adverse reaction to anesthesia    mother stopped breathing after surgery  . Goals of care, counseling/discussion 05/03/2016  . Headache    stress  . Low back pain radiating to left leg 11/29/2010  . Non-small cell carcinoma of right lung, stage 4 ([place]) 01/01/2015  . S/P radiation therapy 01/01/2015 through 01/21/2015     Left iliac bone 3500 cGy in 14 sessions   . Shortness of breath dyspnea    with exertion, Pt denies 02/2015    ALLERGIES:  is allergic to morphine and related and hydrocodone.  MEDICATIONS:  Current Outpatient Prescriptions  Medication Sig Dispense Refill  . dexamethasone (DECADRON) 4 MG tablet 2 tablet by mouth twice a day the day before, day of and day after the chemotherapy every 3 weeks (Patient not taking: Reported on 05/03/2016)  60 tablet 1  . diclofenac sodium (VOLTAREN) 1 % GEL Apply 4 g topically 4 (four) times daily. 1 Tube 2  . fentaNYL (DURAGESIC - DOSED MCG/HR) 12 MCG/HR Place 1 patch (12.5 mcg total) onto the skin every 3 (three) days. 10 patch 0  . fentaNYL (DURAGESIC - DOSED MCG/HR) 50 MCG/HR Place 1 patch (50 mcg total) onto  the skin every 3 (three) days. 10 patch 0  . levETIRAcetam (KEPPRA) 1000 MG tablet Take 1 tablet (1,000 mg total) by mouth 2 (two) times daily. (Patient not taking: Reported on 05/03/2016) 60 tablet 0  . lidocaine-prilocaine (EMLA) cream Apply 1 teaspoon to skin 1 to 2 hours before use cover to keep in contact with skin (Patient not taking: Reported on 05/03/2016) 30 g PRN  . loratadine (CLARITIN) 10 MG tablet Take 1 tablet (10 mg total) by mouth daily. (Patient not taking: Reported on 05/03/2016)    . mirtazapine (REMERON) 7.5 MG tablet Take 1 tablet (7.5 mg total) by mouth at bedtime. (Patient not taking: Reported on 06/08/2016) 30 tablet 1  . Multiple Vitamins-Minerals (MULTIVITAMIN WITH MINERALS) tablet Take by mouth.    . OLANZapine (ZYPREXA) 10 MG tablet Take 1 tablet (10 mg total) by mouth at bedtime. 30 tablet 0  . oxyCODONE-acetaminophen (PERCOCET) 10-325 MG tablet Take 1 tablet by mouth every 6 (six) hours as needed for pain. 30 tablet 0  . pantoprazole (PROTONIX) 40 MG tablet Take by mouth.    . potassium chloride SA (KLOR-CON M20) 20 MEQ tablet Take 2 tablets (40 mEq total) by mouth daily. 20 tablet 0  . senna-docusate (SENOKOT-S) 8.6-50 MG tablet Take 2 tablets by mouth at bedtime. 30 tablet 0   No current facility-administered medications for this visit.     SURGICAL HISTORY:  Past Surgical History:  Procedure Laterality Date  . laproscopy surgery to check her tubes    . LUMBAR LAMINECTOMY/DECOMPRESSION MICRODISCECTOMY Left 05/01/2014   Procedure: Microdiscectomy - L5-S1 - left;  Surgeon: Eustace Moore, MD;  Location: [facility];  Service: Neurosurgery;  Laterality: Left;    REVIEW OF SYSTEMS:  A comprehensive review of systems was negative except for: Constitutional: positive for fatigue Musculoskeletal: positive for bone pain and muscle weakness   PHYSICAL EXAMINATION: General appearance: alert, cooperative, fatigued and no distress Head: Normocephalic, without obvious abnormality,  atraumatic Neck: no adenopathy, no JVD, supple, symmetrical, trachea midline and thyroid not enlarged, symmetric, no tenderness/mass/nodules Lymph nodes: Cervical, supraclavicular, and axillary nodes normal. Resp: clear to auscultation bilaterally Back: symmetric, no curvature. ROM normal. No CVA tenderness. Cardio: regular rate and rhythm, S1, S2 normal, no murmur, click, rub or gallop GI: soft, non-tender; bowel sounds normal; no masses,  no organomegaly Extremities: extremities normal, atraumatic, no cyanosis or edema  ECOG PERFORMANCE STATUS: 1 - Symptomatic but completely ambulatory  There were no vitals taken for this visit.  LABORATORY DATA: Lab Results  Component Value Date   WBC 5.3 06/14/2016   HGB 10.8 (L) 06/14/2016   HCT 32.1 (L) 06/14/2016   MCV 98.4 06/14/2016   PLT 320 06/14/2016      Chemistry      Component Value Date/Time   NA 135 (L) 05/31/2016 1321   K 4.1 05/31/2016 1321   CL 108 (H) 05/22/2016 1546   CO2 21 (L) 05/31/2016 1321   BUN 6.4 (L) 05/31/2016 1321   CREATININE 0.7 05/31/2016 1321      Component Value Date/Time   CALCIUM 9.1 05/31/2016 1321   ALKPHOS 76 05/31/2016  1321   AST 17 05/31/2016 1321   ALT 21 05/31/2016 1321   BILITOT 0.45 05/31/2016 1321       RADIOGRAPHIC STUDIES: No results found.  ASSESSMENT AND PLAN:  This is a very pleasant 46 years old African-American female was metastatic non-small cell lung cancer, adenocarcinoma with bone and brain metastasis status post whole brain irradiation as well as palliative radiotherapy to the metastatic bone disease. The patient also underwent several chemotherapy regimens and she is currently on docetaxel and Cyramza status post 4 cycles. She tolerated the last cycle of her treatment much better. I recommended for her to proceed with cycle #5 today as a scheduled. She would have repeat CT scan of the chest, abdomen and pelvis in 3 weeks for restaging of her disease. For pain management  she will continue her current treatment with fentanyl patch as well as Percocet. I gave her refill of Percocet today. The patient was advised to call immediately if she has any concerning symptoms in the interval. The patient voices understanding of current disease status and treatment options and is in agreement with the current care plan.  All questions were answered. The patient knows to call the clinic with any problems, questions or concerns. We can certainly see the patient much sooner if necessary. I spent 10 minutes counseling the patient face to face. The total time spent in the appointment was 15 minutes.  Disclaimer: This note was dictated with voice recognition software. Similar sounding words can inadvertently be transcribed and may not be corrected upon review.

## 2016-06-14 NOTE — Progress Notes (Signed)
Oncology Nurse Navigator Documentation  Oncology Nurse Navigator Flowsheets 06/14/2016  Navigator Location [facility]-  Navigator Encounter Type Treatment/I followed up with Andrea [name] today. Andrea [name] states Andrea [name] is doing well.  I offered support and encouragement.  No needs identified at this time  Patient Visit Type MedOnc  Treatment Phase Treatment  Barriers/Navigation Needs (No Data)  Interventions Other  Acuity Level 1  Time Spent with Patient 15

## 2016-06-14 NOTE — Patient Instructions (Signed)
[facility] Discharge Instructions for Patients Receiving Chemotherapy  Today you received the following chemotherapy agents Cyramza and Taxotere To help prevent nausea and vomiting after your treatment, we encourage you to take your nausea medication as directed   If you develop nausea and vomiting that is not controlled by your nausea medication, call the clinic.   BELOW ARE SYMPTOMS THAT SHOULD BE REPORTED IMMEDIATELY:  *FEVER GREATER THAN 100.5 F  *CHILLS WITH OR WITHOUT FEVER  NAUSEA AND VOMITING THAT IS NOT CONTROLLED WITH YOUR NAUSEA MEDICATION  *UNUSUAL SHORTNESS OF BREATH  *UNUSUAL BRUISING OR BLEEDING  TENDERNESS IN MOUTH AND THROAT WITH OR WITHOUT PRESENCE OF ULCERS  *URINARY PROBLEMS  *BOWEL PROBLEMS  UNUSUAL RASH Items with * indicate a potential emergency and should be followed up as soon as possible.  Feel free to call the clinic you have any questions or concerns. The clinic phone number is [phone].  Please show the CHEMO ALERT CARD at check-in to the Emergency Department and triage nurse.   

## 2016-06-14 NOTE — Telephone Encounter (Signed)
confirmed with pt that she does not need to come back for weekly labs -her CT scan is expected march 12 and f/u march 14

## 2016-06-15 ENCOUNTER — Telehealth: Payer: Self-pay | Admitting: *Deleted

## 2016-06-15 NOTE — Telephone Encounter (Signed)
Per 2/21 LOS and staff message I have scheduled appts. Notified the scheduler

## 2016-06-16 ENCOUNTER — Ambulatory Visit (HOSPITAL_COMMUNITY)
Admission: RE | Admit: 2016-06-16 | Discharge: 2016-06-16 | Disposition: A | Discharge status: Home / Self Care | Payer: Medicaid Other | Source: Ambulatory Visit | Attending: Radiation Oncology | Admitting: Radiation Oncology

## 2016-06-16 DIAGNOSIS — J328 Other chronic sinusitis: Secondary | ICD-10-CM | POA: Diagnosis not present

## 2016-06-16 DIAGNOSIS — C7949 Secondary malignant neoplasm of other parts of nervous system: Secondary | ICD-10-CM | POA: Diagnosis not present

## 2016-06-16 DIAGNOSIS — C7931 Secondary malignant neoplasm of brain: Secondary | ICD-10-CM | POA: Insufficient documentation

## 2016-06-16 MED ORDER — GADOBENATE DIMEGLUMINE 529 MG/ML IV SOLN
13.0000 mL | Freq: Once | INTRAVENOUS | Status: AC | PRN
  Administered 2016-06-16: 13 mL via INTRAVENOUS

## 2016-06-19 ENCOUNTER — Telehealth: Payer: Self-pay | Admitting: *Deleted

## 2016-06-19 ENCOUNTER — Ambulatory Visit: Admission: RE | Admit: 2016-06-19 | Payer: Medicaid Other | Source: Ambulatory Visit | Admitting: Radiation Oncology

## 2016-06-19 ENCOUNTER — Telehealth: Payer: Self-pay | Admitting: Radiation Oncology

## 2016-06-19 ENCOUNTER — Ambulatory Visit
Admission: RE | Admit: 2016-06-19 | Discharge: 2016-06-19 | Disposition: A | Discharge status: Home / Self Care | Payer: Medicaid Other | Source: Ambulatory Visit | Attending: Internal Medicine | Admitting: Internal Medicine

## 2016-06-19 DIAGNOSIS — C3491 Malignant neoplasm of unspecified part of right bronchus or lung: Secondary | ICD-10-CM

## 2016-06-19 DIAGNOSIS — R921 Mammographic calcification found on diagnostic imaging of breast: Secondary | ICD-10-CM

## 2016-06-19 NOTE — Telephone Encounter (Signed)
Oncology Nurse Navigator Documentation  Oncology Nurse Navigator Flowsheets 06/19/2016  Navigator Location [facility]  Navigator Encounter Type Telephone/I received a message from [place] RN in [place].  She states patient is having pain in her left hip and thigh and right thigh with noted palpable mass.  I updated Dr. Julien Nordmann. He ordered CT scans.  I called Andrea [name] and updated her. She was thankful for the call.   Telephone Outgoing Call  Treatment Phase Treatment  Barriers/Navigation Needs Coordination of Care  Interventions Coordination of Care  Coordination of Care Other  Acuity Level 2  Time Spent with Patient 30

## 2016-06-19 NOTE — Telephone Encounter (Signed)
Phoned patient to inquire if she planned to present for 0900 follow up with Dr. Tammi Klippel to review MRI results. No answer. Left message. Patient returned this RN's call to report that [facility] did not pick her up as scheduled. Explained that per Dr. Tammi Klippel there were no new brain lesions seen on her recent MRI and the ones that had been present were smaller in size. Patient verbalized understanding.   Patient reports she continues to have left hip pain from the fall she endured during the snow. Also, patient reports the right lateral thigh mass noted during following up remains present.   Patient understands this RN will inform Dr. Tammi Klippel and Dr. Julien Nordmann of these findings and phone her back with next steps. Immediately informed Shona Simpson, PA-C then, Norton Blizzard, RN.

## 2016-06-20 ENCOUNTER — Telehealth: Payer: Self-pay | Admitting: Radiation Oncology

## 2016-06-20 NOTE — Telephone Encounter (Signed)
Returned patient's call. Assured her Dr. Julien Nordmann has the orders in for a CT of her chest, abd, pelvis, and femurs. Went onto reassure the patient Dr. Worthy Flank schedulers would work hard to schedule all the scans at the same time and place due to her transportation issues. Encouraged patient to wait patiently for a call from Dr. Worthy Flank schedulers with her appointments. Patient verbalized understanding and expressed appreciation for the return call.

## 2016-06-21 ENCOUNTER — Ambulatory Visit: Payer: Medicaid Other

## 2016-06-21 ENCOUNTER — Other Ambulatory Visit: Payer: Medicaid Other

## 2016-06-23 ENCOUNTER — Telehealth: Payer: Self-pay | Admitting: Medical Oncology

## 2016-06-23 ENCOUNTER — Other Ambulatory Visit: Payer: Self-pay | Admitting: Internal Medicine

## 2016-06-23 ENCOUNTER — Other Ambulatory Visit: Payer: Self-pay | Admitting: Medical Oncology

## 2016-06-23 ENCOUNTER — Telehealth: Payer: Self-pay | Admitting: Radiation Oncology

## 2016-06-23 DIAGNOSIS — R928 Other abnormal and inconclusive findings on diagnostic imaging of breast: Secondary | ICD-10-CM

## 2016-06-23 NOTE — Telephone Encounter (Signed)
Phoned patient to inquire about dex taper. Patient confirms she tapered off dex one almost two weeks ago. Patient's only complaint is left hip pain related to effects of fall. Patient inquired about future med onc appointments and expressed confusion associated with what was scheduled. Explained to the patient this RN will contact [name], RN for [facility] and ask her to reach out for clarification. Patient verbalized understanding and expressed appreciation for the call.

## 2016-06-23 NOTE — Telephone Encounter (Signed)
Pt asking about appts -needs to arrange transportation. Scans are not Josem Kaufmann -I sent message to Benita Stabile for auth. I called pt and told her that her  next appts are expected  week of march 12 . I told her to to go ahead and let her transportation know she will need transportation  that week and for her to expect a call on Monday  from [facility] with scan appt date and time .

## 2016-06-26 ENCOUNTER — Ambulatory Visit: Payer: Self-pay | Admitting: Internal Medicine

## 2016-06-28 ENCOUNTER — Other Ambulatory Visit: Payer: Self-pay

## 2016-06-28 ENCOUNTER — Telehealth: Payer: Self-pay | Admitting: *Deleted

## 2016-06-28 ENCOUNTER — Ambulatory Visit: Payer: Medicaid Other

## 2016-06-28 ENCOUNTER — Other Ambulatory Visit: Payer: Medicaid Other

## 2016-06-28 DIAGNOSIS — C7951 Secondary malignant neoplasm of bone: Secondary | ICD-10-CM

## 2016-06-28 DIAGNOSIS — C3490 Malignant neoplasm of unspecified part of unspecified bronchus or lung: Secondary | ICD-10-CM

## 2016-06-28 MED ORDER — ONDANSETRON HCL 4 MG PO TABS: 4.0000 mg | ORAL_TABLET | Freq: Three times a day (TID) | ORAL | 0 refills | Status: DC | PRN

## 2016-06-28 MED ORDER — OXYCODONE-ACETAMINOPHEN 10-325 MG PO TABS: 1.0000 | ORAL_TABLET | Freq: Four times a day (QID) | ORAL | 0 refills | Status: DC | PRN

## 2016-06-28 MED FILL — ONDANSETRON HCL 4 MG TABLET: 4 | 7 days supply | Qty: 20 | Fill #0

## 2016-06-28 MED FILL — VOLTAREN 1% GEL: 1 | 7 days supply | Qty: 100 | Fill #1

## 2016-06-28 MED FILL — LIDOCAINE-PRILOCAINE CREAM: 2.5-2.5 | 15 days supply | Qty: 30 | Fill #2

## 2016-06-28 MED FILL — OXYCODONE-APAP 10-325: 10-325 | 8 days supply | Qty: 30 | Fill #0

## 2016-06-28 NOTE — Telephone Encounter (Signed)
Confirmed with [facility] has refill on file-will contact patient and let her know.Despina Hidden Cassady3/7/201811:41 AM   Pt aware.Despina Hidden Cassady3/7/201811:43 AM

## 2016-06-28 NOTE — Telephone Encounter (Signed)
Pt called to discuss med list. Pt states Zyrexa gave me nightmares and I would rather not take it. Pt request for percocet, mirtazapine, keppra and nausea medication refill. Discussed with pt mirtazapine id not due for refill, keppra will need to be filled by neurologist. Zofran sent to [facility] is ready for pick up. Pt thanked me for the information no further concerns.

## 2016-06-28 NOTE — Telephone Encounter (Signed)
diclofenac sodium (VOLTAREN) 1 % GEL, refill request.

## 2016-07-03 ENCOUNTER — Ambulatory Visit (HOSPITAL_COMMUNITY)
Admission: RE | Admit: 2016-07-03 | Discharge: 2016-07-03 | Disposition: A | Discharge status: Home / Self Care | Payer: Medicaid Other | Source: Ambulatory Visit | Attending: Internal Medicine | Admitting: Internal Medicine

## 2016-07-03 ENCOUNTER — Telehealth: Payer: Self-pay

## 2016-07-03 DIAGNOSIS — C3491 Malignant neoplasm of unspecified part of right bronchus or lung: Secondary | ICD-10-CM | POA: Insufficient documentation

## 2016-07-03 DIAGNOSIS — I700 Atherosclerosis of aorta: Secondary | ICD-10-CM | POA: Insufficient documentation

## 2016-07-03 DIAGNOSIS — C7972 Secondary malignant neoplasm of left adrenal gland: Secondary | ICD-10-CM | POA: Diagnosis not present

## 2016-07-03 DIAGNOSIS — R5382 Chronic fatigue, unspecified: Secondary | ICD-10-CM

## 2016-07-03 DIAGNOSIS — R590 Localized enlarged lymph nodes: Secondary | ICD-10-CM | POA: Diagnosis not present

## 2016-07-03 DIAGNOSIS — Z5112 Encounter for antineoplastic immunotherapy: Secondary | ICD-10-CM | POA: Insufficient documentation

## 2016-07-03 DIAGNOSIS — C7971 Secondary malignant neoplasm of right adrenal gland: Secondary | ICD-10-CM | POA: Diagnosis not present

## 2016-07-03 DIAGNOSIS — M79651 Pain in right thigh: Secondary | ICD-10-CM | POA: Diagnosis not present

## 2016-07-03 DIAGNOSIS — M79652 Pain in left thigh: Secondary | ICD-10-CM | POA: Insufficient documentation

## 2016-07-03 DIAGNOSIS — C7951 Secondary malignant neoplasm of bone: Secondary | ICD-10-CM | POA: Insufficient documentation

## 2016-07-03 MED ORDER — IOPAMIDOL (ISOVUE-300) INJECTION 61%
30.0000 mL | Freq: Once | INTRAVENOUS | Status: AC | PRN
  Administered 2016-07-03: 30 mL via ORAL

## 2016-07-03 MED ORDER — IOPAMIDOL (ISOVUE-300) INJECTION 61%
INTRAVENOUS | Status: AC
  Administered 2016-07-03: 100 mL
  Filled 2016-07-03: qty 100

## 2016-07-03 MED ORDER — HEPARIN SOD (PORK) LOCK FLUSH 100 UNIT/ML IV SOLN
500.0000 [IU] | Freq: Once | INTRAVENOUS | Status: AC
  Administered 2016-07-03: 500 [IU] via INTRAVENOUS

## 2016-07-03 NOTE — Telephone Encounter (Signed)
Megan at [facility] called stating pt is scheduled at 9 for CT CAP and bilat femur and L hip. She is stating that any time a long bone is ordered the adjacent joints are included so she feels an order for L hip is not needed. She is not allowed to cancel orders. A verbal order was given to cancel the L hip because it will be included in the femur CT.

## 2016-07-05 ENCOUNTER — Encounter: Payer: Self-pay | Admitting: Internal Medicine

## 2016-07-05 ENCOUNTER — Ambulatory Visit (HOSPITAL_BASED_OUTPATIENT_CLINIC_OR_DEPARTMENT_OTHER): Payer: Medicaid Other | Admitting: Internal Medicine

## 2016-07-05 ENCOUNTER — Other Ambulatory Visit (HOSPITAL_BASED_OUTPATIENT_CLINIC_OR_DEPARTMENT_OTHER): Payer: Medicaid Other

## 2016-07-05 ENCOUNTER — Ambulatory Visit: Payer: Medicaid Other

## 2016-07-05 ENCOUNTER — Telehealth: Payer: Self-pay | Admitting: Radiation Oncology

## 2016-07-05 VITALS — HR 101 | Temp 97.8°F | Resp 19 | Ht 66.0 in | Wt 146.0 lb

## 2016-07-05 DIAGNOSIS — F329 Major depressive disorder, single episode, unspecified: Secondary | ICD-10-CM

## 2016-07-05 DIAGNOSIS — C3491 Malignant neoplasm of unspecified part of right bronchus or lung: Secondary | ICD-10-CM | POA: Diagnosis not present

## 2016-07-05 DIAGNOSIS — G893 Neoplasm related pain (acute) (chronic): Secondary | ICD-10-CM

## 2016-07-05 DIAGNOSIS — C7931 Secondary malignant neoplasm of brain: Secondary | ICD-10-CM

## 2016-07-05 DIAGNOSIS — Z95828 Presence of other vascular implants and grafts: Secondary | ICD-10-CM

## 2016-07-05 DIAGNOSIS — C7951 Secondary malignant neoplasm of bone: Secondary | ICD-10-CM | POA: Diagnosis present

## 2016-07-05 DIAGNOSIS — Z7189 Other specified counseling: Secondary | ICD-10-CM

## 2016-07-05 DIAGNOSIS — Z5111 Encounter for antineoplastic chemotherapy: Secondary | ICD-10-CM

## 2016-07-05 DIAGNOSIS — M898X9 Other specified disorders of bone, unspecified site: Secondary | ICD-10-CM

## 2016-07-05 LAB — CBC WITH DIFFERENTIAL/PLATELET
BASO%: 0.7 % (ref 0.0–2.0)
Basophils Absolute: 0 10*3/uL (ref 0.0–0.1)
EOS%: 0.1 % (ref 0.0–7.0)
Eosinophils Absolute: 0 10*3/uL (ref 0.0–0.5)
HCT: 36.7 % (ref 34.8–46.6)
HGB: 12.5 g/dL (ref 11.6–15.9)
LYMPH%: 19.1 % (ref 14.0–49.7)
MCH: 33.4 pg (ref 25.1–34.0)
MCHC: 34 g/dL (ref 31.5–36.0)
MCV: 98.2 fL (ref 79.5–101.0)
MONO#: 1 10*3/uL — ABNORMAL HIGH (ref 0.1–0.9)
MONO%: 16.1 % — ABNORMAL HIGH (ref 0.0–14.0)
NEUT#: 3.9 10*3/uL (ref 1.5–6.5)
NEUT%: 64 % (ref 38.4–76.8)
Platelets: 350 10*3/uL (ref 145–400)
RBC: 3.74 10*6/uL (ref 3.70–5.45)
RDW: 16.8 % — ABNORMAL HIGH (ref 11.2–14.5)
WBC: 6.1 10*3/uL (ref 3.9–10.3)
lymph#: 1.2 10*3/uL (ref 0.9–3.3)

## 2016-07-05 LAB — COMPREHENSIVE METABOLIC PANEL WITH GFR
ALT: <6 U/L
AST: 10 U/L (ref 5–34)
Albumin: 3.2 g/dL — ABNORMAL LOW (ref 3.5–5.0)
Alkaline Phosphatase: 66 U/L (ref 40–150)
Anion Gap: 10 meq/L (ref 3–11)
BUN: <4 mg/dL — ABNORMAL LOW (ref 7.0–26.0)
CO2: 18 meq/L — ABNORMAL LOW (ref 22–29)
Calcium: 9.2 mg/dL (ref 8.4–10.4)
Chloride: 110 meq/L — ABNORMAL HIGH (ref 98–109)
Creatinine: 0.7 mg/dL (ref 0.6–1.1)
EGFR: >90 mL/min/{1.73_m2}
Glucose: 99 mg/dL (ref 70–140)
Potassium: 4.3 meq/L (ref 3.5–5.1)
Sodium: 138 meq/L (ref 136–145)
Total Bilirubin: 0.4 mg/dL (ref 0.20–1.20)
Total Protein: 7.2 g/dL (ref 6.4–8.3)

## 2016-07-05 MED ORDER — FENTANYL 75 MCG/HR TD PT72: 75.0000 ug | MEDICATED_PATCH | TRANSDERMAL | 0 refills | Status: DC

## 2016-07-05 NOTE — Progress Notes (Signed)
[facility] [phone]   [phone]  OFFICE PROGRESS NOTE  [name], MD [place] [address]  DIAGNOSIS: Stage IV (T1a, N2, M1b) non-small cell lung cancer, adenocarcinoma with negative EGFR mutation and presented with a right hilar mass, subcarinal lymphadenopathy as well as a large soft tissue mass in the posterior iliac bone and metastatic bone lesions diagnosed in August 2016.  PRIOR THERAPY: 1) Status post palliative radiotherapy to the soft tissue mass in the left iliac bone under the care of Dr. Valere Dross. 2) Systemic chemotherapy with carboplatin for AUC of 5 and Alimta 500 MG/M2 every 3 weeks. First dose expected on 02/22/2015. Status post 6 cycles. Starting from cycle #3 Alimta was reduced to 400 MG/M2 secondary to neutropenia after the first 2 cycles. 3) Maintenance systemic chemotherapy with single agent Alimta 500 MG/M2 every 3 weeks. First dose 07/19/2015. Status post 3 cycles. Last dose was given 08/30/2015 discontinued secondary to disease progression. 4)  Immunotherapy treatment with Tecentriq (Atezolizumab) 1200 MG IV every 3 weeks, for his dose 10/14/2015. Status post 6 cycles, discontinued secondary to disease progression. 5) whole brain irradiation for multiple new brain metastasis expected to be completed on 02/12/2016. 6) palliative radiotherapy to the progressive bone lesion in the left hip. 7) systemic chemotherapy with docetaxel 75 MG/M2 and Cyramza 10 MG/KG every 3 weeks status post 5 cycles. Discontinued today secondary to disease progression.  CURRENT THERAPY:  1) systemic chemotherapy with gemcitabine 1000 MG/M2 on days 1 and 8 every 3 weeks. First dose 07/12/2016. 2) Xgeva 120 mg subcutaneously on monthly basis for metastatic bone disease  INTERVAL HISTORY: Andrea [name] 46 y.o. female came to the clinic today by EMS on stretcher. The patient continues to have pain on the lower back and right hip. She  tolerated the last cycle of her systemic chemotherapy fairly well. She has a lot of knee changes in the toes and fingers secondary to her treatment with docetaxel. She denied having any chest pain, shortness of breath, cough or hemoptysis. She has no fever or chills. She has no nausea or vomiting. She denied having any weight loss or night sweats. She is currently on fentanyl patch 62.5 g/hour every 3 days in addition to oxycodone for breakthrough pain. Her pain is reasonably tolerated but she could benefit from some increase of her pain medication. She had several studies performed recently including CT scan of the chest, abdomen and pelvis as well as CT scan of the right femur because of painful lesion in that area. She is here today for evaluation and discussion of her scan results and treatment options.   MEDICAL HISTORY: Past Medical History:  Diagnosis Date  . 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 Anginal pain ([place])    chest pain not sure what related to  . Anxiety   . Bone cancer ([place])    lung ca with bone mets  . Chronic back pain   . Depression   . Dysmenorrhea 12/06/2009   Qualifier: Diagnosis of  By: [name] RN, Regino Schultze    . Encounter for antineoplastic immunotherapy 10/07/2015  . Family history of adverse reaction to anesthesia    mother stopped breathing after surgery  . Goals of care, counseling/discussion 05/03/2016  . Headache    stress  . Low back pain radiating to left leg 11/29/2010  . Non-small cell carcinoma of right lung, stage 4 ([place]) 01/01/2015  . S/P radiation therapy 01/01/2015 through 01/21/2015  Left iliac bone 3500 cGy in 14 sessions   . Shortness of breath dyspnea    with exertion, Pt denies 02/2015    ALLERGIES:  is allergic to morphine and related and hydrocodone.  MEDICATIONS:  Current Outpatient Prescriptions  Medication Sig Dispense Refill  . dexamethasone (DECADRON) 4 MG tablet 2  tablet by mouth twice a day the day before, day of and day after the chemotherapy every 3 weeks 60 tablet 1  . diclofenac sodium (VOLTAREN) 1 % GEL Apply 4 g topically 4 (four) times daily. 1 Tube 2  . fentaNYL (DURAGESIC - DOSED MCG/HR) 12 MCG/HR Place 1 patch (12.5 mcg total) onto the skin every 3 (three) days. 10 patch 0  . fentaNYL (DURAGESIC - DOSED MCG/HR) 50 MCG/HR Place 1 patch (50 mcg total) onto the skin every 3 (three) days. 10 patch 0  . lidocaine-prilocaine (EMLA) cream Apply 1 teaspoon to skin 1 to 2 hours before use cover to keep in contact with skin 30 g PRN  . loratadine (CLARITIN) 10 MG tablet Take 1 tablet (10 mg total) by mouth daily.    . mirtazapine (REMERON) 7.5 MG tablet Take 1 tablet (7.5 mg total) by mouth at bedtime. 30 tablet 1  . Multiple Vitamins-Minerals (MULTIVITAMIN WITH MINERALS) tablet Take by mouth.    . OLANZapine (ZYPREXA) 10 MG tablet Take 1 tablet (10 mg total) by mouth at bedtime. 30 tablet 0  . ondansetron (ZOFRAN) 4 MG tablet Take 1 tablet (4 mg total) by mouth every 8 (eight) hours as needed for nausea or vomiting. 20 tablet 0  . oxyCODONE-acetaminophen (PERCOCET) 10-325 MG tablet Take 1 tablet by mouth every 6 (six) hours as needed for pain. 30 tablet 0  . pantoprazole (PROTONIX) 40 MG tablet Take by mouth.    . senna-docusate (SENOKOT-S) 8.6-50 MG tablet Take 2 tablets by mouth at bedtime. 30 tablet 0  . levETIRAcetam (KEPPRA) 1000 MG tablet Take 1 tablet (1,000 mg total) by mouth 2 (two) times daily. (Patient not taking: Reported on 05/03/2016) 60 tablet 0   No current facility-administered medications for this visit.     SURGICAL HISTORY:  Past Surgical History:  Procedure Laterality Date  . laproscopy surgery to check her tubes    . LUMBAR LAMINECTOMY/DECOMPRESSION MICRODISCECTOMY Left 05/01/2014   Procedure: Microdiscectomy - L5-S1 - left;  Surgeon: Eustace Moore, MD;  Location: [facility];  Service: Neurosurgery;  Laterality: Left;    REVIEW OF  SYSTEMS:  Constitutional: positive for fatigue Eyes: negative Ears, nose, mouth, throat, and face: negative Respiratory: negative Cardiovascular: negative Gastrointestinal: negative Genitourinary:negative Integument/breast: negative Hematologic/lymphatic: negative Musculoskeletal:positive for back pain, bone pain and muscle weakness Neurological: negative Behavioral/Psych: negative Endocrine: negative Allergic/Immunologic: negative   PHYSICAL EXAMINATION: General appearance: alert, cooperative, fatigued and no distress Head: Normocephalic, without obvious abnormality, atraumatic Neck: no adenopathy, no JVD, supple, symmetrical, trachea midline and thyroid not enlarged, symmetric, no tenderness/mass/nodules Lymph nodes: Cervical, supraclavicular, and axillary nodes normal. Resp: clear to auscultation bilaterally Back: symmetric, no curvature. ROM normal. No CVA tenderness. Cardio: regular rate and rhythm, S1, S2 normal, no murmur, click, rub or gallop GI: soft, non-tender; bowel sounds normal; no masses,  no organomegaly Extremities: extremities normal, atraumatic, no cyanosis or edema Neurologic: Alert and oriented X 3, normal strength and tone. Normal symmetric reflexes. Normal coordination and gait  ECOG PERFORMANCE STATUS: 2 - Symptomatic, <50% confined to bed  Pulse (!) 101, temperature 97.8 F (36.6 C), temperature source Oral, resp. rate 19, height 5' 6" (  1.676 m), SpO2 98 %.  LABORATORY DATA: Lab Results  Component Value Date   WBC 5.3 06/14/2016   HGB 10.8 (L) 06/14/2016   HCT 32.1 (L) 06/14/2016   MCV 98.4 06/14/2016   PLT 320 06/14/2016      Chemistry      Component Value Date/Time   NA 138 06/14/2016 0818   K 4.2 06/14/2016 0818   CL 108 (H) 05/22/2016 1546   CO2 17 (L) 06/14/2016 0818   BUN 5.5 (L) 06/14/2016 0818   CREATININE 0.8 06/14/2016 0818      Component Value Date/Time   CALCIUM 10.0 06/14/2016 0818   ALKPHOS 51 06/14/2016 0818   AST 10  06/14/2016 0818   ALT 7 06/14/2016 0818   BILITOT 0.29 06/14/2016 0818       RADIOGRAPHIC STUDIES: Ct Chest W Contrast  Result Date: 07/03/2016 CLINICAL DATA:  Patient with history of right lung cancer. Follow-up evaluation. EXAM: CT CHEST, ABDOMEN, AND PELVIS WITH CONTRAST TECHNIQUE: Multidetector CT imaging of the chest, abdomen and pelvis was performed following the standard protocol during bolus administration of intravenous contrast. CONTRAST:  100 cc ISOVUE-300 IOPAMIDOL (ISOVUE-300) INJECTION 61% COMPARISON:  CT chest abdomen pelvis 04/12/2016. FINDINGS: CT CHEST FINDINGS Cardiovascular: Right anterior chest wall Port-A-Cath is present with tip terminating at the superior vena cava. Normal heart size. No pericardial effusion. Aorta and main pulmonary artery normal in caliber. Mediastinum/Nodes: Interval development of an 11 mm right peritracheal lymph node (image 19; series 2). The esophagus is normal in appearance. Lungs/Pleura: Central airways are patent. Atelectasis within the lingula and right middle lobe. Centrilobular emphysematous change. Unchanged 2 mm left upper lobe pulmonary nodule (image 16; series 4). Interval increase in size of irregular right lower lobe nodule measuring 2.5 x 2.1 cm (image 78; series 4), previously 1.9 x 1.3 cm. Unchanged 2 mm nodule within the right middle lobe (image 85; series 4). No pleural effusion or pneumothorax. Musculoskeletal: No aggressive or acute appearing osseous lesions. CT ABDOMEN PELVIS FINDINGS Hepatobiliary: Liver is normal in size and contour. No focal lesion identified. Gallbladder is unremarkable. No intrahepatic or extrahepatic biliary ductal dilatation. Pancreas: Unremarkable Spleen: Unremarkable Adrenals/Urinary Tract: Interval increase in size of nodules involving the bilateral adrenal glands. The left adrenal nodule measures 4.0 x 1.7 cm (image 54; series 2), previously 3.4 x 1.7 cm. The right adrenal nodule measures 3.7 x 2.2 cm (image 50;  series 2), previously 2.3 x 1.6 cm. Kidneys enhance symmetrically with contrast. Unchanged low-attenuation renal lesions bilaterally. No hydronephrosis. Urinary bladder is unremarkable. Stomach/Bowel: No abnormal bowel wall thickening or evidence for bowel obstruction. Normal appendix. No free fluid or free intraperitoneal air. Vascular/Lymphatic: Normal caliber abdominal aorta. No retroperitoneal lymphadenopathy. Peripheral calcified atherosclerotic plaque. Reproductive: Uterus and adnexal structures unremarkable. Other: None. Musculoskeletal: Re- demonstrated mixed lytic and sclerotic lesion within the posterior left ilium measuring 3.1 x 1.9 cm, grossly unchanged from prior (image 89; series 2). Re- demonstrated relatively ill defined lytic lesion within the left lateral aspect of the sacrum adjacent to the SI joint. Lumbar spine degenerative changes. IMPRESSION: Interval increase in size of right lower lobe nodule with new right peritracheal adenopathy. Interval increase in size of bilateral adrenal metastasis. Similar appearing osseous metastasis involving the bony pelvis. Aortic atherosclerosis. Electronically Signed   By: Lovey Newcomer M.D.   On: 07/03/2016 15:23   Mr Jeri Cos UO Contrast  Result Date: 06/16/2016 CLINICAL DATA:  46 y/o F; stage IV lung cancer with brain and bone metastasis. [facility]  protocol. EXAM: MRI HEAD WITHOUT AND WITH CONTRAST TECHNIQUE: Multiplanar, multiecho pulse sequences of the brain and surrounding structures were obtained without and with intravenous contrast. CONTRAST:  49m MULTIHANCE GADOBENATE DIMEGLUMINE 529 MG/ML IV SOLN COMPARISON:  13 cc MultiHance FINDINGS: Brain: Brain metastasis are decreased in size, no longer appreciable, only visible on SWI compatible with involution and treatment response when compared with the prior MRI of the brain. No new intracranial metastasis is identified. Marked interval diminution in vasogenic edema associated with metastasis with minimal  residual around the largest left frontal lesion. 1. Left frontal, 12 mm, series 12, image 120. 2. Right lateral frontal, 8 mm, series 12, image 96. 3. Left occipital periventricular, 5 mm, series 12, image 91. 4. Right occipital, susceptibility hypointensity only, no enhancement, series 7, image 17. No evidence for acute/early subacute infarct, hydrocephalus, extra-axial collection, or significant mass effect. Vascular: Normal flow voids. Skull and upper cervical spine: Normal marrow signal. Sinuses/Orbits: Moderate diffuse paranasal sinus mucosal thickening and trace right-sided mastoid effusion. Orbits are unremarkable. Other: New IMPRESSION: 1. Brain metastasis are decreased in size, no longer appreciable, for only visible on SWI compatible with involution and treatment response when compared with the prior MRI of the brain. 2. No new metastasis identified. 3. Moderate paranasal sinus disease. Electronically Signed   By: LKristine GarbeM.D.   On: 06/16/2016 20:48   Ct Abdomen Pelvis W Contrast  Result Date: 07/03/2016 CLINICAL DATA:  Patient with history of right lung cancer. Follow-up evaluation. EXAM: CT CHEST, ABDOMEN, AND PELVIS WITH CONTRAST TECHNIQUE: Multidetector CT imaging of the chest, abdomen and pelvis was performed following the standard protocol during bolus administration of intravenous contrast. CONTRAST:  100 cc ISOVUE-300 IOPAMIDOL (ISOVUE-300) INJECTION 61% COMPARISON:  CT chest abdomen pelvis 04/12/2016. FINDINGS: CT CHEST FINDINGS Cardiovascular: Right anterior chest wall Port-A-Cath is present with tip terminating at the superior vena cava. Normal heart size. No pericardial effusion. Aorta and main pulmonary artery normal in caliber. Mediastinum/Nodes: Interval development of an 11 mm right peritracheal lymph node (image 19; series 2). The esophagus is normal in appearance. Lungs/Pleura: Central airways are patent. Atelectasis within the lingula and right middle lobe.  Centrilobular emphysematous change. Unchanged 2 mm left upper lobe pulmonary nodule (image 16; series 4). Interval increase in size of irregular right lower lobe nodule measuring 2.5 x 2.1 cm (image 78; series 4), previously 1.9 x 1.3 cm. Unchanged 2 mm nodule within the right middle lobe (image 85; series 4). No pleural effusion or pneumothorax. Musculoskeletal: No aggressive or acute appearing osseous lesions. CT ABDOMEN PELVIS FINDINGS Hepatobiliary: Liver is normal in size and contour. No focal lesion identified. Gallbladder is unremarkable. No intrahepatic or extrahepatic biliary ductal dilatation. Pancreas: Unremarkable Spleen: Unremarkable Adrenals/Urinary Tract: Interval increase in size of nodules involving the bilateral adrenal glands. The left adrenal nodule measures 4.0 x 1.7 cm (image 54; series 2), previously 3.4 x 1.7 cm. The right adrenal nodule measures 3.7 x 2.2 cm (image 50; series 2), previously 2.3 x 1.6 cm. Kidneys enhance symmetrically with contrast. Unchanged low-attenuation renal lesions bilaterally. No hydronephrosis. Urinary bladder is unremarkable. Stomach/Bowel: No abnormal bowel wall thickening or evidence for bowel obstruction. Normal appendix. No free fluid or free intraperitoneal air. Vascular/Lymphatic: Normal caliber abdominal aorta. No retroperitoneal lymphadenopathy. Peripheral calcified atherosclerotic plaque. Reproductive: Uterus and adnexal structures unremarkable. Other: None. Musculoskeletal: Re- demonstrated mixed lytic and sclerotic lesion within the posterior left ilium measuring 3.1 x 1.9 cm, grossly unchanged from prior (image 89; series 2). Re- demonstrated  relatively ill defined lytic lesion within the left lateral aspect of the sacrum adjacent to the SI joint. Lumbar spine degenerative changes. IMPRESSION: Interval increase in size of right lower lobe nodule with new right peritracheal adenopathy. Interval increase in size of bilateral adrenal metastasis. Similar  appearing osseous metastasis involving the bony pelvis. Aortic atherosclerosis. Electronically Signed   By: Lovey Newcomer M.D.   On: 07/03/2016 15:23   Ct Femur Left W Contrast  Result Date: 07/03/2016 CLINICAL DATA:  History of lung carcinoma with bone metastases. The patient is currently undergoing chemotherapy. Bilateral femur pain. EXAM: CT OF THE RIGHT AND LEFT UPPER LEGS EXTREMITY WITH CONTRAST TECHNIQUE: Multidetector CT imaging of the lower right extremity was performed according to the standard protocol following intravenous contrast administration. COMPARISON:  CT chest, abdomen and pelvis this same day. MRI of the pelvis 03/15/2016. Plain films left hip 12/31/2014. CONTRAST:  100 cc Omnipaque 350. FINDINGS: Bones/Joint/Cartilage No lytic or sclerotic lesion is identified. No bony destructive change is seen. Mild periosteal reaction is seen along the distal cortex of both the right and left femur and appears fairly symmetric. No fracture or dislocation is identified. Ligaments Suboptimally assessed by CT. Muscles and Tendons Intact and normal in appearance. No soft tissue mass or fluid collection is identified Soft tissues Appear normal bilaterally. No fluid collection or mass is identified. IMPRESSION: Negative for metastatic disease. No focal lesion or finding to explain the patient's pain. Mild, symmetric periosteal reaction along the medial cortex of both distal femurs may be due to remote stress change. Electronically Signed   By: Inge Rise M.D.   On: 07/03/2016 15:38   Ct Femur Right W Contrast  Result Date: 07/03/2016 CLINICAL DATA:  History of lung carcinoma with bone metastases. The patient is currently undergoing chemotherapy. Bilateral femur pain. EXAM: CT OF THE RIGHT AND LEFT UPPER LEGS EXTREMITY WITH CONTRAST TECHNIQUE: Multidetector CT imaging of the lower right extremity was performed according to the standard protocol following intravenous contrast administration. COMPARISON:   CT chest, abdomen and pelvis this same day. MRI of the pelvis 03/15/2016. Plain films left hip 12/31/2014. CONTRAST:  100 cc Omnipaque 350. FINDINGS: Bones/Joint/Cartilage No lytic or sclerotic lesion is identified. No bony destructive change is seen. Mild periosteal reaction is seen along the distal cortex of both the right and left femur and appears fairly symmetric. No fracture or dislocation is identified. Ligaments Suboptimally assessed by CT. Muscles and Tendons Intact and normal in appearance. No soft tissue mass or fluid collection is identified Soft tissues Appear normal bilaterally. No fluid collection or mass is identified. IMPRESSION: Negative for metastatic disease. No focal lesion or finding to explain the patient's pain. Mild, symmetric periosteal reaction along the medial cortex of both distal femurs may be due to remote stress change. Electronically Signed   By: Inge Rise M.D.   On: 07/03/2016 15:38   Mm Diag Breast Tomo Uni Left  Result Date: 06/19/2016 CLINICAL DATA:  46 year old patient presents for follow-up of probably benign calcifications in the left breast. She tells me today that she is undergoing treatment for stage IV lung cancer. EXAM: 2D DIGITAL DIAGNOSTIC UNILATERAL LEFT MAMMOGRAM WITH CAD AND ADJUNCT TOMO COMPARISON:  October 28, 2015 ACR Breast Density Category c: The breast tissue is heterogeneously dense, which may obscure small masses. FINDINGS: No mass or architectural distortion is identified in the left breast. Magnification views of the upper outer left breast posteriorly show a tight group of coarse dystrophic calcifications spanning 3 mm.  These are unchanged. Mammographic images were processed with CAD. IMPRESSION: Stable appearance of probably benign calcifications in the upper outer left breast. RECOMMENDATION: Bilateral diagnostic mammogram is suggested in July 2018. I have discussed the findings and recommendations with the patient. Results were also provided in  writing at the conclusion of the visit. If applicable, a reminder letter will be sent to the patient regarding the next appointment. BI-RADS CATEGORY  3: Probably benign. Electronically Signed   By: Curlene Dolphin M.D.   On: 06/19/2016 14:41    ASSESSMENT AND PLAN:  This is a very pleasant 46 years old African-American female with metastatic non-small cell lung cancer, adenocarcinoma with bone and brain metastasis. The patient is status post palliative radiotherapy to the metastatic bone lesions as well as whole brain irradiation. She was also treated with several chemotherapy regimens including systemic chemotherapy with carboplatin and Alimta followed by maintenance Alimta discontinued secondary to disease progression. She was started on second line treatment with immunotherapy with Tecentriq (Atezolizumab) discontinued secondary to disease progression. Recently the patient was treated with systemic chemotherapy with docetaxel and Cyramza status post 5 cycles. This treatment was complicated with increasing fatigue and weakness as well as nail changes. The patient had repeat CT scan of the chest, abdomen and pelvis as well as CT scan of the right femur. I personally and independently reviewed the scan images and discussed the result with the patient today. Unfortunately the scan showed evidence for disease progression and the chest as well as adrenal glands and bones. I had a lengthy discussion with the patient today about her treatment options and goals of care. I strongly recommended for the patient to consider palliative care and hospice but she declined this option and she would like to consider one more treatment regimen. I discussed with her treatment with single agent gemcitabine 1000 MG/M2 on days 1 and 8 every 3 weeks. I discussed with the patient adverse effect of this treatment including but not limited to alopecia, myelosuppression, nausea and vomiting, peripheral neuropathy, liver or renal  dysfunction. She is expected to start the first dose of this treatment next week. I will see her back for follow-up visit in 4 weeks with the start of cycle #2. For pain management, I increased her fentanyl patch to 75 g/hour every 3 days and I gave her prescription for this medication today. She will also continue on the oxycodone for breakthrough pain. She will also continue her treatment with Velcade in For nausea and she will continue on Zyprexa. The patient and lack of appetite the patient will continue treatment with Remeron. For constipation the patient will continue her current treatment with Senokot-S. The patient was advised to call immediately if she has any concerning symptoms in the interval. The patient voices understanding of current disease status and treatment options and is in agreement with the current care plan.  All questions were answered. The patient knows to call the clinic with any problems, questions or concerns. We can certainly see the patient much sooner if necessary.  Disclaimer: This note was dictated with voice recognition software. Similar sounding words can inadvertently be transcribed and may not be corrected upon review.

## 2016-07-05 NOTE — Progress Notes (Signed)
DISCONTINUE ON PATHWAY REGIMEN - Non-Small Cell Lung     A cycle is every 21 days:     Ramucirumab        Dose Mod: None     Docetaxel        Dose Mod: None  **Always confirm dose/schedule in your pharmacy ordering system**    REASON: Disease Progression PRIOR TREATMENT: [id number]: Docetaxel 75 mg/m2 + Ramucirumab 10 mg/kg q21 Days Until Progression or Unacceptable Toxicity TREATMENT RESPONSE: Progressive Disease (PD)  Non-Small Cell Lung - No Medical Intervention - Off Treatment.  Patient Characteristics: Stage IV Metastatic, Non Squamous, Fourth Line and Beyond - Chemotherapy/Immunotherapy, PS >= 2 AJCC T Category: T1b Current Disease Status: Distant Metastases AJCC N Category: N2 AJCC M Category: M1c AJCC 8 Stage Grouping: IVB Histology: Non Squamous Cell ROS1 Rearrangement Status: Did Not Order Test [id number] Mutation Status: Not Applicable - EGFR Mutation Negative/Unknown Other Mutations/Biomarkers: No Other Actionable Mutations PD-L1 Expression Status: Quantity Not Sufficient Chemotherapy/Immunotherapy LOT: Fourth Line and Beyond Chemotherapy/Immunotherapy Molecular Targeted Therapy: Not Appropriate ALK Translocation Status: Negative Would you be surprised if this patient died  in the next year? I would NOT be surprised if this patient died in the next year EGFR Mutation Status: Negative/Wild Type BRAF [id number] Mutation Status: Negative Performance Status: PS = 2

## 2016-07-05 NOTE — Telephone Encounter (Signed)
Patient left message requesting return call. Phoned patient back. Patient requested refill of her seizure medication. This RN understands from [facility], [place] in medical oncology that Dr. Julien Nordmann instructed the patient to contact neurologist at [facility] for refill. This RN stressed the same information to the patient by encouraging her to call [place]. Patient verbalized understanding and expressed appreciation for the return call.

## 2016-07-06 ENCOUNTER — Telehealth: Payer: Self-pay | Admitting: Medical Oncology

## 2016-07-06 ENCOUNTER — Telehealth: Payer: Self-pay | Admitting: Internal Medicine

## 2016-07-06 DIAGNOSIS — C7931 Secondary malignant neoplasm of brain: Secondary | ICD-10-CM

## 2016-07-06 DIAGNOSIS — C3491 Malignant neoplasm of unspecified part of right bronchus or lung: Secondary | ICD-10-CM

## 2016-07-06 NOTE — Telephone Encounter (Signed)
Pt left message asking if biomarkers done on her cancer to see if she is elegible for immunotherapy.Per Julien Nordmann her tumor tested negative for molecular testing.

## 2016-07-06 NOTE — Telephone Encounter (Signed)
Pt called [name] triage asking for refill on Keppra and referral to neurologist. Nurse reported that  " Pt stated she has been off [place] for 5 weeks".  Per [facility] referral sent to [facility] for seizure medication management.  I reviewed medications with pt and discontinued several based on she has not taken them in the past 30 days.

## 2016-07-06 NOTE — Telephone Encounter (Signed)
[facility] to follow up referral. They said it was in the work que and someone will follow up and call the patient soon.

## 2016-07-07 MED FILL — fentaNYL 75 MCG/HR PT72: 75 | 30 days supply | Qty: 10 | Fill #0

## 2016-07-07 NOTE — Telephone Encounter (Signed)
I called pt to give her message below but there was no answer.

## 2016-07-07 NOTE — Telephone Encounter (Signed)
I told pt her molecular testing was negative.and she requests copies. I confirmed appt for next week. Copies printed for next appt.

## 2016-07-12 ENCOUNTER — Other Ambulatory Visit (HOSPITAL_BASED_OUTPATIENT_CLINIC_OR_DEPARTMENT_OTHER): Payer: Medicaid Other

## 2016-07-12 ENCOUNTER — Other Ambulatory Visit: Payer: Self-pay | Admitting: Medical Oncology

## 2016-07-12 ENCOUNTER — Ambulatory Visit (HOSPITAL_BASED_OUTPATIENT_CLINIC_OR_DEPARTMENT_OTHER): Payer: Medicaid Other

## 2016-07-12 ENCOUNTER — Ambulatory Visit: Payer: Self-pay | Admitting: Internal Medicine

## 2016-07-12 VITALS — BP 119/78 | HR 76 | Temp 97.5°F | Resp 18

## 2016-07-12 DIAGNOSIS — Z95828 Presence of other vascular implants and grafts: Secondary | ICD-10-CM

## 2016-07-12 DIAGNOSIS — C3491 Malignant neoplasm of unspecified part of right bronchus or lung: Secondary | ICD-10-CM

## 2016-07-12 DIAGNOSIS — C7951 Secondary malignant neoplasm of bone: Secondary | ICD-10-CM | POA: Diagnosis not present

## 2016-07-12 DIAGNOSIS — C7931 Secondary malignant neoplasm of brain: Secondary | ICD-10-CM | POA: Diagnosis not present

## 2016-07-12 DIAGNOSIS — Z5111 Encounter for antineoplastic chemotherapy: Secondary | ICD-10-CM

## 2016-07-12 DIAGNOSIS — C3490 Malignant neoplasm of unspecified part of unspecified bronchus or lung: Secondary | ICD-10-CM

## 2016-07-12 DIAGNOSIS — M898X9 Other specified disorders of bone, unspecified site: Secondary | ICD-10-CM

## 2016-07-12 LAB — COMPREHENSIVE METABOLIC PANEL
ALT: 8 U/L (ref 0–55)
AST: 14 U/L (ref 5–34)
Albumin: 3.2 g/dL — ABNORMAL LOW (ref 3.5–5.0)
Alkaline Phosphatase: 62 U/L (ref 40–150)
Anion Gap: 9 mEq/L (ref 3–11)
BUN: 4 mg/dL — AB (ref 7.0–26.0)
CALCIUM: 10.8 mg/dL — AB (ref 8.4–10.4)
CHLORIDE: 109 meq/L (ref 98–109)
CO2: 20 meq/L — AB (ref 22–29)
CREATININE: 0.8 mg/dL (ref 0.6–1.1)
EGFR: >90 mL/min/{1.73_m2} (ref >90)
GLUCOSE: 137 mg/dL (ref 70–140)
Potassium: 3.9 mEq/L (ref 3.5–5.1)
SODIUM: 137 meq/L (ref 136–145)
Total Bilirubin: 0.23 mg/dL (ref 0.20–1.20)
Total Protein: 6.7 g/dL (ref 6.4–8.3)

## 2016-07-12 LAB — CBC WITH DIFFERENTIAL/PLATELET
BASO%: 0.4 % (ref 0.0–2.0)
Basophils Absolute: 0 10*3/uL (ref 0.0–0.1)
EOS%: 0 % (ref 0.0–7.0)
Eosinophils Absolute: 0 10*3/uL (ref 0.0–0.5)
HCT: 30.9 % — ABNORMAL LOW (ref 34.8–46.6)
HGB: 10.5 g/dL — ABNORMAL LOW (ref 11.6–15.9)
LYMPH%: 21.4 % (ref 14.0–49.7)
MCH: 33.3 pg (ref 25.1–34.0)
MCHC: 33.9 g/dL (ref 31.5–36.0)
MCV: 98.1 fL (ref 79.5–101.0)
MONO#: 0.7 10*3/uL (ref 0.1–0.9)
MONO%: 15.9 % — AB (ref 0.0–14.0)
NEUT#: 2.8 10*3/uL (ref 1.5–6.5)
NEUT%: 62.3 % (ref 38.4–76.8)
Platelets: 399 10*3/uL (ref 145–400)
RBC: 3.15 10*6/uL — AB (ref 3.70–5.45)
RDW: 16.8 % — ABNORMAL HIGH (ref 11.2–14.5)
WBC: 4.5 10*3/uL (ref 3.9–10.3)
lymph#: 1 10*3/uL (ref 0.9–3.3)

## 2016-07-12 MED ORDER — HEPARIN SOD (PORK) LOCK FLUSH 100 UNIT/ML IV SOLN
500.0000 [IU] | Freq: Once | INTRAVENOUS | Status: AC | PRN
  Administered 2016-07-12: 500 [IU]
  Filled 2016-07-12: qty 5

## 2016-07-12 MED ORDER — SODIUM CHLORIDE 0.9 % IV SOLN
1000.0000 mg/m2 | Freq: Once | INTRAVENOUS | Status: AC
  Administered 2016-07-12: 1748 mg via INTRAVENOUS
  Filled 2016-07-12: qty 45.97

## 2016-07-12 MED ORDER — SODIUM CHLORIDE 0.9% FLUSH
10.0000 mL | INTRAVENOUS | Status: DC | PRN
  Administered 2016-07-12: 10 mL
  Filled 2016-07-12: qty 10

## 2016-07-12 MED ORDER — DENOSUMAB 120 MG/1.7ML ~~LOC~~ SOLN
120.0000 mg | Freq: Once | SUBCUTANEOUS | Status: AC
  Administered 2016-07-12: 120 mg via SUBCUTANEOUS
  Filled 2016-07-12: qty 1.7

## 2016-07-12 MED ORDER — PROCHLORPERAZINE MALEATE 10 MG PO TABS
10.0000 mg | ORAL_TABLET | Freq: Once | ORAL | Status: AC
  Administered 2016-07-12: 10 mg via ORAL

## 2016-07-12 MED ORDER — OXYCODONE-ACETAMINOPHEN 10-325 MG PO TABS: 1.0000 | ORAL_TABLET | Freq: Four times a day (QID) | ORAL | 0 refills | Status: DC | PRN

## 2016-07-12 MED ORDER — SODIUM CHLORIDE 0.9 % IV SOLN
Freq: Once | INTRAVENOUS | Status: AC
  Administered 2016-07-12: 13:00:00 via INTRAVENOUS

## 2016-07-12 MED ORDER — PROCHLORPERAZINE MALEATE 10 MG PO TABS
ORAL_TABLET | ORAL | Status: AC
  Filled 2016-07-12: qty 1

## 2016-07-12 MED FILL — OXYCODONE-APAP 10-325 MG TA: 10-325 | 15 days supply | Qty: 60 | Fill #0

## 2016-07-12 NOTE — Patient Instructions (Signed)
[place] Discharge Instructions for Patients Receiving Chemotherapy  Today you received the following chemotherapy agents: Gemcitabine   To help prevent nausea and vomiting after your treatment, we encourage you to take your nausea medication as prescribed.   If you develop nausea and vomiting that is not controlled by your nausea medication, call the clinic.   BELOW ARE SYMPTOMS THAT SHOULD BE REPORTED IMMEDIATELY:  *FEVER GREATER THAN 100.5 F  *CHILLS WITH OR WITHOUT FEVER  NAUSEA AND VOMITING THAT IS NOT CONTROLLED WITH YOUR NAUSEA MEDICATION  *UNUSUAL SHORTNESS OF BREATH  *UNUSUAL BRUISING OR BLEEDING  TENDERNESS IN MOUTH AND THROAT WITH OR WITHOUT PRESENCE OF ULCERS  *URINARY PROBLEMS  *BOWEL PROBLEMS  UNUSUAL RASH Items with * indicate a potential emergency and should be followed up as soon as possible.  Feel free to call the clinic you have any questions or concerns. The clinic phone number is [phone].  Please show the [place] at check-in to the Emergency Department and triage nurse.  Gemcitabine injection What is this medicine? GEMCITABINE (jem SIT a been) is a chemotherapy drug. This medicine is used to treat many types of cancer like breast cancer, lung cancer, pancreatic cancer, and ovarian cancer. This medicine may be used for other purposes; ask your health care provider or pharmacist if you have questions. COMMON BRAND NAME(S): Gemzar What should I tell my health care provider before I take this medicine? They need to know if you have any of these conditions: -blood disorders -infection -kidney disease -liver disease -recent or ongoing radiation therapy -an unusual or allergic reaction to gemcitabine, other chemotherapy, other medicines, foods, dyes, or preservatives -pregnant or trying to get pregnant -breast-feeding How should I use this medicine? This drug is given as an infusion into a vein. It is administered in a  hospital or clinic by a specially trained health care professional. Talk to your pediatrician regarding the use of this medicine in children. Special care may be needed. Overdosage: If you think you have taken too much of this medicine contact a poison control center or emergency room at once. NOTE: This medicine is only for you. Do not share this medicine with others. What if I miss a dose? It is important not to miss your dose. Call your doctor or health care professional if you are unable to keep an appointment. What may interact with this medicine? -medicines to increase blood counts like filgrastim, pegfilgrastim, sargramostim -some other chemotherapy drugs like cisplatin -vaccines Talk to your doctor or health care professional before taking any of these medicines: -acetaminophen -aspirin -ibuprofen -ketoprofen -naproxen This list may not describe all possible interactions. Give your health care provider a list of all the medicines, herbs, non-prescription drugs, or dietary supplements you use. Also tell them if you smoke, drink alcohol, or use illegal drugs. Some items may interact with your medicine. What should I watch for while using this medicine? Visit your doctor for checks on your progress. This drug may make you feel generally unwell. This is not uncommon, as chemotherapy can affect healthy cells as well as cancer cells. Report any side effects. Continue your course of treatment even though you feel ill unless your doctor tells you to stop. In some cases, you may be given additional medicines to help with side effects. Follow all directions for their use. Call your doctor or health care professional for advice if you get a fever, chills or sore throat, or other symptoms of a cold or  flu. Do not treat yourself. This drug decreases your body's ability to fight infections. Try to avoid being around people who are sick. This medicine may increase your risk to bruise or bleed. Call  your doctor or health care professional if you notice any unusual bleeding. Be careful brushing and flossing your teeth or using a toothpick because you may get an infection or bleed more easily. If you have any dental work done, tell your dentist you are receiving this medicine. Avoid taking products that contain aspirin, acetaminophen, ibuprofen, naproxen, or ketoprofen unless instructed by your doctor. These medicines may hide a fever. Women should inform their doctor if they wish to become pregnant or think they might be pregnant. There is a potential for serious side effects to an unborn child. Talk to your health care professional or pharmacist for more information. Do not breast-feed an infant while taking this medicine. What side effects may I notice from receiving this medicine? Side effects that you should report to your doctor or health care professional as soon as possible: -allergic reactions like skin rash, itching or hives, swelling of the face, lips, or tongue -low blood counts - this medicine may decrease the number of white blood cells, red blood cells and platelets. You may be at increased risk for infections and bleeding. -signs of infection - fever or chills, cough, sore throat, pain or difficulty passing urine -signs of decreased platelets or bleeding - bruising, pinpoint red spots on the skin, black, tarry stools, blood in the urine -signs of decreased red blood cells - unusually weak or tired, fainting spells, lightheadedness -breathing problems -chest pain -mouth sores -nausea and vomiting -pain, swelling, redness at site where injected -pain, tingling, numbness in the hands or feet -stomach pain -swelling of ankles, feet, hands -unusual bleeding Side effects that usually do not require medical attention (report to your doctor or health care professional if they continue or are bothersome): -constipation -diarrhea -hair loss -loss of appetite -stomach upset This  list may not describe all possible side effects. Call your doctor for medical advice about side effects. You may report side effects to FDA at 1-800-FDA-1088. Where should I keep my medicine? This drug is given in a hospital or clinic and will not be stored at home. NOTE: This sheet is a summary. It may not cover all possible information. If you have questions about this medicine, talk to your doctor, pharmacist, or health care provider.  2018 Elsevier/Gold Standard (2007-08-20 18:45:54)

## 2016-07-13 ENCOUNTER — Telehealth: Payer: Self-pay | Admitting: Medical Oncology

## 2016-07-13 NOTE — Telephone Encounter (Signed)
Chemo followup - called pt cell , no answer.

## 2016-07-13 NOTE — Progress Notes (Signed)
Counseling intern called to check-in with patient and left voicemail message on 07/12/2016 . Counselor called again on 07/13/2016 but patient was still unavailable.  Andrea [name] A. Andrea [name], Counseling Intern Department for [facility] and [employer] Supervisor - Chaplain Jerene Pitch

## 2016-07-14 ENCOUNTER — Telehealth: Payer: Self-pay | Admitting: Internal Medicine

## 2016-07-14 NOTE — Telephone Encounter (Signed)
Appointments adjusted per 3/13 los. Next appointments for 3/28 remains the same. Left message for patient re updated appointments - patient to get new schedule 3/28.

## 2016-07-19 ENCOUNTER — Encounter: Payer: Self-pay | Admitting: *Deleted

## 2016-07-19 ENCOUNTER — Other Ambulatory Visit (HOSPITAL_BASED_OUTPATIENT_CLINIC_OR_DEPARTMENT_OTHER): Payer: Medicaid Other

## 2016-07-19 ENCOUNTER — Ambulatory Visit (HOSPITAL_BASED_OUTPATIENT_CLINIC_OR_DEPARTMENT_OTHER): Payer: Medicaid Other

## 2016-07-19 VITALS — BP 99/69 | HR 94 | Temp 97.8°F | Resp 18

## 2016-07-19 DIAGNOSIS — C7951 Secondary malignant neoplasm of bone: Secondary | ICD-10-CM

## 2016-07-19 DIAGNOSIS — C3491 Malignant neoplasm of unspecified part of right bronchus or lung: Secondary | ICD-10-CM

## 2016-07-19 DIAGNOSIS — C7931 Secondary malignant neoplasm of brain: Secondary | ICD-10-CM

## 2016-07-19 DIAGNOSIS — Z5111 Encounter for antineoplastic chemotherapy: Secondary | ICD-10-CM

## 2016-07-19 LAB — COMPREHENSIVE METABOLIC PANEL
ALBUMIN: 3.1 g/dL — AB (ref 3.5–5.0)
ALK PHOS: 60 U/L (ref 40–150)
ALT: 11 U/L (ref 0–55)
AST: 13 U/L (ref 5–34)
Anion Gap: 7 mEq/L (ref 3–11)
BUN: 6.8 mg/dL — ABNORMAL LOW (ref 7.0–26.0)
CALCIUM: 9.1 mg/dL (ref 8.4–10.4)
CO2: 23 mEq/L (ref 22–29)
CREATININE: 0.8 mg/dL (ref 0.6–1.1)
Chloride: 107 mEq/L (ref 98–109)
EGFR: >90 mL/min/{1.73_m2} (ref >90)
GLUCOSE: 98 mg/dL (ref 70–140)
Potassium: 3.8 mEq/L (ref 3.5–5.1)
SODIUM: 136 meq/L (ref 136–145)
TOTAL PROTEIN: 6.2 g/dL — AB (ref 6.4–8.3)
Total Bilirubin: 0.38 mg/dL (ref 0.20–1.20)

## 2016-07-19 LAB — CBC WITH DIFFERENTIAL/PLATELET
BASO%: 1.6 % (ref 0.0–2.0)
BASOS ABS: 0 10*3/uL (ref 0.0–0.1)
EOS%: 1.6 % (ref 0.0–7.0)
Eosinophils Absolute: 0 10*3/uL (ref 0.0–0.5)
HEMATOCRIT: 29.3 % — AB (ref 34.8–46.6)
HEMOGLOBIN: 10 g/dL — AB (ref 11.6–15.9)
LYMPH#: 0.6 10*3/uL — AB (ref 0.9–3.3)
LYMPH%: 32.6 % (ref 14.0–49.7)
MCH: 33.4 pg (ref 25.1–34.0)
MCHC: 34 g/dL (ref 31.5–36.0)
MCV: 98.2 fL (ref 79.5–101.0)
MONO#: 0.2 10*3/uL (ref 0.1–0.9)
MONO%: 13.6 % (ref 0.0–14.0)
NEUT%: 50.6 % (ref 38.4–76.8)
NEUTROS ABS: 0.9 10*3/uL — AB (ref 1.5–6.5)
Platelets: 235 10*3/uL (ref 145–400)
RBC: 2.98 10*6/uL — ABNORMAL LOW (ref 3.70–5.45)
RDW: 16.2 % — AB (ref 11.2–14.5)
WBC: 1.7 10*3/uL — AB (ref 3.9–10.3)

## 2016-07-19 MED ORDER — PROCHLORPERAZINE MALEATE 10 MG PO TABS
10.0000 mg | ORAL_TABLET | Freq: Once | ORAL | Status: AC
  Administered 2016-07-19: 10 mg via ORAL

## 2016-07-19 MED ORDER — PROCHLORPERAZINE MALEATE 10 MG PO TABS
ORAL_TABLET | ORAL | Status: AC
Start: 2016-07-19 — End: 2016-07-19
  Filled 2016-07-19: qty 1

## 2016-07-19 MED ORDER — HEPARIN SOD (PORK) LOCK FLUSH 100 UNIT/ML IV SOLN
500.0000 [IU] | Freq: Once | INTRAVENOUS | Status: AC | PRN
  Administered 2016-07-19: 500 [IU]
  Filled 2016-07-19: qty 5

## 2016-07-19 MED ORDER — SODIUM CHLORIDE 0.9 % IV SOLN
1000.0000 mg/m2 | Freq: Once | INTRAVENOUS | Status: AC
  Administered 2016-07-19: 1748 mg via INTRAVENOUS
  Filled 2016-07-19: qty 45.97

## 2016-07-19 MED ORDER — SODIUM CHLORIDE 0.9 % IV SOLN
Freq: Once | INTRAVENOUS | Status: AC
  Administered 2016-07-19: 13:00:00 via INTRAVENOUS

## 2016-07-19 MED ORDER — SODIUM CHLORIDE 0.9% FLUSH
10.0000 mL | INTRAVENOUS | Status: DC | PRN
  Administered 2016-07-19: 10 mL
  Filled 2016-07-19: qty 10

## 2016-07-19 NOTE — Progress Notes (Signed)
Per Dr. Julien Nordmann ok to treat with ANC of 0.9.

## 2016-07-19 NOTE — Progress Notes (Signed)
[place] Work  Clinical Social Work was referred by patient for assistance with Cancer Care application.  Clinical Social Worker met with patient briefly to offer support and assess for needs. Pt needed Cancer Care application submitted. CSW faxed form as requested. Pt appeared in good spirits today. No other needs noted.    Clinical Social Work interventions:  Resource assistance  Loren Racer, [employer], OSW-C Clinical Social Worker [employer]  [place] Phone: [phone] Fax: [phone]

## 2016-07-19 NOTE — Patient Instructions (Addendum)
Managing Low Blood Counts During Cancer Treatment Cancer treatments such as chemotherapy and radiation can sometimes cause a drop in the supply of blood cells in the body, including red blood cells, white blood cells, and platelets. These blood cells are produced in the body and are released into the blood to perform specific functions:  Red blood cells carry gases such as oxygen and carbon dioxide to and from your lungs.  White blood cells help protect you from infection.  Platelets help your body to form blood clots to prevent and control bleeding. When cancer treatments cause a drop in blood cell counts, your body may not have enough cells to keep up its normal functions. Symptoms or problems that may result will vary depending on which type of blood cells the treatment is affecting. If your blood counts are low, you can take steps to help manage any problems. How can low blood counts affect me? Low blood counts have various effects depending on the type of blood cells involved:  If you have a low number of red blood cells, you have a condition called anemia. This can cause symptoms such as:  Feeling tired and weak.  Feeling light-headed.  Being short of breath.  If you have a low number of white blood cells, you may be at higher risk for infections.  If you have a low number of platelets, you may bleed more easily, or your body may have trouble stopping any bleeding. You may also have more bruising. How to manage symptoms or prevent problems from a low blood count If you have a low blood count, you can take steps to manage symptoms or prevent problems that may develop. The steps to take will depend on which type of blood cell is low. Low red blood cells  Take these steps to help manage the symptoms of anemia:  Go for a walk or do some light exercise each day.  Take short naps during the day.  Eat foods that contain a lot of iron and protein. These include leafy vegetables, meat  and fish, beans, sweet potatoes, and dried fruit such as prunes, raisins, and apricots.  Ask for help with errands and with work that needs to be done around the house. It is important to save your energy.  Take vitamins or supplements-such as iron, vitamin Q22, or folic acid-as told by your health care provider.  Practice relaxation techniques, such as yoga or meditation. Low white blood cells  Take these steps to help prevent infections:  Wash your hands often with warm, soapy water.  Avoid crowds of people and any person who has the flu or a fever.  Take care when cleaning yourself after using the bathroom. Tell your health care provider if you have any rectal sores or bleeding.  Avoid dental work. Check your mouth each day for sores or signs of infection.  Do not share utensils.  Avoid contact with pet waste. Wash your hands after handling pets.  If you get a scrape or cut, clean it thoroughly right away.  Avoid fresh plants or dried flowers.  Do not swim or wade in lakes, ponds, rivers, water parks, or hot tubs.  Follow food safety guidelines. Cook meat thoroughly and wash all raw fruits and vegetables.  You may be instructed to wear a mask when around others to protect yourself. Low platelets  Take these steps to help prevent or control bleeding and bruising:  Use an electric razor for shaving instead of a blade.  Use a soft toothbrush and be careful during oral care. Talk with your cancer care team about whether you should avoid flossing. If your mouth is bleeding, rinse it with ice water.  Avoid activities that could cause injury, such as contact sports.  Talk with your health care provider about using laxatives or stool softeners to avoid constipation.  Do not use medicines such as ibuprofen, aspirin, or naproxen unless your health care provider tells you to.  Limit alcohol use.  Monitor any bleeding closely. If you start bleeding, hold pressure on the area for  5 minutes to stop the bleeding. Bleeding that does not stop is considered an emergency. What treatments can help increase a low blood count? If needed, your health care provider may recommend treatment for a low blood count. Treatment will depend on the type of blood cell that is low and the severity of your condition. Treatment options may include:  Taking medicines to help stimulate the growth of blood cells. This is an option for treating a low red blood cell count. Your health care provider may also recommend that you take iron, folic acid, or vitamin B12 supplements.  Making dietary changes. Including more iron and protein in your diet can help stimulate the growth of red blood cells.  Adjusting your current medicines to help raise blood counts.  Making changes to your treatment plan.  Having a blood transfusion. This may be done if your blood count is very low. Contact a health care provider if:  You feel extremely tired and weak.  You have more bruising or bleeding.  You feel ill or you develop a cough.  You have swelling or redness.  You have mouth sores or a sore throat.  You have painful urination or you have blood in your urine or stool.  You are thinking of taking any new supplements or vitamins or making dietary changes. Get help right away if:  You are short of breath, have chest pain, or feel dizzy.  You have a fever or chills.  You have abdominal pain or diarrhea.  You have bleeding that will not stop. Summary  Cancer treatments such as chemotherapy and radiation can sometimes cause a drop in the supply of blood cells in the body, including red blood cells, white blood cells, and platelets.  If you have a low blood count, you can take steps to manage symptoms or prevent problems that may develop.  Depending on which type of blood cell is low, you may need to take steps to prevent infection, prevent bleeding, or manage symptoms that may develop.  If needed,  your health care provider may recommend treatment for a low blood count. This information is not intended to replace advice given to you by your health care provider. Make sure you discuss any questions you have with your health care provider. Document Released: 12/04/2015 Document Revised: 12/04/2015 Document Reviewed: 12/04/2015 Elsevier Interactive Patient Education  2017 [place] Discharge Instructions for Patients Receiving Chemotherapy  Today you received the following chemotherapy agents: Gemcitabine   To help prevent nausea and vomiting after your treatment, we encourage you to take your nausea medication as prescribed.   If you develop nausea and vomiting that is not controlled by your nausea medication, call the clinic.   BELOW ARE SYMPTOMS THAT SHOULD BE REPORTED IMMEDIATELY:  *FEVER GREATER THAN 100.5 F  *CHILLS WITH OR WITHOUT FEVER  NAUSEA AND VOMITING THAT IS NOT CONTROLLED WITH YOUR NAUSEA MEDICATION  *  UNUSUAL SHORTNESS OF BREATH  *UNUSUAL BRUISING OR BLEEDING  TENDERNESS IN MOUTH AND THROAT WITH OR WITHOUT PRESENCE OF ULCERS  *URINARY PROBLEMS  *BOWEL PROBLEMS  UNUSUAL RASH Items with * indicate a potential emergency and should be followed up as soon as possible.  Feel free to call the clinic you have any questions or concerns. The clinic phone number is [phone].  Please show the [place] [facility] at check-in to the Emergency Department and triage nurse.

## 2016-07-26 ENCOUNTER — Ambulatory Visit: Payer: Medicaid Other

## 2016-07-26 ENCOUNTER — Ambulatory Visit: Payer: Medicaid Other | Admitting: Internal Medicine

## 2016-07-26 ENCOUNTER — Other Ambulatory Visit: Payer: Medicaid Other

## 2016-07-31 ENCOUNTER — Other Ambulatory Visit: Payer: Self-pay | Admitting: *Deleted

## 2016-07-31 DIAGNOSIS — G893 Neoplasm related pain (acute) (chronic): Secondary | ICD-10-CM

## 2016-07-31 MED ORDER — DICLOFENAC SODIUM 1 % TD GEL: 4.0000 g | Freq: Four times a day (QID) | TRANSDERMAL | 3 refills | Status: DC

## 2016-08-01 MED FILL — VOLTAREN 1% GEL: 1 | 12 days supply | Qty: 200 | Fill #0

## 2016-08-02 ENCOUNTER — Encounter: Payer: Self-pay | Admitting: Internal Medicine

## 2016-08-02 ENCOUNTER — Ambulatory Visit (HOSPITAL_BASED_OUTPATIENT_CLINIC_OR_DEPARTMENT_OTHER): Payer: Medicaid Other | Admitting: Internal Medicine

## 2016-08-02 ENCOUNTER — Ambulatory Visit (HOSPITAL_BASED_OUTPATIENT_CLINIC_OR_DEPARTMENT_OTHER): Payer: Medicaid Other

## 2016-08-02 ENCOUNTER — Other Ambulatory Visit (HOSPITAL_COMMUNITY)
Admission: RE | Admit: 2016-08-02 | Discharge: 2016-08-02 | Disposition: A | Discharge status: Home / Self Care | Payer: Medicaid Other | Source: Ambulatory Visit | Attending: Internal Medicine | Admitting: Internal Medicine

## 2016-08-02 ENCOUNTER — Other Ambulatory Visit (HOSPITAL_BASED_OUTPATIENT_CLINIC_OR_DEPARTMENT_OTHER): Payer: Medicaid Other

## 2016-08-02 VITALS — BP 93/54 | HR 83 | Temp 98.4°F | Resp 18

## 2016-08-02 DIAGNOSIS — C7951 Secondary malignant neoplasm of bone: Secondary | ICD-10-CM | POA: Diagnosis not present

## 2016-08-02 DIAGNOSIS — C7931 Secondary malignant neoplasm of brain: Secondary | ICD-10-CM | POA: Diagnosis not present

## 2016-08-02 DIAGNOSIS — Z95828 Presence of other vascular implants and grafts: Secondary | ICD-10-CM | POA: Insufficient documentation

## 2016-08-02 DIAGNOSIS — Z5111 Encounter for antineoplastic chemotherapy: Secondary | ICD-10-CM | POA: Diagnosis present

## 2016-08-02 DIAGNOSIS — C3491 Malignant neoplasm of unspecified part of right bronchus or lung: Secondary | ICD-10-CM

## 2016-08-02 DIAGNOSIS — C3490 Malignant neoplasm of unspecified part of unspecified bronchus or lung: Secondary | ICD-10-CM

## 2016-08-02 LAB — CBC WITH DIFFERENTIAL/PLATELET
BASO%: 0.5 % (ref 0.0–2.0)
BASOS ABS: 0 10*3/uL (ref 0.0–0.1)
EOS ABS: 0.1 10*3/uL (ref 0.0–0.5)
EOS%: 3 % (ref 0.0–7.0)
HEMATOCRIT: 26.8 % — AB (ref 34.8–46.6)
HGB: 9 g/dL — ABNORMAL LOW (ref 11.6–15.9)
LYMPH%: 20.3 % (ref 14.0–49.7)
MCH: 32.8 pg (ref 25.1–34.0)
MCHC: 33.7 g/dL (ref 31.5–36.0)
MCV: 97.2 fL (ref 79.5–101.0)
MONO#: 0.4 10*3/uL (ref 0.1–0.9)
MONO%: 16.1 % — AB (ref 0.0–14.0)
NEUT#: 1.6 10*3/uL (ref 1.5–6.5)
NEUT%: 60.1 % (ref 38.4–76.8)
Platelets: 320 10*3/uL (ref 145–400)
RBC: 2.75 10*6/uL — ABNORMAL LOW (ref 3.70–5.45)
RDW: 16.5 % — ABNORMAL HIGH (ref 11.2–14.5)
WBC: 2.7 10*3/uL — ABNORMAL LOW (ref 3.9–10.3)
lymph#: 0.5 10*3/uL — ABNORMAL LOW (ref 0.9–3.3)

## 2016-08-02 LAB — COMPREHENSIVE METABOLIC PANEL
ALBUMIN: 2.6 g/dL — AB (ref 3.5–5.0)
ALK PHOS: 56 U/L (ref 40–150)
ALT: 7 U/L (ref 0–55)
AST: 10 U/L (ref 5–34)
Anion Gap: 9 mEq/L (ref 3–11)
BUN: 3.2 mg/dL — AB (ref 7.0–26.0)
CO2: 24 mEq/L (ref 22–29)
CREATININE: 0.6 mg/dL (ref 0.6–1.1)
Calcium: 8.6 mg/dL (ref 8.4–10.4)
Chloride: 107 mEq/L (ref 98–109)
EGFR: >90 mL/min/{1.73_m2} (ref >90)
GLUCOSE: 98 mg/dL (ref 70–140)
Potassium: 3.1 mEq/L — ABNORMAL LOW (ref 3.5–5.1)
SODIUM: 140 meq/L (ref 136–145)
Total Protein: 5.8 g/dL — ABNORMAL LOW (ref 6.4–8.3)

## 2016-08-02 MED ORDER — DRONABINOL 2.5 MG PO CAPS: 2.5000 mg | ORAL_CAPSULE | Freq: Two times a day (BID) | ORAL | 0 refills | Status: DC

## 2016-08-02 MED ORDER — SODIUM CHLORIDE 0.9% FLUSH
10.0000 mL | INTRAVENOUS | Status: DC | PRN
  Administered 2016-08-02: 10 mL
  Filled 2016-08-02: qty 10

## 2016-08-02 MED ORDER — PROCHLORPERAZINE MALEATE 10 MG PO TABS
10.0000 mg | ORAL_TABLET | Freq: Once | ORAL | Status: AC
  Administered 2016-08-02: 10 mg via ORAL

## 2016-08-02 MED ORDER — FENTANYL 75 MCG/HR TD PT72: 75.0000 ug | MEDICATED_PATCH | TRANSDERMAL | 0 refills | Status: DC

## 2016-08-02 MED ORDER — SODIUM CHLORIDE 0.9 % IV SOLN
1000.0000 mg/m2 | Freq: Once | INTRAVENOUS | Status: AC
  Administered 2016-08-02: 1748 mg via INTRAVENOUS
  Filled 2016-08-02: qty 45.97

## 2016-08-02 MED ORDER — OXYCODONE-ACETAMINOPHEN 10-325 MG PO TABS: 1.0000 | ORAL_TABLET | Freq: Four times a day (QID) | ORAL | 0 refills | Status: DC | PRN

## 2016-08-02 MED ORDER — HEPARIN SOD (PORK) LOCK FLUSH 100 UNIT/ML IV SOLN
500.0000 [IU] | Freq: Once | INTRAVENOUS | Status: AC | PRN
  Administered 2016-08-02: 500 [IU]
  Filled 2016-08-02: qty 5

## 2016-08-02 MED ORDER — PROCHLORPERAZINE MALEATE 10 MG PO TABS
ORAL_TABLET | ORAL | Status: AC
  Filled 2016-08-02: qty 1

## 2016-08-02 MED ORDER — SODIUM CHLORIDE 0.9 % IV SOLN
Freq: Once | INTRAVENOUS | Status: AC
  Administered 2016-08-02: 14:00:00 via INTRAVENOUS

## 2016-08-02 MED FILL — fentaNYL 75 MCG/HR PT72: 75 | 30 days supply | Qty: 10 | Fill #0

## 2016-08-02 MED FILL — OXYCODONE-APAP 10-325 MG TA: 10-325 | 15 days supply | Qty: 60 | Fill #0

## 2016-08-02 MED FILL — DRONABINOL 2.5 MG CAPSULE: 2.5 | 30 days supply | Qty: 60 | Fill #0

## 2016-08-02 NOTE — Patient Instructions (Signed)
[facility] Discharge Instructions for Patients Receiving Chemotherapy  Today you received the following chemotherapy agents: Gemcitabine   To help prevent nausea and vomiting after your treatment, we encourage you to take your nausea medication as prescribed.   If you develop nausea and vomiting that is not controlled by your nausea medication, call the clinic.   BELOW ARE SYMPTOMS THAT SHOULD BE REPORTED IMMEDIATELY:  *FEVER GREATER THAN 100.5 F  *CHILLS WITH OR WITHOUT FEVER  NAUSEA AND VOMITING THAT IS NOT CONTROLLED WITH YOUR NAUSEA MEDICATION  *UNUSUAL SHORTNESS OF BREATH  *UNUSUAL BRUISING OR BLEEDING  TENDERNESS IN MOUTH AND THROAT WITH OR WITHOUT PRESENCE OF ULCERS  *URINARY PROBLEMS  *BOWEL PROBLEMS  UNUSUAL RASH Items with * indicate a potential emergency and should be followed up as soon as possible.  Feel free to call the clinic you have any questions or concerns. The clinic phone number is [phone].  Please show the CHEMO ALERT CARD at check-in to the Emergency Department and triage nurse.    

## 2016-08-02 NOTE — Progress Notes (Signed)
[name]   [phone]) [phone]  OFFICE PROGRESS NOTE  Andrea Nims, MD [place] [address]  DIAGNOSIS: Stage IV (T1a, N2, M1b) non-small cell lung cancer, adenocarcinoma with negative EGFR mutation and presented with a right hilar mass, subcarinal lymphadenopathy as well as a large soft tissue mass in the posterior iliac bone and metastatic bone lesions diagnosed in August 2016.  PRIOR THERAPY: 1) Status post palliative radiotherapy to the soft tissue mass in the left iliac bone under the care of Dr. Valere Dross. 2) Systemic chemotherapy with carboplatin for AUC of 5 and Alimta 500 MG/M2 every 3 weeks. First dose expected on 02/22/2015. Status post 6 cycles. Starting from cycle #3 Alimta was reduced to 400 MG/M2 secondary to neutropenia after the first 2 cycles. 3) Maintenance systemic chemotherapy with single agent Alimta 500 MG/M2 every 3 weeks. First dose 07/19/2015. Status post 3 cycles. Last dose was given 08/30/2015 discontinued secondary to disease progression. 4)  Immunotherapy treatment with Tecentriq (Atezolizumab) 1200 MG IV every 3 weeks, for his dose 10/14/2015. Status post 6 cycles, discontinued secondary to disease progression. 5) whole brain irradiation for multiple new brain metastasis expected to be completed on 02/12/2016. 6) palliative radiotherapy to the progressive bone lesion in the left hip. 7) systemic chemotherapy with docetaxel 75 MG/M2 and Cyramza 10 MG/KG every 3 weeks status post 5 cycles. Discontinued today secondary to disease progression.  CURRENT THERAPY:  1) systemic chemotherapy with gemcitabine 1000 MG/M2 on days 1 and 8 every 3 weeks. First dose 07/12/2016. 2) Xgeva 120 mg subcutaneously on monthly basis for metastatic bone disease  INTERVAL HISTORY: Andrea [name] 46 y.o. female was seen at the chemotherapy treatment room today for follow-up visit. The patient was started on treatment with single  agent gemcitabine status post 1 cycle and she tolerated the first cycle of her treatment well. She continues to have fatigue and weakness as well as pain on the left hip area. She is requesting refill of her pain medication was fentanyl patch and Percocet. She is also complaining of lack of appetite and she would like to have a appetite stimulant. She had mild nausea. She denied having any fever or chills. She has no vomiting, diarrhea or constipation. She has no chest pain, shortness of breath, cough or hemoptysis. She is here today for evaluation before starting cycle #2.  MEDICAL HISTORY: Past Medical History:  Diagnosis Date  . Adrenal mass, left ([facility]) 09/21/2015  . Anginal pain ([facility])    chest pain not sure what related to  . Anxiety   . Bone cancer ([facility])    lung ca with bone mets  . Chronic back pain   . Depression   . Dysmenorrhea 12/06/2009   Qualifier: Diagnosis of  By: [name] RN, Andrea [name]    . Encounter for antineoplastic immunotherapy 10/07/2015  . Family history of adverse reaction to anesthesia    mother stopped breathing after surgery  . Goals of care, counseling/discussion 05/03/2016  . Headache    stress  . Low back pain radiating to left leg 11/29/2010  . Non-small cell carcinoma of right lung, stage 4 ([facility]) 01/01/2015  . S/P radiation therapy 01/01/2015 through 01/21/2015     Left iliac bone 3500 cGy in 14 sessions   . Shortness of breath dyspnea    with exertion, Pt denies 02/2015    ALLERGIES:  is allergic to morphine and related and hydrocodone.  MEDICATIONS:  Current Outpatient  Prescriptions  Medication Sig Dispense Refill  . diclofenac sodium (VOLTAREN) 1 % GEL Apply 4 g topically 4 (four) times daily. 200 g 3  . fentaNYL (DURAGESIC - DOSED MCG/HR) 75 MCG/HR Place 1 patch (75 mcg total) onto the skin every 3 (three) days. 10 patch 0  . levETIRAcetam (KEPPRA) 1000 MG tablet Take 1 tablet (1,000 mg  total) by mouth 2 (two) times daily. (Patient not taking: Reported on 05/03/2016) 60 tablet 0  . lidocaine-prilocaine (EMLA) cream Apply 1 teaspoon to skin 1 to 2 hours before use cover to keep in contact with skin 30 g PRN  . loratadine (CLARITIN) 10 MG tablet Take 1 tablet (10 mg total) by mouth daily.    . Multiple Vitamins-Minerals (MULTIVITAMIN WITH MINERALS) tablet Take by mouth.    . ondansetron (ZOFRAN) 4 MG tablet Take 1 tablet (4 mg total) by mouth every 8 (eight) hours as needed for nausea or vomiting. 20 tablet 0  . oxyCODONE-acetaminophen (PERCOCET) 10-325 MG tablet Take 1 tablet by mouth every 6 (six) hours as needed for pain. 60 tablet 0  . senna-docusate (SENOKOT-S) 8.6-50 MG tablet Take 2 tablets by mouth at bedtime. 30 tablet 0   No current facility-administered medications for this visit.     SURGICAL HISTORY:  Past Surgical History:  Procedure Laterality Date  . laproscopy surgery to check her tubes    . LUMBAR LAMINECTOMY/DECOMPRESSION MICRODISCECTOMY Left 05/01/2014   Procedure: Microdiscectomy - L5-S1 - left;  Surgeon: Eustace Moore, MD;  Location: [facility];  Service: Neurosurgery;  Laterality: Left;    REVIEW OF SYSTEMS:  A comprehensive review of systems was negative except for: Constitutional: positive for fatigue Musculoskeletal: positive for bone pain   PHYSICAL EXAMINATION: General appearance: alert, cooperative, fatigued and no distress Head: Normocephalic, without obvious abnormality, atraumatic Neck: no adenopathy, no JVD, supple, symmetrical, trachea midline and thyroid not enlarged, symmetric, no tenderness/mass/nodules Lymph nodes: Cervical, supraclavicular, and axillary nodes normal. Resp: clear to auscultation bilaterally Back: symmetric, no curvature. ROM normal. No CVA tenderness. Cardio: regular rate and rhythm, S1, S2 normal, no murmur, click, rub or gallop GI: soft, non-tender; bowel sounds normal; no masses,  no organomegaly Extremities: extremities  normal, atraumatic, no cyanosis or edema  ECOG PERFORMANCE STATUS: 2 - Symptomatic, <50% confined to bed  Blood pressure (!) 93/54, pulse 83, temperature 98.4 F (36.9 C), temperature source Oral, resp. rate 18, height 5' 6"  (1.676 m), SpO2 99 %.  LABORATORY DATA: Lab Results  Component Value Date   WBC 2.7 (L) 08/02/2016   HGB 9.0 (L) 08/02/2016   HCT 26.8 (L) 08/02/2016   MCV 97.2 08/02/2016   PLT 320 08/02/2016      Chemistry      Component Value Date/Time   NA 136 07/19/2016 1202   K 3.8 07/19/2016 1202   CL 108 (H) 05/22/2016 1546   CO2 23 07/19/2016 1202   BUN 6.8 (L) 07/19/2016 1202   CREATININE 0.8 07/19/2016 1202      Component Value Date/Time   CALCIUM 9.1 07/19/2016 1202   ALKPHOS 60 07/19/2016 1202   AST 13 07/19/2016 1202   ALT 11 07/19/2016 1202   BILITOT 0.38 07/19/2016 1202       RADIOGRAPHIC STUDIES: Ct Chest W Contrast  Result Date: 07/03/2016 CLINICAL DATA:  Patient with history of right lung cancer. Follow-up evaluation. EXAM: CT CHEST, ABDOMEN, AND PELVIS WITH CONTRAST TECHNIQUE: Multidetector CT imaging of the chest, abdomen and pelvis was performed following the standard protocol during  bolus administration of intravenous contrast. CONTRAST:  100 cc ISOVUE-300 IOPAMIDOL (ISOVUE-300) INJECTION 61% COMPARISON:  CT chest abdomen pelvis 04/12/2016. FINDINGS: CT CHEST FINDINGS Cardiovascular: Right anterior chest wall Port-A-Cath is present with tip terminating at the superior vena cava. Normal heart size. No pericardial effusion. Aorta and main pulmonary artery normal in caliber. Mediastinum/Nodes: Interval development of an 11 mm right peritracheal lymph node (image 19; series 2). The esophagus is normal in appearance. Lungs/Pleura: Central airways are patent. Atelectasis within the lingula and right middle lobe. Centrilobular emphysematous change. Unchanged 2 mm left upper lobe pulmonary nodule (image 16; series 4). Interval increase in size of irregular  right lower lobe nodule measuring 2.5 x 2.1 cm (image 78; series 4), previously 1.9 x 1.3 cm. Unchanged 2 mm nodule within the right middle lobe (image 85; series 4). No pleural effusion or pneumothorax. Musculoskeletal: No aggressive or acute appearing osseous lesions. CT ABDOMEN PELVIS FINDINGS Hepatobiliary: Liver is normal in size and contour. No focal lesion identified. Gallbladder is unremarkable. No intrahepatic or extrahepatic biliary ductal dilatation. Pancreas: Unremarkable Spleen: Unremarkable Adrenals/Urinary Tract: Interval increase in size of nodules involving the bilateral adrenal glands. The left adrenal nodule measures 4.0 x 1.7 cm (image 54; series 2), previously 3.4 x 1.7 cm. The right adrenal nodule measures 3.7 x 2.2 cm (image 50; series 2), previously 2.3 x 1.6 cm. Kidneys enhance symmetrically with contrast. Unchanged low-attenuation renal lesions bilaterally. No hydronephrosis. Urinary bladder is unremarkable. Stomach/Bowel: No abnormal bowel wall thickening or evidence for bowel obstruction. Normal appendix. No free fluid or free intraperitoneal air. Vascular/Lymphatic: Normal caliber abdominal aorta. No retroperitoneal lymphadenopathy. Peripheral calcified atherosclerotic plaque. Reproductive: Uterus and adnexal structures unremarkable. Other: None. Musculoskeletal: Re- demonstrated mixed lytic and sclerotic lesion within the posterior left ilium measuring 3.1 x 1.9 cm, grossly unchanged from prior (image 89; series 2). Re- demonstrated relatively ill defined lytic lesion within the left lateral aspect of the sacrum adjacent to the SI joint. Lumbar spine degenerative changes. IMPRESSION: Interval increase in size of right lower lobe nodule with new right peritracheal adenopathy. Interval increase in size of bilateral adrenal metastasis. Similar appearing osseous metastasis involving the bony pelvis. Aortic atherosclerosis. Electronically Signed   By: Lovey Newcomer M.D.   On: 07/03/2016 15:23     Ct Abdomen Pelvis W Contrast  Result Date: 07/03/2016 CLINICAL DATA:  Patient with history of right lung cancer. Follow-up evaluation. EXAM: CT CHEST, ABDOMEN, AND PELVIS WITH CONTRAST TECHNIQUE: Multidetector CT imaging of the chest, abdomen and pelvis was performed following the standard protocol during bolus administration of intravenous contrast. CONTRAST:  100 cc ISOVUE-300 IOPAMIDOL (ISOVUE-300) INJECTION 61% COMPARISON:  CT chest abdomen pelvis 04/12/2016. FINDINGS: CT CHEST FINDINGS Cardiovascular: Right anterior chest wall Port-A-Cath is present with tip terminating at the superior vena cava. Normal heart size. No pericardial effusion. Aorta and main pulmonary artery normal in caliber. Mediastinum/Nodes: Interval development of an 11 mm right peritracheal lymph node (image 19; series 2). The esophagus is normal in appearance. Lungs/Pleura: Central airways are patent. Atelectasis within the lingula and right middle lobe. Centrilobular emphysematous change. Unchanged 2 mm left upper lobe pulmonary nodule (image 16; series 4). Interval increase in size of irregular right lower lobe nodule measuring 2.5 x 2.1 cm (image 78; series 4), previously 1.9 x 1.3 cm. Unchanged 2 mm nodule within the right middle lobe (image 85; series 4). No pleural effusion or pneumothorax. Musculoskeletal: No aggressive or acute appearing osseous lesions. CT ABDOMEN PELVIS FINDINGS Hepatobiliary: Liver is normal in  size and contour. No focal lesion identified. Gallbladder is unremarkable. No intrahepatic or extrahepatic biliary ductal dilatation. Pancreas: Unremarkable Spleen: Unremarkable Adrenals/Urinary Tract: Interval increase in size of nodules involving the bilateral adrenal glands. The left adrenal nodule measures 4.0 x 1.7 cm (image 54; series 2), previously 3.4 x 1.7 cm. The right adrenal nodule measures 3.7 x 2.2 cm (image 50; series 2), previously 2.3 x 1.6 cm. Kidneys enhance symmetrically with contrast. Unchanged  low-attenuation renal lesions bilaterally. No hydronephrosis. Urinary bladder is unremarkable. Stomach/Bowel: No abnormal bowel wall thickening or evidence for bowel obstruction. Normal appendix. No free fluid or free intraperitoneal air. Vascular/Lymphatic: Normal caliber abdominal aorta. No retroperitoneal lymphadenopathy. Peripheral calcified atherosclerotic plaque. Reproductive: Uterus and adnexal structures unremarkable. Other: None. Musculoskeletal: Re- demonstrated mixed lytic and sclerotic lesion within the posterior left ilium measuring 3.1 x 1.9 cm, grossly unchanged from prior (image 89; series 2). Re- demonstrated relatively ill defined lytic lesion within the left lateral aspect of the sacrum adjacent to the SI joint. Lumbar spine degenerative changes. IMPRESSION: Interval increase in size of right lower lobe nodule with new right peritracheal adenopathy. Interval increase in size of bilateral adrenal metastasis. Similar appearing osseous metastasis involving the bony pelvis. Aortic atherosclerosis. Electronically Signed   By: Lovey Newcomer M.D.   On: 07/03/2016 15:23   Ct Femur Left W Contrast  Result Date: 07/03/2016 CLINICAL DATA:  History of lung carcinoma with bone metastases. The patient is currently undergoing chemotherapy. Bilateral femur pain. EXAM: CT OF THE RIGHT AND LEFT UPPER LEGS EXTREMITY WITH CONTRAST TECHNIQUE: Multidetector CT imaging of the lower right extremity was performed according to the standard protocol following intravenous contrast administration. COMPARISON:  CT chest, abdomen and pelvis this same day. MRI of the pelvis 03/15/2016. Plain films left hip 12/31/2014. CONTRAST:  100 cc Omnipaque 350. FINDINGS: Bones/Joint/Cartilage No lytic or sclerotic lesion is identified. No bony destructive change is seen. Mild periosteal reaction is seen along the distal cortex of both the right and left femur and appears fairly symmetric. No fracture or dislocation is identified.  Ligaments Suboptimally assessed by CT. Muscles and Tendons Intact and normal in appearance. No soft tissue mass or fluid collection is identified Soft tissues Appear normal bilaterally. No fluid collection or mass is identified. IMPRESSION: Negative for metastatic disease. No focal lesion or finding to explain the patient's pain. Mild, symmetric periosteal reaction along the medial cortex of both distal femurs may be due to remote stress change. Electronically Signed   By: Inge Rise M.D.   On: 07/03/2016 15:38   Ct Femur Right W Contrast  Result Date: 07/03/2016 CLINICAL DATA:  History of lung carcinoma with bone metastases. The patient is currently undergoing chemotherapy. Bilateral femur pain. EXAM: CT OF THE RIGHT AND LEFT UPPER LEGS EXTREMITY WITH CONTRAST TECHNIQUE: Multidetector CT imaging of the lower right extremity was performed according to the standard protocol following intravenous contrast administration. COMPARISON:  CT chest, abdomen and pelvis this same day. MRI of the pelvis 03/15/2016. Plain films left hip 12/31/2014. CONTRAST:  100 cc Omnipaque 350. FINDINGS: Bones/Joint/Cartilage No lytic or sclerotic lesion is identified. No bony destructive change is seen. Mild periosteal reaction is seen along the distal cortex of both the right and left femur and appears fairly symmetric. No fracture or dislocation is identified. Ligaments Suboptimally assessed by CT. Muscles and Tendons Intact and normal in appearance. No soft tissue mass or fluid collection is identified Soft tissues Appear normal bilaterally. No fluid collection or mass is identified. IMPRESSION:  Negative for metastatic disease. No focal lesion or finding to explain the patient's pain. Mild, symmetric periosteal reaction along the medial cortex of both distal femurs may be due to remote stress change. Electronically Signed   By: Inge Rise M.D.   On: 07/03/2016 15:38    ASSESSMENT AND PLAN:  This is a very pleasant 46  years old African-American female was metastatic non-small cell lung cancer, adenocarcinoma with bone and brain metastasis. The patient underwent several chemotherapy regimens as well as treatment with immunotherapy and she had disease progression recently. She is now undergoing treatment with systemic chemotherapy with single agent gemcitabine status post 1 cycle. She tolerated the first cycle of her treatment well with no significant adverse effects. For pain management I gave the patient a refill of fentanyl patch as well as Percocet. For the lack of appetite and persistent nausea, I will start the patient on Marinol 2.5 mg by mouth twice a day and she was advised to take her Marinol dose away from the pain medication time. She will come back for follow-up visit in 3 weeks for evaluation before starting cycle #3. She was advised to call immediately if she has any concerning symptoms in the interval. The patient voices understanding of current disease status and treatment options and is in agreement with the current care plan. All questions were answered. The patient knows to call the clinic with any problems, questions or concerns. We can certainly see the patient much sooner if necessary. I spent 10 minutes counseling the patient face to face. The total time spent in the appointment was 15 minutes.  Disclaimer: This note was dictated with voice recognition software. Similar sounding words can inadvertently be transcribed and may not be corrected upon review.

## 2016-08-03 ENCOUNTER — Ambulatory Visit: Payer: Self-pay | Admitting: Neurology

## 2016-08-03 ENCOUNTER — Telehealth: Payer: Self-pay | Admitting: Internal Medicine

## 2016-08-03 NOTE — Telephone Encounter (Signed)
Calling to confirm appointment for 08/04/16 at 2:00 lmtcb

## 2016-08-04 ENCOUNTER — Ambulatory Visit: Payer: Medicaid Other

## 2016-08-04 ENCOUNTER — Telehealth: Payer: Self-pay | Admitting: Internal Medicine

## 2016-08-04 NOTE — Telephone Encounter (Signed)
Left message that appt has been changed for May and patient should pick up new schedule next visit.

## 2016-08-07 ENCOUNTER — Ambulatory Visit: Payer: Self-pay | Admitting: Neurology

## 2016-08-09 ENCOUNTER — Ambulatory Visit (HOSPITAL_BASED_OUTPATIENT_CLINIC_OR_DEPARTMENT_OTHER): Payer: Medicaid Other

## 2016-08-09 ENCOUNTER — Other Ambulatory Visit (HOSPITAL_BASED_OUTPATIENT_CLINIC_OR_DEPARTMENT_OTHER): Payer: Medicaid Other

## 2016-08-09 ENCOUNTER — Other Ambulatory Visit: Payer: Self-pay | Admitting: *Deleted

## 2016-08-09 VITALS — BP 106/64 | HR 66 | Temp 98.3°F | Resp 16

## 2016-08-09 DIAGNOSIS — Z5111 Encounter for antineoplastic chemotherapy: Secondary | ICD-10-CM | POA: Diagnosis not present

## 2016-08-09 DIAGNOSIS — C7951 Secondary malignant neoplasm of bone: Secondary | ICD-10-CM | POA: Diagnosis not present

## 2016-08-09 DIAGNOSIS — C3491 Malignant neoplasm of unspecified part of right bronchus or lung: Secondary | ICD-10-CM

## 2016-08-09 DIAGNOSIS — C7931 Secondary malignant neoplasm of brain: Secondary | ICD-10-CM | POA: Diagnosis not present

## 2016-08-09 DIAGNOSIS — M898X9 Other specified disorders of bone, unspecified site: Secondary | ICD-10-CM

## 2016-08-09 DIAGNOSIS — Z95828 Presence of other vascular implants and grafts: Secondary | ICD-10-CM

## 2016-08-09 LAB — COMPREHENSIVE METABOLIC PANEL
ALT: 14 U/L (ref 0–55)
AST: 28 U/L (ref 5–34)
Albumin: 2.5 g/dL — ABNORMAL LOW (ref 3.5–5.0)
Alkaline Phosphatase: 64 U/L (ref 40–150)
Anion Gap: 10 mEq/L (ref 3–11)
BUN: 2.4 mg/dL — AB (ref 7.0–26.0)
CALCIUM: 8.6 mg/dL (ref 8.4–10.4)
CHLORIDE: 108 meq/L (ref 98–109)
CO2: 22 mEq/L (ref 22–29)
Creatinine: 0.6 mg/dL (ref 0.6–1.1)
EGFR: >90 mL/min/{1.73_m2} (ref >90)
Glucose: 97 mg/dl (ref 70–140)
POTASSIUM: 2.9 meq/L — AB (ref 3.5–5.1)
Sodium: 140 mEq/L (ref 136–145)
Total Bilirubin: 0.34 mg/dL (ref 0.20–1.20)
Total Protein: 6.3 g/dL — ABNORMAL LOW (ref 6.4–8.3)

## 2016-08-09 LAB — CBC WITH DIFFERENTIAL/PLATELET
BASO%: 0.5 % (ref 0.0–2.0)
BASOS ABS: 0 10*3/uL (ref 0.0–0.1)
EOS%: 0.5 % (ref 0.0–7.0)
Eosinophils Absolute: 0 10*3/uL (ref 0.0–0.5)
HEMATOCRIT: 26.8 % — AB (ref 34.8–46.6)
HGB: 8.9 g/dL — ABNORMAL LOW (ref 11.6–15.9)
LYMPH%: 34.6 % (ref 14.0–49.7)
MCH: 32 pg (ref 25.1–34.0)
MCHC: 33.2 g/dL (ref 31.5–36.0)
MCV: 96.4 fL (ref 79.5–101.0)
MONO#: 0.5 10*3/uL (ref 0.1–0.9)
MONO%: 22.3 % — ABNORMAL HIGH (ref 0.0–14.0)
NEUT#: 0.9 10*3/uL — ABNORMAL LOW (ref 1.5–6.5)
NEUT%: 42.1 % (ref 38.4–76.8)
Platelets: 228 10*3/uL (ref 145–400)
RBC: 2.78 10*6/uL — AB (ref 3.70–5.45)
RDW: 16.1 % — ABNORMAL HIGH (ref 11.2–14.5)
WBC: 2.1 10*3/uL — ABNORMAL LOW (ref 3.9–10.3)
lymph#: 0.7 10*3/uL — ABNORMAL LOW (ref 0.9–3.3)

## 2016-08-09 MED ORDER — POTASSIUM CHLORIDE CRYS ER 20 MEQ PO TBCR: 40.0000 meq | EXTENDED_RELEASE_TABLET | Freq: Every day | ORAL | 0 refills | Status: DC

## 2016-08-09 MED ORDER — OXYCODONE-ACETAMINOPHEN 5-325 MG PO TABS
1.0000 | ORAL_TABLET | Freq: Once | ORAL | Status: AC
Start: 2016-08-09 — End: 2016-08-09
  Administered 2016-08-09: 1 via ORAL

## 2016-08-09 MED ORDER — SODIUM CHLORIDE 0.9 % IV SOLN
Freq: Once | INTRAVENOUS | Status: AC
Start: 2016-08-09 — End: 2016-08-09
  Administered 2016-08-09: 11:00:00 via INTRAVENOUS

## 2016-08-09 MED ORDER — PROCHLORPERAZINE MALEATE 10 MG PO TABS
ORAL_TABLET | ORAL | Status: AC
  Filled 2016-08-09: qty 1

## 2016-08-09 MED ORDER — OXYCODONE-ACETAMINOPHEN 5-325 MG PO TABS
ORAL_TABLET | ORAL | Status: AC
  Filled 2016-08-09: qty 1

## 2016-08-09 MED ORDER — ALTEPLASE 2 MG IJ SOLR
2.0000 mg | Freq: Once | INTRAMUSCULAR | Status: DC | PRN
  Filled 2016-08-09: qty 2

## 2016-08-09 MED ORDER — PROCHLORPERAZINE MALEATE 10 MG PO TABS
10.0000 mg | ORAL_TABLET | Freq: Once | ORAL | Status: AC
  Administered 2016-08-09: 10 mg via ORAL

## 2016-08-09 MED ORDER — POTASSIUM CHLORIDE 2 MEQ/ML IV SOLN
Freq: Once | INTRAVENOUS | Status: AC
  Administered 2016-08-09: 13:00:00 via INTRAVENOUS
  Filled 2016-08-09: qty 250

## 2016-08-09 MED ORDER — DENOSUMAB 120 MG/1.7ML ~~LOC~~ SOLN
120.0000 mg | Freq: Once | SUBCUTANEOUS | Status: AC
  Administered 2016-08-09: 120 mg via SUBCUTANEOUS
  Filled 2016-08-09: qty 1.7

## 2016-08-09 MED ORDER — SODIUM CHLORIDE 0.9% FLUSH
10.0000 mL | INTRAVENOUS | Status: DC | PRN
  Administered 2016-08-09: 10 mL
  Filled 2016-08-09: qty 10

## 2016-08-09 MED ORDER — GEMCITABINE HCL CHEMO INJECTION 1 GM/26.3ML
1000.0000 mg/m2 | Freq: Once | INTRAVENOUS | Status: AC
  Administered 2016-08-09: 1748 mg via INTRAVENOUS
  Filled 2016-08-09: qty 45.97

## 2016-08-09 MED ORDER — HEPARIN SOD (PORK) LOCK FLUSH 100 UNIT/ML IV SOLN
500.0000 [IU] | Freq: Once | INTRAVENOUS | Status: AC | PRN
  Administered 2016-08-09: 500 [IU]
  Filled 2016-08-09: qty 5

## 2016-08-09 MED FILL — POTASSIUM CL ER 20 MEQ TABL: 20 | 10 days supply | Qty: 20 | Fill #0

## 2016-08-09 NOTE — Patient Instructions (Signed)
[facility] Discharge Instructions for Patients Receiving Chemotherapy  Today you received the following chemotherapy agents gemzar  To help prevent nausea and vomiting after your treatment, we encourage you to take your nausea medication as directed.  If you develop nausea and vomiting that is not controlled by your nausea medication, call the clinic.   BELOW ARE SYMPTOMS THAT SHOULD BE REPORTED IMMEDIATELY:  *FEVER GREATER THAN 100.5 F  *CHILLS WITH OR WITHOUT FEVER  NAUSEA AND VOMITING THAT IS NOT CONTROLLED WITH YOUR NAUSEA MEDICATION  *UNUSUAL SHORTNESS OF BREATH  *UNUSUAL BRUISING OR BLEEDING  TENDERNESS IN MOUTH AND THROAT WITH OR WITHOUT PRESENCE OF ULCERS  *URINARY PROBLEMS  *BOWEL PROBLEMS  UNUSUAL RASH Items with * indicate a potential emergency and should be followed up as soon as possible.  Feel free to call the clinic you have any questions or concerns. The clinic phone number is [phone].  

## 2016-08-09 NOTE — Progress Notes (Signed)
Reviewed CBC with Dr. Julien Nordmann, ok to move forward today with treatment.  Pt instructed to be very strict with neutropenic precautions, including handwashing, limiting exposure to crowds, looking for signs of infection, checking temperature frequently.  Pt verbalized understanding and wanted to move forward with treatment.

## 2016-08-09 NOTE — Telephone Encounter (Signed)
Rx sent to pt pharmacy. Pt nofitied

## 2016-08-11 ENCOUNTER — Ambulatory Visit (INDEPENDENT_AMBULATORY_CARE_PROVIDER_SITE_OTHER): Payer: Medicaid Other | Admitting: *Deleted

## 2016-08-11 DIAGNOSIS — N946 Dysmenorrhea, unspecified: Secondary | ICD-10-CM | POA: Diagnosis not present

## 2016-08-11 DIAGNOSIS — Z308 Encounter for other contraceptive management: Secondary | ICD-10-CM | POA: Diagnosis present

## 2016-08-11 MED ORDER — MEDROXYPROGESTERONE ACETATE 150 MG/ML IM SUSP
150.0000 mg | Freq: Once | INTRAMUSCULAR | Status: AC
  Administered 2016-08-11: 150 mg via INTRAMUSCULAR

## 2016-08-11 NOTE — Progress Notes (Signed)
depo 

## 2016-08-16 ENCOUNTER — Ambulatory Visit: Payer: Medicaid Other | Admitting: Internal Medicine

## 2016-08-16 ENCOUNTER — Other Ambulatory Visit: Payer: Medicaid Other

## 2016-08-16 ENCOUNTER — Ambulatory Visit: Payer: Medicaid Other

## 2016-08-17 NOTE — Progress Notes (Signed)
08/17/2016 Counseling intern called to check in with patient, but she was unavailable. Counselor left voicemail message offering encouragement and support.  Darreld Hoffer A. Philbert Riser, Counseling Intern Department for [facility] and [facility] Supervisor - [place], [place]

## 2016-08-23 ENCOUNTER — Ambulatory Visit (HOSPITAL_BASED_OUTPATIENT_CLINIC_OR_DEPARTMENT_OTHER): Payer: Medicaid Other | Admitting: Nurse Practitioner

## 2016-08-23 ENCOUNTER — Ambulatory Visit (HOSPITAL_BASED_OUTPATIENT_CLINIC_OR_DEPARTMENT_OTHER): Payer: Medicaid Other

## 2016-08-23 ENCOUNTER — Ambulatory Visit: Payer: Medicaid Other

## 2016-08-23 ENCOUNTER — Other Ambulatory Visit (HOSPITAL_BASED_OUTPATIENT_CLINIC_OR_DEPARTMENT_OTHER): Payer: Medicaid Other

## 2016-08-23 VITALS — BP 105/71 | HR 96 | Temp 98.4°F | Resp 18 | Wt 136.0 lb

## 2016-08-23 DIAGNOSIS — C3490 Malignant neoplasm of unspecified part of unspecified bronchus or lung: Secondary | ICD-10-CM

## 2016-08-23 DIAGNOSIS — C3491 Malignant neoplasm of unspecified part of right bronchus or lung: Secondary | ICD-10-CM

## 2016-08-23 DIAGNOSIS — C7931 Secondary malignant neoplasm of brain: Secondary | ICD-10-CM | POA: Diagnosis not present

## 2016-08-23 DIAGNOSIS — Z5111 Encounter for antineoplastic chemotherapy: Secondary | ICD-10-CM | POA: Diagnosis not present

## 2016-08-23 DIAGNOSIS — C7951 Secondary malignant neoplasm of bone: Secondary | ICD-10-CM

## 2016-08-23 DIAGNOSIS — G893 Neoplasm related pain (acute) (chronic): Secondary | ICD-10-CM

## 2016-08-23 LAB — COMPREHENSIVE METABOLIC PANEL
ALBUMIN: 2.8 g/dL — AB (ref 3.5–5.0)
ALK PHOS: 83 U/L (ref 40–150)
ALT: 8 U/L (ref 0–55)
ANION GAP: 9 meq/L (ref 3–11)
AST: 12 U/L (ref 5–34)
BILIRUBIN TOTAL: 0.34 mg/dL (ref 0.20–1.20)
BUN: 2.1 mg/dL — ABNORMAL LOW (ref 7.0–26.0)
CO2: 21 meq/L — AB (ref 22–29)
Calcium: 8.3 mg/dL — ABNORMAL LOW (ref 8.4–10.4)
Chloride: 109 mEq/L (ref 98–109)
Creatinine: 0.6 mg/dL (ref 0.6–1.1)
Glucose: 96 mg/dl (ref 70–140)
Potassium: 3.8 mEq/L (ref 3.5–5.1)
Sodium: 139 mEq/L (ref 136–145)
TOTAL PROTEIN: 6.6 g/dL (ref 6.4–8.3)

## 2016-08-23 LAB — CBC WITH DIFFERENTIAL/PLATELET
BASO%: 0.2 % (ref 0.0–2.0)
BASOS ABS: 0 10*3/uL (ref 0.0–0.1)
EOS ABS: 0 10*3/uL (ref 0.0–0.5)
EOS%: 0.9 % (ref 0.0–7.0)
HCT: 29.1 % — ABNORMAL LOW (ref 34.8–46.6)
HGB: 10 g/dL — ABNORMAL LOW (ref 11.6–15.9)
LYMPH%: 17 % (ref 14.0–49.7)
MCH: 33.7 pg (ref 25.1–34.0)
MCHC: 34.4 g/dL (ref 31.5–36.0)
MCV: 98.1 fL (ref 79.5–101.0)
MONO#: 0.7 10*3/uL (ref 0.1–0.9)
MONO%: 18 % — ABNORMAL HIGH (ref 0.0–14.0)
NEUT%: 63.9 % (ref 38.4–76.8)
NEUTROS ABS: 2.6 10*3/uL (ref 1.5–6.5)
PLATELETS: 549 10*3/uL — AB (ref 145–400)
RBC: 2.97 10*6/uL — AB (ref 3.70–5.45)
RDW: 18.6 % — ABNORMAL HIGH (ref 11.2–14.5)
WBC: 4.1 10*3/uL (ref 3.9–10.3)
lymph#: 0.7 10*3/uL — ABNORMAL LOW (ref 0.9–3.3)

## 2016-08-23 MED ORDER — SODIUM CHLORIDE 0.9% FLUSH
10.0000 mL | INTRAVENOUS | Status: DC | PRN
  Administered 2016-08-23: 10 mL
  Filled 2016-08-23: qty 10

## 2016-08-23 MED ORDER — ONDANSETRON HCL 8 MG PO TABS: 4.0000 mg | ORAL_TABLET | Freq: Three times a day (TID) | ORAL | 0 refills | Status: DC | PRN

## 2016-08-23 MED ORDER — OXYCODONE-ACETAMINOPHEN 10-325 MG PO TABS: 1.0000 | ORAL_TABLET | Freq: Four times a day (QID) | ORAL | 0 refills | Status: DC | PRN

## 2016-08-23 MED ORDER — SODIUM CHLORIDE 0.9 % IV SOLN
1000.0000 mL | Freq: Once | INTRAVENOUS | Status: AC
  Administered 2016-08-23: 09:00:00 via INTRAVENOUS

## 2016-08-23 MED ORDER — SODIUM CHLORIDE 0.9 % IV SOLN: Freq: Once | INTRAVENOUS | Status: DC

## 2016-08-23 MED ORDER — SODIUM CHLORIDE 0.9 % IV SOLN
1000.0000 mg/m2 | Freq: Once | INTRAVENOUS | Status: AC
  Administered 2016-08-23: 1748 mg via INTRAVENOUS
  Filled 2016-08-23: qty 45.97

## 2016-08-23 MED ORDER — HEPARIN SOD (PORK) LOCK FLUSH 100 UNIT/ML IV SOLN
500.0000 [IU] | Freq: Once | INTRAVENOUS | Status: AC | PRN
  Administered 2016-08-23: 500 [IU]
  Filled 2016-08-23: qty 5

## 2016-08-23 MED ORDER — ONDANSETRON HCL 4 MG/2ML IJ SOLN
INTRAMUSCULAR | Status: AC
  Filled 2016-08-23: qty 4

## 2016-08-23 MED ORDER — ONDANSETRON HCL 4 MG/2ML IJ SOLN
8.0000 mg | Freq: Once | INTRAMUSCULAR | Status: AC
  Administered 2016-08-23: 8 mg via INTRAVENOUS

## 2016-08-23 MED FILL — OXYCODONE-APAP 10-325: 10-325 | 15 days supply | Qty: 60 | Fill #0

## 2016-08-23 MED FILL — ONDANSETRON HCL 8 MG TABLET: 8 | 13 days supply | Qty: 20 | Fill #0

## 2016-08-23 NOTE — Progress Notes (Signed)
[place] OFFICE PROGRESS NOTE   DIAGNOSIS: Stage IV (T1a, N2, M1b) non-small cell lung cancer, adenocarcinoma with negative EGFR mutation and presented with a right hilar mass, subcarinal lymphadenopathy as well as a large soft tissue mass in the posterior iliac bone and metastatic bone lesions diagnosed in August 2016.  PRIOR THERAPY: 1) Status post palliative radiotherapy to the soft tissue mass in the left iliac bone under the care of Dr. Valere Dross. 2) Systemic chemotherapy with carboplatin for AUC of 5 and Alimta 500 MG/M2 every 3 weeks. First dose expected on 02/22/2015. Status post 6 cycles. Starting from cycle #3 Alimta was reduced to 400 MG/M2 secondary to neutropenia after the first 2 cycles. 3) Maintenance systemic chemotherapy with single agent Alimta 500 MG/M2 every 3 weeks. First dose 07/19/2015. Status post 3 cycles. Last dose was given 08/30/2015 discontinued secondary to disease progression. 4) Immunotherapy treatment with Tecentriq (Atezolizumab) 1200 MG IV every 3 weeks, for his dose 10/14/2015. Status post 6 cycles, discontinued secondary to disease progression. 5) whole brain irradiation for multiple new brain metastasis expected to be completed on 02/12/2016. 6) palliative radiotherapy to the progressive bone lesion in the left hip. 7) systemic chemotherapy with docetaxel 75 MG/M2 and Cyramza 10 MG/KG every 3 weeks status post 5 cycles. Discontinued today secondary to disease progression.  CURRENT THERAPY:  1) systemic chemotherapy with gemcitabine 1000 MG/M2 on days 1 and 8 every 3 weeks. First dose 07/12/2016. 2) Xgeva 120 mg subcutaneously on monthly basis for metastatic bone disease    INTERVAL HISTORY:   Andrea [name] returns as scheduled. She completed cycle 2 gemcitabine day 1/day 8 beginning 08/02/2016. She continues to have nausea with intermittent vomiting. No diarrhea though she does have intermittent "loose stools". Last bowel movement was 2 days  ago. No fever. Stable shortness of breath. She denies any recent bleeding. She is tolerating fluids. She has some difficulty taking medications due to the nausea. She continues to have pain at the "left ilium".  Objective:  Vital signs in last 24 hours:  Blood pressure 99/62, pulse 89, temperature 98.4 F (36.9 C), temperature source Oral, resp. rate 18.    HEENT: White coating over tongue. No buccal thrush. Mucous membranes are moist. Resp: Lungs clear bilaterally. Cardio: Regular rate and rhythm. GI: No hepatomegaly. Vascular: No leg edema. Neuro: Alert and oriented.  Port-A-Cath without erythema.  Lab Results:  Lab Results  Component Value Date   WBC 4.1 08/23/2016   HGB 10.0 (L) 08/23/2016   HCT 29.1 (L) 08/23/2016   MCV 98.1 08/23/2016   PLT 549 (H) 08/23/2016   NEUTROABS 2.6 08/23/2016    Imaging:  No results found.  Medications: I have reviewed the patient's current medications.  Assessment/Plan: 1. Metastatic non-small cell lung cancer, adenocarcinoma with bone and brain metastasis. Status post several chemotherapy regimens as well as treatment with immunotherapy with disease progression. She began treatment with single agent gemcitabine on a day 1/day 8 schedule beginning 07/12/2016. She has completed 2 cycles. 2. Pain secondary to #1. She will continue the Duragesic patch. She was provided with a new prescription for Percocet at today's visit. 3. Poor appetite, persistent nausea. She recently began a trial of Marinol.   Disposition: Andrea [name] appears stable. She has completed 2 cycles of single agent gemcitabine. Plan to proceed with cycle 3 today as scheduled. She will return for the day 8 gemcitabine in one week. Dr. Julien Nordmann recommends restaging CT scans after she has completed the current cycle. She  will return for a follow-up visit with Dr. Julien Nordmann on 09/13/2016 to review the results of the CT scans. She will contact the office in the interim with any  problems.  Plan reviewed with Dr. Julien Nordmann.    Ned Card ANP/GNP-BC   08/23/2016  1:25 PM

## 2016-08-23 NOTE — Patient Instructions (Signed)
[facility] Discharge Instructions for Patients Receiving Chemotherapy  Today you received the following chemotherapy agents gemzar  To help prevent nausea and vomiting after your treatment, we encourage you to take your nausea medication as directed.  If you develop nausea and vomiting that is not controlled by your nausea medication, call the clinic.   BELOW ARE SYMPTOMS THAT SHOULD BE REPORTED IMMEDIATELY:  *FEVER GREATER THAN 100.5 F  *CHILLS WITH OR WITHOUT FEVER  NAUSEA AND VOMITING THAT IS NOT CONTROLLED WITH YOUR NAUSEA MEDICATION  *UNUSUAL SHORTNESS OF BREATH  *UNUSUAL BRUISING OR BLEEDING  TENDERNESS IN MOUTH AND THROAT WITH OR WITHOUT PRESENCE OF ULCERS  *URINARY PROBLEMS  *BOWEL PROBLEMS  UNUSUAL RASH Items with * indicate a potential emergency and should be followed up as soon as possible.  Feel free to call the clinic you have any questions or concerns. The clinic phone number is [phone].  

## 2016-08-24 ENCOUNTER — Encounter: Payer: Self-pay | Admitting: Neurology

## 2016-08-24 ENCOUNTER — Ambulatory Visit (INDEPENDENT_AMBULATORY_CARE_PROVIDER_SITE_OTHER): Payer: Medicaid Other | Admitting: Neurology

## 2016-08-24 VITALS — BP 98/58 | HR 78 | Ht 66.0 in | Wt 139.0 lb

## 2016-08-24 DIAGNOSIS — C3491 Malignant neoplasm of unspecified part of right bronchus or lung: Secondary | ICD-10-CM | POA: Diagnosis not present

## 2016-08-24 DIAGNOSIS — C7931 Secondary malignant neoplasm of brain: Secondary | ICD-10-CM

## 2016-08-24 DIAGNOSIS — R569 Unspecified convulsions: Secondary | ICD-10-CM | POA: Diagnosis not present

## 2016-08-24 MED ORDER — LEVETIRACETAM 1000 MG PO TABS: 1000.0000 mg | ORAL_TABLET | Freq: Two times a day (BID) | ORAL | 4 refills | Status: AC

## 2016-08-24 MED FILL — levETIRAcetam 1000 MG TABS: 1000 | 34 days supply | Qty: 68 | Fill #0

## 2016-08-24 NOTE — Progress Notes (Signed)
PATIENT: Andrea [name] DOB: [birth year]  Chief Complaint  Patient presents with  . Metastasis to Brain    Says her last seizure was October 2017.  She was taking Keppra '1000mg'$ , BID but has not taken it since November 2017.  She is here to determine if she needs to restart the medication.    [name] PCP    [name], MD  . Oncologist    Curt Bears, MD - referring MD     HISTORICAL  Andrea [name] is a 46 years old right-handed female, seen in refer by her primary care doctor [name] [name] for evaluation of seizure, her oncologist is Dr. Curt Bears, initial evaluation was on Aug 24 2016.  I was able to review and summarize her oncology note, she had a history of metastatic non-small cell lung cancer, adenocarcinoma with bone and brain metastasis.  She has been treated with several chemotherapy, immunotherapy, with continued disease progression. He is now undergoing treatment with systemic chemotherapy with single agent gemcitabine status post 1 cycle. She also has significant left iliac and low back pain, is using Percocet, fentanyl.  Diagnosis was made in August 2016, status post palliative radiation of the soft tissue mass in the left iliac bone, systemic chemotherapy since October 2016, status post 6 cycles, immunotherapy with Atezolizumab, first dose June 2017, status post 6 cycle, discontinued secondary to disease progression, whole brain radiation for multiple brain metastatic lesions completed on February 12 2016. Palliative radiotherapy to progressive bone lesion in the left hip.  She reported a history of seizure in October 2017, described as complex partial seizure with secondary generalization, she suddenly become confused, she could see herself falling, but could not stopping it, followed by lost consciousness, seizure activity, she had total about 5 seizures between October to November 2017, last a seizure was in November 2017, she is now on Keppra 1000 mg twice a day,  tolerating it well.   We have personally reviewed MRI of brain with and without contrast in February 2018: Multiple supratentorial metastatic lesions, left frontal, right lateral frontal, left occipital periventricular, right occipital,   She has significant left hip pain, left iliac pain, could not bearing weight for extended period of time, has to sleep lying on her right side during interview.  REVIEW OF SYSTEMS: Full 14 system review of systems performed and notable only for decreased energy, disinterested in activities, reading sauce, depression, memory loss, confusion, numbness, weakness, dizziness, passing out, loss, fatigue, ringing ears, blurred vision, joint pain, allergy  ALLERGIES: Allergies  Allergen Reactions  . Morphine And Related Rash    Patient states that if she takes more than 30 days she gets rashes.  . Hydrocodone Rash    HOME MEDICATIONS: Current Outpatient Prescriptions  Medication Sig Dispense Refill  . diclofenac sodium (VOLTAREN) 1 % GEL Apply 4 g topically 4 (four) times daily. 200 g 3  . dronabinol (MARINOL) 2.5 MG capsule Take 1 capsule (2.5 mg total) by mouth 2 (two) times daily before a meal. 60 capsule 0  . fentaNYL (DURAGESIC - DOSED MCG/HR) 75 MCG/HR Place 1 patch (75 mcg total) onto the skin every 3 (three) days. 10 patch 0  . lidocaine-prilocaine (EMLA) cream Apply 1 teaspoon to skin 1 to 2 hours before use cover to keep in contact with skin 30 g PRN  . loratadine (CLARITIN) 10 MG tablet Take 1 tablet (10 mg total) by mouth daily.    . Multiple Vitamins-Minerals (MULTIVITAMIN WITH MINERALS) tablet Take  by mouth.    . ondansetron (ZOFRAN) 4 MG tablet Take 1 tablet (4 mg total) by mouth every 8 (eight) hours as needed for nausea or vomiting. 20 tablet 0  . oxyCODONE-acetaminophen (PERCOCET) 10-325 MG tablet Take 1 tablet by mouth every 6 (six) hours as needed for pain. 60 tablet 0  . senna-docusate (SENOKOT-S) 8.6-50 MG tablet Take 2 tablets by mouth at  bedtime. 30 tablet 0  . levETIRAcetam (KEPPRA) 1000 MG tablet Take 1 tablet (1,000 mg total) by mouth 2 (two) times daily. (Patient not taking: Reported on 05/03/2016) 60 tablet 0  . potassium chloride SA (KLOR-CON M20) 20 MEQ tablet Take 2 tablets (40 mEq total) by mouth daily. 20 tablet 0   No current facility-administered medications for this visit.     PAST MEDICAL HISTORY: Past Medical History:  Diagnosis Date  . 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 Anginal pain ([place])    chest pain not sure what related to  . Anxiety   . Bone cancer ([place])    lung ca with bone mets  . Chronic back pain   . Depression   . Dysmenorrhea 12/06/2009   Qualifier: Diagnosis of  By: [name] RN, Regino Schultze    . Encounter for antineoplastic immunotherapy 10/07/2015  . Family history of adverse reaction to anesthesia    mother stopped breathing after surgery  . Goals of care, counseling/discussion 05/03/2016  . Headache    stress  . Low back pain radiating to left leg 11/29/2010  . Non-small cell carcinoma of right lung, stage 4 ([place]) 01/01/2015  . S/P radiation therapy 01/01/2015 through 01/21/2015     Left iliac bone 3500 cGy in 14 sessions   . Seizure ([place])   . Shortness of breath dyspnea    with exertion, Pt denies 02/2015    PAST SURGICAL HISTORY: Past Surgical History:  Procedure Laterality Date  . laproscopy surgery to check her tubes    . LUMBAR LAMINECTOMY/DECOMPRESSION MICRODISCECTOMY Left 05/01/2014   Procedure: Microdiscectomy - L5-S1 - left;  Surgeon: Eustace Moore, MD;  Location: [facility];  Service: Neurosurgery;  Laterality: Left;    FAMILY HISTORY: Family History  Problem Relation Age of Onset  . Diabetes Mother   . Hypertension Mother   . Hypertension Father   . Heart attack Maternal Grandfather   . Hypertension Maternal Grandfather   . Heart attack Paternal Grandfather   . Hypertension Paternal Grandfather   . Breast  cancer Maternal Aunt   . Breast cancer Maternal Aunt     SOCIAL HISTORY:  Social History   Social History  . Marital status: Single    Spouse name: N/A  . Number of children: 0  . Years of education: Associates   Occupational History  . Unemployed    Social History Main Topics  . Smoking status: Former Smoker    Packs/day: 0.50    Years: 25.00    Types: Cigarettes, E-cigarettes    Quit date: 12/29/2014  . Smokeless tobacco: Never Used     Comment: Smoking very little.  . Alcohol use 0.0 oz/week     Comment: Rarely  . Drug use: No     Comment: previoulsly.  [name] Sexual activity: Not Currently   Other Topics Concern  . Not on file   Social History Narrative   Lives at home alone.   Right-handed.   No caffeine use.     PHYSICAL EXAM   Vitals:   08/24/16 1431  Weight: 139 lb (63  kg)  Height: '5\' 6"'$  (1.676 m)    Not recorded      Body mass index is 22.44 kg/m.  PHYSICAL EXAMNIATION:  Gen: NAD, conversant, well nourised, obese, well groomed                     Cardiovascular: Regular rate rhythm, no peripheral edema, warm, nontender. Eyes: Conjunctivae clear without exudates or hemorrhage Neck: Supple, no carotid bruits. Pulmonary: Clear to auscultation bilaterally   NEUROLOGICAL EXAM:  MENTAL STATUS: Speech:    Speech is normal; fluent and spontaneous with normal comprehension.  Cognition:     Orientation to time, place and person     Normal recent and remote memory     Normal Attention span and concentration     Normal Language, naming, repeating,spontaneous speech     Fund of knowledge   CRANIAL NERVES: CN II: Visual fields are full to confrontation. Fundoscopic exam is normal with sharp discs and no vascular changes. Pupils are round equal and briskly reactive to light. CN III, IV, VI: extraocular movement are normal. No ptosis. CN V: Facial sensation is intact to pinprick in all 3 divisions bilaterally. Corneal responses are intact.  CN VII:  Face is symmetric with normal eye closure and smile. CN VIII: Hearing is normal to rubbing fingers CN IX, X: Palate elevates symmetrically. Phonation is normal. CN XI: Head turning and shoulder shrug are intact CN XII: Tongue is midline with normal movements and no atrophy.  MOTOR: Limited due to left hip and iliac pain, but felt there was no significant bilateral upper or lower extremity proximal and distal weakness.  REFLEXES: Reflexes are 2+ and symmetric at the biceps, triceps, knees, and ankles. Plantar responses are flexor.  SENSORY: Intact to light touch, pinprick, positional sensation and vibratory sensation are intact in fingers and toes.  COORDINATION: Rapid alternating movements and fine finger movements are intact. There is no dysmetria on finger-to-nose and heel-knee-shin.    GAIT/STANCE: Posture is normal. Gait is steady with normal steps, base, arm swing, and turning. Heel and toe walking are normal. Tandem gait is normal.  Romberg is absent.   DIAGNOSTIC DATA (LABS, IMAGING, TESTING) - I reviewed patient records, labs, notes, testing and imaging myself where available.   ASSESSMENT AND PLAN  Andrea [name] is a 46 y.o. 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 Ph.D.  [facility] [address], [place], [place] [address] Ph: [phone] Fax: [phone]  CC: [name], MD, Curt Bears, MD

## 2016-08-29 ENCOUNTER — Other Ambulatory Visit: Payer: Self-pay | Admitting: Medical Oncology

## 2016-08-29 DIAGNOSIS — C3491 Malignant neoplasm of unspecified part of right bronchus or lung: Secondary | ICD-10-CM

## 2016-08-30 ENCOUNTER — Other Ambulatory Visit (HOSPITAL_BASED_OUTPATIENT_CLINIC_OR_DEPARTMENT_OTHER): Payer: Medicaid Other

## 2016-08-30 ENCOUNTER — Ambulatory Visit (HOSPITAL_BASED_OUTPATIENT_CLINIC_OR_DEPARTMENT_OTHER): Payer: Medicaid Other

## 2016-08-30 VITALS — BP 94/62 | HR 89 | Temp 98.3°F | Resp 17

## 2016-08-30 DIAGNOSIS — C3491 Malignant neoplasm of unspecified part of right bronchus or lung: Secondary | ICD-10-CM

## 2016-08-30 DIAGNOSIS — Z5111 Encounter for antineoplastic chemotherapy: Secondary | ICD-10-CM | POA: Diagnosis not present

## 2016-08-30 DIAGNOSIS — C7931 Secondary malignant neoplasm of brain: Secondary | ICD-10-CM | POA: Diagnosis not present

## 2016-08-30 DIAGNOSIS — C7951 Secondary malignant neoplasm of bone: Secondary | ICD-10-CM

## 2016-08-30 LAB — COMPREHENSIVE METABOLIC PANEL
ALT: 12 U/L (ref 0–55)
ANION GAP: 11 meq/L (ref 3–11)
AST: 23 U/L (ref 5–34)
Albumin: 2.6 g/dL — ABNORMAL LOW (ref 3.5–5.0)
Alkaline Phosphatase: 76 U/L (ref 40–150)
BUN: 3 mg/dL — ABNORMAL LOW (ref 7.0–26.0)
CALCIUM: 8 mg/dL — AB (ref 8.4–10.4)
CHLORIDE: 104 meq/L (ref 98–109)
CO2: 21 mEq/L — ABNORMAL LOW (ref 22–29)
Creatinine: 0.7 mg/dL (ref 0.6–1.1)
EGFR: >90 mL/min/{1.73_m2} (ref >90)
Glucose: 103 mg/dl (ref 70–140)
POTASSIUM: 3 meq/L — AB (ref 3.5–5.1)
Sodium: 136 mEq/L (ref 136–145)
Total Bilirubin: 0.27 mg/dL (ref 0.20–1.20)
Total Protein: 6.8 g/dL (ref 6.4–8.3)

## 2016-08-30 LAB — CBC WITH DIFFERENTIAL/PLATELET
BASO%: 1.1 % (ref 0.0–2.0)
BASOS ABS: 0 10*3/uL (ref 0.0–0.1)
EOS%: 0.3 % (ref 0.0–7.0)
Eosinophils Absolute: 0 10*3/uL (ref 0.0–0.5)
HEMATOCRIT: 25.6 % — AB (ref 34.8–46.6)
HGB: 8.7 g/dL — ABNORMAL LOW (ref 11.6–15.9)
LYMPH#: 0.5 10*3/uL — AB (ref 0.9–3.3)
LYMPH%: 23.4 % (ref 14.0–49.7)
MCH: 33.3 pg (ref 25.1–34.0)
MCHC: 34.2 g/dL (ref 31.5–36.0)
MCV: 97.6 fL (ref 79.5–101.0)
MONO#: 0.5 10*3/uL (ref 0.1–0.9)
MONO%: 20.6 % — ABNORMAL HIGH (ref 0.0–14.0)
NEUT#: 1.3 10*3/uL — ABNORMAL LOW (ref 1.5–6.5)
NEUT%: 54.6 % (ref 38.4–76.8)
PLATELETS: 519 10*3/uL — AB (ref 145–400)
RBC: 2.62 10*6/uL — ABNORMAL LOW (ref 3.70–5.45)
RDW: 18.3 % — ABNORMAL HIGH (ref 11.2–14.5)
WBC: 2.3 10*3/uL — ABNORMAL LOW (ref 3.9–10.3)

## 2016-08-30 MED ORDER — POTASSIUM CHLORIDE 10 MEQ/100ML IV SOLN
10.0000 meq | INTRAVENOUS | Status: AC
  Administered 2016-08-30 (×2): 10 meq via INTRAVENOUS
  Filled 2016-08-30: qty 100

## 2016-08-30 MED ORDER — SODIUM CHLORIDE 0.9 % IV SOLN
1000.0000 mg/m2 | Freq: Once | INTRAVENOUS | Status: AC
  Administered 2016-08-30: 1748 mg via INTRAVENOUS
  Filled 2016-08-30: qty 45.97

## 2016-08-30 MED ORDER — SODIUM CHLORIDE 0.9% FLUSH
10.0000 mL | INTRAVENOUS | Status: DC | PRN
  Administered 2016-08-30: 10 mL
  Filled 2016-08-30: qty 10

## 2016-08-30 MED ORDER — PROCHLORPERAZINE MALEATE 10 MG PO TABS
ORAL_TABLET | ORAL | Status: AC
  Filled 2016-08-30: qty 1

## 2016-08-30 MED ORDER — PROCHLORPERAZINE MALEATE 10 MG PO TABS
10.0000 mg | ORAL_TABLET | Freq: Once | ORAL | Status: AC
  Administered 2016-08-30: 10 mg via ORAL

## 2016-08-30 MED ORDER — HEPARIN SOD (PORK) LOCK FLUSH 100 UNIT/ML IV SOLN
500.0000 [IU] | Freq: Once | INTRAVENOUS | Status: AC | PRN
  Administered 2016-08-30: 500 [IU]
  Filled 2016-08-30: qty 5

## 2016-08-30 MED ORDER — SODIUM CHLORIDE 0.9 % IV SOLN
Freq: Once | INTRAVENOUS | Status: AC
  Administered 2016-08-30: 10:00:00 via INTRAVENOUS

## 2016-08-30 NOTE — Progress Notes (Signed)
Per MD ok to treat with K+ of 3.0 and ANC of 1.3.   Wylene Simmer, BSN, RN 08/30/2016 10:00 AM

## 2016-08-30 NOTE — Patient Instructions (Signed)
[facility] Discharge Instructions for Patients Receiving Chemotherapy  Today you received the following chemotherapy agents: Gemcitabine   To help prevent nausea and vomiting after your treatment, we encourage you to take your nausea medication as prescribed.   If you develop nausea and vomiting that is not controlled by your nausea medication, call the clinic.   BELOW ARE SYMPTOMS THAT SHOULD BE REPORTED IMMEDIATELY:  *FEVER GREATER THAN 100.5 F  *CHILLS WITH OR WITHOUT FEVER  NAUSEA AND VOMITING THAT IS NOT CONTROLLED WITH YOUR NAUSEA MEDICATION  *UNUSUAL SHORTNESS OF BREATH  *UNUSUAL BRUISING OR BLEEDING  TENDERNESS IN MOUTH AND THROAT WITH OR WITHOUT PRESENCE OF ULCERS  *URINARY PROBLEMS  *BOWEL PROBLEMS  UNUSUAL RASH Items with * indicate a potential emergency and should be followed up as soon as possible.  Feel free to call the clinic you have any questions or concerns. The clinic phone number is [phone].  Please show the [facility] at check-in to the Emergency Department and triage nurse.    Hypokalemia Hypokalemia means that the amount of potassium in the blood is lower than normal.Potassium is a chemical that helps regulate the amount of fluid in the body (electrolyte). It also stimulates muscle tightening (contraction) and helps nerves work properly.Normally, most of the body's potassium is inside of cells, and only a very small amount is in the blood. Because the amount in the blood is so small, minor changes to potassium levels in the blood can be life-threatening. What are the causes? This condition may be caused by:  Antibiotic medicine.  Diarrhea or vomiting. Taking too much of a medicine that helps you have a bowel movement (laxative) can cause diarrhea and lead to hypokalemia.  Chronic kidney disease (CKD).  Medicines that help the body get rid of excess fluid (diuretics).  Eating disorders, such as bulimia.  Low  magnesium levels in the body.  Sweating a lot. What are the signs or symptoms? Symptoms of this condition include:  Weakness.  Constipation.  Fatigue.  Muscle cramps.  Mental confusion.  Skipped heartbeats or irregular heartbeat (palpitations).  Tingling or numbness. How is this diagnosed? This condition is diagnosed with a blood test. How is this treated? Hypokalemia can be treated by taking potassium supplements by mouth or adjusting the medicines that you take. Treatment may also include eating more foods that contain a lot of potassium. If your potassium level is very low, you may need to get potassium through an IV tube in one of your veins and be monitored in the hospital. Follow these instructions at home:  Take over-the-counter and prescription medicines only as told by your health care provider. This includes vitamins and supplements.  Eat a healthy diet. A healthy diet includes fresh fruits and vegetables, whole grains, healthy fats, and lean proteins.  If instructed, eat more foods that contain a lot of potassium, such as:  Nuts, such as peanuts and pistachios.  Seeds, such as sunflower seeds and pumpkin seeds.  Peas, lentils, and lima beans.  Whole grain and bran cereals and breads.  Fresh fruits and vegetables, such as apricots, avocado, bananas, cantaloupe, kiwi, oranges, tomatoes, asparagus, and potatoes.  Orange juice.  Tomato juice.  Red meats.  Yogurt.  Keep all follow-up visits as told by your health care provider. This is important. Contact a health care provider if:  You have weakness that gets worse.  You feel your heart pounding or racing.  You vomit.  You have diarrhea.  You  have diabetes (diabetes mellitus) and you have trouble keeping your blood sugar (glucose) in your target range. Get help right away if:  You have chest pain.  You have shortness of breath.  You have vomiting or diarrhea that lasts for more than 2  days.  You faint. This information is not intended to replace advice given to you by your health care provider. Make sure you discuss any questions you have with your health care provider. Document Released: 04/10/2005 Document Revised: 11/27/2015 Document Reviewed: 11/27/2015 Elsevier Interactive Patient Education  2017 [employer].

## 2016-09-04 ENCOUNTER — Telehealth: Payer: Self-pay

## 2016-09-04 NOTE — Telephone Encounter (Signed)
Pt called for her fentanyl patches and percocet refill.

## 2016-09-05 ENCOUNTER — Other Ambulatory Visit: Payer: Self-pay | Admitting: Medical Oncology

## 2016-09-05 ENCOUNTER — Telehealth: Payer: Self-pay | Admitting: Medical Oncology

## 2016-09-05 DIAGNOSIS — C3491 Malignant neoplasm of unspecified part of right bronchus or lung: Secondary | ICD-10-CM

## 2016-09-05 DIAGNOSIS — C7951 Secondary malignant neoplasm of bone: Secondary | ICD-10-CM

## 2016-09-05 DIAGNOSIS — C3490 Malignant neoplasm of unspecified part of unspecified bronchus or lung: Secondary | ICD-10-CM

## 2016-09-05 MED ORDER — FENTANYL 75 MCG/HR TD PT72: 75.0000 ug | MEDICATED_PATCH | TRANSDERMAL | 0 refills | Status: DC

## 2016-09-05 MED ORDER — OXYCODONE-ACETAMINOPHEN 10-325 MG PO TABS: 1.0000 | ORAL_TABLET | Freq: Four times a day (QID) | ORAL | 0 refills | Status: DC | PRN

## 2016-09-05 MED FILL — OXYCODONE-ACETAMINOPHEN 10-: 10-325 | 15 days supply | Qty: 60 | Fill #0

## 2016-09-05 MED FILL — fentaNYL 75 MCG/HR PT72: 75 | 30 days supply | Qty: 10 | Fill #0

## 2016-09-05 NOTE — Telephone Encounter (Signed)
Message left to pick up rx.

## 2016-09-06 ENCOUNTER — Ambulatory Visit: Payer: Medicaid Other | Admitting: Internal Medicine

## 2016-09-06 ENCOUNTER — Other Ambulatory Visit: Payer: Medicaid Other

## 2016-09-06 ENCOUNTER — Ambulatory Visit: Payer: Medicaid Other

## 2016-09-13 ENCOUNTER — Ambulatory Visit (HOSPITAL_BASED_OUTPATIENT_CLINIC_OR_DEPARTMENT_OTHER): Payer: Medicaid Other | Admitting: Internal Medicine

## 2016-09-13 ENCOUNTER — Other Ambulatory Visit (HOSPITAL_BASED_OUTPATIENT_CLINIC_OR_DEPARTMENT_OTHER): Payer: Medicaid Other

## 2016-09-13 ENCOUNTER — Encounter: Payer: Self-pay | Admitting: Internal Medicine

## 2016-09-13 ENCOUNTER — Ambulatory Visit (HOSPITAL_BASED_OUTPATIENT_CLINIC_OR_DEPARTMENT_OTHER): Payer: Medicaid Other

## 2016-09-13 VITALS — BP 104/72 | HR 88 | Temp 98.1°F | Resp 18 | Wt 136.4 lb

## 2016-09-13 DIAGNOSIS — F329 Major depressive disorder, single episode, unspecified: Secondary | ICD-10-CM

## 2016-09-13 DIAGNOSIS — C3491 Malignant neoplasm of unspecified part of right bronchus or lung: Secondary | ICD-10-CM

## 2016-09-13 DIAGNOSIS — Z452 Encounter for adjustment and management of vascular access device: Secondary | ICD-10-CM

## 2016-09-13 DIAGNOSIS — Z5111 Encounter for antineoplastic chemotherapy: Secondary | ICD-10-CM | POA: Diagnosis present

## 2016-09-13 DIAGNOSIS — C7931 Secondary malignant neoplasm of brain: Secondary | ICD-10-CM

## 2016-09-13 DIAGNOSIS — C7951 Secondary malignant neoplasm of bone: Secondary | ICD-10-CM

## 2016-09-13 DIAGNOSIS — C3490 Malignant neoplasm of unspecified part of unspecified bronchus or lung: Secondary | ICD-10-CM

## 2016-09-13 DIAGNOSIS — Z95828 Presence of other vascular implants and grafts: Secondary | ICD-10-CM

## 2016-09-13 LAB — CBC WITH DIFFERENTIAL/PLATELET
BASO%: 0 % (ref 0.0–2.0)
Basophils Absolute: 0 10*3/uL (ref 0.0–0.1)
EOS ABS: 0.1 10*3/uL (ref 0.0–0.5)
EOS%: 1.6 % (ref 0.0–7.0)
HCT: 27.6 % — ABNORMAL LOW (ref 34.8–46.6)
HGB: 8.6 g/dL — ABNORMAL LOW (ref 11.6–15.9)
LYMPH%: 16.9 % (ref 14.0–49.7)
MCH: 31.5 pg (ref 25.1–34.0)
MCHC: 31.2 g/dL — AB (ref 31.5–36.0)
MCV: 101.1 fL — ABNORMAL HIGH (ref 79.5–101.0)
MONO#: 0.8 10*3/uL (ref 0.1–0.9)
MONO%: 19.2 % — AB (ref 0.0–14.0)
NEUT#: 2.7 10*3/uL (ref 1.5–6.5)
NEUT%: 62.3 % (ref 38.4–76.8)
PLATELETS: 379 10*3/uL (ref 145–400)
RBC: 2.73 10*6/uL — AB (ref 3.70–5.45)
RDW: 19.9 % — ABNORMAL HIGH (ref 11.2–14.5)
WBC: 4.3 10*3/uL (ref 3.9–10.3)
lymph#: 0.7 10*3/uL — ABNORMAL LOW (ref 0.9–3.3)

## 2016-09-13 LAB — COMPREHENSIVE METABOLIC PANEL
ALT: 8 U/L (ref 0–55)
AST: 13 U/L (ref 5–34)
Albumin: 2.7 g/dL — ABNORMAL LOW (ref 3.5–5.0)
Alkaline Phosphatase: 76 U/L (ref 40–150)
Anion Gap: 6 mEq/L (ref 3–11)
BILIRUBIN TOTAL: 0.22 mg/dL (ref 0.20–1.20)
BUN: 3.6 mg/dL — ABNORMAL LOW (ref 7.0–26.0)
CO2: 23 mEq/L (ref 22–29)
Calcium: 9.3 mg/dL (ref 8.4–10.4)
Chloride: 108 mEq/L (ref 98–109)
Creatinine: 0.7 mg/dL (ref 0.6–1.1)
EGFR: >90 mL/min/{1.73_m2} (ref >90)
Glucose: 88 mg/dl (ref 70–140)
POTASSIUM: 4.8 meq/L (ref 3.5–5.1)
SODIUM: 137 meq/L (ref 136–145)
Total Protein: 6.2 g/dL — ABNORMAL LOW (ref 6.4–8.3)

## 2016-09-13 MED ORDER — HEPARIN SOD (PORK) LOCK FLUSH 100 UNIT/ML IV SOLN
500.0000 [IU] | Freq: Once | INTRAVENOUS | Status: AC | PRN
  Administered 2016-09-13: 500 [IU]
  Filled 2016-09-13: qty 5

## 2016-09-13 MED ORDER — LIDOCAINE-PRILOCAINE 2.5-2.5 % EX CREA: TOPICAL_CREAM | CUTANEOUS | 99 refills | Status: DC

## 2016-09-13 MED ORDER — MIRTAZAPINE 30 MG PO TABS: 30.0000 mg | ORAL_TABLET | Freq: Every day | ORAL | 1 refills | Status: DC

## 2016-09-13 MED ORDER — DRONABINOL 2.5 MG PO CAPS
2.5000 mg | ORAL_CAPSULE | Freq: Two times a day (BID) | ORAL | 0 refills | Status: DC
Start: 2016-09-13 — End: 2016-10-17

## 2016-09-13 MED ORDER — PROCHLORPERAZINE MALEATE 10 MG PO TABS
10.0000 mg | ORAL_TABLET | Freq: Once | ORAL | Status: AC
Start: 2016-09-13 — End: 2016-09-13
  Administered 2016-09-13: 10 mg via ORAL

## 2016-09-13 MED ORDER — SODIUM CHLORIDE 0.9 % IV SOLN
Freq: Once | INTRAVENOUS | Status: AC
  Administered 2016-09-13: 10:00:00 via INTRAVENOUS

## 2016-09-13 MED ORDER — DENOSUMAB 120 MG/1.7ML ~~LOC~~ SOLN
120.0000 mg | Freq: Once | SUBCUTANEOUS | Status: AC
  Administered 2016-09-13: 120 mg via SUBCUTANEOUS
  Filled 2016-09-13: qty 1.7

## 2016-09-13 MED ORDER — SODIUM CHLORIDE 0.9 % IV SOLN
1000.0000 mg/m2 | Freq: Once | INTRAVENOUS | Status: AC
  Administered 2016-09-13: 1748 mg via INTRAVENOUS
  Filled 2016-09-13: qty 45.97

## 2016-09-13 MED ORDER — SODIUM CHLORIDE 0.9% FLUSH
10.0000 mL | INTRAVENOUS | Status: DC | PRN
  Administered 2016-09-13: 10 mL
  Filled 2016-09-13: qty 10

## 2016-09-13 MED ORDER — PROCHLORPERAZINE MALEATE 10 MG PO TABS
ORAL_TABLET | ORAL | Status: AC
  Filled 2016-09-13: qty 1

## 2016-09-13 MED ORDER — ALTEPLASE 2 MG IJ SOLR
2.0000 mg | Freq: Once | INTRAMUSCULAR | Status: AC | PRN
  Administered 2016-09-13: 2 mg
  Filled 2016-09-13: qty 2

## 2016-09-13 MED FILL — LIDOCAINE-PRILOCAINE CREAM: 2.5-2.5 | 15 days supply | Qty: 30 | Fill #0

## 2016-09-13 MED FILL — MIRTAZAPINE 30 MG TABLET: 30 | 30 days supply | Qty: 30 | Fill #0

## 2016-09-13 NOTE — Progress Notes (Signed)
1209 - At end of visit, patient was coughing up bloody sputum. She stated that it had happened once before "about 3 weeks ago once home after treatment" and that it lasted for "a few hours". Reported it to MD and he recommended that she watch it and report any worsening of symptoms. Patient voiced understanding.  Wylene Simmer, BSN, RN 09/13/2016

## 2016-09-13 NOTE — Progress Notes (Signed)
[facility] [phone]   [phone]  OFFICE PROGRESS NOTE  Andrea Nims, MD [place] [address]  DIAGNOSIS: Stage IV (T1a, N2, M1b) non-small cell lung cancer, adenocarcinoma with negative EGFR mutation and presented with a right hilar mass, subcarinal lymphadenopathy as well as a large soft tissue mass in the posterior iliac bone and metastatic bone lesions diagnosed in August 2016.  PRIOR THERAPY: 1) Status post palliative radiotherapy to the soft tissue mass in the left iliac bone under the care of Dr. Valere Dross. 2) Systemic chemotherapy with carboplatin for AUC of 5 and Alimta 500 MG/M2 every 3 weeks. First dose expected on 02/22/2015. Status post 6 cycles. Starting from cycle #3 Alimta was reduced to 400 MG/M2 secondary to neutropenia after the first 2 cycles. 3) Maintenance systemic chemotherapy with single agent Alimta 500 MG/M2 every 3 weeks. First dose 07/19/2015. Status post 3 cycles. Last dose was given 08/30/2015 discontinued secondary to disease progression. 4)  Immunotherapy treatment with Tecentriq (Atezolizumab) 1200 MG IV every 3 weeks, for his dose 10/14/2015. Status post 6 cycles, discontinued secondary to disease progression. 5) whole brain irradiation for multiple new brain metastasis expected to be completed on 02/12/2016. 6) palliative radiotherapy to the progressive bone lesion in the left hip. 7) systemic chemotherapy with docetaxel 75 MG/M2 and Cyramza 10 MG/KG every 3 weeks status post 5 cycles. Discontinued today secondary to disease progression.  CURRENT THERAPY:  1) systemic chemotherapy with gemcitabine 1000 MG/M2 on days 1 and 8 every 3 weeks. First dose 07/12/2016. Status post 3 cycles. 2) Xgeva 120 mg subcutaneously on monthly basis for metastatic bone disease  INTERVAL HISTORY: Andrea [name] 46 y.o. female returns to the clinic today for follow-up visit. The patient is currently undergoing treatment  with systemic chemotherapy with single agent gemcitabine on days 1 and 8 every 3 weeks. She status post 3 cycles. She was supposed to have repeat CT scan of the chest before this cycle but unfortunately she did not receive a call from the cancer center or radiology to scheduled her scan. She is feeling better today except for the persistent pain on the right hip area which is much better compared to several weeks ago. She denied having any current nausea or vomiting. She has no chest pain, shortness of breath, cough or hemoptysis. She denied having any fever or chills. She is here today for evaluation before starting cycle #4.  MEDICAL HISTORY: Past Medical History:  Diagnosis Date  . 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 Anginal pain ([place])    chest pain not sure what related to  . Anxiety   . Bone cancer ([place])    lung ca with bone mets  . Chronic back pain   . Depression   . Dysmenorrhea 12/06/2009   Qualifier: Diagnosis of  By: [name] RN, Regino Schultze    . Encounter for antineoplastic immunotherapy 10/07/2015  . Family history of adverse reaction to anesthesia    mother stopped breathing after surgery  . Goals of care, counseling/discussion 05/03/2016  . Headache    stress  . Low back pain radiating to left leg 11/29/2010  . Non-small cell carcinoma of right lung, stage 4 ([place]) 01/01/2015  . S/P radiation therapy 01/01/2015 through 01/21/2015     Left iliac bone 3500 cGy in 14 sessions   . Seizure ([place])   . Shortness of breath dyspnea    with exertion, Pt denies 02/2015  ALLERGIES:  is allergic to morphine and related and hydrocodone.  MEDICATIONS:  Current Outpatient Prescriptions  Medication Sig Dispense Refill  . diclofenac sodium (VOLTAREN) 1 % GEL Apply 4 g topically 4 (four) times daily. 200 g 3  . dronabinol (MARINOL) 2.5 MG capsule Take 1 capsule (2.5 mg total) by mouth 2 (two) times daily before a meal. 60  capsule 0  . fentaNYL (DURAGESIC - DOSED MCG/HR) 75 MCG/HR Place 1 patch (75 mcg total) onto the skin every 3 (three) days. 10 patch 0  . levETIRAcetam (KEPPRA) 1000 MG tablet Take 1 tablet (1,000 mg total) by mouth 2 (two) times daily. 180 tablet 4  . lidocaine-prilocaine (EMLA) cream Apply 1 teaspoon to skin 1 to 2 hours before use cover to keep in contact with skin 30 g PRN  . loratadine (CLARITIN) 10 MG tablet Take 1 tablet (10 mg total) by mouth daily.    . Multiple Vitamins-Minerals (MULTIVITAMIN WITH MINERALS) tablet Take by mouth.    . ondansetron (ZOFRAN) 4 MG tablet Take 1 tablet (4 mg total) by mouth every 8 (eight) hours as needed for nausea or vomiting. 20 tablet 0  . oxyCODONE-acetaminophen (PERCOCET) 10-325 MG tablet Take 1 tablet by mouth every 6 (six) hours as needed for pain. 60 tablet 0  . senna-docusate (SENOKOT-S) 8.6-50 MG tablet Take 2 tablets by mouth at bedtime. 30 tablet 0  . potassium chloride SA (KLOR-CON M20) 20 MEQ tablet Take 2 tablets (40 mEq total) by mouth daily. 20 tablet 0   No current facility-administered medications for this visit.     SURGICAL HISTORY:  Past Surgical History:  Procedure Laterality Date  . laproscopy surgery to check her tubes    . LUMBAR LAMINECTOMY/DECOMPRESSION MICRODISCECTOMY Left 05/01/2014   Procedure: Microdiscectomy - L5-S1 - left;  Surgeon: Eustace Moore, MD;  Location: [facility];  Service: Neurosurgery;  Laterality: Left;    REVIEW OF SYSTEMS:  Constitutional: positive for fatigue Eyes: negative Ears, nose, mouth, throat, and face: negative Respiratory: negative Cardiovascular: negative Gastrointestinal: negative Genitourinary:negative Integument/breast: negative Hematologic/lymphatic: negative Musculoskeletal:positive for bone pain Neurological: negative Behavioral/Psych: negative Endocrine: negative Allergic/Immunologic: negative   PHYSICAL EXAMINATION: General appearance: alert, cooperative, fatigued and no  distress Head: Normocephalic, without obvious abnormality, atraumatic Neck: no adenopathy, no JVD, supple, symmetrical, trachea midline and thyroid not enlarged, symmetric, no tenderness/mass/nodules Lymph nodes: Cervical, supraclavicular, and axillary nodes normal. Resp: clear to auscultation bilaterally Back: symmetric, no curvature. ROM normal. No CVA tenderness. Cardio: regular rate and rhythm, S1, S2 normal, no murmur, click, rub or gallop GI: soft, non-tender; bowel sounds normal; no masses,  no organomegaly Extremities: extremities normal, atraumatic, no cyanosis or edema Neurologic: Alert and oriented X 3, normal strength and tone. Normal symmetric reflexes. Normal coordination and gait  ECOG PERFORMANCE STATUS: 1 - Symptomatic but completely ambulatory  There were no vitals taken for this visit.  LABORATORY DATA: Lab Results  Component Value Date   WBC 2.3 (L) 08/30/2016   HGB 8.7 (L) 08/30/2016   HCT 25.6 (L) 08/30/2016   MCV 97.6 08/30/2016   PLT 519 (H) 08/30/2016      Chemistry      Component Value Date/Time   NA 136 08/30/2016 0855   K 3.0 (LL) 08/30/2016 0855   CL 108 (H) 05/22/2016 1546   CO2 21 (L) 08/30/2016 0855   BUN 3.0 (L) 08/30/2016 0855   CREATININE 0.7 08/30/2016 0855      Component Value Date/Time   CALCIUM 8.0 (L) 08/30/2016 2536  ALKPHOS 76 08/30/2016 0855   AST 23 08/30/2016 0855   ALT 12 08/30/2016 0855   BILITOT 0.27 08/30/2016 0855       RADIOGRAPHIC STUDIES: No results found.  ASSESSMENT AND PLAN:  This is a very pleasant 46 years old African-American female with metastatic non-small cell lung cancer, adenocarcinoma with bone and brain metastasis.  She underwent several systemic chemotherapy regimens as well as immunotherapy but unfortunately she continues to have evidence for disease progression. The patient was started recently on systemic chemotherapy with single agent gemcitabine status post 3 cycles. She was supposed to have  repeat CT scan of the chest, abdomen pelvis for restaging of her disease before this visit but unfortunately this was not scheduled as ordered. I will try to arrange for her scan to be performed in the next few days. She will proceed with cycle #4 today as scheduled. For pain management she will continue on her treatment with fentanyl patch and Percocet. For the lack of appetite I gave her refill of Marinol 2.5 mg by mouth twice a day. For depression, I started the patient on Remeron 30 mg by mouth daily at bedtime. I will see her back for follow-up visit in 3 weeks for evaluation before starting cycle #5. The patient was advised to call immediately if she has any concerning symptoms in the interval. The patient voices understanding of current disease status and treatment options and is in agreement with the current care plan. All questions were answered. The patient knows to call the clinic with any problems, questions or concerns. We can certainly see the patient much sooner if necessary.  Disclaimer: This note was dictated with voice recognition software. Similar sounding words can inadvertently be transcribed and may not be corrected upon review.

## 2016-09-13 NOTE — Progress Notes (Signed)
0921 cath flo administered to [facility], pt wants to wait to draw labs from her port.

## 2016-09-13 NOTE — Patient Instructions (Addendum)
[facility] [facility] Discharge Instructions for Patients Receiving Chemotherapy  Today you received the following chemotherapy agents: Gemcitabine   To help prevent nausea and vomiting after your treatment, we encourage you to take your nausea medication as prescribed.   If you develop nausea and vomiting that is not controlled by your nausea medication, call the clinic.   BELOW ARE SYMPTOMS THAT SHOULD BE REPORTED IMMEDIATELY:  *FEVER GREATER THAN 100.5 F  *CHILLS WITH OR WITHOUT FEVER  NAUSEA AND VOMITING THAT IS NOT CONTROLLED WITH YOUR NAUSEA MEDICATION  *UNUSUAL SHORTNESS OF BREATH  *UNUSUAL BRUISING OR BLEEDING  TENDERNESS IN MOUTH AND THROAT WITH OR WITHOUT PRESENCE OF ULCERS  *URINARY PROBLEMS  *BOWEL PROBLEMS  UNUSUAL RASH Items with * indicate a potential emergency and should be followed up as soon as possible.  Feel free to call the clinic you have any questions or concerns. The clinic phone number is [phone].  Please show the [place] at check-in to the Emergency Department and triage nurse.

## 2016-09-15 ENCOUNTER — Other Ambulatory Visit: Payer: Self-pay | Admitting: Internal Medicine

## 2016-09-20 ENCOUNTER — Other Ambulatory Visit: Payer: Self-pay | Admitting: Medical Oncology

## 2016-09-20 ENCOUNTER — Ambulatory Visit (HOSPITAL_BASED_OUTPATIENT_CLINIC_OR_DEPARTMENT_OTHER): Payer: Medicaid Other

## 2016-09-20 ENCOUNTER — Other Ambulatory Visit: Payer: Self-pay | Admitting: *Deleted

## 2016-09-20 ENCOUNTER — Ambulatory Visit (HOSPITAL_COMMUNITY)
Admission: RE | Admit: 2016-09-20 | Discharge: 2016-09-20 | Disposition: A | Discharge status: Home / Self Care | Payer: Medicaid Other | Source: Ambulatory Visit | Attending: Oncology | Admitting: Oncology

## 2016-09-20 DIAGNOSIS — C3490 Malignant neoplasm of unspecified part of unspecified bronchus or lung: Secondary | ICD-10-CM

## 2016-09-20 DIAGNOSIS — D702 Other drug-induced agranulocytosis: Secondary | ICD-10-CM

## 2016-09-20 DIAGNOSIS — D649 Anemia, unspecified: Secondary | ICD-10-CM | POA: Diagnosis not present

## 2016-09-20 DIAGNOSIS — D701 Agranulocytosis secondary to cancer chemotherapy: Secondary | ICD-10-CM | POA: Diagnosis present

## 2016-09-20 DIAGNOSIS — C7951 Secondary malignant neoplasm of bone: Secondary | ICD-10-CM

## 2016-09-20 DIAGNOSIS — C3491 Malignant neoplasm of unspecified part of right bronchus or lung: Secondary | ICD-10-CM

## 2016-09-20 DIAGNOSIS — R112 Nausea with vomiting, unspecified: Secondary | ICD-10-CM

## 2016-09-20 LAB — CBC WITH DIFFERENTIAL/PLATELET
BASO%: 2.5 % — AB (ref 0.0–2.0)
Basophils Absolute: 0 10*3/uL (ref 0.0–0.1)
EOS%: 1.3 % (ref 0.0–7.0)
Eosinophils Absolute: 0 10*3/uL (ref 0.0–0.5)
HCT: 24.3 % — ABNORMAL LOW (ref 34.8–46.6)
HGB: 7.7 g/dL — ABNORMAL LOW (ref 11.6–15.9)
LYMPH%: 39.2 % (ref 14.0–49.7)
MCH: 31.6 pg (ref 25.1–34.0)
MCHC: 31.7 g/dL (ref 31.5–36.0)
MCV: 99.6 fL (ref 79.5–101.0)
MONO#: 0.5 10*3/uL (ref 0.1–0.9)
MONO%: 32.3 % — AB (ref 0.0–14.0)
NEUT%: 24.7 % — ABNORMAL LOW (ref 38.4–76.8)
NEUTROS ABS: 0.4 10*3/uL — AB (ref 1.5–6.5)
NRBC: 0 % (ref 0–0)
Platelets: 339 10*3/uL (ref 145–400)
RBC: 2.44 10*6/uL — AB (ref 3.70–5.45)
RDW: 19.4 % — AB (ref 11.2–14.5)
WBC: 1.6 10*3/uL — AB (ref 3.9–10.3)
lymph#: 0.6 10*3/uL — ABNORMAL LOW (ref 0.9–3.3)

## 2016-09-20 LAB — COMPREHENSIVE METABOLIC PANEL
ALT: 8 U/L (ref 0–55)
AST: 17 U/L (ref 5–34)
Albumin: 2.6 g/dL — ABNORMAL LOW (ref 3.5–5.0)
Alkaline Phosphatase: 60 U/L (ref 40–150)
Anion Gap: 6 mEq/L (ref 3–11)
BUN: 2 mg/dL — AB (ref 7.0–26.0)
CHLORIDE: 108 meq/L (ref 98–109)
CO2: 22 meq/L (ref 22–29)
Calcium: 9 mg/dL (ref 8.4–10.4)
Creatinine: 0.7 mg/dL (ref 0.6–1.1)
EGFR: >90 mL/min/{1.73_m2} (ref >90)
GLUCOSE: 85 mg/dL (ref 70–140)
Potassium: 3.6 mEq/L (ref 3.5–5.1)
SODIUM: 136 meq/L (ref 136–145)
Total Bilirubin: <0.22 mg/dL (ref 0.20–1.20)
Total Protein: 6.1 g/dL — ABNORMAL LOW (ref 6.4–8.3)

## 2016-09-20 LAB — PREPARE RBC (CROSSMATCH)

## 2016-09-20 LAB — ABO/RH: ABO/RH(D): A POS

## 2016-09-20 MED ORDER — DIPHENHYDRAMINE HCL 25 MG PO CAPS
ORAL_CAPSULE | ORAL | Status: AC
  Filled 2016-09-20: qty 1

## 2016-09-20 MED ORDER — ACETAMINOPHEN 325 MG PO TABS
ORAL_TABLET | ORAL | Status: AC
  Filled 2016-09-20: qty 2

## 2016-09-20 MED ORDER — ACETAMINOPHEN 325 MG PO TABS
650.0000 mg | ORAL_TABLET | Freq: Once | ORAL | Status: AC
  Administered 2016-09-20: 650 mg via ORAL

## 2016-09-20 MED ORDER — OXYCODONE-ACETAMINOPHEN 10-325 MG PO TABS: 1.0000 | ORAL_TABLET | Freq: Four times a day (QID) | ORAL | 0 refills | Status: DC | PRN

## 2016-09-20 MED ORDER — SODIUM CHLORIDE 0.9 % IV SOLN
250.0000 mL | Freq: Once | INTRAVENOUS | Status: AC
  Administered 2016-09-20: 250 mL via INTRAVENOUS

## 2016-09-20 MED ORDER — HEPARIN SOD (PORK) LOCK FLUSH 100 UNIT/ML IV SOLN
500.0000 [IU] | Freq: Every day | INTRAVENOUS | Status: AC | PRN
  Administered 2016-09-20: 500 [IU]
  Filled 2016-09-20: qty 5

## 2016-09-20 MED ORDER — SODIUM CHLORIDE 0.9% FLUSH
10.0000 mL | INTRAVENOUS | Status: AC | PRN
  Administered 2016-09-20: 10 mL
  Filled 2016-09-20: qty 10

## 2016-09-20 MED ORDER — TBO-FILGRASTIM 300 MCG/0.5ML ~~LOC~~ SOSY
300.0000 ug | PREFILLED_SYRINGE | Freq: Once | SUBCUTANEOUS | Status: AC
  Administered 2016-09-20: 300 ug via SUBCUTANEOUS
  Filled 2016-09-20: qty 0.5

## 2016-09-20 MED ORDER — ONDANSETRON HCL 4 MG PO TABS: 4.0000 mg | ORAL_TABLET | Freq: Three times a day (TID) | ORAL | 0 refills | Status: DC | PRN

## 2016-09-20 MED ORDER — DIPHENHYDRAMINE HCL 25 MG PO CAPS
25.0000 mg | ORAL_CAPSULE | Freq: Once | ORAL | Status: AC
  Administered 2016-09-20: 25 mg via ORAL

## 2016-09-20 MED FILL — OXYCODONE-ACETAMINOPHEN 10-: 10-325 | 15 days supply | Qty: 60 | Fill #0

## 2016-09-20 MED FILL — ONDANSETRON HCL 4 MG TABLET: 4 | 7 days supply | Qty: 20 | Fill #0

## 2016-09-20 NOTE — Patient Instructions (Signed)
Blood Transfusion, Adult, Care After This sheet gives you information about how to care for yourself after your procedure. Your health care provider may also give you more specific instructions. If you have problems or questions, contact your health care provider. What can I expect after the procedure? After your procedure, it is common to have:  Bruising and soreness where the IV tube was inserted.  Headache. Follow these instructions at home:  Take over-the-counter and prescription medicines only as told by your health care provider.  Return to your normal activities as told by your health care provider.  Follow instructions from your health care provider about how to take care of your IV insertion site. Make sure you:  Wash your hands with soap and water before you change your bandage (dressing). If soap and water are not available, use hand sanitizer.  Change your dressing as told by your health care provider.  Check your IV insertion site every day for signs of infection. Check for:  More redness, swelling, or pain.  More fluid or blood.  Warmth.  Pus or a bad smell. Contact a health care provider if:  You have more redness, swelling, or pain around the IV insertion site.  You have more fluid or blood coming from the IV insertion site.  Your IV insertion site feels warm to the touch.  You have pus or a bad smell coming from the IV insertion site.  Your urine turns pink, red, or brown.  You feel weak after doing your normal activities. Get help right away if:  You have signs of a serious allergic or immune system reaction, including:  Itchiness.  Hives.  Trouble breathing.  Anxiety.  Chest or lower back pain.  Fever, flushing, and chills.  Rapid pulse.  Rash.  Diarrhea.  Vomiting.  Dark urine.  Serious headache.  Dizziness.  Stiff neck.  Yellow coloration of the face or the white parts of the eyes (jaundice). This information is not  intended to replace advice given to you by your health care provider. Make sure you discuss any questions you have with your health care provider. Document Released: 05/01/2014 Document Revised: 12/08/2015 Document Reviewed: 10/25/2015 Elsevier Interactive Patient Education  2017 [employer].

## 2016-09-21 ENCOUNTER — Telehealth: Payer: Self-pay | Admitting: Internal Medicine

## 2016-09-21 ENCOUNTER — Ambulatory Visit (HOSPITAL_COMMUNITY): Admission: RE | Admit: 2016-09-21 | Payer: Medicaid Other | Source: Ambulatory Visit

## 2016-09-21 NOTE — Telephone Encounter (Signed)
sw pt to confirm 6/1 infusion appt per sch msg

## 2016-09-22 ENCOUNTER — Telehealth: Payer: Self-pay | Admitting: Internal Medicine

## 2016-09-22 NOTE — Telephone Encounter (Signed)
Called to confirm additional appts added - patient aware and will pick up schedule.

## 2016-09-22 NOTE — Telephone Encounter (Signed)
Patient called and siad that they forgot to put her ton the transportation list at her location and she will not be able to make her infusion today

## 2016-09-22 NOTE — Telephone Encounter (Signed)
lvm to inform pt of r/s 6/1 appt to 6/2 at 0900 per sch msg

## 2016-09-23 ENCOUNTER — Ambulatory Visit: Payer: Medicaid Other

## 2016-09-23 DIAGNOSIS — C7951 Secondary malignant neoplasm of bone: Secondary | ICD-10-CM

## 2016-09-23 DIAGNOSIS — C3490 Malignant neoplasm of unspecified part of unspecified bronchus or lung: Secondary | ICD-10-CM

## 2016-09-23 DIAGNOSIS — D702 Other drug-induced agranulocytosis: Secondary | ICD-10-CM

## 2016-09-23 MED ORDER — HEPARIN SOD (PORK) LOCK FLUSH 100 UNIT/ML IV SOLN
500.0000 [IU] | Freq: Once | INTRAVENOUS | Status: AC
  Administered 2016-09-23: 500 [IU] via INTRAVENOUS
  Filled 2016-09-23: qty 5

## 2016-09-23 MED ORDER — ACETAMINOPHEN 325 MG PO TABS
ORAL_TABLET | ORAL | Status: AC
  Filled 2016-09-23: qty 2

## 2016-09-23 MED ORDER — SODIUM CHLORIDE 0.9% FLUSH
10.0000 mL | INTRAVENOUS | Status: DC | PRN
  Administered 2016-09-23: 10 mL via INTRAVENOUS
  Filled 2016-09-23: qty 10

## 2016-09-23 MED ORDER — DIPHENHYDRAMINE HCL 25 MG PO CAPS
ORAL_CAPSULE | ORAL | Status: AC
  Filled 2016-09-23: qty 1

## 2016-09-23 MED ORDER — DIPHENHYDRAMINE HCL 25 MG PO CAPS
25.0000 mg | ORAL_CAPSULE | Freq: Once | ORAL | Status: AC
  Administered 2016-09-23: 25 mg via ORAL

## 2016-09-23 MED ORDER — ACETAMINOPHEN 325 MG PO TABS
650.0000 mg | ORAL_TABLET | Freq: Once | ORAL | Status: AC
  Administered 2016-09-23: 650 mg via ORAL

## 2016-09-23 NOTE — Progress Notes (Signed)
Patient here for her 2nd unit PRBCs today. She reports that she does not have her blood bracelet with her, stating that she "threw it away in the trash at home". She states "No one told me I needed to keep it and bring it back". Blood bank notified. MD Alvy Bimler notified. Per MD Alvy Bimler, patient cannot receive blood transfusion today. Patient is to be discharged and call the [facility] on Monday, 09/25/16, to schedule a new appointment. Patient verbalizes understanding that she will need to reschedule her appointment and that she cannot receieve her blood today. The patient verbalizes that she will call MD Mohamed's office on 09/25/16 to reschedule her appointment. The patient and her mother verbalize understanding that blood bank bracelets need to kept for 72 hours after they are issued.

## 2016-09-23 NOTE — Patient Instructions (Signed)
Blood Transfusion, Adult, Care After This sheet gives you information about how to care for yourself after your procedure. Your health care provider may also give you more specific instructions. If you have problems or questions, contact your health care provider. What can I expect after the procedure? After your procedure, it is common to have:  Bruising and soreness where the IV tube was inserted.  Headache.  Follow these instructions at home:  Take over-the-counter and prescription medicines only as told by your health care provider.  Return to your normal activities as told by your health care provider.  Follow instructions from your health care provider about how to take care of your IV insertion site. Make sure you: ? Wash your hands with soap and water before you change your bandage (dressing). If soap and water are not available, use hand sanitizer. ? Change your dressing as told by your health care provider.  Check your IV insertion site every day for signs of infection. Check for: ? More redness, swelling, or pain. ? More fluid or blood. ? Warmth. ? Pus or a bad smell. Contact a health care provider if:  You have more redness, swelling, or pain around the IV insertion site.  You have more fluid or blood coming from the IV insertion site.  Your IV insertion site feels warm to the touch.  You have pus or a bad smell coming from the IV insertion site.  Your urine turns pink, red, or brown.  You feel weak after doing your normal activities. Get help right away if:  You have signs of a serious allergic or immune system reaction, including: ? Itchiness. ? Hives. ? Trouble breathing. ? Anxiety. ? Chest or lower back pain. ? Fever, flushing, and chills. ? Rapid pulse. ? Rash. ? Diarrhea. ? Vomiting. ? Dark urine. ? Serious headache. ? Dizziness. ? Stiff neck. ? Yellow coloration of the face or the white parts of the eyes (jaundice). This information is not  intended to replace advice given to you by your health care provider. Make sure you discuss any questions you have with your health care provider. Document Released: 05/01/2014 Document Revised: 12/08/2015 Document Reviewed: 10/25/2015 Elsevier Interactive Patient Education  2018 [employer]  

## 2016-09-24 LAB — BPAM RBC
Blood Product Expiration Date: 201806072359
Blood Product Expiration Date: 201806082359
ISSUE DATE / TIME: 201805301214
Unit Type and Rh: 6200
Unit Type and Rh: 6200

## 2016-09-24 LAB — TYPE AND SCREEN
ABO/RH(D): A POS
ANTIBODY SCREEN: NEGATIVE
Unit division: 0
Unit division: 0

## 2016-09-25 ENCOUNTER — Telehealth: Payer: Self-pay | Admitting: Internal Medicine

## 2016-09-25 NOTE — Telephone Encounter (Signed)
Spoke with patient re lab/blood 6/8 @ 8:30am.

## 2016-09-28 ENCOUNTER — Other Ambulatory Visit: Payer: Self-pay | Admitting: *Deleted

## 2016-09-28 DIAGNOSIS — G893 Neoplasm related pain (acute) (chronic): Secondary | ICD-10-CM

## 2016-09-29 ENCOUNTER — Encounter: Payer: Self-pay | Admitting: *Deleted

## 2016-09-29 ENCOUNTER — Other Ambulatory Visit (HOSPITAL_BASED_OUTPATIENT_CLINIC_OR_DEPARTMENT_OTHER): Payer: Medicaid Other

## 2016-09-29 ENCOUNTER — Ambulatory Visit (HOSPITAL_BASED_OUTPATIENT_CLINIC_OR_DEPARTMENT_OTHER): Payer: Medicaid Other

## 2016-09-29 ENCOUNTER — Other Ambulatory Visit: Payer: Self-pay | Admitting: Medical Oncology

## 2016-09-29 VITALS — BP 101/70 | HR 93 | Resp 17

## 2016-09-29 DIAGNOSIS — C3491 Malignant neoplasm of unspecified part of right bronchus or lung: Secondary | ICD-10-CM

## 2016-09-29 DIAGNOSIS — E860 Dehydration: Secondary | ICD-10-CM

## 2016-09-29 DIAGNOSIS — Z5189 Encounter for other specified aftercare: Secondary | ICD-10-CM | POA: Diagnosis not present

## 2016-09-29 LAB — COMPREHENSIVE METABOLIC PANEL
ALBUMIN: 2.9 g/dL — AB (ref 3.5–5.0)
ALK PHOS: 84 U/L (ref 40–150)
ALT: 6 U/L (ref 0–55)
AST: 13 U/L (ref 5–34)
Anion Gap: 9 mEq/L (ref 3–11)
BILIRUBIN TOTAL: 0.35 mg/dL (ref 0.20–1.20)
BUN: 2 mg/dL — AB (ref 7.0–26.0)
CALCIUM: 8.4 mg/dL (ref 8.4–10.4)
CO2: 20 mEq/L — ABNORMAL LOW (ref 22–29)
Chloride: 109 mEq/L (ref 98–109)
Creatinine: 0.7 mg/dL (ref 0.6–1.1)
EGFR: >90 mL/min/{1.73_m2} (ref >90)
GLUCOSE: 109 mg/dL (ref 70–140)
POTASSIUM: 3.7 meq/L (ref 3.5–5.1)
SODIUM: 139 meq/L (ref 136–145)
TOTAL PROTEIN: 6.9 g/dL (ref 6.4–8.3)

## 2016-09-29 LAB — CBC WITH DIFFERENTIAL/PLATELET
BASO%: 0.7 % (ref 0.0–2.0)
Basophils Absolute: 0 10*3/uL (ref 0.0–0.1)
EOS%: 0.3 % (ref 0.0–7.0)
Eosinophils Absolute: 0 10*3/uL (ref 0.0–0.5)
HCT: 35.5 % (ref 34.8–46.6)
HEMOGLOBIN: 11.8 g/dL (ref 11.6–15.9)
LYMPH%: 15 % (ref 14.0–49.7)
MCH: 32 pg (ref 25.1–34.0)
MCHC: 33.3 g/dL (ref 31.5–36.0)
MCV: 96.3 fL (ref 79.5–101.0)
MONO#: 0.8 10*3/uL (ref 0.1–0.9)
MONO%: 16.7 % — AB (ref 0.0–14.0)
NEUT%: 67.3 % (ref 38.4–76.8)
NEUTROS ABS: 3.3 10*3/uL (ref 1.5–6.5)
Platelets: 528 10*3/uL — ABNORMAL HIGH (ref 145–400)
RBC: 3.69 10*6/uL — ABNORMAL LOW (ref 3.70–5.45)
RDW: 22 % — AB (ref 11.2–14.5)
WBC: 5 10*3/uL (ref 3.9–10.3)
lymph#: 0.7 10*3/uL — ABNORMAL LOW (ref 0.9–3.3)

## 2016-09-29 MED ORDER — DICLOFENAC SODIUM 1 % TD GEL: 4.0000 g | Freq: Four times a day (QID) | TRANSDERMAL | 3 refills | Status: DC

## 2016-09-29 MED ORDER — SODIUM CHLORIDE 0.9 % IV SOLN
Freq: Once | INTRAVENOUS | Status: AC
  Administered 2016-09-29: 09:00:00 via INTRAVENOUS

## 2016-09-29 MED ORDER — ONDANSETRON HCL 4 MG/2ML IJ SOLN
8.0000 mg | Freq: Once | INTRAMUSCULAR | Status: AC
  Administered 2016-09-29: 8 mg via INTRAVENOUS

## 2016-09-29 MED ORDER — SODIUM CHLORIDE 0.9% FLUSH
10.0000 mL | INTRAVENOUS | Status: DC | PRN
  Administered 2016-09-29: 10 mL via INTRAVENOUS
  Filled 2016-09-29: qty 10

## 2016-09-29 MED ORDER — ONDANSETRON HCL 40 MG/20ML IJ SOLN: Freq: Once | INTRAMUSCULAR | Status: DC

## 2016-09-29 MED ORDER — ONDANSETRON HCL 4 MG/2ML IJ SOLN
INTRAMUSCULAR | Status: AC
  Filled 2016-09-29: qty 4

## 2016-09-29 MED ORDER — HEPARIN SOD (PORK) LOCK FLUSH 100 UNIT/ML IV SOLN
500.0000 [IU] | Freq: Once | INTRAVENOUS | Status: AC
  Administered 2016-09-29: 500 [IU] via INTRAVENOUS
  Filled 2016-09-29: qty 5

## 2016-09-29 MED FILL — VOLTAREN 1% GEL: 1 | 13 days supply | Qty: 200 | Fill #0

## 2016-09-29 NOTE — Progress Notes (Signed)
Per MD, no blood needed. 1L Normal saline and 8mg  IV zofran ordered.   Wylene Simmer, BSN, RN 09/29/2016 9:12 AM

## 2016-09-29 NOTE — Patient Instructions (Signed)
Dehydration, Adult Dehydration is a condition in which there is not enough fluid or water in the body. This happens when you lose more fluids than you take in. Important organs, such as the kidneys, brain, and heart, cannot function without a proper amount of fluids. Any loss of fluids from the body can lead to dehydration. Dehydration can range from mild to severe. This condition should be treated right away to prevent it from becoming severe. What are the causes? This condition may be caused by:  Vomiting.  Diarrhea.  Excessive sweating, such as from heat exposure or exercise.  Not drinking enough fluid, especially: ? When ill. ? While doing activity that requires a lot of energy.  Excessive urination.  Fever.  Infection.  Certain medicines, such as medicines that cause the body to lose excess fluid (diuretics).  Inability to access safe drinking water.  Reduced physical ability to get adequate water and food.  What increases the risk? This condition is more likely to develop in people:  Who have a poorly controlled long-term (chronic) illness, such as diabetes, heart disease, or kidney disease.  Who are age 65 or older.  Who are disabled.  Who live in a place with high altitude.  Who play endurance sports.  What are the signs or symptoms? Symptoms of mild dehydration may include:  Thirst.  Dry lips.  Slightly dry mouth.  Dry, warm skin.  Dizziness. Symptoms of moderate dehydration may include:  Very dry mouth.  Muscle cramps.  Dark urine. Urine may be the color of tea.  Decreased urine production.  Decreased tear production.  Heartbeat that is irregular or faster than normal (palpitations).  Headache.  Light-headedness, especially when you stand up from a sitting position.  Fainting (syncope). Symptoms of severe dehydration may include:  Changes in skin, such as: ? Cold and clammy skin. ? Blotchy (mottled) or pale skin. ? Skin that does  not quickly return to normal after being lightly pinched and released (poor skin turgor).  Changes in body fluids, such as: ? Extreme thirst. ? No tear production. ? Inability to sweat when body temperature is high, such as in hot weather. ? Very little urine production.  Changes in vital signs, such as: ? Weak pulse. ? Pulse that is more than 100 beats a minute when sitting still. ? Rapid breathing. ? Low blood pressure.  Other changes, such as: ? Sunken eyes. ? Cold hands and feet. ? Confusion. ? Lack of energy (lethargy). ? Difficulty waking up from sleep. ? Short-term weight loss. ? Unconsciousness. How is this diagnosed? This condition is diagnosed based on your symptoms and a physical exam. Blood and urine tests may be done to help confirm the diagnosis. How is this treated? Treatment for this condition depends on the severity. Mild or moderate dehydration can often be treated at home. Treatment should be started right away. Do not wait until dehydration becomes severe. Severe dehydration is an emergency and it needs to be treated in a hospital. Treatment for mild dehydration may include:  Drinking more fluids.  Replacing salts and minerals in your blood (electrolytes) that you may have lost. Treatment for moderate dehydration may include:  Drinking an oral rehydration solution (ORS). This is a drink that helps you replace fluids and electrolytes (rehydrate). It can be found at pharmacies and retail stores. Treatment for severe dehydration may include:  Receiving fluids through an IV tube.  Receiving an electrolyte solution through a feeding tube that is passed through your nose   and into your stomach (nasogastric tube, or NG tube).  Correcting any abnormalities in electrolytes.  Treating the underlying cause of dehydration. Follow these instructions at home:  If directed by your health care provider, drink an ORS: ? Make an ORS by following instructions on the  package. ? Start by drinking small amounts, about  cup (120 mL) every 5-10 minutes. ? Slowly increase how much you drink until you have taken the amount recommended by your health care provider.  Drink enough clear fluid to keep your urine clear or pale yellow. If you were told to drink an ORS, finish the ORS first, then start slowly drinking other clear fluids. Drink fluids such as: ? Water. Do not drink only water. Doing that can lead to having too little salt (sodium) in the body (hyponatremia). ? Ice chips. ? Fruit juice that you have added water to (diluted fruit juice). ? Low-calorie sports drinks.  Avoid: ? Alcohol. ? Drinks that contain a lot of sugar. These include high-calorie sports drinks, fruit juice that is not diluted, and soda. ? Caffeine. ? Foods that are greasy or contain a lot of fat or sugar.  Take over-the-counter and prescription medicines only as told by your health care provider.  Do not take sodium tablets. This can lead to having too much sodium in the body (hypernatremia).  Eat foods that contain a healthy balance of electrolytes, such as bananas, oranges, potatoes, tomatoes, and spinach.  Keep all follow-up visits as told by your health care provider. This is important. Contact a health care provider if:  You have abdominal pain that: ? Gets worse. ? Stays in one area (localizes).  You have a rash.  You have a stiff neck.  You are more irritable than usual.  You are sleepier or more difficult to wake up than usual.  You feel weak or dizzy.  You feel very thirsty.  You have urinated only a small amount of very dark urine over 6-8 hours. Get help right away if:  You have symptoms of severe dehydration.  You cannot drink fluids without vomiting.  Your symptoms get worse with treatment.  You have a fever.  You have a severe headache.  You have vomiting or diarrhea that: ? Gets worse. ? Does not go away.  You have blood or green matter  (bile) in your vomit.  You have blood in your stool. This may cause stool to look black and tarry.  You have not urinated in 6-8 hours.  You faint.  Your heart rate while sitting still is over 100 beats a minute.  You have trouble breathing. This information is not intended to replace advice given to you by your health care provider. Make sure you discuss any questions you have with your health care provider. Document Released: 04/10/2005 Document Revised: 11/05/2015 Document Reviewed: 06/04/2015 Elsevier Interactive Patient Education  2018 [employer]vier Inc.  

## 2016-10-04 ENCOUNTER — Encounter: Payer: Self-pay | Admitting: Internal Medicine

## 2016-10-04 ENCOUNTER — Telehealth: Payer: Self-pay | Admitting: Internal Medicine

## 2016-10-04 ENCOUNTER — Ambulatory Visit (HOSPITAL_BASED_OUTPATIENT_CLINIC_OR_DEPARTMENT_OTHER): Payer: Medicaid Other | Admitting: Internal Medicine

## 2016-10-04 ENCOUNTER — Ambulatory Visit (HOSPITAL_BASED_OUTPATIENT_CLINIC_OR_DEPARTMENT_OTHER): Payer: Medicaid Other

## 2016-10-04 ENCOUNTER — Other Ambulatory Visit (HOSPITAL_BASED_OUTPATIENT_CLINIC_OR_DEPARTMENT_OTHER): Payer: Medicaid Other

## 2016-10-04 VITALS — BP 117/87 | HR 95 | Temp 99.2°F | Resp 17

## 2016-10-04 DIAGNOSIS — Z5111 Encounter for antineoplastic chemotherapy: Secondary | ICD-10-CM | POA: Diagnosis present

## 2016-10-04 DIAGNOSIS — C3491 Malignant neoplasm of unspecified part of right bronchus or lung: Secondary | ICD-10-CM

## 2016-10-04 DIAGNOSIS — C7951 Secondary malignant neoplasm of bone: Secondary | ICD-10-CM

## 2016-10-04 DIAGNOSIS — C7931 Secondary malignant neoplasm of brain: Secondary | ICD-10-CM | POA: Diagnosis not present

## 2016-10-04 DIAGNOSIS — G893 Neoplasm related pain (acute) (chronic): Secondary | ICD-10-CM | POA: Diagnosis present

## 2016-10-04 DIAGNOSIS — C3490 Malignant neoplasm of unspecified part of unspecified bronchus or lung: Secondary | ICD-10-CM

## 2016-10-04 LAB — COMPREHENSIVE METABOLIC PANEL
ALK PHOS: 78 U/L (ref 40–150)
ALT: <6 U/L (ref 0–55)
ANION GAP: 9 meq/L (ref 3–11)
AST: 12 U/L (ref 5–34)
Albumin: 2.5 g/dL — ABNORMAL LOW (ref 3.5–5.0)
BILIRUBIN TOTAL: 0.44 mg/dL (ref 0.20–1.20)
BUN: 3.9 mg/dL — ABNORMAL LOW (ref 7.0–26.0)
CALCIUM: 8.7 mg/dL (ref 8.4–10.4)
CO2: 22 mEq/L (ref 22–29)
CREATININE: 0.7 mg/dL (ref 0.6–1.1)
Chloride: 108 mEq/L (ref 98–109)
Glucose: 93 mg/dl (ref 70–140)
Potassium: 3.8 mEq/L (ref 3.5–5.1)
Sodium: 139 mEq/L (ref 136–145)
TOTAL PROTEIN: 6.3 g/dL — AB (ref 6.4–8.3)

## 2016-10-04 LAB — CBC WITH DIFFERENTIAL/PLATELET
BASO%: 0.4 % (ref 0.0–2.0)
Basophils Absolute: 0 10*3/uL (ref 0.0–0.1)
EOS%: 0.7 % (ref 0.0–7.0)
Eosinophils Absolute: 0 10*3/uL (ref 0.0–0.5)
HEMATOCRIT: 30.8 % — AB (ref 34.8–46.6)
HGB: 9.7 g/dL — ABNORMAL LOW (ref 11.6–15.9)
LYMPH#: 0.7 10*3/uL — AB (ref 0.9–3.3)
LYMPH%: 15.3 % (ref 14.0–49.7)
MCH: 30.8 pg (ref 25.1–34.0)
MCHC: 31.5 g/dL (ref 31.5–36.0)
MCV: 97.8 fL (ref 79.5–101.0)
MONO#: 0.7 10*3/uL (ref 0.1–0.9)
MONO%: 15.1 % — ABNORMAL HIGH (ref 0.0–14.0)
NEUT%: 68.5 % (ref 38.4–76.8)
NEUTROS ABS: 3.1 10*3/uL (ref 1.5–6.5)
PLATELETS: 433 10*3/uL — AB (ref 145–400)
RBC: 3.15 10*6/uL — AB (ref 3.70–5.45)
RDW: 20 % — ABNORMAL HIGH (ref 11.2–14.5)
WBC: 4.5 10*3/uL (ref 3.9–10.3)

## 2016-10-04 MED ORDER — PROCHLORPERAZINE MALEATE 10 MG PO TABS
10.0000 mg | ORAL_TABLET | Freq: Once | ORAL | Status: AC
  Administered 2016-10-04: 10 mg via ORAL

## 2016-10-04 MED ORDER — PROCHLORPERAZINE MALEATE 10 MG PO TABS
ORAL_TABLET | ORAL | Status: AC
  Filled 2016-10-04: qty 1

## 2016-10-04 MED ORDER — HEPARIN SOD (PORK) LOCK FLUSH 100 UNIT/ML IV SOLN
500.0000 [IU] | Freq: Once | INTRAVENOUS | Status: AC | PRN
  Administered 2016-10-04: 500 [IU]
  Filled 2016-10-04: qty 5

## 2016-10-04 MED ORDER — OXYCODONE-ACETAMINOPHEN 10-325 MG PO TABS: 1.0000 | ORAL_TABLET | Freq: Four times a day (QID) | ORAL | 0 refills | Status: DC | PRN

## 2016-10-04 MED ORDER — FENTANYL 75 MCG/HR TD PT72: 75.0000 ug | MEDICATED_PATCH | TRANSDERMAL | 0 refills | Status: DC

## 2016-10-04 MED ORDER — SODIUM CHLORIDE 0.9% FLUSH
10.0000 mL | INTRAVENOUS | Status: DC | PRN
  Administered 2016-10-04: 10 mL
  Filled 2016-10-04: qty 10

## 2016-10-04 MED ORDER — SODIUM CHLORIDE 0.9 % IV SOLN
Freq: Once | INTRAVENOUS | Status: AC
  Administered 2016-10-04: 09:00:00 via INTRAVENOUS

## 2016-10-04 MED ORDER — SODIUM CHLORIDE 0.9 % IV SOLN
1000.0000 mg/m2 | Freq: Once | INTRAVENOUS | Status: AC
  Administered 2016-10-04: 1748 mg via INTRAVENOUS
  Filled 2016-10-04: qty 45.97

## 2016-10-04 NOTE — Telephone Encounter (Signed)
Scheduled additional appts per 6/13 los.

## 2016-10-04 NOTE — Progress Notes (Signed)
[facility] [phone]   [phone]  OFFICE PROGRESS NOTE  [name], MD [place] [address]  DIAGNOSIS: Stage IV (T1a, N2, M1b) non-small cell lung cancer, adenocarcinoma with negative EGFR mutation and presented with a right hilar mass, subcarinal lymphadenopathy as well as a large soft tissue mass in the posterior iliac bone and metastatic bone lesions diagnosed in August 2016.  PRIOR THERAPY: 1) Status post palliative radiotherapy to the soft tissue mass in the left iliac bone under the care of Dr. Valere Dross. 2) Systemic chemotherapy with carboplatin for AUC of 5 and Alimta 500 MG/M2 every 3 weeks. First dose expected on 02/22/2015. Status post 6 cycles. Starting from cycle #3 Alimta was reduced to 400 MG/M2 secondary to neutropenia after the first 2 cycles. 3) Maintenance systemic chemotherapy with single agent Alimta 500 MG/M2 every 3 weeks. First dose 07/19/2015. Status post 3 cycles. Last dose was given 08/30/2015 discontinued secondary to disease progression. 4)  Immunotherapy treatment with Tecentriq (Atezolizumab) 1200 MG IV every 3 weeks, for his dose 10/14/2015. Status post 6 cycles, discontinued secondary to disease progression. 5) whole brain irradiation for multiple new brain metastasis expected to be completed on 02/12/2016. 6) palliative radiotherapy to the progressive bone lesion in the left hip. 7) systemic chemotherapy with docetaxel 75 MG/M2 and Cyramza 10 MG/KG every 3 weeks status post 5 cycles. Discontinued today secondary to disease progression.  CURRENT THERAPY:  1) systemic chemotherapy with gemcitabine 1000 MG/M2 on days 1 and 8 every 3 weeks. First dose 07/12/2016. Status post 4 cycles. 2) Xgeva 120 mg subcutaneously on monthly basis for metastatic bone disease  INTERVAL HISTORY: Andrea Andrea [name] 46 y.o. Andrea [name] returns to the clinic today for follow-up visit. The patient has been tolerating her treatment  with single agent gemcitabine fairly well except for occasional nausea. She had a lot of pain after the Xgeva injection and she missed her appointment for the restaging CT scan. She is feeling fine today was no specific complaints. She denied having any chest pain, shortness of breath, cough or hemoptysis. She continues to have pain on the hip a mainly on the right side.rea. She was requesting refill of her pain medication. She denied having any fever or chills. She is here today for evaluation before starting cycle #5.   MEDICAL HISTORY: Past Medical History:  Diagnosis Date  . 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 Anginal pain ([place])    chest pain not sure what related to  . Anxiety   . Bone cancer ([place])    lung ca with bone mets  . Chronic back pain   . Depression   . Dysmenorrhea 12/06/2009   Qualifier: Diagnosis of  By: [name] RN, Regino Schultze    . Encounter for antineoplastic immunotherapy 10/07/2015  . Family history of adverse reaction to anesthesia    mother stopped breathing after surgery  . Goals of care, counseling/discussion 05/03/2016  . Headache    stress  . Low back pain radiating to left leg 11/29/2010  . Non-small cell carcinoma of right lung, stage 4 ([place]) 01/01/2015  . S/P radiation therapy 01/01/2015 through 01/21/2015     Left iliac bone 3500 cGy in 14 sessions   . Seizure ([place])   . Shortness of breath dyspnea    with exertion, Pt denies 02/2015    ALLERGIES:  is allergic to morphine and related and hydrocodone.  MEDICATIONS:  Current Outpatient Prescriptions  Medication Sig Dispense  Refill  . diclofenac sodium (VOLTAREN) 1 % GEL Apply 4 g topically 4 (four) times daily. 200 g 3  . dronabinol (MARINOL) 2.5 MG capsule Take 1 capsule (2.5 mg total) by mouth 2 (two) times daily before a meal. 60 capsule 0  . fentaNYL (DURAGESIC - DOSED MCG/HR) 75 MCG/HR Place 1 patch (75 mcg total) onto the skin every 3  (three) days. 10 patch 0  . levETIRAcetam (KEPPRA) 1000 MG tablet Take 1 tablet (1,000 mg total) by mouth 2 (two) times daily. 180 tablet 4  . lidocaine-prilocaine (EMLA) cream Apply 1 teaspoon to skin 1 to 2 hours before use cover to keep in contact with skin 30 g PRN  . loratadine (CLARITIN) 10 MG tablet Take 1 tablet (10 mg total) by mouth daily.    . mirtazapine (REMERON) 30 MG tablet Take 1 tablet (30 mg total) by mouth at bedtime. 30 tablet 1  . Multiple Vitamins-Minerals (MULTIVITAMIN WITH MINERALS) tablet Take by mouth.    . ondansetron (ZOFRAN) 4 MG tablet Take 1 tablet (4 mg total) by mouth every 8 (eight) hours as needed for nausea or vomiting. 20 tablet 0  . oxyCODONE-acetaminophen (PERCOCET) 10-325 MG tablet Take 1 tablet by mouth every 6 (six) hours as needed for pain. 60 tablet 0  . potassium chloride SA (KLOR-CON M20) 20 MEQ tablet Take 2 tablets (40 mEq total) by mouth daily. 20 tablet 0  . senna-docusate (SENOKOT-S) 8.6-50 MG tablet Take 2 tablets by mouth at bedtime. 30 tablet 0   No current facility-administered medications for this visit.     SURGICAL HISTORY:  Past Surgical History:  Procedure Laterality Date  . laproscopy surgery to check her tubes    . LUMBAR LAMINECTOMY/DECOMPRESSION MICRODISCECTOMY Left 05/01/2014   Procedure: Microdiscectomy - L5-S1 - left;  Surgeon: Eustace Moore, MD;  Location: [facility];  Service: Neurosurgery;  Laterality: Left;    REVIEW OF SYSTEMS:  A comprehensive review of systems was negative except for: Constitutional: positive for fatigue Gastrointestinal: positive for nausea Musculoskeletal: positive for bone pain   PHYSICAL EXAMINATION: General appearance: alert, cooperative, fatigued and no distress Head: Normocephalic, without obvious abnormality, atraumatic Neck: no adenopathy, no JVD, supple, symmetrical, trachea midline and thyroid not enlarged, symmetric, no tenderness/mass/nodules Lymph nodes: Cervical, supraclavicular, and  axillary nodes normal. Resp: clear to auscultation bilaterally Back: symmetric, no curvature. ROM normal. No CVA tenderness. Cardio: regular rate and rhythm, S1, S2 normal, no murmur, click, rub or gallop GI: soft, non-tender; bowel sounds normal; no masses,  no organomegaly Extremities: extremities normal, atraumatic, no cyanosis or edema  ECOG PERFORMANCE STATUS: 1 - Symptomatic but completely ambulatory  There were no vitals taken for this visit.  LABORATORY DATA: Lab Results  Component Value Date   WBC 5.0 09/29/2016   HGB 11.8 09/29/2016   HCT 35.5 09/29/2016   MCV 96.3 09/29/2016   PLT 528 (H) 09/29/2016      Chemistry      Component Value Date/Time   NA 139 09/29/2016 0829   K 3.7 09/29/2016 0829   CL 108 (H) 05/22/2016 1546   CO2 20 (L) 09/29/2016 0829   BUN 2.0 (L) 09/29/2016 0829   CREATININE 0.7 09/29/2016 0829      Component Value Date/Time   CALCIUM 8.4 09/29/2016 0829   ALKPHOS 84 09/29/2016 0829   AST 13 09/29/2016 0829   ALT 6 09/29/2016 0829   BILITOT 0.35 09/29/2016 0829       RADIOGRAPHIC STUDIES: No results found.  ASSESSMENT AND PLAN:  This is a very pleasant 46 years old Andrea Andrea [name] with metastatic non-small cell lung cancer, adenocarcinoma with bone and brain metastases. She is status post several chemotherapy regimens as well as treatment with immunotherapy with Tecentriq (Atezolizumab) discontinued secondary to disease progression. The patient is currently undergoing treatment with single agent gemcitabine status post 4 cycles and has been tolerating this treatment well. She was supposed to have repeat CT scan of the chest, abdomen and pelvis before her treatment today but unfortunately for the second time she missed her appointment. I will reschedule her CT scan to be performed next week. She will proceed with day 1 of cycle #5 today. I explained to the patient that there will not be able to proceed with any further treatment  unless she has her imaging studies done. I will see her back for follow-up visit in 3 weeks for reevaluation before starting cycle #6. For pain management, she was given a refill for fentanyl patch and Percocet. For the metastatic bone disease, she will continue her current treatment with Xgeva. The patient was advised to call immediately if she has any concerning symptoms in the interval. The patient voices understanding of current disease status and treatment options and is in agreement with the current care plan. All questions were answered. The patient knows to call the clinic with any problems, questions or concerns. We can certainly see the patient much sooner if necessary.  Disclaimer: This note was dictated with voice recognition software. Similar sounding words can inadvertently be transcribed and may not be corrected upon review.

## 2016-10-04 NOTE — Progress Notes (Signed)
Per MD, patient will receive treatment today despite not having recent scan. Scheduled to recieve scan on Monday (06/18).   Wylene Simmer, BSN, RN 10/04/2016 9:34 AM

## 2016-10-04 NOTE — Patient Instructions (Signed)
**  SCAN APPOINTMENT SCHEDULED: Monday, 10/09/16, at 3:00pm.**   [facility] Discharge Instructions for Patients Receiving Chemotherapy  Today you received the following chemotherapy agents: Gemcitabine   To help prevent nausea and vomiting after your treatment, we encourage you to take your nausea medication as prescribed.   If you develop nausea and vomiting that is not controlled by your nausea medication, call the clinic.   BELOW ARE SYMPTOMS THAT SHOULD BE REPORTED IMMEDIATELY:  *FEVER GREATER THAN 100.5 F  *CHILLS WITH OR WITHOUT FEVER  NAUSEA AND VOMITING THAT IS NOT CONTROLLED WITH YOUR NAUSEA MEDICATION  *UNUSUAL SHORTNESS OF BREATH  *UNUSUAL BRUISING OR BLEEDING  TENDERNESS IN MOUTH AND THROAT WITH OR WITHOUT PRESENCE OF ULCERS  *URINARY PROBLEMS  *BOWEL PROBLEMS  UNUSUAL RASH Items with * indicate a potential emergency and should be followed up as soon as possible.  Feel free to call the clinic you have any questions or concerns. The clinic phone number is [phone].  Please show the [place] at check-in to the Emergency Department and triage nurse.

## 2016-10-05 ENCOUNTER — Telehealth: Payer: Self-pay | Admitting: Internal Medicine

## 2016-10-05 NOTE — Telephone Encounter (Signed)
Left message for patient to get new schedule next visit. - additional appts added per 6/13 los.

## 2016-10-06 MED FILL — fentaNYL 75 MCG/HR PT72: 75 | 30 days supply | Qty: 10 | Fill #0

## 2016-10-06 MED FILL — OXYCODONE-ACETAMINOPHEN 10-: 10-325 | 15 days supply | Qty: 60 | Fill #0

## 2016-10-09 ENCOUNTER — Ambulatory Visit (HOSPITAL_COMMUNITY)
Admission: RE | Admit: 2016-10-09 | Discharge: 2016-10-09 | Disposition: A | Discharge status: Home / Self Care | Payer: Medicaid Other | Source: Ambulatory Visit | Attending: Nurse Practitioner | Admitting: Nurse Practitioner

## 2016-10-09 DIAGNOSIS — E279 Disorder of adrenal gland, unspecified: Secondary | ICD-10-CM | POA: Diagnosis not present

## 2016-10-09 DIAGNOSIS — R590 Localized enlarged lymph nodes: Secondary | ICD-10-CM | POA: Insufficient documentation

## 2016-10-09 DIAGNOSIS — C7951 Secondary malignant neoplasm of bone: Secondary | ICD-10-CM

## 2016-10-09 DIAGNOSIS — M533 Sacrococcygeal disorders, not elsewhere classified: Secondary | ICD-10-CM | POA: Diagnosis not present

## 2016-10-09 DIAGNOSIS — C3490 Malignant neoplasm of unspecified part of unspecified bronchus or lung: Secondary | ICD-10-CM | POA: Diagnosis present

## 2016-10-09 MED ORDER — HEPARIN SOD (PORK) LOCK FLUSH 100 UNIT/ML IV SOLN
INTRAVENOUS | Status: AC
  Administered 2016-10-09: 500 [IU]
  Filled 2016-10-09: qty 5

## 2016-10-09 MED ORDER — HEPARIN SOD (PORK) LOCK FLUSH 100 UNIT/ML IV SOLN
INTRAVENOUS | Status: AC
  Filled 2016-10-09: qty 5

## 2016-10-09 MED ORDER — HEPARIN SOD (PORK) LOCK FLUSH 100 UNIT/ML IV SOLN: 500.0000 [IU] | Freq: Once | INTRAVENOUS | Status: DC

## 2016-10-09 MED ORDER — IOPAMIDOL (ISOVUE-300) INJECTION 61%
30.0000 mL | Freq: Once | INTRAVENOUS | Status: AC | PRN
  Administered 2016-10-09: 30 mL via ORAL

## 2016-10-09 MED ORDER — IOPAMIDOL (ISOVUE-300) INJECTION 61%
INTRAVENOUS | Status: AC
  Administered 2016-10-09: 100 mL
  Filled 2016-10-09: qty 100

## 2016-10-09 MED ORDER — IOPAMIDOL (ISOVUE-300) INJECTION 61%
INTRAVENOUS | Status: AC
  Filled 2016-10-09: qty 30

## 2016-10-11 ENCOUNTER — Other Ambulatory Visit: Payer: Medicaid Other

## 2016-10-11 ENCOUNTER — Encounter: Payer: Self-pay | Admitting: *Deleted

## 2016-10-11 ENCOUNTER — Other Ambulatory Visit: Payer: Self-pay | Admitting: *Deleted

## 2016-10-11 ENCOUNTER — Ambulatory Visit (HOSPITAL_BASED_OUTPATIENT_CLINIC_OR_DEPARTMENT_OTHER): Payer: Medicaid Other | Admitting: Internal Medicine

## 2016-10-11 ENCOUNTER — Ambulatory Visit (HOSPITAL_BASED_OUTPATIENT_CLINIC_OR_DEPARTMENT_OTHER): Payer: Medicaid Other

## 2016-10-11 ENCOUNTER — Encounter: Payer: Self-pay | Admitting: Internal Medicine

## 2016-10-11 ENCOUNTER — Telehealth: Payer: Self-pay | Admitting: Internal Medicine

## 2016-10-11 VITALS — BP 105/78 | HR 97 | Temp 97.8°F | Resp 18

## 2016-10-11 DIAGNOSIS — C7931 Secondary malignant neoplasm of brain: Secondary | ICD-10-CM

## 2016-10-11 DIAGNOSIS — E876 Hypokalemia: Secondary | ICD-10-CM

## 2016-10-11 DIAGNOSIS — C7971 Secondary malignant neoplasm of right adrenal gland: Secondary | ICD-10-CM

## 2016-10-11 DIAGNOSIS — C3491 Malignant neoplasm of unspecified part of right bronchus or lung: Secondary | ICD-10-CM

## 2016-10-11 DIAGNOSIS — C7972 Secondary malignant neoplasm of left adrenal gland: Secondary | ICD-10-CM | POA: Diagnosis not present

## 2016-10-11 DIAGNOSIS — C7951 Secondary malignant neoplasm of bone: Secondary | ICD-10-CM | POA: Diagnosis not present

## 2016-10-11 LAB — COMPREHENSIVE METABOLIC PANEL
ALT: 17 U/L (ref 0–55)
ANION GAP: 12 meq/L — AB (ref 3–11)
AST: 34 U/L (ref 5–34)
Albumin: 2.6 g/dL — ABNORMAL LOW (ref 3.5–5.0)
Alkaline Phosphatase: 94 U/L (ref 40–150)
BUN: <2 mg/dL — ABNORMAL LOW (ref 7.0–26.0)
CALCIUM: 8.9 mg/dL (ref 8.4–10.4)
CHLORIDE: 100 meq/L (ref 98–109)
CO2: 25 mEq/L (ref 22–29)
Creatinine: 0.7 mg/dL (ref 0.6–1.1)
Glucose: 116 mg/dl (ref 70–140)
POTASSIUM: 2.9 meq/L — AB (ref 3.5–5.1)
Sodium: 136 mEq/L (ref 136–145)
Total Bilirubin: 0.29 mg/dL (ref 0.20–1.20)
Total Protein: 7.1 g/dL (ref 6.4–8.3)

## 2016-10-11 LAB — CBC WITH DIFFERENTIAL/PLATELET
BASO%: 1.2 % (ref 0.0–2.0)
BASOS ABS: 0 10*3/uL (ref 0.0–0.1)
EOS%: 0.9 % (ref 0.0–7.0)
Eosinophils Absolute: 0 10*3/uL (ref 0.0–0.5)
HEMATOCRIT: 28.6 % — AB (ref 34.8–46.6)
HGB: 9.5 g/dL — ABNORMAL LOW (ref 11.6–15.9)
LYMPH#: 0.7 10*3/uL — AB (ref 0.9–3.3)
LYMPH%: 25.7 % (ref 14.0–49.7)
MCH: 32 pg (ref 25.1–34.0)
MCHC: 33.3 g/dL (ref 31.5–36.0)
MCV: 96.1 fL (ref 79.5–101.0)
MONO#: 0.6 10*3/uL (ref 0.1–0.9)
MONO%: 20.4 % — ABNORMAL HIGH (ref 0.0–14.0)
NEUT#: 1.4 10*3/uL — ABNORMAL LOW (ref 1.5–6.5)
NEUT%: 51.8 % (ref 38.4–76.8)
PLATELETS: 338 10*3/uL (ref 145–400)
RBC: 2.97 10*6/uL — ABNORMAL LOW (ref 3.70–5.45)
RDW: 20.6 % — ABNORMAL HIGH (ref 11.2–14.5)
WBC: 2.7 10*3/uL — ABNORMAL LOW (ref 3.9–10.3)

## 2016-10-11 MED ORDER — HEPARIN SOD (PORK) LOCK FLUSH 100 UNIT/ML IV SOLN
500.0000 [IU] | Freq: Once | INTRAVENOUS | Status: AC
  Administered 2016-10-11: 500 [IU] via INTRAVENOUS
  Filled 2016-10-11: qty 5

## 2016-10-11 MED ORDER — POTASSIUM CHLORIDE CRYS ER 20 MEQ PO TBCR
40.0000 meq | EXTENDED_RELEASE_TABLET | Freq: Every day | ORAL | 0 refills | Status: DC
Start: 2016-10-11 — End: 2016-12-12

## 2016-10-11 MED ORDER — SODIUM CHLORIDE 0.9% FLUSH
10.0000 mL | INTRAVENOUS | Status: DC | PRN
  Administered 2016-10-11: 10 mL via INTRAVENOUS
  Filled 2016-10-11: qty 10

## 2016-10-11 NOTE — Progress Notes (Signed)
Oncology Nurse Navigator Documentation  Oncology Nurse Navigator Flowsheets 10/11/2016  Navigator Location [facility]  Navigator Encounter Type Treatment/spoke with patient and mom today at [facility]  Dr. Julien Nordmann spoke to her about Hospice care.  I spoke with patient about her next steps.  She is not ready to "give up".  She will see Dr. Julien Nordmann in a few weeks to have further discussion.  I will update CSW on Andrea [name] condition.   Treatment Phase Treatment  Barriers/Navigation Needs Education;Coordination of Care  Education Other  Interventions Education;Coordination of Care  Coordination of Care Other  Acuity Level 2  Time Spent with Patient 45

## 2016-10-11 NOTE — Telephone Encounter (Signed)
Notified pt to begin K+ 40 meq daily.

## 2016-10-11 NOTE — Progress Notes (Signed)
Per Dr. Julien Nordmann patient not to receive treatment today. Dr. Julien Nordmann to see patient at bedside before discharge.

## 2016-10-11 NOTE — Telephone Encounter (Signed)
Leaving appts as is per 6/20 los - nothing until 7/5 follow up - no additional appts scheduled . [facility] in [place] - says Threasa Beards is working on referral.

## 2016-10-11 NOTE — Progress Notes (Signed)
[facility] [phone]   [phone]  OFFICE PROGRESS NOTE  [name], MD [place] [address]  DIAGNOSIS: Stage IV (T1a, N2, M1b) non-small cell lung cancer, adenocarcinoma with negative EGFR mutation and presented with a right hilar mass, subcarinal lymphadenopathy as well as a large soft tissue mass in the posterior iliac bone and metastatic bone lesions diagnosed in August 2016.  PRIOR THERAPY: 1) Status post palliative radiotherapy to the soft tissue mass in the left iliac bone under the care of Dr. Valere Dross. 2) Systemic chemotherapy with carboplatin for AUC of 5 and Alimta 500 MG/M2 every 3 weeks. First dose expected on 02/22/2015. Status post 6 cycles. Starting from cycle #3 Alimta was reduced to 400 MG/M2 secondary to neutropenia after the first 2 cycles. 3) Maintenance systemic chemotherapy with single agent Alimta 500 MG/M2 every 3 weeks. First dose 07/19/2015. Status post 3 cycles. Last dose was given 08/30/2015 discontinued secondary to disease progression. 4)  Immunotherapy treatment with Tecentriq (Atezolizumab) 1200 MG IV every 3 weeks, for his dose 10/14/2015. Status post 6 cycles, discontinued secondary to disease progression. 5) whole brain irradiation for multiple new brain metastasis expected to be completed on 02/12/2016. 6) palliative radiotherapy to the progressive bone lesion in the left hip. 7) systemic chemotherapy with docetaxel 75 MG/M2 and Cyramza 10 MG/KG every 3 weeks status post 5 cycles. Discontinued today secondary to disease progression. 8)  systemic chemotherapy with gemcitabine 1000 MG/M2 on days 1 and 8 every 3 weeks. First dose 07/12/2016. Status post 5 cycles. Discontinued today secondary to disease progression  CURRENT THERAPY:  1) Xgeva 120 mg subcutaneously on monthly basis for metastatic bone disease  INTERVAL HISTORY: Andrea [name] 46 y.o. female returns to the clinic today for follow-up  visit accompanied by her mother. The patient continues to complain of increasing fatigue as well as pain in the right hip area. The patient lost few pounds since her last visit. She is currently on Marinol 2.5 mg by mouth twice a day. She denied having any chest pain, shortness of breath, cough or hemoptysis. She denied having any fever or chills. She has no nausea, vomiting, diarrhea or constipation. She has been tolerating her treatment with single agent gemcitabine fairly well. She had repeat CT scan of the chest, abdomen and pelvis performed recently and she is here for evaluation and discussion of her scan results.   MEDICAL HISTORY: Past Medical History:  Diagnosis Date  . 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 Anginal pain ([facility])    chest pain not sure what related to  . Anxiety   . Bone cancer ([place])    lung ca with bone mets  . Chronic back pain   . Depression   . Dysmenorrhea 12/06/2009   Qualifier: Diagnosis of  By: [name] RN, Regino Schultze    . Encounter for antineoplastic immunotherapy 10/07/2015  . Family history of adverse reaction to anesthesia    mother stopped breathing after surgery  . Goals of care, counseling/discussion 05/03/2016  . Headache    stress  . Low back pain radiating to left leg 11/29/2010  . Non-small cell carcinoma of right lung, stage 4 ([place]) 01/01/2015  . S/P radiation therapy 01/01/2015 through 01/21/2015     Left iliac bone 3500 cGy in 14 sessions   . Seizure ([place])   . Shortness of breath dyspnea    with exertion, Pt denies 02/2015    ALLERGIES:  is allergic  to morphine and related and hydrocodone.  MEDICATIONS:  Current Outpatient Prescriptions  Medication Sig Dispense Refill  . diclofenac sodium (VOLTAREN) 1 % GEL Apply 4 g topically 4 (four) times daily. 200 g 3  . dronabinol (MARINOL) 2.5 MG capsule Take 1 capsule (2.5 mg total) by mouth 2 (two) times daily before a meal. 60 capsule 0   . fentaNYL (DURAGESIC - DOSED MCG/HR) 75 MCG/HR Place 1 patch (75 mcg total) onto the skin every 3 (three) days. 10 patch 0  . levETIRAcetam (KEPPRA) 1000 MG tablet Take 1 tablet (1,000 mg total) by mouth 2 (two) times daily. 180 tablet 4  . lidocaine-prilocaine (EMLA) cream Apply 1 teaspoon to skin 1 to 2 hours before use cover to keep in contact with skin 30 g PRN  . loratadine (CLARITIN) 10 MG tablet Take 1 tablet (10 mg total) by mouth daily.    . mirtazapine (REMERON) 30 MG tablet Take 1 tablet (30 mg total) by mouth at bedtime. 30 tablet 1  . Multiple Vitamins-Minerals (MULTIVITAMIN WITH MINERALS) tablet Take by mouth.    . ondansetron (ZOFRAN) 4 MG tablet Take 1 tablet (4 mg total) by mouth every 8 (eight) hours as needed for nausea or vomiting. 20 tablet 0  . oxyCODONE-acetaminophen (PERCOCET) 10-325 MG tablet Take 1 tablet by mouth every 6 (six) hours as needed for pain. 60 tablet 0  . potassium chloride SA (KLOR-CON M20) 20 MEQ tablet Take 2 tablets (40 mEq total) by mouth daily. 20 tablet 0  . senna-docusate (SENOKOT-S) 8.6-50 MG tablet Take 2 tablets by mouth at bedtime. 30 tablet 0   No current facility-administered medications for this visit.     SURGICAL HISTORY:  Past Surgical History:  Procedure Laterality Date  . laproscopy surgery to check her tubes    . LUMBAR LAMINECTOMY/DECOMPRESSION MICRODISCECTOMY Left 05/01/2014   Procedure: Microdiscectomy - L5-S1 - left;  Surgeon: Eustace Moore, MD;  Location: [facility];  Service: Neurosurgery;  Laterality: Left;    REVIEW OF SYSTEMS:  Constitutional: positive for anorexia, fatigue and weight loss Eyes: negative Ears, nose, mouth, throat, and face: negative Respiratory: negative Cardiovascular: negative Gastrointestinal: negative Genitourinary:negative Integument/breast: negative Hematologic/lymphatic: negative Musculoskeletal:positive for bone pain and muscle weakness Neurological: negative Behavioral/Psych:  negative Endocrine: negative Allergic/Immunologic: negative   PHYSICAL EXAMINATION: General appearance: alert, cooperative, fatigued and no distress Head: Normocephalic, without obvious abnormality, atraumatic Neck: no adenopathy, no JVD, supple, symmetrical, trachea midline and thyroid not enlarged, symmetric, no tenderness/mass/nodules Lymph nodes: Cervical, supraclavicular, and axillary nodes normal. Resp: clear to auscultation bilaterally Back: symmetric, no curvature. ROM normal. No CVA tenderness. Cardio: regular rate and rhythm, S1, S2 normal, no murmur, click, rub or gallop GI: soft, non-tender; bowel sounds normal; no masses,  no organomegaly Extremities: extremities normal, atraumatic, no cyanosis or edema Neurologic: Alert and oriented X 3, normal strength and tone. Normal symmetric reflexes. Normal coordination and gait  ECOG PERFORMANCE STATUS: 1 - Symptomatic but completely ambulatory  There were no vitals taken for this visit.  LABORATORY DATA: Lab Results  Component Value Date   WBC 4.5 10/04/2016   HGB 9.7 (L) 10/04/2016   HCT 30.8 (L) 10/04/2016   MCV 97.8 10/04/2016   PLT 433 (H) 10/04/2016      Chemistry      Component Value Date/Time   NA 139 10/04/2016 0825   K 3.8 10/04/2016 0825   CL 108 (H) 05/22/2016 1546   CO2 22 10/04/2016 0825   BUN 3.9 (L) 10/04/2016 0825  CREATININE 0.7 10/04/2016 0825      Component Value Date/Time   CALCIUM 8.7 10/04/2016 0825   ALKPHOS 78 10/04/2016 0825   AST 12 10/04/2016 0825   ALT <6 10/04/2016 0825   BILITOT 0.44 10/04/2016 0825       RADIOGRAPHIC STUDIES: Ct Chest W Contrast  Result Date: 10/10/2016 CLINICAL DATA:  Metastatic non-small-cell lung cancer. EXAM: CT CHEST, ABDOMEN, AND PELVIS WITH CONTRAST TECHNIQUE: Multidetector CT imaging of the chest, abdomen and pelvis was performed following the standard protocol during bolus administration of intravenous contrast. CONTRAST:  <See Chart> ISOVUE-300  IOPAMIDOL (ISOVUE-300) INJECTION 61% COMPARISON:  07/03/2016 FINDINGS: CT CHEST FINDINGS Cardiovascular: No significant vascular findings. Normal heart size. No pericardial effusion. Mediastinum/Nodes: No axillary or supraclavicular adenopathy. No mediastinal adenopathy. Port in the RIGHT chest wall. RIGHT infrahilar hilar lymph node measures 17 mm compared with a 19 mm for no significant change. Lungs/Pleura: No new pulmonary nodules. Musculoskeletal: No aggressive osseous lesion. CT ABDOMEN AND PELVIS FINDINGS Hepatobiliary:  No focal hepatic lesion.  Gallbladder normal. Pancreas: Pancreas is normal. No ductal dilatation. No pancreatic inflammation. Spleen: Normal spleen Adrenals/urinary tract: Bilateral large adrenal glands increased in volume. RIGHT adrenal gland measures 4.8 x 2.5 cm compared to 3.7 x 2.2 cm. LEFT adrenal gland measures 2.8 x 1.9 cm compared with 2.2 x 5 1.5 cm. Kidneys, ureters and bladder normal. Stomach/Bowel: Stomach, small bowel, appendix, and cecum are normal. The colon and rectosigmoid colon are normal. Vascular/Lymphatic: Abdominal aorta is normal caliber. There is no retroperitoneal or periportal lymphadenopathy. No pelvic lymphadenopathy. Reproductive: Uterus and ovaries normal. Other: No free fluid. Musculoskeletal: Sclerotic lesion posterior aspect of the LEFT iliac bone is unchanged at 3.1 mm cm IMPRESSION: Chest Impression: 1. Stable RIGHT infrahilar nodule / lymph node. 2. No evidence disease progression in thorax. Abdomen / Pelvis Impression: 1. Interval enlargement bilateral adrenal lesions. 2. No new lesions in the abdomen pelvis. 3. Stable sclerotic lesion posterior aspect the LEFT iliac bone Electronically Signed   By: Suzy Bouchard M.D.   On: 10/10/2016 09:49   Ct Abdomen Pelvis W Contrast  Result Date: 10/10/2016 CLINICAL DATA:  Metastatic non-small-cell lung cancer. EXAM: CT CHEST, ABDOMEN, AND PELVIS WITH CONTRAST TECHNIQUE: Multidetector CT imaging of the chest,  abdomen and pelvis was performed following the standard protocol during bolus administration of intravenous contrast. CONTRAST:  <See Chart> ISOVUE-300 IOPAMIDOL (ISOVUE-300) INJECTION 61% COMPARISON:  07/03/2016 FINDINGS: CT CHEST FINDINGS Cardiovascular: No significant vascular findings. Normal heart size. No pericardial effusion. Mediastinum/Nodes: No axillary or supraclavicular adenopathy. No mediastinal adenopathy. Port in the RIGHT chest wall. RIGHT infrahilar hilar lymph node measures 17 mm compared with a 19 mm for no significant change. Lungs/Pleura: No new pulmonary nodules. Musculoskeletal: No aggressive osseous lesion. CT ABDOMEN AND PELVIS FINDINGS Hepatobiliary:  No focal hepatic lesion.  Gallbladder normal. Pancreas: Pancreas is normal. No ductal dilatation. No pancreatic inflammation. Spleen: Normal spleen Adrenals/urinary tract: Bilateral large adrenal glands increased in volume. RIGHT adrenal gland measures 4.8 x 2.5 cm compared to 3.7 x 2.2 cm. LEFT adrenal gland measures 2.8 x 1.9 cm compared with 2.2 x 5 1.5 cm. Kidneys, ureters and bladder normal. Stomach/Bowel: Stomach, small bowel, appendix, and cecum are normal. The colon and rectosigmoid colon are normal. Vascular/Lymphatic: Abdominal aorta is normal caliber. There is no retroperitoneal or periportal lymphadenopathy. No pelvic lymphadenopathy. Reproductive: Uterus and ovaries normal. Other: No free fluid. Musculoskeletal: Sclerotic lesion posterior aspect of the LEFT iliac bone is unchanged at 3.1 mm cm IMPRESSION: Chest Impression:  1. Stable RIGHT infrahilar nodule / lymph node. 2. No evidence disease progression in thorax. Abdomen / Pelvis Impression: 1. Interval enlargement bilateral adrenal lesions. 2. No new lesions in the abdomen pelvis. 3. Stable sclerotic lesion posterior aspect the LEFT iliac bone Electronically Signed   By: Suzy Bouchard M.D.   On: 10/10/2016 09:49    ASSESSMENT AND PLAN:  This is a very pleasant 46 years  old African-American female with metastatic non-small cell lung cancer, adenocarcinoma with bone and brain metastasis. The patient underwent several treatments in the past including induction systemic chemotherapy with carboplatin and Alimta discontinued secondary to intolerance followed by maintenance treatment with single agent Alimta discontinued secondary to disease progression followed by treatment with immunotherapy with Tecentriq (Atezolizumab) for 6 cycles discontinued secondary to disease progression. The patient was then started on treatment with docetaxel and Cyramza discontinued secondary to disease progression. She also had palliative radiation to the brain for metastatic disease to the brain. She was treated with systemic chemotherapy with single agent gemcitabine status post 5 cycles discontinued today secondary to disease progression. She had repeat CT scan of the chest, abdomen and pelvis performed recently. I personally and independently reviewed the scan images and discuss the results with the patient and her mother today. Unfortunately her scan showed evidence for disease progression with enlargement of the bilateral adrenal lesions. I had a lengthy discussion with the patient regarding her condition and treatment options. I strongly recommended for her to consider palliative care and hospice referral but the patient is still interested in continuing with further treatment. I will refer the patient to Dr. Tammi Klippel for consideration of palliative radiotherapy to the enlarging metastatic adrenal gland lesions. I would also consider the patient for treatment with single agent Navelbine after completion of the palliative radiotherapy since she refused palliative care and hospice referral. For the bone disease, she will continue her treatment with Xgeva. For pain management the patient will continue her treatment with fentanyl patch and Percocet. For the lack of appetite and weight loss, she  will continue on Marinol 2.5 mg by mouth twice a day For the hypokalemia, I will order potassium chloride 40 mEq by mouth daily for 7 days. I will see her back for follow-up visit in 2 weeks for reevaluation and more detailed discussion of her treatment options. She was advised to call immediately if she has any concerning symptoms in the interval. The patient voices understanding of current disease status and treatment options and is in agreement with the current care plan. All questions were answered. The patient knows to call the clinic with any problems, questions or concerns. We can certainly see the patient much sooner if necessary.  Disclaimer: This note was dictated with voice recognition software. Similar sounding words can inadvertently be transcribed and may not be corrected upon review.

## 2016-10-12 ENCOUNTER — Encounter: Payer: Self-pay | Admitting: Radiation Oncology

## 2016-10-12 ENCOUNTER — Ambulatory Visit
Admission: RE | Admit: 2016-10-12 | Discharge: 2016-10-12 | Disposition: A | Discharge status: Home / Self Care | Payer: Medicaid Other | Source: Ambulatory Visit | Attending: Radiation Oncology | Admitting: Radiation Oncology

## 2016-10-12 VITALS — BP 108/79 | HR 85 | Temp 98.3°F | Resp 16 | Wt 122.0 lb

## 2016-10-12 DIAGNOSIS — R200 Anesthesia of skin: Secondary | ICD-10-CM | POA: Diagnosis not present

## 2016-10-12 DIAGNOSIS — C7971 Secondary malignant neoplasm of right adrenal gland: Secondary | ICD-10-CM | POA: Diagnosis not present

## 2016-10-12 DIAGNOSIS — C7972 Secondary malignant neoplasm of left adrenal gland: Secondary | ICD-10-CM | POA: Diagnosis not present

## 2016-10-12 DIAGNOSIS — Z79899 Other long term (current) drug therapy: Secondary | ICD-10-CM | POA: Diagnosis not present

## 2016-10-12 DIAGNOSIS — M25552 Pain in left hip: Secondary | ICD-10-CM | POA: Insufficient documentation

## 2016-10-12 DIAGNOSIS — Z681 Body mass index (BMI) 19 or less, adult: Secondary | ICD-10-CM | POA: Insufficient documentation

## 2016-10-12 DIAGNOSIS — C3491 Malignant neoplasm of unspecified part of right bronchus or lung: Secondary | ICD-10-CM

## 2016-10-12 DIAGNOSIS — C7970 Secondary malignant neoplasm of unspecified adrenal gland: Secondary | ICD-10-CM

## 2016-10-12 DIAGNOSIS — R630 Anorexia: Secondary | ICD-10-CM | POA: Insufficient documentation

## 2016-10-12 DIAGNOSIS — G893 Neoplasm related pain (acute) (chronic): Secondary | ICD-10-CM | POA: Insufficient documentation

## 2016-10-12 DIAGNOSIS — C7931 Secondary malignant neoplasm of brain: Secondary | ICD-10-CM | POA: Insufficient documentation

## 2016-10-12 DIAGNOSIS — R110 Nausea: Secondary | ICD-10-CM | POA: Diagnosis not present

## 2016-10-12 DIAGNOSIS — E278 Other specified disorders of adrenal gland: Secondary | ICD-10-CM

## 2016-10-12 DIAGNOSIS — Z85118 Personal history of other malignant neoplasm of bronchus and lung: Secondary | ICD-10-CM | POA: Diagnosis not present

## 2016-10-12 DIAGNOSIS — Z885 Allergy status to narcotic agent status: Secondary | ICD-10-CM | POA: Insufficient documentation

## 2016-10-12 DIAGNOSIS — Z923 Personal history of irradiation: Secondary | ICD-10-CM | POA: Diagnosis not present

## 2016-10-12 DIAGNOSIS — R634 Abnormal weight loss: Secondary | ICD-10-CM | POA: Diagnosis not present

## 2016-10-12 DIAGNOSIS — Z9221 Personal history of antineoplastic chemotherapy: Secondary | ICD-10-CM | POA: Diagnosis not present

## 2016-10-12 DIAGNOSIS — C7951 Secondary malignant neoplasm of bone: Secondary | ICD-10-CM | POA: Diagnosis not present

## 2016-10-12 DIAGNOSIS — M791 Myalgia: Secondary | ICD-10-CM | POA: Diagnosis not present

## 2016-10-12 DIAGNOSIS — M545 Low back pain: Secondary | ICD-10-CM | POA: Diagnosis not present

## 2016-10-12 DIAGNOSIS — Z510 Encounter for antineoplastic radiation therapy: Secondary | ICD-10-CM | POA: Insufficient documentation

## 2016-10-12 NOTE — Progress Notes (Signed)
See progress note under physician encounter. 

## 2016-10-12 NOTE — Progress Notes (Signed)
Histology and Location of Primary Cancer: Non small cell lung cancer.  Location(s) of Symptomatic tumor(s): bilateral adrenal mets  Past/Anticipated chemotherapy by medical oncology, if any:  PRIOR THERAPY: 1) Status post palliative radiotherapy to the soft tissue mass in the left iliac bone under the care of Dr. Valere Dross. 2) Systemic chemotherapy with carboplatin for AUC of 5 and Alimta 500 MG/M2 every 3 weeks. First dose expected on 02/22/2015. Status post 6 cycles. Starting from cycle #3 Alimta was reduced to 400 MG/M2 secondary to neutropenia after the first 2 cycles. 3) Maintenance systemic chemotherapy with single agent Alimta 500 MG/M2 every 3 weeks. First dose 07/19/2015. Status post 3 cycles. Last dose was given 08/30/2015 discontinued secondary to disease progression. 4) Immunotherapy treatment with Tecentriq (Atezolizumab) 1200 MG IV every 3 weeks, for his dose 10/14/2015. Status post 6 cycles, discontinued secondary to disease progression. 5) whole brain irradiation for multiple new brain metastasis expected to be completed on 02/12/2016. 6) palliative radiotherapy to the progressive bone lesion in the left hip. 7) systemic chemotherapy with docetaxel 75 MG/M2 and Cyramza 10 MG/KG every 3 weeks status post 5 cycles. Discontinued today secondary to disease progression. 8)  systemic chemotherapy with gemcitabine 1000 MG/M2 on days 1 and 8 every 3 weeks. First dose 07/12/2016. Status post 5 cycles. Discontinued today secondary to disease progression  CURRENT THERAPY:  1) Xgeva 120 mg subcutaneously on monthly basis for metastatic bone disease   Patient's main complaints related to symptomatic tumor(s) are: Patient denies any symptoms associated with adrenal mets.   Pain on a scale of 0-10 is: Reports constant left hip pain 9 on a scale of 0-10. Describes the pain as pressure like pain that is worse with ambulation. Wearing Fentanyl 75 mcg patient but, hasn't taken Percocet 10/325 mg yet  today because she hasn't eaten.     If Spine Met(s), symptoms, if any, include:  Bowel/Bladder retention or incontinence (please describe): Reports when she stands up she get an intense urge to void and occasionally leaks urine before she can get to the restroom. Denies any bowel complaints. Confirms she had a formed bowel movement yesterday.  Numbness or weakness in extremities (please describe): Reports continued numbness in both feet. Reports lower extremity weakness worse in the left leg than the right.   Current Decadron regimen, if applicable: no  Ambulatory status? Walker? Wheelchair?: ambulatory with the aid of a walker  SAFETY ISSUES:  Prior radiation? yes  Pacemaker/ICD? no  Possible current pregnancy? no  Is the patient on methotrexate? no  Additional Complaints / other details:  46 year old female. Accompanied today by her mother. Patient reports intermittent vomiting despite Zofran. Denies headache or dizziness. Reports muffled hearing but, denies tinnitus. Reports blurry vision. Reports her walker is adjusted to the highest level but, is too short. Will place ambulatory referral to home health for an evaluation of needs and to replace walker. Reports weight loss from 136 lb to 122 lb (14 lb) in one month. Reports pain keeps her from eating a lot along with taste changes. Reports increasing fatigue. Edema of left knee noted. Reports coughing up blood on occasion. Reports Dr. Julien Nordmann stopped chemotherapy yesterday. Reports shortness of breath with exertion. Reports numbness in both feet. Denies any seizure activity. Reports chest pain.

## 2016-10-12 NOTE — Progress Notes (Signed)
Radiation Oncology         [phone] ________________________________  Name: Andrea [name] MRN: [id number]  Date: 10/12/2016  DOB: [birth year]  Re-Consultation Visit Note  CC: [name], MD  Curt Bears, MD  Diagnosis:  Stage IV (T1a, N2, M1b) non-small cell lung cancer, adenocarcinoma, with brain and bone metastases     ICD-10-CM   1. 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 Metastasis to brain [facility]) C79.31 Ambulatory referral to Home Health  3. Adrenal mass, left ([place]) E27.9 Ambulatory referral to Home Health  4. 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 Non-small cell carcinoma of right lung, stage 4 ([facility]) C34.91 Ambulatory referral to [place]  6. Bone metastases ([place]) C79.51 Ambulatory referral to Home Health    Interval Since Last Radiation:  5 months  04/19/16 - 05/03/16 : The Left S-I region including proximal ilium was re-treated to 30 Gy in 10 fractions at 3 Gy per fraction. 02/03/16-02/16/16: The whole brain was treated to 30 Gy in 10 fractions of 3 Gy 01/01/2015 through 01/21/2015: Left iliac bone 3500 cGy in 14 sessions  Narrative:  The patient returns today for a re-consulation uat the request of Dr. Julien Nordmann for consideration of radiotherapy to metastatic adrenal lesions. She presents today with her mother. In summary, she has a history of Stage IV (T1a, N2, M1b) non-small cell lung cancer, adenocarcinoma of the right lung and involving the left posterior iliac bone at presentation. The patient received palliative radiotherapy up front to the left iliac region, and went on to receive systemic therapy with taxol/carboplatin, then alimta for maintenance. She developed disease progression on this regimen and was changed to immunotherapy with Tecentriq in June 2017. She completed 5 cycles, and was seen on 01/24/16 with the intention of continuation of  immunotherapy. She developed seizure like activity on three occasions and was seen in symptom management. A stat brain MRI on 02/01/16 revealed a 3 cm lobulated mass in the anterior left frontal lobe with a large area of surrounding vasogenic edema and rightward midline shift up to 10 mm. The smallest brain metastases were punctate, two 1.4-1.5 cm metastases in the right parietal lobe and right posterior operculum, and three small left cerebellar metastases.  She went on to complete whole brain radiotherapy in 01/2016. She was maintained on systemic therapy with taxotere/cyramza with Dr. Julien Nordmann. She presented to the office in 03/2016 for reconsultation due to progressive disease in the left ilium measured at 2.1 x 2.5 x 3 cm, and adjacent left posterior sacral disease measured at 2.5 cm x 1.5 cm.  She was treated with a second course of 10 fractions of palliative radiotherapy to the left hip at that time- completed palliative XRT 05/03/16.   She completed 5 cycles of systemic chemotherapy with Taxotere/cyramza but this was discontinued 07/05/16 due to disease progression in the chest as well as bilateral adrenal glands and bone.  Her systemic therapy was changed to single agent gemcitabine at that time.  She completed 5 cycles of single agent gemcitabine but was recently discontinued due to disease progression on follow up CT imaging from 10/09/16.  Restaging CT C/A/P on 10/09/16 showed no evidence of disease progression in the thorax and no new lesions in the abdomen/pelvis, but it did show interval enlargement of bilateral adrenal lesions. The patient had a follow up visit with Dr. Julien Nordmann on 10/12/16 and he discussed palliative care and hospice  referral, but the patient is still interested in continuing further treatment. She was referred today for consideration of palliative radiotherapy to the enlarging bilateral metastatic adrenal gland lesions. He discontinued her current systemic chemotherapy regimen and plans  to change to single agent Navelbine after completion of palliative radiotherapy . She will continue treatment with Xgeva for bone disease.  On review of systems: The patient denies chest pain, increased SOB, cough, or hemoptysis. The patient reports chronic nausea, loss of appetite, weight loss, and lower back pain.  She has chronic left lateral hip/buttock pain which has increased recently.  She uses a walker for ambulation which she reports is "too low" despite being adjusted to the highest setting and reports falling on 2 separate occasions within the past 3 weeks.  Most recently, on Monday of this week, she fell onto a shopping cart and hit her left buttock/hip. She has had increased pain in this area since that time and is unable to sit or lay on her left side due to pain.  She denies numbness, tingling or radiation of pain into the LEs. She has chronic numbness in her feet bilaterally which is unchanged recently. She reports increased pain/pressure with ambulation and weightbearing as well as with sitting or lying on the left side.  She denies tinnitus, headaches, seizure activity, dizziness or balance issues.  She has decreased appetite due to taste changes associated with chemotherapy.  She has also lost 10 lbs in the last month which she attributes to decreased appetite and N/V associated with chemotherapy.  ALLERGIES:  is allergic to morphine and related and hydrocodone.  Meds: Current Outpatient Prescriptions  Medication Sig Dispense Refill  . diclofenac sodium (VOLTAREN) 1 % GEL Apply 4 g topically 4 (four) times daily. 200 g 3  . dronabinol (MARINOL) 2.5 MG capsule Take 1 capsule (2.5 mg total) by mouth 2 (two) times daily before a meal. 60 capsule 0  . fentaNYL (DURAGESIC - DOSED MCG/HR) 75 MCG/HR Place 1 patch (75 mcg total) onto the skin every 3 (three) days. 10 patch 0  . lidocaine-prilocaine (EMLA) cream Apply 1 teaspoon to skin 1 to 2 hours before use cover to keep in contact with skin  30 g PRN  . loratadine (CLARITIN) 10 MG tablet Take 1 tablet (10 mg total) by mouth daily.    . mirtazapine (REMERON) 30 MG tablet Take 1 tablet (30 mg total) by mouth at bedtime. 30 tablet 1  . Multiple Vitamins-Minerals (MULTIVITAMIN WITH MINERALS) tablet Take by mouth.    . ondansetron (ZOFRAN) 4 MG tablet Take 1 tablet (4 mg total) by mouth every 8 (eight) hours as needed for nausea or vomiting. 20 tablet 0  . oxyCODONE-acetaminophen (PERCOCET) 10-325 MG tablet Take 1 tablet by mouth every 6 (six) hours as needed for pain. 60 tablet 0  . potassium chloride SA (KLOR-CON M20) 20 MEQ tablet Take 2 tablets (40 mEq total) by mouth daily. 14 tablet 0  . senna-docusate (SENOKOT-S) 8.6-50 MG tablet Take 2 tablets by mouth at bedtime. 30 tablet 0  . levETIRAcetam (KEPPRA) 1000 MG tablet Take 1 tablet (1,000 mg total) by mouth 2 (two) times daily. 180 tablet 4   No current facility-administered medications for this encounter.     Physical Findings:  weight is 122 lb (55.3 kg). Her oral temperature is 98.3 F (36.8 C). Her blood pressure is 108/79 and her pulse is 85. Her respiration is 16 and oxygen saturation is 100%.  The patient was lying down in  a hospital bed. In general this is a chronically ill appearing African-American female in no acute distress. However the patient is unable to sit or lie supine due to left hip/buttock pain. Therefore she presents lying prone on a stretcher for comfort. She's alert and oriented x4 and appropriate throughout the examination. Cardiopulmonary assessment is negative for acute distress and she exhibits normal respiratory effort. She has exquisite pain to light palpation over the left lateral hip and posterior buttock.   Lab Findings: Lab Results  Component Value Date   WBC 2.7 (L) 10/11/2016   WBC 2.9 (L) 03/08/2015   HGB 9.5 (L) 10/11/2016   HCT 28.6 (L) 10/11/2016   PLT 338 10/11/2016    Lab Results  Component Value Date   NA 136 10/11/2016   K 2.9  (LL) 10/11/2016   CHLORIDE 100 10/11/2016   CO2 25 10/11/2016   GLUCOSE 116 10/11/2016   BUN <2.0 (L) 10/11/2016   CREATININE 0.7 10/11/2016   BILITOT 0.29 10/11/2016   ALKPHOS 94 10/11/2016   AST 34 10/11/2016   ALT 17 10/11/2016   PROT 7.1 10/11/2016   ALBUMIN 2.6 (L) 10/11/2016   CALCIUM 8.9 10/11/2016   ANIONGAP 12 (H) 10/11/2016   ANIONGAP 8 03/08/2015    Radiographic Findings: Ct Chest W Contrast  Result Date: 10/10/2016 CLINICAL DATA:  Metastatic non-small-cell lung cancer. EXAM: CT CHEST, ABDOMEN, AND PELVIS WITH CONTRAST TECHNIQUE: Multidetector CT imaging of the chest, abdomen and pelvis was performed following the standard protocol during bolus administration of intravenous contrast. CONTRAST:  <See Chart> ISOVUE-300 IOPAMIDOL (ISOVUE-300) INJECTION 61% COMPARISON:  07/03/2016 FINDINGS: CT CHEST FINDINGS Cardiovascular: No significant vascular findings. Normal heart size. No pericardial effusion. Mediastinum/Nodes: No axillary or supraclavicular adenopathy. No mediastinal adenopathy. Port in the RIGHT chest wall. RIGHT infrahilar hilar lymph node measures 17 mm compared with a 19 mm for no significant change. Lungs/Pleura: No new pulmonary nodules. Musculoskeletal: No aggressive osseous lesion. CT ABDOMEN AND PELVIS FINDINGS Hepatobiliary:  No focal hepatic lesion.  Gallbladder normal. Pancreas: Pancreas is normal. No ductal dilatation. No pancreatic inflammation. Spleen: Normal spleen Adrenals/urinary tract: Bilateral large adrenal glands increased in volume. RIGHT adrenal gland measures 4.8 x 2.5 cm compared to 3.7 x 2.2 cm. LEFT adrenal gland measures 2.8 x 1.9 cm compared with 2.2 x 5 1.5 cm. Kidneys, ureters and bladder normal. Stomach/Bowel: Stomach, small bowel, appendix, and cecum are normal. The colon and rectosigmoid colon are normal. Vascular/Lymphatic: Abdominal aorta is normal caliber. There is no retroperitoneal or periportal lymphadenopathy. No pelvic lymphadenopathy.  Reproductive: Uterus and ovaries normal. Other: No free fluid. Musculoskeletal: Sclerotic lesion posterior aspect of the LEFT iliac bone is unchanged at 3.1 mm cm IMPRESSION: Chest Impression: 1. Stable RIGHT infrahilar nodule / lymph node. 2. No evidence disease progression in thorax. Abdomen / Pelvis Impression: 1. Interval enlargement bilateral adrenal lesions. 2. No new lesions in the abdomen pelvis. 3. Stable sclerotic lesion posterior aspect the LEFT iliac bone Electronically Signed   By: Suzy Bouchard M.D.   On: 10/10/2016 09:49   Ct Abdomen Pelvis W Contrast  Result Date: 10/10/2016 CLINICAL DATA:  Metastatic non-small-cell lung cancer. EXAM: CT CHEST, ABDOMEN, AND PELVIS WITH CONTRAST TECHNIQUE: Multidetector CT imaging of the chest, abdomen and pelvis was performed following the standard protocol during bolus administration of intravenous contrast. CONTRAST:  <See Chart> ISOVUE-300 IOPAMIDOL (ISOVUE-300) INJECTION 61% COMPARISON:  07/03/2016 FINDINGS: CT CHEST FINDINGS Cardiovascular: No significant vascular findings. Normal heart size. No pericardial effusion. Mediastinum/Nodes: No axillary or supraclavicular  adenopathy. No mediastinal adenopathy. Port in the RIGHT chest wall. RIGHT infrahilar hilar lymph node measures 17 mm compared with a 19 mm for no significant change. Lungs/Pleura: No new pulmonary nodules. Musculoskeletal: No aggressive osseous lesion. CT ABDOMEN AND PELVIS FINDINGS Hepatobiliary:  No focal hepatic lesion.  Gallbladder normal. Pancreas: Pancreas is normal. No ductal dilatation. No pancreatic inflammation. Spleen: Normal spleen Adrenals/urinary tract: Bilateral large adrenal glands increased in volume. RIGHT adrenal gland measures 4.8 x 2.5 cm compared to 3.7 x 2.2 cm. LEFT adrenal gland measures 2.8 x 1.9 cm compared with 2.2 x 5 1.5 cm. Kidneys, ureters and bladder normal. Stomach/Bowel: Stomach, small bowel, appendix, and cecum are normal. The colon and rectosigmoid colon  are normal. Vascular/Lymphatic: Abdominal aorta is normal caliber. There is no retroperitoneal or periportal lymphadenopathy. No pelvic lymphadenopathy. Reproductive: Uterus and ovaries normal. Other: No free fluid. Musculoskeletal: Sclerotic lesion posterior aspect of the LEFT iliac bone is unchanged at 3.1 mm cm IMPRESSION: Chest Impression: 1. Stable RIGHT infrahilar nodule / lymph node. 2. No evidence disease progression in thorax. Abdomen / Pelvis Impression: 1. Interval enlargement bilateral adrenal lesions. 2. No new lesions in the abdomen pelvis. 3. Stable sclerotic lesion posterior aspect the LEFT iliac bone Electronically Signed   By: Suzy Bouchard M.D.   On: 10/10/2016 09:49    Impression:  The patient has progressive metastatic disease with interval enlargement of bilateral adrenal lesions on recent CT C/A/P from 10/09/16.  PLAN: Today, we discussed the findings and work-up thus far with the patient and her mother. We discussed the natural history of metastatic lung cancer to the adrenal glands and general treatment. We will plan to discuss her case with Dr. Julien Nordmann regarding the decision to proceed with palliative radiation to the adrenal metastases prior to changing systemic therapies or hold off to see if she has a response to the new systemic therapy and offer palliative radiotherapy only if there is further progression or no response. We will call the patient with recommendations following discussion with Dr. Julien Nordmann.  In regards to the left hip pain, we discussed with the patient that there is no role for further palliative radiation to the left hip due to the fact that she has received radiotherapy to this area on 2 previous occasions, most recently 04/2016. However, we will consult with Interventional Radiology regarding a potential role for OsteoCool procedure to the left iliac bone for palliation/pain control and will schedule accordingly if this is an option.    We spent 30 minutes  minutes face to face with the patient and more than 50% of that time was spent in counseling and/or coordination of care. _____________________________________   Nicholos Johns, PA-C    Tyler Pita, MD  [place]: [phone]  Fax: [phone] [email or web address]  Skype  [email or web address]  This document serves as a record of services personally performed by Freeman Caldron, PA-C and Tyler Pita, MD. It was created on their behalf by Darcus Austin, a trained medical scribe. The creation of this record is based on the scribe's personal observations and the providers' statements to them. This document has been checked and approved by the attending provider.

## 2016-10-17 ENCOUNTER — Telehealth: Payer: Self-pay | Admitting: *Deleted

## 2016-10-17 ENCOUNTER — Other Ambulatory Visit: Payer: Self-pay | Admitting: Medical Oncology

## 2016-10-17 DIAGNOSIS — C7951 Secondary malignant neoplasm of bone: Secondary | ICD-10-CM

## 2016-10-17 DIAGNOSIS — C3490 Malignant neoplasm of unspecified part of unspecified bronchus or lung: Secondary | ICD-10-CM

## 2016-10-17 DIAGNOSIS — E46 Unspecified protein-calorie malnutrition: Secondary | ICD-10-CM

## 2016-10-17 MED ORDER — OXYCODONE-ACETAMINOPHEN 10-325 MG PO TABS: 1.0000 | ORAL_TABLET | Freq: Four times a day (QID) | ORAL | 0 refills | Status: DC | PRN

## 2016-10-17 MED ORDER — DRONABINOL 2.5 MG PO CAPS: 2.5000 mg | ORAL_CAPSULE | Freq: Two times a day (BID) | ORAL | 0 refills | Status: DC

## 2016-10-17 NOTE — Telephone Encounter (Signed)
FAXED [facility] @ [facility] DECLINING THIS REFERRAL, DUE TO NOT HAVING A NURSE AVAILABLE

## 2016-10-17 NOTE — Telephone Encounter (Signed)
"  I have not heard anything about scans to be done before my appointment October 26, 2016.  Dr. Johny Shears instructions were to call Dr. Julien Nordmann if I had not heard from scheduling yet.  I also need refills for Percocet and Marinol tomorrow.  Call me at [phone]."

## 2016-10-18 ENCOUNTER — Telehealth: Payer: Self-pay | Admitting: *Deleted

## 2016-10-18 NOTE — Telephone Encounter (Signed)
Refills in notebook. Note to mohamed re scan.

## 2016-10-18 NOTE — Telephone Encounter (Signed)
Oncology Nurse Navigator Documentation  Oncology Nurse Navigator Flowsheets 10/18/2016  Navigator Location [facility]  Navigator Encounter Type Telephone/I followed up with Dr. Julien Nordmann regarding Andrea [name] schedule.  He states patient appts on 10/26/16 should be cancelled. She needs to be set up for IR.  I contacted Rad Onc, Dr Tammi Klippel to get an update on IR.  He is still waiting on follow up.  I called and left patient message that appts on 10/26/16 are cancelled and we are still waiting on IR to get back with [place] about procedure.  I also left my name and phone number to call.   Telephone Outgoing Call  Treatment Phase Other  Barriers/Navigation Needs Education  Education Other  Interventions Education  Coordination of Care Other;Appts  Acuity Level 2  Time Spent with Patient 30

## 2016-10-19 ENCOUNTER — Telehealth: Payer: Self-pay | Admitting: Medical Oncology

## 2016-10-19 ENCOUNTER — Other Ambulatory Visit: Payer: Self-pay | Admitting: Medical Oncology

## 2016-10-19 MED FILL — OXYCODONE-ACETAMINOPHEN 10-: 10-325 | 15 days supply | Qty: 60 | Fill #0

## 2016-10-19 MED FILL — DRONABINOL 2.5 MG CAP: 2.5 | 30 days supply | Qty: 60 | Fill #0

## 2016-10-19 NOTE — Telephone Encounter (Signed)
  Pt notified re scans. I also told her she had 2 rx to pick up.

## 2016-10-19 NOTE — Telephone Encounter (Signed)
-----   Message from Curt Bears, MD sent at 10/18/2016  5:25 PM EDT ----- Regarding: RE: ?scan No more scans. She just had one recently. ----- Message ----- From: Ardeen Garland, RN Sent: 10/17/2016   4:28 PM To: Curt Bears, MD Subject: ?scan                                          Pt thinks she is supposed to have an imaging test before she sees you in July. She had ct scan recently . I see a pending order for MRI pelvis of aug. IS that correct?

## 2016-10-23 ENCOUNTER — Telehealth: Payer: Self-pay

## 2016-10-23 ENCOUNTER — Encounter: Payer: Self-pay | Admitting: *Deleted

## 2016-10-23 NOTE — Progress Notes (Signed)
Oncology Nurse Navigator Documentation  Oncology Nurse Navigator Flowsheets 10/23/2016  Navigator Location [facility]  Navigator Encounter Type Other/I noticed Ms. Blecha has not been scheduled for rad onc.  I contacted rad onc schedulers to update.   Barriers/Navigation Needs Coordination of Care  Interventions Coordination of Care  Acuity Level 2  Time Spent with Patient 15

## 2016-10-23 NOTE — Telephone Encounter (Signed)
Talmadge Chad RN with [facility] called. Pt has had falls at home. Pt is requesting a rollator walker with a seat. [facility] # [phone]

## 2016-10-23 NOTE — Telephone Encounter (Signed)
Called pt to check on her status, and reminder not to come on July 12 as she does not have any appointments until 7/26. Pt unavailable lmovm to call office.

## 2016-10-26 ENCOUNTER — Other Ambulatory Visit: Payer: Medicaid Other

## 2016-10-26 ENCOUNTER — Ambulatory Visit: Payer: Medicaid Other

## 2016-10-26 ENCOUNTER — Ambulatory Visit: Payer: Medicaid Other | Admitting: Internal Medicine

## 2016-10-30 ENCOUNTER — Ambulatory Visit
Admission: RE | Admit: 2016-10-30 | Discharge: 2016-10-30 | Disposition: A | Discharge status: Home / Self Care | Payer: Medicaid Other | Source: Ambulatory Visit | Attending: Internal Medicine | Admitting: Internal Medicine

## 2016-10-30 ENCOUNTER — Ambulatory Visit (INDEPENDENT_AMBULATORY_CARE_PROVIDER_SITE_OTHER): Payer: Medicaid Other | Admitting: *Deleted

## 2016-10-30 ENCOUNTER — Telehealth: Payer: Self-pay | Admitting: Urology

## 2016-10-30 ENCOUNTER — Other Ambulatory Visit: Payer: Self-pay | Admitting: Radiation Oncology

## 2016-10-30 DIAGNOSIS — R928 Other abnormal and inconclusive findings on diagnostic imaging of breast: Secondary | ICD-10-CM

## 2016-10-30 DIAGNOSIS — Z3042 Encounter for surveillance of injectable contraceptive: Secondary | ICD-10-CM

## 2016-10-30 DIAGNOSIS — N946 Dysmenorrhea, unspecified: Secondary | ICD-10-CM

## 2016-10-30 DIAGNOSIS — C3400 Malignant neoplasm of unspecified main bronchus: Secondary | ICD-10-CM

## 2016-10-30 DIAGNOSIS — G893 Neoplasm related pain (acute) (chronic): Secondary | ICD-10-CM

## 2016-10-30 DIAGNOSIS — C7951 Secondary malignant neoplasm of bone: Secondary | ICD-10-CM

## 2016-10-30 MED ORDER — MEDROXYPROGESTERONE ACETATE 150 MG/ML IM SUSP
150.0000 mg | Freq: Once | INTRAMUSCULAR | Status: AC
  Administered 2016-10-30: 150 mg via INTRAMUSCULAR

## 2016-10-30 NOTE — Telephone Encounter (Addendum)
I spoke with Dr. Pascal Lux regarding the patient's case and consideration for OsteoCool procedure for palliation of painful bony metastasis to the left iliac bone.  He has reviewed the imaging studies and workup to date and agrees that she would be a good candidate for this procedure to help reduce her bone pain in this area.  He is willing to work the patient into clinic tomorrow.  I spoke with Vickie at [facility] and she has tentatively scheduled the patient for a 3:15pm consult with Dr. Pascal Lux on 10/31/16.  She will call the patient to confirm the appointment date/time and provide directions to their office. We will plan for CT SIM bilateral adrenal metastases once her procedure has been completed.

## 2016-10-31 ENCOUNTER — Other Ambulatory Visit (HOSPITAL_COMMUNITY): Payer: Self-pay | Admitting: Interventional Radiology

## 2016-10-31 ENCOUNTER — Ambulatory Visit
Admission: RE | Admit: 2016-10-31 | Discharge: 2016-10-31 | Disposition: A | Discharge status: Home / Self Care | Payer: Medicaid Other | Source: Ambulatory Visit | Attending: Radiation Oncology | Admitting: Radiation Oncology

## 2016-10-31 DIAGNOSIS — M899 Disorder of bone, unspecified: Secondary | ICD-10-CM

## 2016-10-31 DIAGNOSIS — C7951 Secondary malignant neoplasm of bone: Secondary | ICD-10-CM

## 2016-10-31 DIAGNOSIS — C3400 Malignant neoplasm of unspecified main bronchus: Secondary | ICD-10-CM

## 2016-10-31 DIAGNOSIS — M898X9 Other specified disorders of bone, unspecified site: Secondary | ICD-10-CM

## 2016-10-31 HISTORY — PX: IR RADIOLOGIST EVAL & MGMT: IMG5224

## 2016-10-31 MED FILL — diazePAM 5 MG TABS: 5 | 1 days supply | Qty: 3 | Fill #0

## 2016-10-31 NOTE — Consult Note (Signed)
Chief Complaint  Patient presents with  . Advice Only    Consult for Osteocool of Left Iliac Metastases   at the request of Manning,Matthew  Referring Physician(s): Manning,Matthew  History of Present Illness: Andrea [name] is a 46 y.o. 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 Patient has undergone 2 previous sessions of radiotherapy to this area and as such, presents today for evaluation of potential osteocool for palliative purposes. The patient is unaccompanied and serves as her own historian.  Patient states that she has chronic left hip/pelvic pain which fluctuates between 3-5 / 10. Unfortunately, the patient experienced a fall approximately 3-4 weeks ago (following the most recent CT scan of the chest, abdomen and pelvis performed 10/09/2016) and since that time has experienced significant worsening of her hip and pelvic pain which she currently rates as 9-10 / 10.   Patient has been taking her prescribed course of Percocet however states this medicine only makes her sleepy and does little to alleviate her pain.  Of note, patient also reports development of a painful subcutaneous nodule within her left flank which she noticed late last week.  The patient lives alone though has a home health aid for 3 hours per day. She needs assistance with bathing and dressing, however is able to ambulate with a cane and a walker.  The patient is interested in pursuing all potential treatment options to alleviate her hip and pelvic discomfort.  Note, I placed the patient's portacatheter on 03/08/2015 and my partner, Dr. Vernard Gambles, performed CT-guided biopsy of this lytic lesion on 12/21/2014.    Past Medical History:    Diagnosis Date  . 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 Anginal pain ([place])    chest pain not sure what related to  . Anxiety   . Bone cancer ([place])    lung ca with bone mets  . Chronic back pain   . Depression   . Dysmenorrhea 12/06/2009   Qualifier: Diagnosis of  By: [name] RN, Regino Schultze    . Encounter for antineoplastic immunotherapy 10/07/2015  . Family history of adverse reaction to anesthesia    mother stopped breathing after surgery  . Goals of care, counseling/discussion 05/03/2016  . Headache    stress  . Low back pain radiating to left leg 11/29/2010  . Non-small cell carcinoma of right lung, stage 4 ([place]) 01/01/2015  . S/P radiation therapy 01/01/2015 through 01/21/2015     Left iliac bone 3500 cGy in 14 sessions   . Seizure ([place])   . Shortness of breath dyspnea    with exertion, Pt denies 02/2015    Past Surgical History:  Procedure Laterality Date  . laproscopy surgery to check her tubes    . LUMBAR LAMINECTOMY/DECOMPRESSION MICRODISCECTOMY Left 05/01/2014   Procedure: Microdiscectomy - L5-S1 - left;  Surgeon: Eustace Moore, MD;  Location: [facility];  Service: Neurosurgery;  Laterality: Left;    Allergies: Morphine and related and Hydrocodone  Medications: Prior to Admission medications   Medication Sig Start Date End Date Taking? Authorizing Provider  diclofenac sodium (VOLTAREN) 1 % GEL Apply 4 g topically 4 (four) times daily. 09/29/16   [name], MD  dronabinol (MARINOL) 2.5 MG capsule Take 1 capsule (2.5 mg total) by mouth 2 (two) times  daily before a meal. 10/17/16   Curt Bears, MD  fentaNYL (DURAGESIC - DOSED MCG/HR) 75 MCG/HR Place 1 patch (75 mcg total) onto the skin every 3 (three) days. 10/04/16   Curt Bears, MD  levETIRAcetam (KEPPRA) 1000 MG tablet Take 1 tablet (1,000 mg total) by mouth 2 (two) times daily. 08/24/16 09/23/16  Marcial Pacas, MD  lidocaine-prilocaine (EMLA) cream Apply 1  teaspoon to skin 1 to 2 hours before use cover to keep in contact with skin 09/13/16   Curt Bears, MD  loratadine (CLARITIN) 10 MG tablet Take 1 tablet (10 mg total) by mouth daily. 02/05/16   Johnson, Clanford L, MD  mirtazapine (REMERON) 30 MG tablet Take 1 tablet (30 mg total) by mouth at bedtime. 09/13/16   Curt Bears, MD  Multiple Vitamins-Minerals (MULTIVITAMIN WITH MINERALS) tablet Take by mouth.    [provider]  ondansetron (ZOFRAN) 4 MG tablet Take 1 tablet (4 mg total) by mouth every 8 (eight) hours as needed for nausea or vomiting. 09/20/16   Curt Bears, MD  oxyCODONE-acetaminophen (PERCOCET) 10-325 MG tablet Take 1 tablet by mouth every 6 (six) hours as needed for pain. 10/17/16   Curt Bears, MD  potassium chloride SA (KLOR-CON M20) 20 MEQ tablet Take 2 tablets (40 mEq total) by mouth daily. 10/11/16 10/18/16  Curt Bears, MD  senna-docusate (SENOKOT-S) 8.6-50 MG tablet Take 2 tablets by mouth at bedtime. 05/24/16   Curt Bears, MD     Family History  Problem Relation Age of Onset  . Diabetes Mother   . Hypertension Mother   . Hypertension Father   . Heart attack Maternal Grandfather   . Hypertension Maternal Grandfather   . Heart attack Paternal Grandfather   . Hypertension Paternal Grandfather   . Breast cancer Maternal Aunt   . Breast cancer Maternal Aunt     Social History   Social History  . Marital status: Single    Spouse name: N/A  . Number of children: 0  . Years of education: Associates   Occupational History  . Unemployed    Social History Main Topics  . Smoking status: Former Smoker    Packs/day: 0.50    Years: 25.00    Types: Cigarettes, E-cigarettes    Quit date: 12/29/2014  . Smokeless tobacco: Never Used     Comment: Smoking very little.  . Alcohol use 0.0 oz/week     Comment: Rarely  . Drug use: No     Comment: previoulsly.  [name] Sexual activity: Not Currently   Other Topics Concern  . Not on file    Social History Narrative   Lives at home alone.   Right-handed.   No caffeine use.    ECOG Status: 2 - Symptomatic, <50% confined to bed  Review of Systems: A 12 point ROS discussed and pertinent positives are indicated in the HPI above.  All other systems are negative.  Review of Systems  Constitutional: Negative for activity change.  Genitourinary: Positive for flank pain.       Left-sided flank pain associated with a palpable painful subcutaneous nodule  Musculoskeletal: Positive for gait problem.  Skin: Negative.   Psychiatric/Behavioral: Negative.     Vital Signs: Pulse 89   Temp 97.7 F (36.5 C) (Oral)   Resp 15   Ht 5\' 6"  (1.676 m)   Wt 120 lb (54.4 kg)   LMP  (LMP Unknown)   SpO2 99%   BMI 19.37 kg/m   Physical Exam  Constitutional: She appears  well-developed and well-nourished.  HENT:  Head: Normocephalic and atraumatic.  Abdominal:  Patient is tender with palpation of an indeterminate subcutaneous nodule along the left flank.  No associated erythema or fluctuance.  Musculoskeletal: She exhibits tenderness.  Patient with tenderness with direct palpation of the posterior aspect of the left ilium.  Skin:  The skin is darkened and discolored about the left hip, likely the sequela of prior radiation therapy at this location.  Psychiatric: She has a normal mood and affect. Her behavior is normal.     Imaging: Ct Chest W Contrast  Result Date: 10/10/2016 CLINICAL DATA:  Metastatic non-small-cell lung cancer. EXAM: CT CHEST, ABDOMEN, AND PELVIS WITH CONTRAST TECHNIQUE: Multidetector CT imaging of the chest, abdomen and pelvis was performed following the standard protocol during bolus administration of intravenous contrast. CONTRAST:  <See Chart> ISOVUE-300 IOPAMIDOL (ISOVUE-300) INJECTION 61% COMPARISON:  07/03/2016 FINDINGS: CT CHEST FINDINGS Cardiovascular: No significant vascular findings. Normal heart size. No pericardial effusion. Mediastinum/Nodes: No  axillary or supraclavicular adenopathy. No mediastinal adenopathy. Port in the RIGHT chest wall. RIGHT infrahilar hilar lymph node measures 17 mm compared with a 19 mm for no significant change. Lungs/Pleura: No new pulmonary nodules. Musculoskeletal: No aggressive osseous lesion. CT ABDOMEN AND PELVIS FINDINGS Hepatobiliary:  No focal hepatic lesion.  Gallbladder normal. Pancreas: Pancreas is normal. No ductal dilatation. No pancreatic inflammation. Spleen: Normal spleen Adrenals/urinary tract: Bilateral large adrenal glands increased in volume. RIGHT adrenal gland measures 4.8 x 2.5 cm compared to 3.7 x 2.2 cm. LEFT adrenal gland measures 2.8 x 1.9 cm compared with 2.2 x 5 1.5 cm. Kidneys, ureters and bladder normal. Stomach/Bowel: Stomach, small bowel, appendix, and cecum are normal. The colon and rectosigmoid colon are normal. Vascular/Lymphatic: Abdominal aorta is normal caliber. There is no retroperitoneal or periportal lymphadenopathy. No pelvic lymphadenopathy. Reproductive: Uterus and ovaries normal. Other: No free fluid. Musculoskeletal: Sclerotic lesion posterior aspect of the LEFT iliac bone is unchanged at 3.1 mm cm IMPRESSION: Chest Impression: 1. Stable RIGHT infrahilar nodule / lymph node. 2. No evidence disease progression in thorax. Abdomen / Pelvis Impression: 1. Interval enlargement bilateral adrenal lesions. 2. No new lesions in the abdomen pelvis. 3. Stable sclerotic lesion posterior aspect the LEFT iliac bone Electronically Signed   By: Suzy Bouchard M.D.   On: 10/10/2016 09:49   Ct Abdomen Pelvis W Contrast  Result Date: 10/10/2016 CLINICAL DATA:  Metastatic non-small-cell lung cancer. EXAM: CT CHEST, ABDOMEN, AND PELVIS WITH CONTRAST TECHNIQUE: Multidetector CT imaging of the chest, abdomen and pelvis was performed following the standard protocol during bolus administration of intravenous contrast. CONTRAST:  <See Chart> ISOVUE-300 IOPAMIDOL (ISOVUE-300) INJECTION 61% COMPARISON:   07/03/2016 FINDINGS: CT CHEST FINDINGS Cardiovascular: No significant vascular findings. Normal heart size. No pericardial effusion. Mediastinum/Nodes: No axillary or supraclavicular adenopathy. No mediastinal adenopathy. Port in the RIGHT chest wall. RIGHT infrahilar hilar lymph node measures 17 mm compared with a 19 mm for no significant change. Lungs/Pleura: No new pulmonary nodules. Musculoskeletal: No aggressive osseous lesion. CT ABDOMEN AND PELVIS FINDINGS Hepatobiliary:  No focal hepatic lesion.  Gallbladder normal. Pancreas: Pancreas is normal. No ductal dilatation. No pancreatic inflammation. Spleen: Normal spleen Adrenals/urinary tract: Bilateral large adrenal glands increased in volume. RIGHT adrenal gland measures 4.8 x 2.5 cm compared to 3.7 x 2.2 cm. LEFT adrenal gland measures 2.8 x 1.9 cm compared with 2.2 x 5 1.5 cm. Kidneys, ureters and bladder normal. Stomach/Bowel: Stomach, small bowel, appendix, and cecum are normal. The colon and rectosigmoid colon are normal.  Vascular/Lymphatic: Abdominal aorta is normal caliber. There is no retroperitoneal or periportal lymphadenopathy. No pelvic lymphadenopathy. Reproductive: Uterus and ovaries normal. Other: No free fluid. Musculoskeletal: Sclerotic lesion posterior aspect of the LEFT iliac bone is unchanged at 3.1 mm cm IMPRESSION: Chest Impression: 1. Stable RIGHT infrahilar nodule / lymph node. 2. No evidence disease progression in thorax. Abdomen / Pelvis Impression: 1. Interval enlargement bilateral adrenal lesions. 2. No new lesions in the abdomen pelvis. 3. Stable sclerotic lesion posterior aspect the LEFT iliac bone Electronically Signed   By: Suzy Bouchard M.D.   On: 10/10/2016 09:49   Mm Diag Breast Tomo Bilateral  Result Date: 10/30/2016 CLINICAL DATA:  Follow-up of probably benign left breast calcifications.The patient has stage IV lung cancer with osseous metastases. EXAM: 2D DIGITAL DIAGNOSTIC BILATERAL MAMMOGRAM WITH CAD AND ADJUNCT  TOMO COMPARISON:  Previous exam(s). ACR Breast Density Category c: The breast tissue is heterogeneously dense, which may obscure small masses. FINDINGS: The previously noted 3 mm group of coarse, dystrophic appearing calcifications in the upper, outer left breast are unchanged dating back to the patient's July 2017 exam. No suspicious mass or other abnormality is identified within either breast. Mammographic images were processed with CAD. IMPRESSION: Probably benign left breast calcifications. RECOMMENDATION: Bilateral diagnostic mammogram in July 2019. I have discussed the findings and recommendations with the patient. Results were also provided in writing at the conclusion of the visit. If applicable, a reminder letter will be sent to the patient regarding the next appointment. BI-RADS CATEGORY  3: Probably benign. Electronically Signed   By: Pamelia Hoit M.D.   On: 10/30/2016 11:42    Labs:  CBC:  Recent Labs  09/20/16 0941 09/29/16 0829 10/04/16 0825 10/11/16 1028  WBC 1.6* 5.0 4.5 2.7*  HGB 7.7* 11.8 9.7* 9.5*  HCT 24.3* 35.5 30.8* 28.6*  PLT 339 528* 433* 338    COAGS: No results for input(s): INR, APTT in the last 8760 hours.  BMP:  Recent Labs  05/22/16 1546  09/20/16 0941 09/29/16 0829 10/04/16 0825 10/11/16 1028  NA 143  < > 136 139 139 136  K 3.4*  < > 3.6 3.7 3.8 2.9*  CL 108*  --   --   --   --   --   CO2 18  < > 22 20* 22 25  GLUCOSE 90  < > 85 109 93 116  BUN 3*  < > 2.0* 2.0* 3.9* <2.0*  CALCIUM 9.2  < > 9.0 8.4 8.7 8.9  CREATININE 0.56*  < > 0.7 0.7 0.7 0.7  GFRNONAA 114  --   --   --   --   --   GFRAA 131  --   --   --   --   --   < > = values in this interval not displayed.  LIVER FUNCTION TESTS:  Recent Labs  09/20/16 0941 09/29/16 0829 10/04/16 0825 10/11/16 1028  BILITOT <0.22 0.35 0.44 0.29  AST 17 13 12  34  ALT 8 6 <6 17  ALKPHOS 60 84 78 94  PROT 6.1* 6.9 6.3* 7.1  ALBUMIN 2.6* 2.9* 2.5* 2.6*    TUMOR MARKERS: No results for  input(s): AFPTM, CEA, CA199, CHROMGRNA in the last 8760 hours.  Assessment and Plan:  LISANDRA MATHISEN is a 46 y.o. female with past medical history significant for Stage IV (T1a, N2, M1b) non-small cell lung cancer, adenocarcinoma, with brain and bone metastases, who has experienced persistent and recently worsening left pelvic/hip  pain secondary to known osseous metastatic disease involving the posterior aspect of left ilium.   As the patient has received 2 previous sessions of radiotherapy, she is no longer a candidate for palliative radiotherapy. As such, prolonged conversations were held with the patient regarding the benefits and risks (including but not limited to bleeding, infection, injury to adjacent structure and nerve and nontarget cement administration) of osteo-cool radiofrequency ablation procedure.  I explained that this is a relatively new technology and while myself and several of my partners have had excellent experience performing the osteo-cool procedure for spinal osseous metastatisis, we have yet to perform this outside of the spine, though it has been widely reported in the recent literature to be both safe and effective.    I explained that as she is very symptomatic and given the lesion's location, I feel this procedure could be attempted for palliative purposes.  Following his prolonged, candid and detailed conversation, the patient wishes to pursue the Osteocool procedure.  As the patient has not had imaging since the time of her fall, I will proceed with obtaining a contrast enhanced pelvic MRI to evaluate for both the extent of residual enhancing disease as well as to evaluate for the presence of a pathologic fracture.  Pending the acquisition of the contrast enhanced pelvic MRI, I will review the images and discuss the case and procedural technique with my interventional radiology partners and will establish a definitive plan of care.   Thank you for this interesting  consult.  I greatly enjoyed meeting [employer] and look forward to participating in their care.  A copy of this report was sent to the requesting provider on this date.  Electronically Signed: Sandi Mariscal 10/31/2016, 4:18 PM   I spent a total of 30 Minutes in face to face in clinical consultation, greater than 50% of which was counseling/coordinating care for symptomatic osseous metastasis involving the posterior aspect of the left ilium

## 2016-11-02 ENCOUNTER — Other Ambulatory Visit: Payer: Medicaid Other

## 2016-11-02 ENCOUNTER — Ambulatory Visit: Payer: Medicaid Other

## 2016-11-02 ENCOUNTER — Other Ambulatory Visit (HOSPITAL_COMMUNITY): Payer: Self-pay | Admitting: Interventional Radiology

## 2016-11-02 ENCOUNTER — Other Ambulatory Visit: Payer: Self-pay | Admitting: Medical Oncology

## 2016-11-02 ENCOUNTER — Telehealth: Payer: Self-pay | Admitting: Radiation Oncology

## 2016-11-02 ENCOUNTER — Ambulatory Visit
Admission: RE | Admit: 2016-11-02 | Discharge: 2016-11-02 | Disposition: A | Discharge status: Home / Self Care | Payer: Medicaid Other | Source: Ambulatory Visit | Attending: Interventional Radiology | Admitting: Interventional Radiology

## 2016-11-02 ENCOUNTER — Other Ambulatory Visit: Payer: Self-pay | Admitting: Nurse Practitioner

## 2016-11-02 DIAGNOSIS — M898X9 Other specified disorders of bone, unspecified site: Secondary | ICD-10-CM

## 2016-11-02 DIAGNOSIS — G4700 Insomnia, unspecified: Secondary | ICD-10-CM

## 2016-11-02 DIAGNOSIS — C3491 Malignant neoplasm of unspecified part of right bronchus or lung: Secondary | ICD-10-CM

## 2016-11-02 DIAGNOSIS — M899 Disorder of bone, unspecified: Secondary | ICD-10-CM

## 2016-11-02 DIAGNOSIS — C3490 Malignant neoplasm of unspecified part of unspecified bronchus or lung: Secondary | ICD-10-CM

## 2016-11-02 DIAGNOSIS — R102 Pelvic and perineal pain: Secondary | ICD-10-CM

## 2016-11-02 DIAGNOSIS — R112 Nausea with vomiting, unspecified: Secondary | ICD-10-CM

## 2016-11-02 DIAGNOSIS — C7951 Secondary malignant neoplasm of bone: Secondary | ICD-10-CM

## 2016-11-02 MED ORDER — ONDANSETRON HCL 4 MG PO TABS: 4.0000 mg | ORAL_TABLET | Freq: Three times a day (TID) | ORAL | 0 refills | Status: DC | PRN

## 2016-11-02 MED ORDER — MIRTAZAPINE 30 MG PO TABS: 30.0000 mg | ORAL_TABLET | Freq: Every day | ORAL | 0 refills | Status: DC

## 2016-11-02 MED ORDER — MIRTAZAPINE 30 MG PO TABS: 45.0000 mg | ORAL_TABLET | Freq: Every day | ORAL | 0 refills | Status: DC

## 2016-11-02 MED ORDER — OXYCODONE-ACETAMINOPHEN 10-325 MG PO TABS: 1.0000 | ORAL_TABLET | Freq: Four times a day (QID) | ORAL | 0 refills | Status: DC | PRN

## 2016-11-02 MED ORDER — FENTANYL 75 MCG/HR TD PT72: 75.0000 ug | MEDICATED_PATCH | TRANSDERMAL | 0 refills | Status: DC

## 2016-11-02 MED FILL — OXYCODONE-ACETAMINOPHEN 10-: 10-325 | 15 days supply | Qty: 60 | Fill #0

## 2016-11-02 MED FILL — MIRTAZAPINE 30 MG TABLET: 30 | 30 days supply | Qty: 30 | Fill #0

## 2016-11-02 MED FILL — ONDANSETRON HCL 4 MG TABLET: 4 | 7 days supply | Qty: 20 | Fill #0

## 2016-11-02 NOTE — Telephone Encounter (Signed)
Returned patient's call. Patient explains she was seen by Dr. Pascal Lux today and he scheduled her to have imaging done Monday (7/16). She reports new onset of palpable nodule just above her ileum on the left side. She states, "Dr. Pascal Lux felt it." Assured patient this RN would inform Dr. Tammi Klippel of the new development. Encouraged her to keep appointment for imaging on Monday so that she can move forward with treatment with Dr. Pascal Lux and the new nodule can be investigated further. She verbalized understanding. Informed Dr. Tammi Klippel of these findings.

## 2016-11-03 ENCOUNTER — Telehealth: Payer: Self-pay | Admitting: Urology

## 2016-11-03 MED FILL — fentaNYL 75 MCG/HR PT72: 75 | 30 days supply | Qty: 10 | Fill #0

## 2016-11-03 NOTE — Telephone Encounter (Signed)
I spoke with the patient today to review our plan of care.  She has recently met with Dr. Pascal Lux, Interventional Radiologist, to discuss the potential for Osteocool procedure for pain relief to the left iliac bone.  She had a recent plain film xray of the pelvis on 11/02/16 and is scheduled for pelvis MRI on Monday 11/06/16 in preparation for this procedure. She was felt to be a good candidate but needed to r/o occult fracture in light of several recent falls and to assess for significant changes to the left iliac tumor volume prior to proceeding with this procedure. Pelvis imaging did not show any acute findings or gross changes in appearance of the known mixed lytic and sclerotic lesion in the posterior aspect of the left ilium. She will call us to let us know when she is scheduled to have the Osteocool procedure so that we can then move forward with scheduling palliative radiotherapy to bilateral adrenal metastases shortly thereafter.  I also informed her that [facility] was apparently having difficulty contacting her based on a fax communication that I received.  I am not sure what number they were using to contact her but I have faxed over a request to re-attempt to contact her using the [phone] number.  They will evaluate her home for safety needs and measure her to provide the correct height walker since the one she has currently is too short and cannot be adjusted any higher. We will stay in contact with her and will plan to arrange for CT Sim following her Osteocool procedure in preparation to begin palliative radiotherapy to the bilateral adrenal gland metastases.  I stressed the importance for her to call us to let us know when her Osteocool procedure is scheduled so that we can move forward with radiotherapy.  She states her understanding, agreement and compliance with the stated plan.

## 2016-11-06 ENCOUNTER — Telehealth: Payer: Self-pay | Admitting: *Deleted

## 2016-11-06 ENCOUNTER — Ambulatory Visit (HOSPITAL_COMMUNITY)
Admission: RE | Admit: 2016-11-06 | Discharge: 2016-11-06 | Disposition: A | Discharge status: Home / Self Care | Payer: Medicaid Other | Source: Ambulatory Visit | Attending: Interventional Radiology | Admitting: Interventional Radiology

## 2016-11-06 DIAGNOSIS — M899 Disorder of bone, unspecified: Secondary | ICD-10-CM | POA: Insufficient documentation

## 2016-11-06 DIAGNOSIS — M898X9 Other specified disorders of bone, unspecified site: Secondary | ICD-10-CM

## 2016-11-06 DIAGNOSIS — R609 Edema, unspecified: Secondary | ICD-10-CM | POA: Insufficient documentation

## 2016-11-06 MED ORDER — GADOBENATE DIMEGLUMINE 529 MG/ML IV SOLN
10.0000 mL | Freq: Once | INTRAVENOUS | Status: AC | PRN
  Administered 2016-11-06: 10 mL via INTRAVENOUS

## 2016-11-06 NOTE — Telephone Encounter (Signed)
Call from pt asking about appts. She has MRI this afternoon. Informed of next [place] visit 7/26.

## 2016-11-06 NOTE — Telephone Encounter (Addendum)
Confirmed appts. While on the phone she asked if MRI results were available. I told her I would send note to ordering MD.

## 2016-11-07 ENCOUNTER — Telehealth: Payer: Self-pay | Admitting: Interventional Radiology

## 2016-11-07 ENCOUNTER — Telehealth: Payer: Self-pay | Admitting: Radiation Oncology

## 2016-11-07 ENCOUNTER — Telehealth: Payer: Self-pay | Admitting: *Deleted

## 2016-11-07 NOTE — Telephone Encounter (Signed)
"  This is Printmaker with [facility].  Calling to let Dr.Manning know Physical Therapy will go out today to start [place] for this patient.  Who will sign off on the orders?"

## 2016-11-07 NOTE — Telephone Encounter (Signed)
Discussed results of contrast enhanced pelvic MRI with pt.  Pelvic MRI demonstrates an excellent response to XRT of the dominant lesion involving the posterior aspect of the ilium.  As such, I do NOT feel that proceeding with osteocool will result in a significant improvement in the patient's left hip and pelvic pain.  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  Unfortunately the pt has also developed another 1.8 cm sacral lesion anterior to the left S1-S2 neural foramen, however I do not believe this tiny lesion is resulting in any symptoms (pt denies radiating left leg symptoms) and this lesion is NOT amendable to osteocool given the proximity to the nerve.  Pt demonstrated excellent understanding of the above discussion.   Above also discussed with Freeman Caldron, XRT PA.  Ronny Bacon, MD Pager #: [phone]

## 2016-11-07 NOTE — Telephone Encounter (Signed)
Received voicemail message from [place], [place] for [facility] requesting a return call. Phoned her back. She inquired if Dr. Tammi Klippel would be the attending over this patient's physical therapy. Explained Dr. Tammi Klippel or his PA, Freeman Caldron would be. Stressed our primary need is to ensure her environment is safe and her walker is the correct size because we believe this is contributing to her falls. Jonelle Sports verbalized understanding and expressed appreciation for the call back.

## 2016-11-09 ENCOUNTER — Telehealth: Payer: Self-pay | Admitting: Radiation Oncology

## 2016-11-09 NOTE — Telephone Encounter (Signed)
Faxed signed [place] to [facility]. Fax confirmation of delivery obtained.

## 2016-11-10 ENCOUNTER — Telehealth: Payer: Self-pay | Admitting: Urology

## 2016-11-10 ENCOUNTER — Other Ambulatory Visit: Payer: Self-pay | Admitting: Radiation Oncology

## 2016-11-10 ENCOUNTER — Telehealth: Payer: Self-pay | Admitting: Radiation Oncology

## 2016-11-10 ENCOUNTER — Other Ambulatory Visit: Payer: Self-pay | Admitting: Nurse Practitioner

## 2016-11-10 DIAGNOSIS — C7931 Secondary malignant neoplasm of brain: Secondary | ICD-10-CM

## 2016-11-10 DIAGNOSIS — C3491 Malignant neoplasm of unspecified part of right bronchus or lung: Secondary | ICD-10-CM

## 2016-11-10 NOTE — Telephone Encounter (Signed)
I called and spoke with the patient regarding scheduling her CT [facility] for treatment planning for palliative radiotherapy to bilateral adrenal glands since it has been determined that she is not a good candidate for the Osteocool procedure based on recent MRI findings.  I advised that Enid Derry would be calling to schedule the CT [facility] for next week.  In the interim, I will follow up with Dr. Worthy Flank office to update them on this plan.  We anticipate a course of 10 treatments of palliative radiotherapy to the bilateral adrenal glands.  She also voiced concern regarding a new, painful, swollen mass on her back, just above her left ilium. 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  She also expressed concern for progressive weakness in the lower extremities and asked if this could be related to her known brain metastasis.  She is s/p whole brain radiation in 01/2016.  Her last MRI brain was performed in 05/2016: IMPRESSION: Brain metastasis are decreased in size, no longer appreciable, for only visible on SWI compatible with involution and treatment response when compared with the prior MRI of the brain. No new metastasis identified. 1. Left frontal, 12 mm, series 12, image 120. 2. Right lateral frontal, 8 mm, series 12, image 96. 3. Left occipital periventricular, 5 mm, series 12, image 91. 4. Right occipital, susceptibility hypointensity only, no enhancement, series 7, image 17.  We will move forward with scheduling her CT [facility] for treatment planning for palliative radiotherapy to bilateral adrenal glands.  I will also order a repeat Brain MRI since it has been 4 months since her last scan.  We will try to coordinate these appointments to occur on the same day due to transportation  issues.    Nicholos Johns, PA-C

## 2016-11-10 NOTE — Telephone Encounter (Signed)
Faxed order for wheelchair and physical therapy to [facility]. Fax confirmation of delivery obtained.

## 2016-11-13 ENCOUNTER — Telehealth: Payer: Self-pay | Admitting: *Deleted

## 2016-11-13 ENCOUNTER — Telehealth: Payer: Self-pay | Admitting: Internal Medicine

## 2016-11-13 ENCOUNTER — Encounter: Payer: Self-pay | Admitting: *Deleted

## 2016-11-13 NOTE — Progress Notes (Signed)
Oncology Nurse Navigator Documentation  Oncology Nurse Navigator Flowsheets 11/13/2016  Navigator Location [facility]  Navigator Encounter Type Other/per Dr. Julien Nordmann, he would like appt with Lattie Haw and infusion cancelled on 7/26.  He would like to see patient in 3 weeks after radiation.  I notified scheduling to call patient to cancel appt on 7/26 and re-schedule with Dr. Julien Nordmann.   Barriers/Navigation Needs Coordination of Care  Interventions Coordination of Care  Acuity Level 2  Time Spent with Patient 30

## 2016-11-13 NOTE — Telephone Encounter (Signed)
CALLED PATIENT TO INFORM OF MRI FOR 11-17-16 @ [facility], LVM FOR A RETURN CALL

## 2016-11-13 NOTE — Telephone Encounter (Signed)
R/s and cancelled appt per 7/23 message - left message with app date and time and sent reminder letter in the mail.

## 2016-11-15 ENCOUNTER — Telehealth: Payer: Self-pay

## 2016-11-15 ENCOUNTER — Telehealth: Payer: Self-pay | Admitting: Medical Oncology

## 2016-11-15 ENCOUNTER — Other Ambulatory Visit: Payer: Self-pay | Admitting: Medical Oncology

## 2016-11-15 DIAGNOSIS — C3490 Malignant neoplasm of unspecified part of unspecified bronchus or lung: Secondary | ICD-10-CM

## 2016-11-15 DIAGNOSIS — C7951 Secondary malignant neoplasm of bone: Secondary | ICD-10-CM

## 2016-11-15 DIAGNOSIS — E46 Unspecified protein-calorie malnutrition: Secondary | ICD-10-CM

## 2016-11-15 MED ORDER — OXYCODONE-ACETAMINOPHEN 10-325 MG PO TABS: 1.0000 | ORAL_TABLET | Freq: Four times a day (QID) | ORAL | 0 refills | Status: DC | PRN

## 2016-11-15 MED ORDER — DRONABINOL 2.5 MG PO CAPS: 2.5000 mg | ORAL_CAPSULE | Freq: Two times a day (BID) | ORAL | 0 refills | Status: DC

## 2016-11-15 NOTE — Telephone Encounter (Signed)
Pt called for percocet and marinol refills. Asked for call when ready for pickup.

## 2016-11-15 NOTE — Telephone Encounter (Signed)
Faxed notes supporting pt need for specialized wheelchair.

## 2016-11-15 NOTE — Telephone Encounter (Signed)
Left message for pt -rx ready for pick up

## 2016-11-16 ENCOUNTER — Other Ambulatory Visit: Payer: Medicaid Other

## 2016-11-16 ENCOUNTER — Ambulatory Visit: Payer: Medicaid Other | Admitting: Radiation Oncology

## 2016-11-16 ENCOUNTER — Ambulatory Visit: Payer: Medicaid Other

## 2016-11-16 ENCOUNTER — Ambulatory Visit: Payer: Medicaid Other | Admitting: Nurse Practitioner

## 2016-11-16 ENCOUNTER — Encounter: Payer: Medicaid Other | Admitting: Nutrition

## 2016-11-16 MED FILL — DRONABINOL 2.5 MG CAP: 2.5 | 30 days supply | Qty: 60 | Fill #0

## 2016-11-16 MED FILL — OXYCODONE-ACETAMINOPHEN 10-: 10-325 | 15 days supply | Qty: 60 | Fill #0

## 2016-11-17 ENCOUNTER — Ambulatory Visit (HOSPITAL_COMMUNITY)
Admission: RE | Admit: 2016-11-17 | Discharge: 2016-11-17 | Disposition: A | Discharge status: Home / Self Care | Payer: Medicaid Other | Source: Ambulatory Visit | Attending: Urology | Admitting: Urology

## 2016-11-17 ENCOUNTER — Ambulatory Visit
Admission: RE | Admit: 2016-11-17 | Discharge: 2016-11-17 | Disposition: A | Discharge status: Home / Self Care | Payer: Medicaid Other | Source: Ambulatory Visit | Attending: Radiation Oncology | Admitting: Radiation Oncology

## 2016-11-17 DIAGNOSIS — C7971 Secondary malignant neoplasm of right adrenal gland: Secondary | ICD-10-CM

## 2016-11-17 DIAGNOSIS — Z510 Encounter for antineoplastic radiation therapy: Secondary | ICD-10-CM | POA: Diagnosis not present

## 2016-11-17 DIAGNOSIS — C7931 Secondary malignant neoplasm of brain: Secondary | ICD-10-CM | POA: Diagnosis not present

## 2016-11-17 DIAGNOSIS — C801 Malignant (primary) neoplasm, unspecified: Secondary | ICD-10-CM

## 2016-11-17 DIAGNOSIS — C7951 Secondary malignant neoplasm of bone: Secondary | ICD-10-CM

## 2016-11-17 DIAGNOSIS — C3491 Malignant neoplasm of unspecified part of right bronchus or lung: Secondary | ICD-10-CM

## 2016-11-17 DIAGNOSIS — C7972 Secondary malignant neoplasm of left adrenal gland: Secondary | ICD-10-CM

## 2016-11-17 MED ORDER — SODIUM CHLORIDE 0.9% FLUSH
10.0000 mL | Freq: Once | INTRAVENOUS | Status: AC
  Administered 2016-11-17: 10 mL via INTRAVENOUS

## 2016-11-17 MED ORDER — HEPARIN SOD (PORK) LOCK FLUSH 100 UNIT/ML IV SOLN
500.0000 [IU] | Freq: Once | INTRAVENOUS | Status: AC
  Administered 2016-11-17: 500 [IU] via INTRAVENOUS

## 2016-11-17 MED ORDER — HEPARIN SOD (PORK) LOCK FLUSH 100 UNIT/ML IV SOLN
500.0000 [IU] | INTRAVENOUS | Status: AC | PRN
  Administered 2016-11-17: 500 [IU]

## 2016-11-17 MED ORDER — LIDOCAINE-PRILOCAINE 2.5-2.5 % EX CREA
TOPICAL_CREAM | Freq: Once | CUTANEOUS | Status: AC
  Administered 2016-11-17: 13:00:00 via TOPICAL
  Filled 2016-11-17: qty 5

## 2016-11-17 MED ORDER — GADOBENATE DIMEGLUMINE 529 MG/ML IV SOLN
10.0000 mL | Freq: Once | INTRAVENOUS | Status: AC | PRN
  Administered 2016-11-17: 10 mL via INTRAVENOUS

## 2016-11-17 NOTE — Progress Notes (Signed)
Received patient from [employer]. Applied EMLA cream to right subclavian power port as ordered. Approximately twenty minutes later I accessed the patient's port per protocol. Blood return obtained and site flushed without difficulty. Patient tolerated this well. Secured access in place. Contacted CT staff that the patient is ready.

## 2016-11-17 NOTE — Progress Notes (Signed)
  Radiation Oncology         [phone] ________________________________  Name: Andrea [name] MRN: [id number]  Date: 11/17/2016  DOB: 04-20-1971  STEREOTACTIC BODY RADIOTHERAPY SIMULATION AND TREATMENT PLANNING NOTE    ICD-10-CM   1. 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  Identity was confirmed.  All relevant records and images related to the planned course of therapy were reviewed.  The patient freely provided informed written consent to proceed with treatment after reviewing the details related to the planned course of therapy. The consent form was witnessed and verified by the simulation staff.  Then, the patient was set-up in a stable reproducible  supine position for radiation therapy.  A BodyFix immobilization pillow was fabricated for reproducible positioning.  Surface markings were placed.  The CT images were loaded into the planning software.  The gross target volumes (GTV) and planning target volumes (PTV) were delinieated, and avoidance structures were contoured.  Treatment planning then occurred.  The radiation prescription was entered and confirmed.  A total of two complex treatment devices were fabricated in the form of the BodyFix immobilization pillow and a neck accuform cushion.  I have requested : 3D Simulation  I have requested a DVH of the following structures: targets and all normal structures near the target including left kidney, right kidney, spinal cord, bowel, stomach, liver and targets as noted on the radiation plan to maintain doses in adherence with established limits  PLAN:  The patient will receive 40 Gy in 5 fractions to bilateral adrenal mets and a subcutaneous met on left posterior lower  back.  ________________________________  Sheral Apley Tammi Klippel, M.D.

## 2016-11-17 NOTE — Progress Notes (Signed)
Has armband been applied?  Yes.    Does patient have an allergy to IV contrast dye?: No.   Has patient ever received premedication for IV contrast dye?: No.   Does patient take metformin?: No.  If patient does take metformin when was the last dose: n/a  Date of lab work: 10/11/2016 BUN: <2.0 CR: 0.7  IV site: subclavian right, condition no redness  Has IV site been added to flowsheet?  Yes.

## 2016-11-28 ENCOUNTER — Telehealth: Payer: Self-pay | Admitting: Medical Oncology

## 2016-11-28 DIAGNOSIS — Z510 Encounter for antineoplastic radiation therapy: Secondary | ICD-10-CM | POA: Diagnosis not present

## 2016-11-28 NOTE — Telephone Encounter (Signed)
Equipment PT called about her wheelchair I called [facility] . [facility] received order for wheelchair from Dr Tammi Klippel -not a rollater wheelchair. Furthermore, pt insurance has denied future visits from PT . I asked Melissa to call pt with this update.  Bx- pt asking if she can have another bx.

## 2016-11-28 NOTE — Telephone Encounter (Signed)
I left message for pt to call back regarding clarity on bx question.

## 2016-11-29 ENCOUNTER — Telehealth: Payer: Self-pay

## 2016-11-29 ENCOUNTER — Ambulatory Visit
Admission: RE | Admit: 2016-11-29 | Discharge: 2016-11-29 | Disposition: A | Discharge status: Home / Self Care | Payer: Medicaid Other | Source: Ambulatory Visit | Attending: Radiation Oncology | Admitting: Radiation Oncology

## 2016-11-29 ENCOUNTER — Telehealth: Payer: Self-pay | Admitting: Medical Oncology

## 2016-11-29 ENCOUNTER — Other Ambulatory Visit: Payer: Self-pay | Admitting: Medical Oncology

## 2016-11-29 DIAGNOSIS — C3491 Malignant neoplasm of unspecified part of right bronchus or lung: Secondary | ICD-10-CM

## 2016-11-29 DIAGNOSIS — C3490 Malignant neoplasm of unspecified part of unspecified bronchus or lung: Secondary | ICD-10-CM

## 2016-11-29 DIAGNOSIS — C801 Malignant (primary) neoplasm, unspecified: Principal | ICD-10-CM

## 2016-11-29 DIAGNOSIS — Z510 Encounter for antineoplastic radiation therapy: Secondary | ICD-10-CM | POA: Diagnosis not present

## 2016-11-29 DIAGNOSIS — C7972 Secondary malignant neoplasm of left adrenal gland: Secondary | ICD-10-CM

## 2016-11-29 DIAGNOSIS — C7951 Secondary malignant neoplasm of bone: Secondary | ICD-10-CM

## 2016-11-29 MED ORDER — OXYCODONE-ACETAMINOPHEN 10-325 MG PO TABS: 1.0000 | ORAL_TABLET | Freq: Four times a day (QID) | ORAL | 0 refills | Status: DC | PRN

## 2016-11-29 MED ORDER — FENTANYL 100 MCG/HR TD PT72: 100.0000 ug | MEDICATED_PATCH | TRANSDERMAL | 0 refills | Status: DC

## 2016-11-29 NOTE — Telephone Encounter (Signed)
Pt calling for a percocet refill. She comes for xrt at 1100. She is using 2 percocet every 4 hours.   Can anyone tell her what the knot is on her L illeum that is causing her so much pain?  About the biopsy question, she was thinking that only a blood biopsy was done. On 12/21/14 a bone bx was done. A copy of the report was printed for pt to pick up with her Rx.

## 2016-11-29 NOTE — Telephone Encounter (Signed)
Pt present to lobby reporting increased pain and shows me a "knot " over her left  flank area which she reports as very painful. There is a small knot palpable at site and tender to touch.  . Pt states she has been taking percocet 2 tabs every 4 hours and that she  removed her fentanyl patch 75 mcg yesterday because " I  was having radiation". I did not find a fetanyl patch on her today.  Fentanyl Dose increased and education provided-- Per Mohamed new rx for Fentanyl 100 mcg given to pt along with refill rx for  percocet 1 q 6. I instructed pt to discard Fentanyl 75 mcg  patch and apply  159mcg patch today. I also instructed her to take her percocet as prescribed- 1 q 6 hours.  I told her she should not remove  her fentanyl patche  for radiation. She voiced understanding

## 2016-11-29 NOTE — Telephone Encounter (Signed)
Thank you for the update [name]. I really appreciate you. Andrea [name]

## 2016-11-30 ENCOUNTER — Telehealth: Payer: Self-pay | Admitting: Medical Oncology

## 2016-11-30 ENCOUNTER — Other Ambulatory Visit: Payer: Self-pay | Admitting: Medical Oncology

## 2016-11-30 DIAGNOSIS — C3490 Malignant neoplasm of unspecified part of unspecified bronchus or lung: Secondary | ICD-10-CM

## 2016-11-30 MED ORDER — LIDOCAINE-PRILOCAINE 2.5-2.5 % EX CREA: TOPICAL_CREAM | CUTANEOUS | 99 refills | Status: DC

## 2016-11-30 MED FILL — OXYCODONE-ACETAMINOPHEN 10-: 10-325 | 15 days supply | Qty: 60 | Fill #0

## 2016-11-30 MED FILL — LIDOCAINE-PRILOCAINE CREAM: 2.5-2.5 | 15 days supply | Qty: 30 | Fill #0

## 2016-11-30 NOTE — Telephone Encounter (Signed)
refill request for emla cream sent to pharmacy

## 2016-12-01 ENCOUNTER — Ambulatory Visit
Admission: RE | Admit: 2016-12-01 | Discharge: 2016-12-01 | Disposition: A | Discharge status: Home / Self Care | Payer: Medicaid Other | Source: Ambulatory Visit | Attending: Radiation Oncology | Admitting: Radiation Oncology

## 2016-12-01 MED FILL — fentaNYL 100 MCG/HR PT72: 100 | 15 days supply | Qty: 5 | Fill #0

## 2016-12-01 MED FILL — VOLTAREN 1% GEL: 1 | 13 days supply | Qty: 200 | Fill #1

## 2016-12-04 ENCOUNTER — Ambulatory Visit
Admission: RE | Admit: 2016-12-04 | Discharge: 2016-12-04 | Disposition: A | Discharge status: Home / Self Care | Payer: Medicaid Other | Source: Ambulatory Visit | Attending: Radiation Oncology | Admitting: Radiation Oncology

## 2016-12-04 DIAGNOSIS — Z510 Encounter for antineoplastic radiation therapy: Secondary | ICD-10-CM | POA: Diagnosis not present

## 2016-12-06 ENCOUNTER — Ambulatory Visit
Admission: RE | Admit: 2016-12-06 | Discharge: 2016-12-06 | Disposition: A | Discharge status: Home / Self Care | Payer: Medicaid Other | Source: Ambulatory Visit | Attending: Radiation Oncology | Admitting: Radiation Oncology

## 2016-12-06 DIAGNOSIS — Z510 Encounter for antineoplastic radiation therapy: Secondary | ICD-10-CM | POA: Diagnosis not present

## 2016-12-08 ENCOUNTER — Ambulatory Visit
Admission: RE | Admit: 2016-12-08 | Discharge: 2016-12-08 | Disposition: A | Discharge status: Home / Self Care | Payer: Medicaid Other | Source: Ambulatory Visit | Attending: Radiation Oncology | Admitting: Radiation Oncology

## 2016-12-08 ENCOUNTER — Telehealth: Payer: Self-pay

## 2016-12-08 DIAGNOSIS — Z510 Encounter for antineoplastic radiation therapy: Secondary | ICD-10-CM | POA: Diagnosis not present

## 2016-12-08 NOTE — Telephone Encounter (Signed)
Pt was asking what her appt were for. lvm delineating her appts.

## 2016-12-09 NOTE — Addendum Note (Signed)
Encounter addended by: Tyler Pita, MD on: 12/09/2016  3:05 PM<BR>    Actions taken: Delete clinical note

## 2016-12-11 ENCOUNTER — Encounter: Payer: Self-pay | Admitting: Radiation Oncology

## 2016-12-11 ENCOUNTER — Ambulatory Visit
Admission: RE | Admit: 2016-12-11 | Discharge: 2016-12-11 | Disposition: A | Discharge status: Home / Self Care | Payer: Medicaid Other | Source: Ambulatory Visit | Attending: Radiation Oncology | Admitting: Radiation Oncology

## 2016-12-11 DIAGNOSIS — Z510 Encounter for antineoplastic radiation therapy: Secondary | ICD-10-CM | POA: Diagnosis not present

## 2016-12-11 NOTE — Progress Notes (Signed)
  Radiation Oncology         [phone]) [phone] ________________________________  Name: Andrea [name] MRN: [id number]  Date: 12/11/2016  DOB: [birth year]  End of Treatment Note  Diagnosis:   46 y.o. 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  The left adrenal gland was treated to 40 Gy in 5 fractions of 8 Gy.  2.  The right adrenal gland was also treated to 40 Gy in 5 fractions of 8 Gy. 3.  A subcutaneous metastasis left posterior flank was treated to 40 Gy in 5 fractions of 8 Gy  Beams/energy:   SBRT/SRT-3D // 6X-FFF, 10X-FFF Photon  Narrative: The patient tolerated radiation treatment relatively well.  She stated that she experienced poor appetite and nausea, both of which she will discuss with Dr. Julien Nordmann later this week. She also reported that her hip pain improved with radiotherapy and denied any other pain.   Plan: The patient has completed radiation treatment. The patient will return to radiation oncology clinic for routine followup in one month. I advised her to call or return sooner if she has any questions or concerns related to her recovery or treatment. ________________________________  Sheral Apley. Tammi Klippel, M.D.   This document serves as a record of services personally performed by Tyler Pita, MD. It was created on his behalf by Rae Lips, a trained medical scribe. The creation of this record is based on the scribe's personal observations and the provider's statements to them. This document has been checked and approved by the attending provider.

## 2016-12-11 NOTE — Progress Notes (Signed)
The patient was seen at the machine today for a quick under treatment visit. She states that she sees Dr. Julien Nordmann later this week and that she's had some trouble with poor appetite despite merinol. She completed her last of 5 fractions to bilateral adrenal glands and to the left flank soft tissue nodule. She reports her pain has improved with radiotherapy in the soft tissue nodule. She denies any progressive pain elsewhere. She will discuss her nausea regimen with Dr. Julien Nordmann, but also has Zofran. She should also utilize this. She is in agreement to follow up in 4 weeks for her post treatment visit.     Carola Rhine, PAC

## 2016-12-12 ENCOUNTER — Other Ambulatory Visit: Payer: Self-pay | Admitting: Medical Oncology

## 2016-12-12 ENCOUNTER — Telehealth: Payer: Self-pay | Admitting: Medical Oncology

## 2016-12-12 ENCOUNTER — Other Ambulatory Visit (HOSPITAL_BASED_OUTPATIENT_CLINIC_OR_DEPARTMENT_OTHER): Payer: Medicaid Other

## 2016-12-12 ENCOUNTER — Encounter: Payer: Self-pay | Admitting: Internal Medicine

## 2016-12-12 ENCOUNTER — Ambulatory Visit (HOSPITAL_BASED_OUTPATIENT_CLINIC_OR_DEPARTMENT_OTHER): Payer: Medicaid Other | Admitting: Internal Medicine

## 2016-12-12 DIAGNOSIS — C7951 Secondary malignant neoplasm of bone: Secondary | ICD-10-CM

## 2016-12-12 DIAGNOSIS — C3491 Malignant neoplasm of unspecified part of right bronchus or lung: Secondary | ICD-10-CM | POA: Diagnosis not present

## 2016-12-12 DIAGNOSIS — Z95828 Presence of other vascular implants and grafts: Secondary | ICD-10-CM

## 2016-12-12 DIAGNOSIS — C801 Malignant (primary) neoplasm, unspecified: Secondary | ICD-10-CM

## 2016-12-12 DIAGNOSIS — E876 Hypokalemia: Secondary | ICD-10-CM

## 2016-12-12 DIAGNOSIS — C7972 Secondary malignant neoplasm of left adrenal gland: Secondary | ICD-10-CM | POA: Diagnosis not present

## 2016-12-12 DIAGNOSIS — C7931 Secondary malignant neoplasm of brain: Secondary | ICD-10-CM | POA: Diagnosis not present

## 2016-12-12 DIAGNOSIS — C3490 Malignant neoplasm of unspecified part of unspecified bronchus or lung: Secondary | ICD-10-CM

## 2016-12-12 LAB — CBC WITH DIFFERENTIAL/PLATELET
BASO%: 0.9 % (ref 0.0–2.0)
BASOS ABS: 0 10*3/uL (ref 0.0–0.1)
EOS%: 0.3 % (ref 0.0–7.0)
Eosinophils Absolute: 0 10*3/uL (ref 0.0–0.5)
HCT: 29.3 % — ABNORMAL LOW (ref 34.8–46.6)
HEMOGLOBIN: 9.7 g/dL — AB (ref 11.6–15.9)
LYMPH%: 7 % — ABNORMAL LOW (ref 14.0–49.7)
MCH: 29.7 pg (ref 25.1–34.0)
MCHC: 33 g/dL (ref 31.5–36.0)
MCV: 90 fL (ref 79.5–101.0)
MONO#: 0.5 10*3/uL (ref 0.1–0.9)
MONO%: 15.5 % — AB (ref 0.0–14.0)
NEUT#: 2.3 10*3/uL (ref 1.5–6.5)
NEUT%: 76.3 % (ref 38.4–76.8)
Platelets: 600 10*3/uL — ABNORMAL HIGH (ref 145–400)
RBC: 3.25 10*6/uL — ABNORMAL LOW (ref 3.70–5.45)
RDW: 15.5 % — ABNORMAL HIGH (ref 11.2–14.5)
WBC: 3.1 10*3/uL — ABNORMAL LOW (ref 3.9–10.3)
lymph#: 0.2 10*3/uL — ABNORMAL LOW (ref 0.9–3.3)

## 2016-12-12 LAB — COMPREHENSIVE METABOLIC PANEL
ALBUMIN: 2.6 g/dL — AB (ref 3.5–5.0)
ALK PHOS: 86 U/L (ref 40–150)
ALT: <6 U/L (ref 0–55)
AST: 13 U/L (ref 5–34)
Anion Gap: 9 mEq/L (ref 3–11)
BUN: 3.7 mg/dL — AB (ref 7.0–26.0)
CHLORIDE: 92 meq/L — AB (ref 98–109)
CO2: 30 mEq/L — ABNORMAL HIGH (ref 22–29)
Calcium: 9.3 mg/dL (ref 8.4–10.4)
Creatinine: 0.6 mg/dL (ref 0.6–1.1)
EGFR: >90 mL/min/{1.73_m2} (ref >90)
GLUCOSE: 97 mg/dL (ref 70–140)
POTASSIUM: 2.5 meq/L — AB (ref 3.5–5.1)
SODIUM: 132 meq/L — AB (ref 136–145)
Total Bilirubin: 0.37 mg/dL (ref 0.20–1.20)
Total Protein: 7.2 g/dL (ref 6.4–8.3)

## 2016-12-12 MED ORDER — OLANZAPINE 10 MG PO TABS: 10.0000 mg | ORAL_TABLET | Freq: Every day | ORAL | 0 refills | Status: AC

## 2016-12-12 MED ORDER — HEPARIN SOD (PORK) LOCK FLUSH 100 UNIT/ML IV SOLN
500.0000 [IU] | Freq: Once | INTRAVENOUS | Status: AC
  Administered 2016-12-12: 500 [IU] via INTRAVENOUS
  Filled 2016-12-12: qty 5

## 2016-12-12 MED ORDER — LIDOCAINE 5 % EX PTCH: 1.0000 | MEDICATED_PATCH | CUTANEOUS | 0 refills | Status: DC

## 2016-12-12 MED ORDER — SODIUM CHLORIDE 0.9 % IJ SOLN
10.0000 mL | INTRAMUSCULAR | Status: AC
  Administered 2016-12-12: 10 mL via INTRAVENOUS
  Filled 2016-12-12: qty 10

## 2016-12-12 MED ORDER — LIDOCAINE-PRILOCAINE 2.5-2.5 % EX CREA: TOPICAL_CREAM | CUTANEOUS | 99 refills | Status: DC

## 2016-12-12 MED ORDER — OXYCODONE-ACETAMINOPHEN 10-325 MG PO TABS
1.0000 | ORAL_TABLET | Freq: Four times a day (QID) | ORAL | 0 refills | Status: DC | PRN
Start: 2016-12-12 — End: 2017-01-02

## 2016-12-12 MED ORDER — METHYLPREDNISOLONE 4 MG PO TBPK: ORAL_TABLET | ORAL | 0 refills | Status: AC

## 2016-12-12 MED ORDER — FENTANYL 100 MCG/HR TD PT72
100.0000 ug | MEDICATED_PATCH | TRANSDERMAL | 0 refills | Status: DC
Start: 2016-12-12 — End: 2017-01-29

## 2016-12-12 MED ORDER — POTASSIUM CHLORIDE CRYS ER 20 MEQ PO TBCR: 20.0000 meq | EXTENDED_RELEASE_TABLET | Freq: Two times a day (BID) | ORAL | 0 refills | Status: DC

## 2016-12-12 MED FILL — OLANZapine 10 MG TABS: 10 | 30 days supply | Qty: 30 | Fill #0

## 2016-12-12 MED FILL — METHYLPREDNISOLONE 4 MG TAB: 4 | 6 days supply | Qty: 21 | Fill #0

## 2016-12-12 MED FILL — POTASSIUM CL ER 20 MEQ TABL: 20 | 14 days supply | Qty: 28 | Fill #0

## 2016-12-12 MED FILL — LIDOCAINE-PRILOCAINE CREAM: 2.5-2.5 | 15 days supply | Qty: 30 | Fill #1

## 2016-12-12 NOTE — Progress Notes (Signed)
Andrea [name] [phone]   [phone]) [phone]  OFFICE PROGRESS NOTE  Andrea Ripple, MD [place] [address]  DIAGNOSIS: Stage IV (T1a, N2, M1b) non-small cell lung cancer, adenocarcinoma with negative EGFR mutation and presented with a right hilar mass, subcarinal lymphadenopathy as well as a large soft tissue mass in the posterior iliac bone and metastatic bone lesions diagnosed in August 2016.  PRIOR THERAPY: 1) Status post palliative radiotherapy to the soft tissue mass in the left iliac bone under the care of Dr. Valere Dross. 2) Systemic chemotherapy with carboplatin for AUC of 5 and Alimta 500 MG/M2 every 3 weeks. First dose expected on 02/22/2015. Status post 6 cycles. Starting from cycle #3 Alimta was reduced to 400 MG/M2 secondary to neutropenia after the first 2 cycles. 3) Maintenance systemic chemotherapy with single agent Alimta 500 MG/M2 every 3 weeks. First dose 07/19/2015. Status post 3 cycles. Last dose was given 08/30/2015 discontinued secondary to disease progression. 4)  Immunotherapy treatment with Tecentriq (Atezolizumab) 1200 MG IV every 3 weeks, for his dose 10/14/2015. Status post 6 cycles, discontinued secondary to disease progression. 5) whole brain irradiation for multiple new brain metastasis expected to be completed on 02/12/2016. 6) palliative radiotherapy to the progressive bone lesion in the left hip. 7) Systemic chemotherapy with docetaxel 75 MG/M2 and Cyramza 10 MG/KG every 3 weeks status post 5 cycles. Discontinued today secondary to disease progression. 8) Systemic chemotherapy with gemcitabine 1000 MG/M2 on days 1 and 8 every 3 weeks. First dose 07/12/2016. Status post 5 cycles. Discontinued today secondary to disease progression. 9) palliative radiotherapy to the bilateral adrenal glands under the care of Dr. Tammi Klippel.  CURRENT THERAPY:  1) Xgeva 120 mg subcutaneously on monthly basis for metastatic bone  disease  INTERVAL HISTORY: Andrea [name] 46 y.o. female clinic today for follow-up visit on stricture by EMS accompanied with one of the patient advocates, Andrea [name]. The patient has several issues today including increasing fatigue and weakness as well as pain in the lower back and hip area. She also has lack of appetite but she is currently on Marinol. She has several pounds in the last few months. She also has nausea not responding to her current treatment with Zofran. The patient denied having any current chest pain or shortness of breath with no cough or hemoptysis. She has no fever or chills. She denied having any headache or visual changes. She underwent palliative radiotherapy to the bilateral adrenal glands under the care of Dr. Tammi Klippel. The patient was found on previous imaging studies to have evidence for disease progression. She also had palpable nodules under her skin concerning for metastatic disease. She was undergoing the palliative radiotherapy during that time. She is here today for reevaluation and discussion of treatment options.  MEDICAL HISTORY: Past Medical History:  Diagnosis Date  . Adrenal mass, left ([place]) 09/21/2015  . Anginal pain ([place])    chest pain not sure what related to  . Anxiety   . Bone cancer ([place])    lung ca with bone mets  . Chronic back pain   . Depression   . Dysmenorrhea 12/06/2009   Qualifier: Diagnosis of  By: [name] RN, Regino Schultze    . Encounter for antineoplastic immunotherapy 10/07/2015  . Family history of adverse reaction to anesthesia    mother stopped breathing after surgery  . Goals of care, counseling/discussion 05/03/2016  . Headache    stress  . Low back pain radiating  to left leg 11/29/2010  . Non-small cell carcinoma of right lung, stage 4 ([place]) 01/01/2015  . S/P radiation therapy 01/01/2015 through 01/21/2015     Left iliac bone 3500 cGy in 14 sessions   . Seizure  ([place])   . Shortness of breath dyspnea    with exertion, Pt denies 02/2015    ALLERGIES:  is allergic to morphine and related and hydrocodone.  MEDICATIONS:  Current Outpatient Prescriptions  Medication Sig Dispense Refill  . diclofenac sodium (VOLTAREN) 1 % GEL Apply 4 g topically 4 (four) times daily. 200 g 3  . dronabinol (MARINOL) 2.5 MG capsule Take 1 capsule (2.5 mg total) by mouth 2 (two) times daily before a meal. 60 capsule 0  . fentaNYL (DURAGESIC - DOSED MCG/HR) 100 MCG/HR Place 1 patch (100 mcg total) onto the skin every 3 (three) days. 5 patch 0  . levETIRAcetam (KEPPRA) 1000 MG tablet Take 1 tablet (1,000 mg total) by mouth 2 (two) times daily. 180 tablet 4  . lidocaine-prilocaine (EMLA) cream Apply a small amount to skin over port site 1 to 2 hours before use cover to keep in contact with skin 30 g PRN  . loratadine (CLARITIN) 10 MG tablet Take 1 tablet (10 mg total) by mouth daily.    . mirtazapine (REMERON) 30 MG tablet Take 1 tablet (30 mg total) by mouth at bedtime. 30 tablet 0  . Multiple Vitamins-Minerals (MULTIVITAMIN WITH MINERALS) tablet Take by mouth.    . ondansetron (ZOFRAN) 4 MG tablet Take 1 tablet (4 mg total) by mouth every 8 (eight) hours as needed for nausea or vomiting. 20 tablet 0  . oxyCODONE-acetaminophen (PERCOCET) 10-325 MG tablet Take 1 tablet by mouth every 6 (six) hours as needed for pain. 60 tablet 0  . potassium chloride SA (KLOR-CON M20) 20 MEQ tablet Take 2 tablets (40 mEq total) by mouth daily. 14 tablet 0  . senna-docusate (SENOKOT-S) 8.6-50 MG tablet Take 2 tablets by mouth at bedtime. 30 tablet 0   No current facility-administered medications for this visit.     SURGICAL HISTORY:  Past Surgical History:  Procedure Laterality Date  . laproscopy surgery to check her tubes    . LUMBAR LAMINECTOMY/DECOMPRESSION MICRODISCECTOMY Left 05/01/2014   Procedure: Microdiscectomy - L5-S1 - left;  Surgeon: Eustace Moore, MD;  Location: [facility];  Service:  Neurosurgery;  Laterality: Left;    REVIEW OF SYSTEMS:  Constitutional: positive for anorexia, fatigue and weight loss Eyes: negative Ears, nose, mouth, throat, and face: negative Respiratory: negative Cardiovascular: negative Gastrointestinal: positive for nausea Genitourinary:negative Integument/breast: negative Hematologic/lymphatic: negative Musculoskeletal:positive for bone pain and muscle weakness Neurological: negative Behavioral/Psych: negative Endocrine: negative Allergic/Immunologic: negative   PHYSICAL EXAMINATION: General appearance: alert, cooperative, fatigued and no distress Head: Normocephalic, without obvious abnormality, atraumatic Neck: no adenopathy, no JVD, supple, symmetrical, trachea midline and thyroid not enlarged, symmetric, no tenderness/mass/nodules Lymph nodes: Cervical, supraclavicular, and axillary nodes normal. Resp: clear to auscultation bilaterally Back: symmetric, no curvature. ROM normal. No CVA tenderness. Cardio: regular rate and rhythm, S1, S2 normal, no murmur, click, rub or gallop GI: soft, non-tender; bowel sounds normal; no masses,  no organomegaly Extremities: extremities normal, atraumatic, no cyanosis or edema Neurologic: Alert and oriented X 3, normal strength and tone. Normal symmetric reflexes. Normal coordination and gait  ECOG PERFORMANCE STATUS: 2 - Symptomatic, <50% confined to bed  Blood pressure 101/74, pulse (!) 103, temperature 97.8 F (36.6 C), temperature source Oral, resp. rate 18, height 5' 6"  (1.676 m),  SpO2 99 %.  LABORATORY DATA: Lab Results  Component Value Date   WBC 2.7 (L) 10/11/2016   HGB 9.5 (L) 10/11/2016   HCT 28.6 (L) 10/11/2016   MCV 96.1 10/11/2016   PLT 338 10/11/2016      Chemistry      Component Value Date/Time   NA 136 10/11/2016 1028   K 2.9 (LL) 10/11/2016 1028   CL 108 (H) 05/22/2016 1546   CO2 25 10/11/2016 1028   BUN <2.0 (L) 10/11/2016 1028   CREATININE 0.7 10/11/2016 1028        Component Value Date/Time   CALCIUM 8.9 10/11/2016 1028   ALKPHOS 94 10/11/2016 1028   AST 34 10/11/2016 1028   ALT 17 10/11/2016 1028   BILITOT 0.29 10/11/2016 1028       RADIOGRAPHIC STUDIES: Mr Jeri Cos GM Contrast  Result Date: 11/17/2016 CLINICAL DATA:  Metastatic lung cancer.  S RS treatment.  Follow-up. EXAM: MRI HEAD WITHOUT AND WITH CONTRAST TECHNIQUE: Multiplanar, multiecho pulse sequences of the brain and surrounding structures were obtained without and with intravenous contrast. CONTRAST:  37m MULTIHANCE GADOBENATE DIMEGLUMINE 529 MG/ML IV SOLN COMPARISON:  06/16/2016.  02/01/2016. FINDINGS: Brain: Evidence of disease progression with respect to the left frontal lesion. More regional vasogenic edema. Centrally necrotic enhancing mass axial image 44 measures 16 x 14 x 13 mm today as opposed to 14 x 11 x 9 mm previously. Treated right posterior frontal lesion image 36 is the same or smaller measuring 7 mm today as opposed 8 mm previously. Treated left occipital periventricular lesion image 31 is the same or smaller today measuring 4 mm. No evidence of the previously treated right posterior parietal lesion on today's 1.5 T scan. Previously treated cerebellar lesions are no longer visible in any way. Other previously treated small supratentorial lesions (reference study of 02/01/2016) also invisible. No new lesions. No evidence of ischemic infarction. No hydrocephalus or extra-axial collection. No leptomeningeal enhancement. Vascular: Major vessels at the base of the brain show flow. Skull and upper cervical spine: Negative Sinuses/Orbits: Larger mastoid effusions. Mild paranasal sinus mucosal thickening. Orbits negative. Other: None significant IMPRESSION: Enlarging centrally necrotic left frontal lesion now measuring 16 x 14 x 13 mm as opposed to 14 x 11 x 9 mm, with slightly more surrounding edema. Stable treated lesions in the right posterior frontal region and left occipital region. No  longer any visible finding relating to the previously treated right posterior parietal lesion, 1.5 T scan today however. Other small previously treated cerebellar and supratentorial lesions likewise invisible. Electronically Signed   By: MNelson ChimesM.D.   On: 11/17/2016 08:38    ASSESSMENT AND PLAN:  This is a very pleasant 46years old African-American female with metastatic non-small cell lung cancer, adenocarcinoma with bone and brain metastasis. The patient underwent several treatments in the past including induction systemic chemotherapy with carboplatin and Alimta discontinued secondary to intolerance followed by maintenance treatment with single agent Alimta discontinued secondary to disease progression followed by treatment with immunotherapy with Tecentriq (Atezolizumab) for 6 cycles discontinued secondary to disease progression. The patient was then started on treatment with docetaxel and Cyramza discontinued secondary to disease progression. She also had palliative radiation to the brain for metastatic disease to the brain. She was treated with systemic chemotherapy with single agent gemcitabine status post 5 cycles discontinued today secondary to disease progression. She had repeat CT scan of the chest, abdomen and pelvis performed recently. I personally and independently reviewed the scan  images and discuss the results with the patient and her mother today. Unfortunately her scan showed evidence for disease progression with enlargement of the bilateral adrenal lesions. The patient underwent palliative radiotherapy to the bilateral adrenal lesions. I had a lengthy discussion with the patient and her advocate today about her current condition and treatment options. Unfortunately the patient had significant disease progression to the point that further systemic chemotherapy may not be effective. I strongly recommend for the patient to consider palliative care and hospice referral. The  patient was very reluctant about this option and was wondering about any other systemic treatment option. I offered her treatment with single agent Navelbine but also explained to the patient that it may not be effective in managing her condition at this point. She would like some time to think about her options before making a decision. I reviewed some of the previous studies and treatment with the patient and her advocate. For the weight loss, she will continue on Marinol and I also added Medrol Dosepak. I discussed with the patient treatment with Megace but we decided not to consider it because of the high-risk of deep venous thrombosis and pulmonary embolism in a patient with very limited ambulation. For the back pain and subcutaneous nodules, I gave the patient prescription for lidocaine patch to be applied once daily. For pain management, she was given a refill of fentanyl patch and Percocet. For the persistent nausea and intractable to the current medication with Zofran and Compazine, I will start the patient on Zyprexa 10 mg by mouth daily at bedtime. The patient will call the office with her decision regarding chemotherapy or hospice referral. I will arrange her follow-up appointment based on her decision. She was advised to call if she has any other concerning symptoms in the interval. The patient voices understanding of current disease status and treatment options and is in agreement with the current care plan. All questions were answered. The patient knows to call the clinic with any problems, questions or concerns. We can certainly see the patient much sooner if necessary.  Disclaimer: This note was dictated with voice recognition software. Similar sounding words can inadvertently be transcribed and may not be corrected upon review.

## 2016-12-12 NOTE — Telephone Encounter (Signed)
l left message to call back and tell me what pharmacy to call in [place].

## 2016-12-13 ENCOUNTER — Telehealth: Payer: Self-pay | Admitting: Internal Medicine

## 2016-12-13 NOTE — Telephone Encounter (Signed)
No additional appts added per 8/21 - No follow-up appointment is needed at this point. We will arrange future appointment after the patient makes a decision regarding her chemotherapy.

## 2016-12-13 NOTE — Progress Notes (Signed)
[facility]  PATIENT: Andrea [name] DOB: 06/05/1970   REASON FOR VISIT: follow up for seizure disorder, lung cancer with metastases to the brain HISTORY FROM:patient and mom    HISTORY OF PRESENT ILLNESS:  08/24/16 Dr Andrea [name] is a 46 years old right-handed female, seen in refer by her primary care doctor Andrea [name] for evaluation of seizure, her oncologist is Dr. Curt [name], initial evaluation was on Aug 24 2016.  I was able to review and summarize her oncology note, she had a history of metastatic non-small cell lung cancer, adenocarcinoma with bone and brain metastasis.  She has been treated with several chemotherapy, immunotherapy, with continued disease progression. He is now undergoing treatment with systemic chemotherapy with single agent gemcitabine status post 1 cycle. She also has significant left iliac and low back pain, is using Percocet, fentanyl.  Diagnosis was made in August 2016, status post palliative radiation of the soft tissue mass in the left iliac bone, systemic chemotherapy since October 2016, status post 6 cycles, immunotherapy with Atezolizumab, first dose June 2017, status post 6 cycle, discontinued secondary to disease progression, whole brain radiation for multiple brain metastatic lesions completed on February 12 2016. Palliative radiotherapy to progressive bone lesion in the left hip.  She reported a history of seizure in October 2017, described as complex partial seizure with secondary generalization, she suddenly become confused, she could see herself falling, but could not stopping it, followed by lost consciousness, seizure activity, she had total about 5 seizures between October to November 2017, last a seizure was in November 2017, she is now on Keppra 1000 mg twice a day, tolerating it well.   We have personally reviewed MRI of brain with and without contrast in February 2018: Multiple supratentorial metastatic lesions,  left frontal, right lateral frontal, left occipital periventricular, right occipital,   She has significant left hip pain, left iliac pain, could not bearing weight for extended period of time, has to sleep lying on her right side during interview UPDATE 08/23/2018CM Ms. Andrea [name] 46 year old female returns for follow-up with history of seizure disorder and cancer with metastases to the brain. Last seizure occurred in October 2017. Unfortunately she has been off of her Keppra for about 2 months. She is trying to decide if she wants more chemotherapy or go to hospice. She continues to live alone with assistance from her mom. She denies any falls. She denies any difficulty tolerating her Keppra when she takes it. She does complain of nausea most days. She complains with muscle aches and bone aches. She returns for reevaluation  REVIEW OF SYSTEMS: Full 14 system review of systems performed and notable only for those listed, all others are neg:  Constitutional: neg  Cardiovascular: neg Ear/Nose/Throat: neg  Skin: neg Eyes: neg Respiratory: Shortness of breath Gastroitestinal: Nausea and vomiting  Hematology/Lymphatic: neg  Endocrine: neg Musculoskeletal: Joint pain walking difficulty Allergy/Immunology: neg Neurological: Weakness seizure disorder Psychiatric: neg Sleep : neg   ALLERGIES: Allergies  Allergen Reactions  . Morphine And Related Rash    Patient states that if she takes more than 30 days she gets rashes.  . Hydrocodone Rash    HOME MEDICATIONS: Outpatient Medications Prior to Visit  Medication Sig Dispense Refill  . diclofenac sodium (VOLTAREN) 1 % GEL Apply 4 g topically 4 (four) times daily. 200 g 3  . dronabinol (MARINOL) 2.5 MG capsule Take 1 capsule (2.5 mg total) by mouth 2 (two) times daily before a meal. 60 capsule  0  . fentaNYL (DURAGESIC - DOSED MCG/HR) 100 MCG/HR Place 1 patch (100 mcg total) onto the skin every 3 (three) days. 5 patch 0  . lidocaine (LIDODERM) 5 %  Place 1 patch onto the skin daily. Remove & Discard patch within 12 hours or as directed by MD 30 patch 0  . lidocaine-prilocaine (EMLA) cream Apply a small amount to skin over port site 1 to 2 hours before use cover to keep in contact with skin 30 g PRN  . loratadine (CLARITIN) 10 MG tablet Take 1 tablet (10 mg total) by mouth daily.    . methylPREDNISolone (MEDROL DOSEPAK) 4 MG TBPK tablet Use as instructed 21 tablet 0  . mirtazapine (REMERON) 30 MG tablet Take 1 tablet (30 mg total) by mouth at bedtime. 30 tablet 0  . Multiple Vitamins-Minerals (MULTIVITAMIN WITH MINERALS) tablet Take by mouth.    . OLANZapine (ZYPREXA) 10 MG tablet Take 1 tablet (10 mg total) by mouth at bedtime. 30 tablet 0  . ondansetron (ZOFRAN) 4 MG tablet Take 1 tablet (4 mg total) by mouth every 8 (eight) hours as needed for nausea or vomiting. 20 tablet 0  . oxyCODONE-acetaminophen (PERCOCET) 10-325 MG tablet Take 1 tablet by mouth every 6 (six) hours as needed for pain. 60 tablet 0  . potassium chloride SA (KLOR-CON M20) 20 MEQ tablet Take 1 tablet (20 mEq total) by mouth 2 (two) times daily. Take one tablet twice a day for 14 days 28 tablet 0  . senna-docusate (SENOKOT-S) 8.6-50 MG tablet Take 2 tablets by mouth at bedtime. 30 tablet 0  . levETIRAcetam (KEPPRA) 1000 MG tablet Take 1 tablet (1,000 mg total) by mouth 2 (two) times daily. 180 tablet 4   No facility-administered medications prior to visit.     PAST MEDICAL HISTORY: Past Medical History:  Diagnosis Date  . Adrenal mass, left ([place]) 09/21/2015  . Anginal pain ([place])    chest pain not sure what related to  . Anxiety   . Bone cancer ([place])    lung ca with bone mets  . Chronic back pain   . Depression   . Dysmenorrhea 12/06/2009   Qualifier: Diagnosis of  By: [name] RN, Regino Schultze    . Encounter for antineoplastic immunotherapy 10/07/2015  . Family history of adverse reaction to anesthesia    mother stopped breathing after surgery  . Goals of care,  counseling/discussion 05/03/2016  . Headache    stress  . Low back pain radiating to left leg 11/29/2010  . Non-small cell carcinoma of right lung, stage 4 ([place]) 01/01/2015  . S/P radiation therapy 01/01/2015 through 01/21/2015     Left iliac bone 3500 cGy in 14 sessions   . Seizure ([place])   . Shortness of breath dyspnea    with exertion, Pt denies 02/2015    PAST SURGICAL HISTORY: Past Surgical History:  Procedure Laterality Date  . laproscopy surgery to check her tubes    . LUMBAR LAMINECTOMY/DECOMPRESSION MICRODISCECTOMY Left 05/01/2014   Procedure: Microdiscectomy - L5-S1 - left;  Surgeon: Eustace Moore, MD;  Location: [facility];  Service: Neurosurgery;  Laterality: Left;    FAMILY HISTORY: Family History  Problem Relation Age of Onset  . Diabetes Mother   . Hypertension Mother   . Hypertension Father   . Heart attack Maternal Grandfather   . Hypertension Maternal Grandfather   . Heart attack Paternal Grandfather   . Hypertension Paternal Grandfather   . Breast cancer Maternal Aunt   . Breast  cancer Maternal Aunt     SOCIAL HISTORY: Social History   Social History  . Marital status: Single    Spouse name: N/A  . Number of children: 0  . Years of education: Associates   Occupational History  . Unemployed    Social History Main Topics  . Smoking status: Former Smoker    Packs/day: 0.50    Years: 25.00    Types: Cigarettes, E-cigarettes    Quit date: 12/29/2014  . Smokeless tobacco: Never Used     Comment: Smoking very little.  . Alcohol use 0.0 oz/week     Comment: Rarely  . Drug use: No     Comment: previoulsly.  [name] Sexual activity: Not Currently   Other Topics Concern  . Not on file   Social History Narrative   Lives at home alone.   Right-handed.   No caffeine use.     PHYSICAL EXAM  Vitals:   12/14/16 1355  BP: 92/64  Pulse: 88  Weight: 105 lb 6.4 oz (47.8 kg)  Height: 5\' 6"   (1.676 m)   Body mass index is 17.01 kg/m.  Generalized: Well developed, in no acute distress , well groomed Head: normocephalic and atraumatic,. Oropharynx benign  Neck: Supple,   Musculoskeletal: No deformity   Neurological examination   Mentation: Alert oriented to time, place, history taking. Attention span and concentration appropriate. Recent and remote memory intact.  Follows all commands speech and language fluent.   Cranial nerve II-XII: Fundoscopic exam reveals sharp disc margins.Pupils were equal round reactive to light extraocular movements were full, visual field were full on confrontational test. Facial sensation and strength were normal. hearing was intact to finger rubbing bilaterally. Uvula tongue midline. head turning and shoulder shrug were normal and symmetric.Tongue protrusion into cheek strength was normal. Motor: Limited exam due to left hip and iliac pain but no  significant upper lower extremity weakness  Sensory: normal and symmetric to light touch, in the upper and lower extremities Coordination: finger-nose-finger, heel-to-shin bilaterally, no dysmetria Reflexes: Brachioradialis 2/2, biceps 2/2, triceps 2/2, patellar 2/2, Achilles 2/2, plantar responses were flexor bilaterally. Gait and Station: Not ambulated in wheelchair  DIAGNOSTIC DATA (LABS, IMAGING, TESTING) - I reviewed patient records, labs, notes, testing and imaging myself where available.  Lab Results  Component Value Date   WBC 3.1 (L) 12/12/2016   HGB 9.7 (L) 12/12/2016   HCT 29.3 (L) 12/12/2016   MCV 90.0 12/12/2016   PLT 600 (H) 12/12/2016      Component Value Date/Time   NA 132 (L) 12/12/2016 1043   K 2.5 (LL) 12/12/2016 1043   CL 108 (H) 05/22/2016 1546   CO2 30 (H) 12/12/2016 1043   GLUCOSE 97 12/12/2016 1043   BUN 3.7 (L) 12/12/2016 1043   CREATININE 0.6 12/12/2016 1043   CALCIUM 9.3 12/12/2016 1043   PROT 7.2 12/12/2016 1043   ALBUMIN 2.6 (L) 12/12/2016 1043   AST 13 12/12/2016  1043   ALT <6 12/12/2016 1043   ALKPHOS 86 12/12/2016 1043   BILITOT 0.37 12/12/2016 1043   GFRNONAA 114 05/22/2016 1546   GFRNONAA 85 01/08/2014 1637   GFRAA 131 05/22/2016 1546   GFRAA >89 01/08/2014 1637    Lab Results  Component Value Date   TSH 0.561 03/01/2016      ASSESSMENT AND PLAN  46 y.o. year old female  has a past medical history of Adrenal mass, left ([place]) (09/21/2015); Anginal pain ([place]); Anxiety; Bone cancer ([place]); Chronic back pain; Depression;  counseling/discussion (05/03/2016); Headache; Low back pain radiating to left leg (11/29/2010); Non-small cell carcinoma of right lung, stage 4 ([place]) (01/01/2015); S/P radiation therapy  Seizure ([place]); and Shortness of breath dyspnea. here to follow-up for her seizure disorder. She has been off of her Keppra for approximately 2 months  Restart Keppra 1000 mg twice a day Please remember, common seizure triggers are: Sleep deprivation, dehydration, overheating, stress, hypoglycemia or skipping meals, certain medications or excessive alcohol use, especially stopping alcohol abruptly if you have had heavy alcohol use before (aka alcohol withdrawal seizure). If you have a prolonged seizure over 2-5 minutes or back to back seizures, call or have someone call 911 or take you to the nearest emergency room. You cannot drive a car or operate any other machinery or vehicle within 6 months of a seizure. Please do not swim alone or take a tub bath for safety. Do not cook with large quantities of boiling water or oil for safety. Take your medicine for seizure prevention regularly and do not skip doses or stop medication abruptly and tone are told to do so by your healthcare provider. Try to get a refill on your antiepileptic medication ahead of time, so you are not at risk of running out. If you run out of the seizure medication and do not have a refill at hand she may run into medication withdrawal seizures.  Call for seizure activity F/U prn  Dennie Bible, [facility], [facility], APRN  [facility] [address], [place], [place] [address] [phone]

## 2016-12-14 ENCOUNTER — Encounter: Payer: Self-pay | Admitting: Nurse Practitioner

## 2016-12-14 ENCOUNTER — Ambulatory Visit (INDEPENDENT_AMBULATORY_CARE_PROVIDER_SITE_OTHER): Payer: Medicaid Other | Admitting: Nurse Practitioner

## 2016-12-14 VITALS — BP 92/64 | HR 88 | Ht 66.0 in | Wt 105.4 lb

## 2016-12-14 DIAGNOSIS — R569 Unspecified convulsions: Secondary | ICD-10-CM | POA: Diagnosis not present

## 2016-12-14 MED FILL — OXYCODONE-ACETAMINOPHEN 10-: 10-325 | 15 days supply | Qty: 60 | Fill #0

## 2016-12-14 MED FILL — levETIRAcetam 1000 MG TABS: 1000 | 34 days supply | Qty: 68 | Fill #1

## 2016-12-14 MED FILL — fentaNYL 100 MCG/HR PT72: 100 | 15 days supply | Qty: 5 | Fill #0

## 2016-12-14 NOTE — Patient Instructions (Addendum)
Continue Keppra 1000 mg twice a day Please remember, common seizure triggers are: Sleep deprivation, dehydration, overheating, stress, hypoglycemia or skipping meals, certain medications or excessive alcohol use, especially stopping alcohol abruptly if you have had heavy alcohol use before (aka alcohol withdrawal seizure). If you have a prolonged seizure over 2-5 minutes or back to back seizures, call or have someone call 911 or take you to the nearest emergency room. You cannot drive a car or operate any other machinery or vehicle within 6 months of a seizure. Please do not swim alone or take a tub bath for safety. Do not cook with large quantities of boiling water or oil for safety. Take your medicine for seizure prevention regularly and do not skip doses or stop medication abruptly and tone are told to do so by your healthcare provider. Try to get a refill on your antiepileptic medication ahead of time, so you are not at risk of running out. If you run out of the seizure medication and do not have a refill at hand she may run into medication withdrawal seizures.  Call for seizure activity F/U prn

## 2016-12-15 NOTE — Progress Notes (Signed)
I have reviewed and agreed above plan. 

## 2016-12-26 ENCOUNTER — Encounter: Payer: Self-pay | Admitting: Internal Medicine

## 2016-12-26 NOTE — Progress Notes (Signed)
Received forwarded staff message form [name] RN regarding PA for Lidocaine patch.  Called [place] [employer] Prior Authorizations at [phone] and spoke with [place]. She informed PA was denied due to not tried and failed two of several medications. I asked if she could fax the letter. She states it cannot be faxed and was mailed to the patient as well as to the doctor. Interaction id for the call was [id number]. LG#[id number]. Advised her I would have RN to call and obtain information over the phone.  I replied back to staff message providing information to [name] RN.

## 2017-01-01 ENCOUNTER — Telehealth: Payer: Self-pay | Admitting: Medical Oncology

## 2017-01-01 DIAGNOSIS — C3490 Malignant neoplasm of unspecified part of unspecified bronchus or lung: Secondary | ICD-10-CM

## 2017-01-01 DIAGNOSIS — C801 Malignant (primary) neoplasm, unspecified: Secondary | ICD-10-CM

## 2017-01-01 DIAGNOSIS — C7951 Secondary malignant neoplasm of bone: Secondary | ICD-10-CM

## 2017-01-01 DIAGNOSIS — R112 Nausea with vomiting, unspecified: Secondary | ICD-10-CM

## 2017-01-01 DIAGNOSIS — C7972 Secondary malignant neoplasm of left adrenal gland: Secondary | ICD-10-CM

## 2017-01-01 MED ORDER — ONDANSETRON HCL 4 MG PO TABS: 4.0000 mg | ORAL_TABLET | Freq: Three times a day (TID) | ORAL | 0 refills | Status: DC | PRN

## 2017-01-01 MED FILL — ONDANSETRON HCL 4 MG TABLET: 4 | 8 days supply | Qty: 20 | Fill #0

## 2017-01-01 NOTE — Telephone Encounter (Signed)
I left message for pt to return my call re refill percocet and kdur. I did send  refill for zofran to [facility]

## 2017-01-01 NOTE — Addendum Note (Signed)
Addended by: Ardeen Garland on: 01/01/2017 12:05 PM   Modules accepted: Orders

## 2017-01-02 ENCOUNTER — Other Ambulatory Visit: Payer: Self-pay | Admitting: *Deleted

## 2017-01-02 DIAGNOSIS — C7951 Secondary malignant neoplasm of bone: Secondary | ICD-10-CM

## 2017-01-02 DIAGNOSIS — C3490 Malignant neoplasm of unspecified part of unspecified bronchus or lung: Secondary | ICD-10-CM

## 2017-01-02 DIAGNOSIS — C801 Malignant (primary) neoplasm, unspecified: Secondary | ICD-10-CM

## 2017-01-02 DIAGNOSIS — C7972 Secondary malignant neoplasm of left adrenal gland: Secondary | ICD-10-CM

## 2017-01-02 MED ORDER — OXYCODONE-ACETAMINOPHEN 10-325 MG PO TABS: 1.0000 | ORAL_TABLET | Freq: Four times a day (QID) | ORAL | 0 refills | Status: DC | PRN

## 2017-01-02 MED FILL — OXYCODONE-ACETAMINOPHEN 10-: 10-325 | 15 days supply | Qty: 60 | Fill #0

## 2017-01-02 NOTE — Telephone Encounter (Signed)
I left message that Percocet rx is ready for pick up.

## 2017-01-02 NOTE — Telephone Encounter (Signed)
Message from pt asking to be called when Percocet script is ready for pick up. Message to collaborative RN.

## 2017-01-02 NOTE — Addendum Note (Signed)
Addended by: Ardeen Garland on: 01/02/2017 12:31 PM   Modules accepted: Orders

## 2017-01-12 ENCOUNTER — Other Ambulatory Visit: Payer: Self-pay | Admitting: Urology

## 2017-01-12 ENCOUNTER — Encounter: Payer: Self-pay | Admitting: Urology

## 2017-01-12 ENCOUNTER — Ambulatory Visit
Admission: RE | Admit: 2017-01-12 | Discharge: 2017-01-12 | Disposition: A | Discharge status: Home / Self Care | Payer: Medicaid Other | Source: Ambulatory Visit | Attending: Urology | Admitting: Urology

## 2017-01-12 VITALS — BP 102/72 | HR 96 | Temp 98.0°F | Resp 20

## 2017-01-12 DIAGNOSIS — C801 Malignant (primary) neoplasm, unspecified: Secondary | ICD-10-CM | POA: Diagnosis not present

## 2017-01-12 DIAGNOSIS — C7951 Secondary malignant neoplasm of bone: Secondary | ICD-10-CM | POA: Insufficient documentation

## 2017-01-12 DIAGNOSIS — C3490 Malignant neoplasm of unspecified part of unspecified bronchus or lung: Secondary | ICD-10-CM

## 2017-01-12 DIAGNOSIS — C3491 Malignant neoplasm of unspecified part of right bronchus or lung: Secondary | ICD-10-CM

## 2017-01-12 DIAGNOSIS — C7972 Secondary malignant neoplasm of left adrenal gland: Secondary | ICD-10-CM | POA: Diagnosis not present

## 2017-01-12 DIAGNOSIS — C7931 Secondary malignant neoplasm of brain: Secondary | ICD-10-CM | POA: Insufficient documentation

## 2017-01-12 DIAGNOSIS — C7971 Secondary malignant neoplasm of right adrenal gland: Secondary | ICD-10-CM | POA: Diagnosis not present

## 2017-01-12 DIAGNOSIS — C3400 Malignant neoplasm of unspecified main bronchus: Secondary | ICD-10-CM | POA: Insufficient documentation

## 2017-01-12 NOTE — Progress Notes (Signed)
Radiation Oncology         [phone] ________________________________  Name: Andrea [name] MRN: [id number]  Date: 01/12/2017  DOB: 02/21/1971  Post-treatment Visit  CC: Nedrud, Marybeth, MD  Nedrud, Marybeth, MD  Diagnosis:  Stage IV (T1a, N2, M1b) non-small cell lung cancer, adenocarcinoma, with brain, bone, adrenal and subcutaneous metastases     ICD-10-CM   1. Malignant neoplasm of hilus of lung, unspecified laterality (HCC) C34.00   2. 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 Metastasis to right adrenal gland of unknown origin (HCC) C79.71    C80.1     Interval Since Last Radiation:  4.5 weeks Palliative SBRT:  11/29/2016, 12/04/2016, 12/06/2016, 12/08/2016, 12/11/2016:  1.  The left adrenal gland was treated to 40 Gy in 5 fractions of 8 Gy.  2.  The right adrenal gland was also treated to 40 Gy in 5 fractions of 8 Gy. 3.  A subcutaneous metastasis left posterior flank was treated to 40 Gy in 5 fractions of 8 Gy  04/19/16 - 05/03/16 : The Left S-I region including proximal ilium was re-treated to 30 Gy in 10 fractions at 3 Gy per fraction. 02/03/16-02/16/16: The whole brain was treated to 30 Gy in 10 fractions of 3 Gy 01/01/2015 through 01/21/2015: Left iliac bone 3500 cGy in 14 sessions  Narrative:  She tolerated radiation treatment relatively well.  She stated that she experienced poor appetite and nausea, both of which were managed with medications under the direction of Dr. Mohamed. She also reported that her hip pain improved with radiotherapy and denied any other pain.   On review of systems: The patient denies chest pain, increased SOB, cough, or hemoptysis. She reports chronic nausea- improved since completion of radiation, loss of appetite, weight loss, and lower back pain.  She has chronic left lateral hip/buttock pain with known osseous metastatic disease.  She uses a walker for ambulation.  She is unable to sit or lay on her left  side due to pain.  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  She denies numbness, tingling or radiation of pain into the LEs. She has chronic numbness in her feet bilaterally which is unchanged recently. She reports increased pain/pressure with ambulation and weightbearing as well as with sitting or lying on the left side.  She denies tinnitus, headaches, seizure activity, changes in visual or auditory acuity, difficulty with speech, dizziness or balance issues. She recently met with Dr. Mohamed on 12/12/16 to discuss further systemic treatment options.  There is concern that, due to the extent of her metastases/disease progression despite multiple previous systemic agents, further systemic treatment may not be effective and she was strongly encouraged to consider palliative care and Hospice referral. However, she is giving further consideration for single agent treatment with Navelbine, knowing that this may not be effective in controlling her disease at this point.  ALLERGIES:  is allergic to morphine and related and hydrocodone.  Meds: Current Outpatient Prescriptions  Medication Sig Dispense Refill  . diclofenac   sodium (VOLTAREN) 1 % GEL Apply 4 g topically 4 (four) times daily. 200 g 3  . dronabinol (MARINOL) 2.5 MG capsule Take 1 capsule (2.5 mg total) by mouth 2 (two) times daily before a meal. 60 capsule 0  . fentaNYL (DURAGESIC - DOSED MCG/HR) 100 MCG/HR Place 1 patch (100 mcg total) onto the skin every 3 (three) days. 5  patch 0  . lidocaine (LIDODERM) 5 % Place 1 patch onto the skin daily. Remove & Discard patch within 12 hours or as directed by MD 30 patch 0  . lidocaine-prilocaine (EMLA) cream Apply a small amount to skin over port site 1 to 2 hours before use cover to keep in contact with skin 30 g PRN  . loratadine (CLARITIN) 10 MG tablet Take 1 tablet (10 mg total) by mouth daily.    . methylPREDNISolone (MEDROL DOSEPAK) 4 MG TBPK tablet Use as instructed 21 tablet 0  . mirtazapine (REMERON) 30 MG tablet Take 1 tablet (30 mg total) by mouth at bedtime. 30 tablet 0  . Multiple Vitamins-Minerals (MULTIVITAMIN WITH MINERALS) tablet Take by mouth.    . ondansetron (ZOFRAN) 4 MG tablet Take 1 tablet (4 mg total) by mouth every 8 (eight) hours as needed for nausea or vomiting. 20 tablet 0  . oxyCODONE-acetaminophen (PERCOCET) 10-325 MG tablet Take 1 tablet by mouth every 6 (six) hours as needed for pain. 60 tablet 0  . senna-docusate (SENOKOT-S) 8.6-50 MG tablet Take 2 tablets by mouth at bedtime. 30 tablet 0  . levETIRAcetam (KEPPRA) 1000 MG tablet Take 1 tablet (1,000 mg total) by mouth 2 (two) times daily. 180 tablet 4  . levETIRAcetam (KEPPRA) 1000 MG tablet Take 1,000 mg by mouth 2 (two) times daily.  4  . OLANZapine (ZYPREXA) 10 MG tablet Take 1 tablet (10 mg total) by mouth at bedtime. (Patient not taking: Reported on 01/12/2017) 30 tablet 0  . potassium chloride SA (KLOR-CON M20) 20 MEQ tablet Take 1 tablet (20 mEq total) by mouth 2 (two) times daily. Take one tablet twice a day for 14 days 28 tablet 0   No current facility-administered medications for this encounter.     Physical Findings:  oral temperature is 98 F (36.7 C). Her blood pressure is 102/72 and her pulse is 96. Her respiration is 20 and oxygen saturation is 100%.  The patient was lying down on EMS stretcher used for transport. In general this is a chronically ill appearing African-American female in no acute distress. Shet is unable to sit  or lie supine due to left hip/buttock pain. Therefore she presents lying prone on a stretcher for comfort. She's alert and oriented x4 and appropriate throughout the examination. Cardiopulmonary assessment is negative for acute distress and she exhibits normal respiratory effort. She has decreased pain to light palpation over the left lateral hip and posterior buttock as well as decreased pain to palpation of the recently treated subcutaneous nodule on the left flank which has decreased in size as well, approximately 1cm.  She has multiple other new subcutaneous nodules that have developed since we last saw her- in the left lower abdomen just proximal and lateral to the pubic symphysis, on the left thigh and 2 on the back of her head.  Lab Findings: Lab Results  Component Value Date   WBC 3.1 (L) 12/12/2016   WBC 2.9 (L) 03/08/2015   HGB 9.7 (L) 12/12/2016   HCT 29.3 (L) 12/12/2016   PLT 600 (H) 12/12/2016    Lab Results    Component Value Date   NA 132 (L) 12/12/2016   K 2.5 (LL) 12/12/2016   CHLORIDE 92 (L) 12/12/2016   CO2 30 (H) 12/12/2016   GLUCOSE 97 12/12/2016   BUN 3.7 (L) 12/12/2016   CREATININE 0.6 12/12/2016   BILITOT 0.37 12/12/2016   ALKPHOS 86 12/12/2016   AST 13 12/12/2016   ALT <6 12/12/2016   PROT 7.2 12/12/2016   ALBUMIN 2.6 (L) 12/12/2016   CALCIUM 9.3 12/12/2016   ANIONGAP 9 12/12/2016   ANIONGAP 8 03/08/2015    Radiographic Findings: No results found.  Impression/Plan:  Stage IV (T1a, N2, M1b) non-small cell lung cancer, adenocarcinoma, with diffuse metastases to brain, adrenal glands, subcutaneous nodules and bone.  The patient appears to have recovered well from the effects of radiotherapy.  We will schedule a follow up CT A/P in the next few weeks to assess her response to treatment and I will plan to call her with those results.  She is still giving consideration as to whether to proceed with additional systemic therapy or transition to palliative  care/Hospice (leaning towards additional systemic tx) and plans to reach a decision within the next month.  She will communicate her decision with Dr. Mohamed so that further plans for treatment and/or management can be finalized at that time.  I advised that we are happy to continue to participate in her care if clinically indicated but will plan to see her back on an as needed basis at this point.  She knows to call at any time with questions or concerns related to her previous radiotherapy.  She is in agreement and is comfortable with this plan.  _____________________________________     W. , PA-C  

## 2017-01-17 ENCOUNTER — Other Ambulatory Visit: Payer: Self-pay | Admitting: Medical Oncology

## 2017-01-17 DIAGNOSIS — C7972 Secondary malignant neoplasm of left adrenal gland: Secondary | ICD-10-CM

## 2017-01-17 DIAGNOSIS — C801 Malignant (primary) neoplasm, unspecified: Secondary | ICD-10-CM

## 2017-01-17 DIAGNOSIS — C7951 Secondary malignant neoplasm of bone: Secondary | ICD-10-CM

## 2017-01-17 DIAGNOSIS — C3490 Malignant neoplasm of unspecified part of unspecified bronchus or lung: Secondary | ICD-10-CM

## 2017-01-17 DIAGNOSIS — G4700 Insomnia, unspecified: Secondary | ICD-10-CM

## 2017-01-17 DIAGNOSIS — R112 Nausea with vomiting, unspecified: Secondary | ICD-10-CM

## 2017-01-17 MED ORDER — ONDANSETRON HCL 4 MG PO TABS: 4.0000 mg | ORAL_TABLET | Freq: Three times a day (TID) | ORAL | 0 refills | Status: DC | PRN

## 2017-01-17 MED ORDER — OXYCODONE-ACETAMINOPHEN 10-325 MG PO TABS: 1.0000 | ORAL_TABLET | Freq: Four times a day (QID) | ORAL | 0 refills | Status: DC | PRN

## 2017-01-17 MED ORDER — MIRTAZAPINE 30 MG PO TABS: 30.0000 mg | ORAL_TABLET | Freq: Every day | ORAL | 0 refills | Status: DC

## 2017-01-17 MED FILL — ONDANSETRON HCL 4 MG TABLET: 4 | 7 days supply | Qty: 20 | Fill #0

## 2017-01-18 ENCOUNTER — Telehealth: Payer: Self-pay

## 2017-01-18 ENCOUNTER — Telehealth: Payer: Self-pay | Admitting: Medical Oncology

## 2017-01-18 MED FILL — OXYCODONE-ACETAMINOPHEN 10-: 10-325 | 15 days supply | Qty: 60 | Fill #0

## 2017-01-18 NOTE — Telephone Encounter (Signed)
PT asking for lidoderm patches, HTey were previously denied. Called [place] [employer] Prior Authorizations at [phone] and spoke with "JoLisa".FM#[id number]. Pt needs to have tried and failed 2 of the following:tricyclic antidepressants, ssris, nsaids, Anticonvulsants and cox 2 .  Pt has tried and failed , gabapentin, diclofenac, pamelor, and Mobic. Information sent to [employer] [facility].

## 2017-01-18 NOTE — Telephone Encounter (Signed)
Call from [facility] about lidocaine patches. Pt is still wanting them.  Lido patches were rx on 8/12. The rx had to be sent for PA.  9/4 documentation from [place] states a PA was denied and a letter was mailed to pt and Jeannette. Pt is asking if this can be resolved so she can receive lidoderm patches.

## 2017-01-18 NOTE — Telephone Encounter (Signed)
Pt left message that pharmacy is trying to get a medication approved. lvm asking which medication.

## 2017-01-19 ENCOUNTER — Ambulatory Visit: Payer: Medicaid Other | Admitting: Urology

## 2017-01-19 ENCOUNTER — Telehealth: Payer: Self-pay | Admitting: Radiation Oncology

## 2017-01-19 NOTE — Telephone Encounter (Signed)
Faxed signed (by Freeman Caldron, PA-C) PLAN OF CARE back to [facility]. Confirmation fax of delivery obtained.

## 2017-01-24 ENCOUNTER — Encounter: Payer: Self-pay | Admitting: Internal Medicine

## 2017-01-24 ENCOUNTER — Other Ambulatory Visit: Payer: Self-pay | Admitting: Medical Oncology

## 2017-01-24 DIAGNOSIS — C3491 Malignant neoplasm of unspecified part of right bronchus or lung: Secondary | ICD-10-CM

## 2017-01-24 DIAGNOSIS — C7971 Secondary malignant neoplasm of right adrenal gland: Secondary | ICD-10-CM

## 2017-01-24 DIAGNOSIS — G893 Neoplasm related pain (acute) (chronic): Secondary | ICD-10-CM

## 2017-01-24 DIAGNOSIS — C801 Malignant (primary) neoplasm, unspecified: Secondary | ICD-10-CM

## 2017-01-24 DIAGNOSIS — M5136 Other intervertebral disc degeneration, lumbar region: Secondary | ICD-10-CM

## 2017-01-24 DIAGNOSIS — C7931 Secondary malignant neoplasm of brain: Secondary | ICD-10-CM

## 2017-01-24 MED ORDER — LIDOCAINE 5 % EX PTCH: 1.0000 | MEDICATED_PATCH | CUTANEOUS | 0 refills | Status: AC

## 2017-01-24 MED FILL — LIDOCAINE 5% PATCH: 5 | 30 days supply | Qty: 30 | Fill #0

## 2017-01-24 NOTE — Progress Notes (Signed)
Received PA fax from RN for Lidoderm patch.  Called [place] [employer] to follow up on request.  PA approved 01/22/17-01/17/18 PA ref#[id number] Interaction [id number].  Gave back to [employer].

## 2017-01-25 ENCOUNTER — Telehealth: Payer: Self-pay | Admitting: Internal Medicine

## 2017-01-25 NOTE — Telephone Encounter (Signed)
lm

## 2017-01-25 NOTE — Telephone Encounter (Signed)
Pls call patient call back

## 2017-01-26 ENCOUNTER — Telehealth: Payer: Self-pay | Admitting: *Deleted

## 2017-01-26 NOTE — Telephone Encounter (Signed)
Pt called lmovm states " Dr. Julien Nordmann has been waiting on me to call back with an answer about chemotherapy. I want to try one more time and get chemotherapy and see how it makes me feel." Pt last seen on 8/21-Message routed to MD for review.

## 2017-01-26 NOTE — Telephone Encounter (Signed)
Notified pt Emla cream ready for pick up at [facility] . Pt verbalized understanding.no further concerns.

## 2017-01-27 NOTE — Telephone Encounter (Signed)
Please schedule follow-up visit for her with me or Cyril Mourning next week to discuss this option.

## 2017-01-29 ENCOUNTER — Telehealth: Payer: Self-pay

## 2017-01-29 ENCOUNTER — Telehealth: Payer: Self-pay | Admitting: Internal Medicine

## 2017-01-29 ENCOUNTER — Other Ambulatory Visit: Payer: Self-pay | Admitting: Medical Oncology

## 2017-01-29 DIAGNOSIS — C7972 Secondary malignant neoplasm of left adrenal gland: Secondary | ICD-10-CM

## 2017-01-29 DIAGNOSIS — G893 Neoplasm related pain (acute) (chronic): Secondary | ICD-10-CM

## 2017-01-29 DIAGNOSIS — C3490 Malignant neoplasm of unspecified part of unspecified bronchus or lung: Secondary | ICD-10-CM

## 2017-01-29 DIAGNOSIS — G4700 Insomnia, unspecified: Secondary | ICD-10-CM

## 2017-01-29 DIAGNOSIS — E876 Hypokalemia: Secondary | ICD-10-CM

## 2017-01-29 DIAGNOSIS — C801 Malignant (primary) neoplasm, unspecified: Secondary | ICD-10-CM

## 2017-01-29 DIAGNOSIS — R112 Nausea with vomiting, unspecified: Secondary | ICD-10-CM

## 2017-01-29 DIAGNOSIS — C7951 Secondary malignant neoplasm of bone: Secondary | ICD-10-CM

## 2017-01-29 MED ORDER — POTASSIUM CHLORIDE CRYS ER 20 MEQ PO TBCR: 20.0000 meq | EXTENDED_RELEASE_TABLET | Freq: Two times a day (BID) | ORAL | 0 refills | Status: AC

## 2017-01-29 MED ORDER — DICLOFENAC SODIUM 1 % TD GEL: 4.0000 g | Freq: Four times a day (QID) | TRANSDERMAL | 1 refills | Status: AC

## 2017-01-29 MED ORDER — OXYCODONE-ACETAMINOPHEN 10-325 MG PO TABS: 1.0000 | ORAL_TABLET | Freq: Four times a day (QID) | ORAL | 0 refills | Status: DC | PRN

## 2017-01-29 MED ORDER — FENTANYL 100 MCG/HR TD PT72: 100.0000 ug | MEDICATED_PATCH | TRANSDERMAL | 0 refills | Status: DC

## 2017-01-29 MED ORDER — ONDANSETRON HCL 4 MG PO TABS: 4.0000 mg | ORAL_TABLET | Freq: Three times a day (TID) | ORAL | 0 refills | Status: DC | PRN

## 2017-01-29 MED ORDER — MIRTAZAPINE 30 MG PO TABS: 30.0000 mg | ORAL_TABLET | Freq: Every day | ORAL | 0 refills | Status: AC

## 2017-01-29 MED FILL — VOLTAREN 1% GEL: 1 | 13 days supply | Qty: 200 | Fill #0

## 2017-01-29 MED FILL — ONDANSETRON HCL 4 MG TABLET: 4 | 7 days supply | Qty: 20 | Fill #0

## 2017-01-29 MED FILL — POTASSIUM CL ER 20 MEQ TABL: 20 | 30 days supply | Qty: 60 | Fill #0

## 2017-01-29 NOTE — Telephone Encounter (Signed)
Pt called for refill of a patch, potassium, sz medicine., percocet, zofran and mirtazapine.  S/w diane. The keppra is rx by rad onc and [name] will send message to [employer] for this. Dr Julien Nordmann said to refill the potassium.  S/w pt that it is the fentanyl patch. Pt is using 4 percocet per day. She is asking if the quantity can be increased to last 30 days rather than 15. She has stopped using the marinol b/c it was not working.   S/w Dr Julien Nordmann and no increase of percocet.  Verified with [facility] that last mirtazapine rx they filled was in July and sent refill for this as well.

## 2017-01-29 NOTE — Telephone Encounter (Signed)
Scheduled appt per sch message - patient is aware of appt date and time - unable to do this week per patient. Scheduled next available.

## 2017-01-29 NOTE — Telephone Encounter (Signed)
Schedule message sent. 

## 2017-01-30 ENCOUNTER — Other Ambulatory Visit: Payer: Self-pay | Admitting: Radiation Oncology

## 2017-01-30 MED FILL — levETIRAcetam 1000 MG TABS: 1000 | 34 days supply | Qty: 68 | Fill #2

## 2017-01-31 ENCOUNTER — Telehealth: Payer: Self-pay | Admitting: *Deleted

## 2017-01-31 MED FILL — fentaNYL 100 MCG/HR PT72: 100 | 15 days supply | Qty: 5 | Fill #0

## 2017-01-31 MED FILL — OXYCODONE-ACETAMINOPHEN 10-: 10-325 | 15 days supply | Qty: 60 | Fill #0

## 2017-01-31 NOTE — Telephone Encounter (Signed)
Patient called requesting refill for Fentanyl, Percocet and Mirt___ for sleep.  Return number when ready for pick up is [phone].  I need to arrange transportation."  Called patient message left that two pain medicine refills were prepared 01-29-2017 and ready for pick up.

## 2017-02-01 ENCOUNTER — Telehealth: Payer: Self-pay | Admitting: *Deleted

## 2017-02-01 NOTE — Telephone Encounter (Signed)
xxxx 

## 2017-02-01 NOTE — Telephone Encounter (Signed)
Called patient to inform of CT for 02-05-17 - arrival time - 7:45 am, pt.to be NPO- 4 hrs. Prior to test, pt. Needs to pick -up contrast on 02-02-17, test to be @ [facility], lvm for a return cal

## 2017-02-02 ENCOUNTER — Telehealth: Payer: Self-pay | Admitting: *Deleted

## 2017-02-02 NOTE — Telephone Encounter (Signed)
Called patient to inform of CT being moved to 02-14-17 @ [facility], spoke with patient and she is aware of this change and is good with this change

## 2017-02-05 ENCOUNTER — Ambulatory Visit (HOSPITAL_BASED_OUTPATIENT_CLINIC_OR_DEPARTMENT_OTHER): Payer: Medicaid Other | Admitting: Oncology

## 2017-02-05 ENCOUNTER — Other Ambulatory Visit (HOSPITAL_BASED_OUTPATIENT_CLINIC_OR_DEPARTMENT_OTHER): Payer: Medicaid Other

## 2017-02-05 ENCOUNTER — Telehealth: Payer: Self-pay | Admitting: Neurology

## 2017-02-05 ENCOUNTER — Ambulatory Visit (HOSPITAL_COMMUNITY): Payer: Medicaid Other

## 2017-02-05 ENCOUNTER — Encounter: Payer: Self-pay | Admitting: Oncology

## 2017-02-05 VITALS — BP 93/63 | HR 118 | Temp 97.7°F | Resp 18 | Wt 99.6 lb

## 2017-02-05 DIAGNOSIS — C3491 Malignant neoplasm of unspecified part of right bronchus or lung: Secondary | ICD-10-CM | POA: Diagnosis present

## 2017-02-05 DIAGNOSIS — C7951 Secondary malignant neoplasm of bone: Secondary | ICD-10-CM

## 2017-02-05 DIAGNOSIS — Z5111 Encounter for antineoplastic chemotherapy: Secondary | ICD-10-CM

## 2017-02-05 DIAGNOSIS — R634 Abnormal weight loss: Secondary | ICD-10-CM

## 2017-02-05 DIAGNOSIS — C7931 Secondary malignant neoplasm of brain: Secondary | ICD-10-CM | POA: Diagnosis not present

## 2017-02-05 DIAGNOSIS — Z7189 Other specified counseling: Secondary | ICD-10-CM

## 2017-02-05 LAB — COMPREHENSIVE METABOLIC PANEL
ALT: 8 U/L (ref 0–55)
AST: 17 U/L (ref 5–34)
Albumin: 2.7 g/dL — ABNORMAL LOW (ref 3.5–5.0)
Alkaline Phosphatase: 100 U/L (ref 40–150)
Anion Gap: 11 mEq/L (ref 3–11)
BILIRUBIN TOTAL: 0.5 mg/dL (ref 0.20–1.20)
BUN: 7.1 mg/dL (ref 7.0–26.0)
CO2: 26 meq/L (ref 22–29)
Calcium: 9.7 mg/dL (ref 8.4–10.4)
Chloride: 95 mEq/L — ABNORMAL LOW (ref 98–109)
Creatinine: 0.6 mg/dL (ref 0.6–1.1)
GLUCOSE: 84 mg/dL (ref 70–140)
Potassium: 3.8 mEq/L (ref 3.5–5.1)
SODIUM: 132 meq/L — AB (ref 136–145)
TOTAL PROTEIN: 7.6 g/dL (ref 6.4–8.3)

## 2017-02-05 LAB — CBC WITH DIFFERENTIAL/PLATELET
BASO%: 0.7 % (ref 0.0–2.0)
Basophils Absolute: 0 10*3/uL (ref 0.0–0.1)
EOS%: 0.5 % (ref 0.0–7.0)
Eosinophils Absolute: 0 10*3/uL (ref 0.0–0.5)
HCT: 29 % — ABNORMAL LOW (ref 34.8–46.6)
HGB: 9.7 g/dL — ABNORMAL LOW (ref 11.6–15.9)
LYMPH%: 13.6 % — AB (ref 14.0–49.7)
MCH: 30.1 pg (ref 25.1–34.0)
MCHC: 33.5 g/dL (ref 31.5–36.0)
MCV: 89.7 fL (ref 79.5–101.0)
MONO#: 0.5 10*3/uL (ref 0.1–0.9)
MONO%: 13.6 % (ref 0.0–14.0)
NEUT%: 71.6 % (ref 38.4–76.8)
NEUTROS ABS: 2.4 10*3/uL (ref 1.5–6.5)
Platelets: 440 10*3/uL — ABNORMAL HIGH (ref 145–400)
RBC: 3.24 10*6/uL — AB (ref 3.70–5.45)
RDW: 16.9 % — ABNORMAL HIGH (ref 11.2–14.5)
WBC: 3.3 10*3/uL — AB (ref 3.9–10.3)
lymph#: 0.5 10*3/uL — ABNORMAL LOW (ref 0.9–3.3)

## 2017-02-05 MED ORDER — LEVETIRACETAM 1000 MG PO TABS: 1000.0000 mg | ORAL_TABLET | Freq: Two times a day (BID) | ORAL | 4 refills | Status: AC

## 2017-02-05 NOTE — Telephone Encounter (Signed)
According to history refilled on 08/25/16 for 1 year

## 2017-02-05 NOTE — Telephone Encounter (Signed)
I refill her keppra 1000mg  bid. 180 tablets with 4 refill

## 2017-02-05 NOTE — Progress Notes (Signed)
[facility] Follow up:    Andrea Ripple, MD [place] [id number]   DIAGNOSIS: Stage IV (T1a, N2, M1b) non-small cell lung cancer, adenocarcinoma with negative EGFR mutation and presented with a right hilar mass, subcarinal lymphadenopathy as well as a large soft tissue mass in the posterior iliac bone and metastatic bone lesions diagnosed in August 2016.  SUMMARY OF ONCOLOGIC HISTORY:  No history exists.   PRIOR THERAPY: 1) Status post palliative radiotherapy to the soft tissue mass in the left iliac bone under the care of Dr. Valere Dross. 2) Systemic chemotherapy with carboplatin for AUC of 5 and Alimta 500 MG/M2 every 3 weeks. First dose expected on 02/22/2015. Status post 6 cycles. Starting from cycle #3 Alimta was reduced to 400 MG/M2 secondary to neutropenia after the first 2 cycles. 3) Maintenance systemic chemotherapy with single agent Alimta 500 MG/M2 every 3 weeks. First dose 07/19/2015. Status post 3 cycles. Last dose was given 08/30/2015 discontinued secondary to disease progression. 4) Immunotherapy treatment with Tecentriq (Atezolizumab) 1200 MG IV every 3 weeks, for his dose 10/14/2015. Status post 6 cycles, discontinued secondary to disease progression. 5) whole brain irradiation for multiple new brain metastasis expected to be completed on 02/12/2016. 6) palliative radiotherapy to the progressive bone lesion in the left hip. 7) Systemic chemotherapy with docetaxel 75 MG/M2 and Cyramza 10 MG/KG every 3 weeks status post 5 cycles. Discontinued today secondary to disease progression. 8) Systemic chemotherapy with gemcitabine 1000 MG/M2 on days 1 and 8 every 3 weeks. First dose 07/12/2016. Status post 5 cycles. Discontinued today secondary to disease progression. 9) palliative radiotherapy to the bilateral adrenal glands under the care of Dr. Tammi Klippel.  CURRENT THERAPY: 1) Navelbine being 25 mg/m given every 2 weeks. Treatment will start on  02/15/2017. 2) Xgeva 120 mg subcutaneously on monthly basis for metastatic bone disease  INTERVAL HISTORY: Andrea [name] 46 y.o. female returns for routine follow-up accompanied by her mother. The patient was brought to the clinic by EMS on a stretcher. The patient has several issues today including increased fatigue and weakness and ongoing pain to her lower back and right hip. She still has a poor appetite and is currently on Marinol twice a day.She continues to lose weight. She has asked about increasing her Marinol to 4 times a day. She has ongoing nausea and uses anti-emetics which are minimally effective. The patient denied having any current chest pain or shortness of breath with no cough or hemoptysis. She has no fever or chills. She denied having any headache or visual changes. The patient was found on previous imaging studies to have evidence for disease progression. She also had palpable nodules under her skin concerning for metastatic disease. The patient has another restaging CT scan scheduled for 02/14/2017. She is here today for reevaluation and discussion of treatment options.   Patient Active Problem List   Diagnosis Date Noted  . Goals of care, counseling/discussion 02/05/2017  . 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 Swelling of right side of face 05/22/2016  . Nausea with vomiting 05/03/2016  . Opioid dependence ([place]) 04/27/2016  . Metastasis to brain ([place]) 02/01/2016  . Cerebral edema ([place]) 02/01/2016  . Midline shift of brain 02/01/2016  . Seizures ([place]) 02/01/2016  . Unintentional weight loss 10/25/2015  . Chronic fatigue 10/07/2015  . Adrenal mass, left ([place]) 09/21/2015  . Port  catheter in place 08/31/2015  . Encounter for antineoplastic chemotherapy 07/12/2015  . Cancer associated pain 05/11/2015  . Malnutrition ([place]) 01/25/2015  . Non-small cell carcinoma of  right lung, stage 4 ([place]) 01/01/2015  . Bone metastases ([place]) 12/30/2014  . Hypokalemia 12/29/2014  . Abnormal MRI 11/30/2014  . S/P lumbar laminectomy 05/01/2014  . Annular disc tear 07/09/2013  . Depression 05/14/2013  . Depo-Provera contraceptive status 01/15/2012  . Degenerative disc disease, lumbar 11/29/2010  . Dysmenorrhea 12/06/2009    is allergic to morphine and related and hydrocodone.  MEDICAL HISTORY: Past Medical History:  Diagnosis Date  . Adrenal mass, left ([place]) 09/21/2015  . Anginal pain ([place])    chest pain not sure what related to  . Anxiety   . Bone cancer ([place])    lung ca with bone mets  . Chronic back pain   . Depression   . Dysmenorrhea 12/06/2009   Qualifier: Diagnosis of  By: [name] RN, Regino Schultze    . Encounter for antineoplastic immunotherapy 10/07/2015  . Family history of adverse reaction to anesthesia    mother stopped breathing after surgery  . Goals of care, counseling/discussion 05/03/2016  . Headache    stress  . Low back pain radiating to left leg 11/29/2010  . Non-small cell carcinoma of right lung, stage 4 ([place]) 01/01/2015  . S/P radiation therapy 01/01/2015 through 01/21/2015     Left iliac bone 3500 cGy in 14 sessions   . Seizure ([place])   . Shortness of breath dyspnea    with exertion, Pt denies 02/2015    SURGICAL HISTORY: Past Surgical History:  Procedure Laterality Date  . IR RADIOLOGIST EVAL & MGMT  10/31/2016  . laproscopy surgery to check her tubes    . LUMBAR LAMINECTOMY/DECOMPRESSION MICRODISCECTOMY Left 05/01/2014   Procedure: Microdiscectomy - L5-S1 - left;  Surgeon: Eustace Moore, MD;  Location: [facility];  Service: Neurosurgery;  Laterality: Left;    SOCIAL HISTORY: Social History   Social History  . Marital status: Single    Spouse name: N/A  . Number of children: 0  . Years of education: Associates   Occupational History  . Unemployed    Social History  Main Topics  . Smoking status: Former Smoker    Packs/day: 0.50    Years: 25.00    Types: Cigarettes, E-cigarettes    Quit date: 12/29/2014  . Smokeless tobacco: Never Used     Comment: Smoking very little.  . Alcohol use 0.0 oz/week     Comment: Rarely  . Drug use: No     Comment: previoulsly.  [name] Sexual activity: Not Currently   Other Topics Concern  . Not on file   Social History Narrative   Lives at home alone.   Right-handed.   No caffeine use.    FAMILY HISTORY: Family History  Problem Relation Age of Onset  . Diabetes Mother   . Hypertension Mother   . Hypertension Father   . Heart attack Maternal Grandfather   . Hypertension Maternal Grandfather   . Heart attack Paternal Grandfather   . Hypertension Paternal Grandfather   . Breast cancer Maternal Aunt   . Breast cancer Maternal Aunt     Review of Systems  Constitutional: Positive for appetite change, fatigue and unexpected weight change. Negative for chills, diaphoresis and fever.  HENT:  Negative.   Eyes: Negative.   Respiratory: Negative for cough, hemoptysis and shortness of breath.   Cardiovascular: Negative.   Gastrointestinal: Positive for nausea and  vomiting. Negative for constipation and diarrhea.  Genitourinary: Negative.    Musculoskeletal: Positive for back pain.       Right hip pain. She has multiple subcutaneous nodules on her arms, legs, and abdomen that are painful.  Skin: Negative.   Neurological: Negative.   Hematological: Negative.   Psychiatric/Behavioral: Negative.       PHYSICAL EXAMINATION  ECOG PERFORMANCE STATUS: 2 - Symptomatic, <50% confined to bed  Vitals:   02/05/17 1124  BP: 93/63  Pulse: (!) 118  Resp: 18  Temp: 97.7 F (36.5 C)  SpO2: 100%    Physical Exam  Constitutional: She is oriented to person, place, and time.  Thin, cachectic female who is in no acute distress.  HENT:  Head: Normocephalic and atraumatic.  Mouth/Throat: Oropharynx is clear and moist. No  oropharyngeal exudate.  Eyes: Conjunctivae are normal. Right eye exhibits no discharge. Left eye exhibits no discharge. No scleral icterus.  Neck: Normal range of motion. Neck supple.  Cardiovascular: Regular rhythm, normal heart sounds and intact distal pulses.   tachycardic  Pulmonary/Chest: Effort normal and breath sounds normal. No respiratory distress. She has no wheezes. She has no rales.  Abdominal: Soft. Bowel sounds are normal. She exhibits no distension and no mass. There is no tenderness.  Musculoskeletal: Normal range of motion. She exhibits no edema.  Lymphadenopathy:    She has no cervical adenopathy.  Neurological: She is alert and oriented to person, place, and time. She exhibits normal muscle tone. Gait normal. Coordination normal.  Skin: Skin is warm and dry. No rash noted. No erythema.  The patient has subcutaneous nodules noted on the back of her neck, bilateral arms, abdomen, and bilateral legs.  Vitals reviewed.   LABORATORY DATA:  CBC    Component Value Date/Time   WBC 3.3 (L) 02/05/2017 1117   WBC 2.9 (L) 03/08/2015 1100   RBC 3.24 (L) 02/05/2017 1117   RBC 3.43 (L) 03/08/2015 1100   HGB 9.7 (L) 02/05/2017 1117   HCT 29.0 (L) 02/05/2017 1117   PLT 440 (H) 02/05/2017 1117   MCV 89.7 02/05/2017 1117   MCH 30.1 02/05/2017 1117   MCH 32.4 03/08/2015 1100   MCHC 33.5 02/05/2017 1117   MCHC 33.5 03/08/2015 1100   RDW 16.9 (H) 02/05/2017 1117   LYMPHSABS 0.5 (L) 02/05/2017 1117   MONOABS 0.5 02/05/2017 1117   EOSABS 0.0 02/05/2017 1117   BASOSABS 0.0 02/05/2017 1117    CMP     Component Value Date/Time   NA 132 (L) 02/05/2017 1117   K 3.8 02/05/2017 1117   CL 108 (H) 05/22/2016 1546   CO2 26 02/05/2017 1117   GLUCOSE 84 02/05/2017 1117   BUN 7.1 02/05/2017 1117   CREATININE 0.6 02/05/2017 1117   CALCIUM 9.7 02/05/2017 1117   PROT 7.6 02/05/2017 1117   ALBUMIN 2.7 (L) 02/05/2017 1117   AST 17 02/05/2017 1117   ALT 8 02/05/2017 1117   ALKPHOS 100  02/05/2017 1117   BILITOT 0.50 02/05/2017 1117   GFRNONAA 114 05/22/2016 1546   GFRNONAA 85 01/08/2014 1637   GFRAA 131 05/22/2016 1546   GFRAA >89 01/08/2014 1637   RADIOGRAPHIC STUDIES:  No results found.  ASSESSMENT and THERAPY PLAN:   Non-small cell carcinoma of right lung, stage 4 (HCC) This is a very pleasant 46 year old African-American female with metastatic non-small cell lung cancer, adenocarcinoma with bone and brain metastasis. The patient underwent several treatments in the past including induction systemic chemotherapy with carboplatin  and Alimta discontinued secondary to intolerance followed by maintenance treatment with single agent Alimta discontinued secondary to disease progression followed by treatment with immunotherapy with Tecentriq (Atezolizumab) for 6 cycles discontinued secondary to disease progression. The patient was then started on treatment with docetaxel and Cyramza discontinued secondary to disease progression. She also had palliative radiation to the brain for metastatic disease to the brain. She was treated with systemic chemotherapy with single agent gemcitabine status post 5 cycles discontinued secondary to disease progression. She had repeat CT scan of the chest, abdomen and pelvis performed in August 2018 which showed evidence for disease progression with enlargement of the bilateral adrenal lesions. The patient underwent radiation to the adrenal lesions.  The patient was seen with Dr. Julien Nordmann. We had a lengthy discussion with the patient and her mother today about her current condition and treatment options.  Unfortunately the patient had significant disease progression to the point that further systemic chemotherapy may not be effective. We previously outlined a referral to hospice for palliation of symptoms versus single agent Navelbine. The patient has indicated she does not want to give up and wants to try the Navelbine. Again explained to the patient  that the chemotherapy may not be effective but the patient still wants to proceed.Reviewed adverse effects of the treatment including, but not limited to, myelosuppression, and nausea, vomiting. The patient understands this and is in agreement to proceeding. The patient has CT scans scheduled for October 24 and this will serve as her new baseline. Plan to begin her chemotherapy on 02/15/2017.  For the weight loss, she will continue on Marinol. She was advised that she is to take this only twice a day and not increase to 4 times a day. She will also continue on Remeron. The patient states she did not do well with the Medrol Dosepak that she received previously. It made her have difficulty sleeping. We will not consider Megace because of the high-risk of deep venous thrombosis and pulmonary embolism in a patient with very limited ambulation.  For pain management, she we will continue the fentanyl patch and Percocet. For the persistent nausea, the patient will continue Zofran, Compazine, and Zyprexa 10 mg by mouth daily at bedtime.  The patient will be seen back for follow-up for evaluation prior to cycle 2 of her chemotherapy.  She was advised to call if she has any other concerning symptoms in the interval. The patient voices understanding of current disease status and treatment options and is in agreement with the current care plan. All questions were answered. The patient knows to call the clinic with any problems, questions or concerns. We can certainly see the patient much sooner if necessary.   Orders Placed This Encounter  Procedures  . CBC with Differential/Platelet    Standing Status:   Standing    Number of Occurrences:   10    Standing Expiration Date:   02/06/2018  . Comprehensive metabolic panel    Standing Status:   Standing    Number of Occurrences:   10    Standing Expiration Date:   02/06/2018    All questions were answered. The patient knows to call the clinic with any  problems, questions or concerns. We can certainly see the patient much sooner if necessary.  Mikey Bussing, NP 02/06/2017   ADDENDUM: Hematology/Oncology Attending: I had a face to face encounter with the patient. I recommended her care plan. This is a very pleasant 46 years old African-American female with stage IV  non-small cell lung cancer, adenocarcinoma status post several chemotherapy regimens as well as treatment with immunotherapy. Unfortunately the patient continues to have evidence for disease progression with significant worsening of her condition. She has been observation for the last few months. I had a lengthy discussion with the patient today as well as previously about her current disease status and treatment options. I strongly recommend for the patient to consider palliative care and hospice at this point but the patient is still in denial and she would like to try more treatment. I explained to the patient that one of the other option left for treatment of her condition would be treatment with Navelbine 25 mg/M2 every 2 weeks. I also explained to the patient that the likelihood of this treatment is working is very small. I discussed with her the adverse effect of this treatment including but not limited to alopecia, myelosuppression, nausea and vomiting, peripheral neuropathy, liver or renal dysfunction. The patient is interested in proceeding with the treatment. She is expected to start the first cycle of this treatment in less than 2 weeks after repeating CT scan of the chest, abdomen and pelvis for restaging of her disease before the first cycle of this treatment. The patient was advised to call immediately if she has any concerning symptoms in the interval. For pain management she will continue with the current pain medication. For the weight loss and lack of appetite she is currently on Marinol. Disclaimer: This note was dictated with voice recognition software. Similar  sounding words can inadvertently be transcribed and may be missed upon review. Eilleen Kempf, MD 02/06/17

## 2017-02-06 NOTE — Assessment & Plan Note (Signed)
This is a very pleasant 46 year old African-American female with metastatic non-small cell lung cancer, adenocarcinoma with bone and brain metastasis. The patient underwent several treatments in the past including induction systemic chemotherapy with carboplatin and Alimta discontinued secondary to intolerance followed by maintenance treatment with single agent Alimta discontinued secondary to disease progression followed by treatment with immunotherapy with Tecentriq (Atezolizumab) for 6 cycles discontinued secondary to disease progression. The patient was then started on treatment with docetaxel and Cyramza discontinued secondary to disease progression. She also had palliative radiation to the brain for metastatic disease to the brain. She was treated with systemic chemotherapy with single agent gemcitabine status post 5 cycles discontinued secondary to disease progression. She had repeat CT scan of the chest, abdomen and pelvis performed in August 2018 which showed evidence for disease progression with enlargement of the bilateral adrenal lesions. The patient underwent radiation to the adrenal lesions.  The patient was seen with Dr. Julien Nordmann. We had a lengthy discussion with the patient and her mother today about her current condition and treatment options.  Unfortunately the patient had significant disease progression to the point that further systemic chemotherapy may not be effective. We previously outlined a referral to hospice for palliation of symptoms versus single agent Navelbine. The patient has indicated she does not want to give up and wants to try the Navelbine. Again explained to the patient that the chemotherapy may not be effective but the patient still wants to proceed.Reviewed adverse effects of the treatment including, but not limited to, myelosuppression, and nausea, vomiting. The patient understands this and is in agreement to proceeding. The patient has CT scans scheduled for October 24  and this will serve as her new baseline. Plan to begin her chemotherapy on 02/15/2017.  For the weight loss, she will continue on Marinol. She was advised that she is to take this only twice a day and not increase to 4 times a day. She will also continue on Remeron. The patient states she did not do well with the Medrol Dosepak that she received previously. It made her have difficulty sleeping. We will not consider Megace because of the high-risk of deep venous thrombosis and pulmonary embolism in a patient with very limited ambulation.  For pain management, she we will continue the fentanyl patch and Percocet. For the persistent nausea, the patient will continue Zofran, Compazine, and Zyprexa 10 mg by mouth daily at bedtime.  The patient will be seen back for follow-up for evaluation prior to cycle 2 of her chemotherapy.  She was advised to call if she has any other concerning symptoms in the interval. The patient voices understanding of current disease status and treatment options and is in agreement with the current care plan. All questions were answered. The patient knows to call the clinic with any problems, questions or concerns. We can certainly see the patient much sooner if necessary.

## 2017-02-07 ENCOUNTER — Ambulatory Visit (INDEPENDENT_AMBULATORY_CARE_PROVIDER_SITE_OTHER): Payer: Medicaid Other | Admitting: *Deleted

## 2017-02-07 ENCOUNTER — Telehealth: Payer: Self-pay | Admitting: Internal Medicine

## 2017-02-07 ENCOUNTER — Telehealth: Payer: Self-pay | Admitting: Oncology

## 2017-02-07 DIAGNOSIS — N946 Dysmenorrhea, unspecified: Secondary | ICD-10-CM | POA: Diagnosis not present

## 2017-02-07 MED ORDER — MEDROXYPROGESTERONE ACETATE 150 MG/ML IM SUSP
150.0000 mg | Freq: Once | INTRAMUSCULAR | Status: AC
  Administered 2017-02-07: 150 mg via INTRAMUSCULAR

## 2017-02-07 NOTE — Telephone Encounter (Signed)
Scheduled appt per 10/15 los - unable to schedule for 10/25 due to capped day - msg Cindy and waiting on response to call patient .

## 2017-02-07 NOTE — Telephone Encounter (Signed)
Scheduled appt per 10/15 los - Andrea [name] confirmed okay to add 10/25 appt . Left message with appt date and time.

## 2017-02-13 ENCOUNTER — Other Ambulatory Visit: Payer: Self-pay | Admitting: *Deleted

## 2017-02-13 DIAGNOSIS — C3490 Malignant neoplasm of unspecified part of unspecified bronchus or lung: Secondary | ICD-10-CM

## 2017-02-13 MED ORDER — LIDOCAINE-PRILOCAINE 2.5-2.5 % EX CREA: TOPICAL_CREAM | CUTANEOUS | 99 refills | Status: DC

## 2017-02-13 MED FILL — LIDOCAINE-PRILOCAINE CREAM: 2.5-2.5 | 10 days supply | Qty: 30 | Fill #0

## 2017-02-14 ENCOUNTER — Ambulatory Visit (HOSPITAL_COMMUNITY): Payer: Medicaid Other

## 2017-02-14 ENCOUNTER — Ambulatory Visit: Payer: Medicaid Other

## 2017-02-15 ENCOUNTER — Ambulatory Visit: Payer: Medicaid Other

## 2017-02-15 ENCOUNTER — Other Ambulatory Visit: Payer: Medicaid Other

## 2017-02-19 ENCOUNTER — Telehealth: Payer: Self-pay

## 2017-02-19 ENCOUNTER — Other Ambulatory Visit: Payer: Self-pay | Admitting: Medical Oncology

## 2017-02-19 ENCOUNTER — Telehealth: Payer: Self-pay | Admitting: Medical Oncology

## 2017-02-19 DIAGNOSIS — C7972 Secondary malignant neoplasm of left adrenal gland: Secondary | ICD-10-CM

## 2017-02-19 DIAGNOSIS — C801 Malignant (primary) neoplasm, unspecified: Secondary | ICD-10-CM

## 2017-02-19 DIAGNOSIS — C7951 Secondary malignant neoplasm of bone: Secondary | ICD-10-CM

## 2017-02-19 DIAGNOSIS — C3490 Malignant neoplasm of unspecified part of unspecified bronchus or lung: Secondary | ICD-10-CM

## 2017-02-19 MED ORDER — OXYCODONE-ACETAMINOPHEN 10-325 MG PO TABS: 1.0000 | ORAL_TABLET | Freq: Four times a day (QID) | ORAL | 0 refills | Status: DC | PRN

## 2017-02-19 MED ORDER — FENTANYL 100 MCG/HR TD PT72: 100.0000 ug | MEDICATED_PATCH | TRANSDERMAL | 0 refills | Status: DC

## 2017-02-19 MED ORDER — LIDOCAINE-PRILOCAINE 2.5-2.5 % EX CREA: TOPICAL_CREAM | CUTANEOUS | 99 refills | Status: AC

## 2017-02-19 MED FILL — OXYCODONE-ACETAMINOPHEN 10-: 10-325 | 15 days supply | Qty: 60 | Fill #0

## 2017-02-19 MED FILL — fentaNYL 100 MCG/HR PT72: 100 | 15 days supply | Qty: 5 | Fill #0

## 2017-02-19 MED FILL — LIDOCAINE-PRILOCAINE CREAM: 2.5-2.5 | 15 days supply | Qty: 30 | Fill #0

## 2017-02-19 NOTE — Telephone Encounter (Signed)
Pt notified to pick up prescriptions and confirmed that Julien Nordmann will see her on 11/8 after she gets her CT scan done on 11/1

## 2017-02-19 NOTE — Telephone Encounter (Signed)
Pt missed lab and infusion on 10/25. Does MD want to r/s those or wait until after CT on 11/1.  Asking for percocet and fentanyl refills

## 2017-02-19 NOTE — Telephone Encounter (Signed)
Pt called with question on schedule. Called back and LVM with schedule as it stands. Asked her to call back if more questions.

## 2017-02-22 ENCOUNTER — Ambulatory Visit (HOSPITAL_COMMUNITY)
Admission: RE | Admit: 2017-02-22 | Discharge: 2017-02-22 | Disposition: A | Discharge status: Home / Self Care | Payer: Medicaid Other | Source: Ambulatory Visit | Attending: Urology | Admitting: Urology

## 2017-02-22 DIAGNOSIS — C778 Secondary and unspecified malignant neoplasm of lymph nodes of multiple regions: Secondary | ICD-10-CM | POA: Insufficient documentation

## 2017-02-22 DIAGNOSIS — C7801 Secondary malignant neoplasm of right lung: Secondary | ICD-10-CM | POA: Insufficient documentation

## 2017-02-22 DIAGNOSIS — C787 Secondary malignant neoplasm of liver and intrahepatic bile duct: Secondary | ICD-10-CM | POA: Insufficient documentation

## 2017-02-22 DIAGNOSIS — C3490 Malignant neoplasm of unspecified part of unspecified bronchus or lung: Secondary | ICD-10-CM | POA: Diagnosis not present

## 2017-02-22 DIAGNOSIS — C3491 Malignant neoplasm of unspecified part of right bronchus or lung: Secondary | ICD-10-CM

## 2017-02-22 DIAGNOSIS — C7972 Secondary malignant neoplasm of left adrenal gland: Secondary | ICD-10-CM | POA: Insufficient documentation

## 2017-02-22 DIAGNOSIS — C7971 Secondary malignant neoplasm of right adrenal gland: Secondary | ICD-10-CM | POA: Insufficient documentation

## 2017-02-22 DIAGNOSIS — C7802 Secondary malignant neoplasm of left lung: Secondary | ICD-10-CM | POA: Insufficient documentation

## 2017-02-22 DIAGNOSIS — C7989 Secondary malignant neoplasm of other specified sites: Secondary | ICD-10-CM | POA: Insufficient documentation

## 2017-02-22 DIAGNOSIS — M899 Disorder of bone, unspecified: Secondary | ICD-10-CM | POA: Insufficient documentation

## 2017-02-22 MED ORDER — IOPAMIDOL (ISOVUE-300) INJECTION 61%
30.0000 mL | Freq: Once | INTRAVENOUS | Status: AC | PRN
  Administered 2017-02-22: 30 mL via ORAL

## 2017-02-22 MED ORDER — IOPAMIDOL (ISOVUE-300) INJECTION 61%
INTRAVENOUS | Status: AC
  Administered 2017-02-22: 30 mL via ORAL
  Filled 2017-02-22: qty 30

## 2017-02-22 MED ORDER — IOPAMIDOL (ISOVUE-300) INJECTION 61%
INTRAVENOUS | Status: AC
  Filled 2017-02-22: qty 100

## 2017-02-22 MED ORDER — HEPARIN SOD (PORK) LOCK FLUSH 100 UNIT/ML IV SOLN: 500.0000 [IU] | Freq: Once | INTRAVENOUS | Status: DC

## 2017-02-22 MED ORDER — HEPARIN SOD (PORK) LOCK FLUSH 100 UNIT/ML IV SOLN
INTRAVENOUS | Status: AC
  Administered 2017-02-22: 500 [IU]
  Filled 2017-02-22: qty 5

## 2017-02-22 MED ORDER — IOPAMIDOL (ISOVUE-300) INJECTION 61%
100.0000 mL | Freq: Once | INTRAVENOUS | Status: AC | PRN
  Administered 2017-02-22: 80 mL via INTRAVENOUS

## 2017-02-27 ENCOUNTER — Telehealth: Payer: Self-pay | Admitting: Urology

## 2017-02-27 NOTE — Telephone Encounter (Signed)
I attempted to contact the patient regarding results from her recent imaging studies on 02/22/17. I did leave a detailed message on her voicemail indicating that based on her new imaging, there did appear to be a positive response to the recent radiotherapy to the adrenal glands with a decrease in size of both of the adrenals. However, there does appear to be significant disease progression elsewhere involving the lungs, lymphatics and bony skeleton. I advised that this would be discussed in greater detail her upcoming visit with Dr. Julien Nordmann on Thursday, 03/01/2017. I did indicate that she is welcome to call me with any questions or concerns regarding this information prior to her appointment with Dr. Julien Nordmann on Thursday this week. Otherwise, we will plan to see her back as needed and/or based on recommendations from Dr. Julien Nordmann.   Nicholos Johns, PA-C

## 2017-03-01 ENCOUNTER — Ambulatory Visit (HOSPITAL_BASED_OUTPATIENT_CLINIC_OR_DEPARTMENT_OTHER): Payer: Medicaid Other | Admitting: Oncology

## 2017-03-01 ENCOUNTER — Ambulatory Visit (HOSPITAL_BASED_OUTPATIENT_CLINIC_OR_DEPARTMENT_OTHER): Payer: Medicaid Other

## 2017-03-01 ENCOUNTER — Other Ambulatory Visit (HOSPITAL_BASED_OUTPATIENT_CLINIC_OR_DEPARTMENT_OTHER): Payer: Medicaid Other

## 2017-03-01 VITALS — BP 89/63 | HR 101 | Temp 97.6°F | Resp 18

## 2017-03-01 DIAGNOSIS — Z5111 Encounter for antineoplastic chemotherapy: Secondary | ICD-10-CM

## 2017-03-01 DIAGNOSIS — C801 Malignant (primary) neoplasm, unspecified: Secondary | ICD-10-CM

## 2017-03-01 DIAGNOSIS — C3491 Malignant neoplasm of unspecified part of right bronchus or lung: Secondary | ICD-10-CM

## 2017-03-01 DIAGNOSIS — R112 Nausea with vomiting, unspecified: Secondary | ICD-10-CM

## 2017-03-01 DIAGNOSIS — C7931 Secondary malignant neoplasm of brain: Secondary | ICD-10-CM

## 2017-03-01 DIAGNOSIS — Z7189 Other specified counseling: Secondary | ICD-10-CM

## 2017-03-01 DIAGNOSIS — C7972 Secondary malignant neoplasm of left adrenal gland: Secondary | ICD-10-CM

## 2017-03-01 DIAGNOSIS — C7951 Secondary malignant neoplasm of bone: Secondary | ICD-10-CM | POA: Diagnosis not present

## 2017-03-01 DIAGNOSIS — C3490 Malignant neoplasm of unspecified part of unspecified bronchus or lung: Secondary | ICD-10-CM

## 2017-03-01 LAB — CBC WITH DIFFERENTIAL/PLATELET
BASO%: 0.2 % (ref 0.0–2.0)
Basophils Absolute: 0 10*3/uL (ref 0.0–0.1)
EOS%: 0.4 % (ref 0.0–7.0)
Eosinophils Absolute: 0 10*3/uL (ref 0.0–0.5)
HCT: 28.6 % — ABNORMAL LOW (ref 34.8–46.6)
HGB: 9.3 g/dL — ABNORMAL LOW (ref 11.6–15.9)
LYMPH%: 16.6 % (ref 14.0–49.7)
MCH: 30.4 pg (ref 25.1–34.0)
MCHC: 32.5 g/dL (ref 31.5–36.0)
MCV: 93.5 fL (ref 79.5–101.0)
MONO#: 0.6 10*3/uL (ref 0.1–0.9)
MONO%: 12.1 % (ref 0.0–14.0)
NEUT%: 70.7 % (ref 38.4–76.8)
NEUTROS ABS: 3.3 10*3/uL (ref 1.5–6.5)
Platelets: 358 10*3/uL (ref 145–400)
RBC: 3.06 10*6/uL — AB (ref 3.70–5.45)
RDW: 15.4 % — ABNORMAL HIGH (ref 11.2–14.5)
WBC: 4.6 10*3/uL (ref 3.9–10.3)
lymph#: 0.8 10*3/uL — ABNORMAL LOW (ref 0.9–3.3)

## 2017-03-01 LAB — COMPREHENSIVE METABOLIC PANEL
ALBUMIN: 2.4 g/dL — AB (ref 3.5–5.0)
ALK PHOS: 110 U/L (ref 40–150)
ALT: 7 U/L (ref 0–55)
AST: 19 U/L (ref 5–34)
Anion Gap: 8 mEq/L (ref 3–11)
BILIRUBIN TOTAL: 0.37 mg/dL (ref 0.20–1.20)
BUN: 8.7 mg/dL (ref 7.0–26.0)
CALCIUM: 9.7 mg/dL (ref 8.4–10.4)
CO2: 29 mEq/L (ref 22–29)
Chloride: 96 mEq/L — ABNORMAL LOW (ref 98–109)
Creatinine: 0.6 mg/dL (ref 0.6–1.1)
EGFR: >60 mL/min/{1.73_m2} (ref >60)
GLUCOSE: 87 mg/dL (ref 70–140)
POTASSIUM: 3.4 meq/L — AB (ref 3.5–5.1)
SODIUM: 134 meq/L — AB (ref 136–145)
TOTAL PROTEIN: 6.9 g/dL (ref 6.4–8.3)

## 2017-03-01 MED ORDER — SODIUM CHLORIDE 0.9 % IV SOLN
Freq: Once | INTRAVENOUS | Status: AC
  Administered 2017-03-01: 15:00:00 via INTRAVENOUS

## 2017-03-01 MED ORDER — PROCHLORPERAZINE MALEATE 10 MG PO TABS
10.0000 mg | ORAL_TABLET | Freq: Once | ORAL | Status: AC
  Administered 2017-03-01: 10 mg via ORAL

## 2017-03-01 MED ORDER — HEPARIN SOD (PORK) LOCK FLUSH 100 UNIT/ML IV SOLN
500.0000 [IU] | Freq: Once | INTRAVENOUS | Status: AC | PRN
  Administered 2017-03-01: 500 [IU]
  Filled 2017-03-01: qty 5

## 2017-03-01 MED ORDER — FENTANYL 100 MCG/HR TD PT72: 100.0000 ug | MEDICATED_PATCH | TRANSDERMAL | 0 refills | Status: DC

## 2017-03-01 MED ORDER — ONDANSETRON HCL 4 MG PO TABS: 4.0000 mg | ORAL_TABLET | Freq: Three times a day (TID) | ORAL | 1 refills | Status: AC | PRN

## 2017-03-01 MED ORDER — VINORELBINE TARTRATE CHEMO INJECTION 50 MG/5ML
25.0000 mg/m2 | Freq: Once | INTRAVENOUS | Status: AC
  Administered 2017-03-01: 36 mg via INTRAVENOUS
  Filled 2017-03-01: qty 3.6

## 2017-03-01 MED ORDER — PROCHLORPERAZINE MALEATE 10 MG PO TABS
ORAL_TABLET | ORAL | Status: AC
  Filled 2017-03-01: qty 1

## 2017-03-01 MED ORDER — SODIUM CHLORIDE 0.9% FLUSH
10.0000 mL | INTRAVENOUS | Status: DC | PRN
  Administered 2017-03-01: 10 mL
  Filled 2017-03-01: qty 10

## 2017-03-01 MED ORDER — OXYCODONE-ACETAMINOPHEN 10-325 MG PO TABS: 1.0000 | ORAL_TABLET | Freq: Four times a day (QID) | ORAL | 0 refills | Status: DC | PRN

## 2017-03-01 MED FILL — ONDANSETRON HCL 4 MG TABLET: 4 | 7 days supply | Qty: 20 | Fill #0

## 2017-03-01 NOTE — Progress Notes (Signed)
[place] OFFICE PROGRESS NOTE  [name], MD [place] [address]  DIAGNOSIS: Stage IV (T1a, N2, M1b) non-small cell lung cancer, adenocarcinoma with negative EGFR mutation and presented with a right hilar mass, subcarinal lymphadenopathy as well as a large soft tissue mass in the posterior iliac bone and metastatic bone lesions diagnosed in August 2016.  PRIOR THERAPY: 1) Status post palliative radiotherapy to the soft tissue mass in the left iliac bone under the care of Dr. Valere Dross. 2) Systemic chemotherapy with carboplatin for AUC of 5 and Alimta 500 MG/M2 every 3 weeks. First dose expected on 02/22/2015. Status post 6 cycles. Starting from cycle #3 Alimta was reduced to 400 MG/M2 secondary to neutropenia after the first 2 cycles. 3) Maintenance systemic chemotherapy with single agent Alimta 500 MG/M2 every 3 weeks. First dose 07/19/2015. Status post 3 cycles. Last dose was given 08/30/2015 discontinued secondary to disease progression. 4) Immunotherapy treatment with Tecentriq (Atezolizumab) 1200 MG IV every 3 weeks, for his dose 10/14/2015. Status post 6 cycles, discontinued secondary to disease progression. 5) whole brain irradiation for multiple new brain metastasis expected to be completed on 02/12/2016. 6) palliative radiotherapy to the progressive bone lesion in the left hip. 7) Systemic chemotherapy with docetaxel 75 MG/M2 and Cyramza 10 MG/KG every 3 weeks status post 5 cycles. Discontinued today secondary to disease progression. 8) Systemic chemotherapy with gemcitabine 1000 MG/M2 on days 1 and 8 every 3 weeks. First dose 07/12/2016. Status post 5 cycles. Discontinued today secondary to disease progression. 9) palliative radiotherapy to the bilateral adrenal glands under the care of Dr. Tammi Klippel.  CURRENT THERAPY: 1) Navelbine being 25 mg/m given every 2 weeks. Treatment will start on 03/01/2017. 2) Xgeva 120 mg subcutaneously on monthly basis  for metastatic bone disease  INTERVAL HISTORY: Andrea [name] 46 y.o. female returns for routine follow-up by herself.  She was seen in the infusion area.  She was brought to the clinic on EMS stretcher.  The patient has ongoing issues with fatigue and pain.  She notices increased nodules in her neck, arms, abdomen, and legs.  She is using a fentanyl patch and Percocet as needed.  The patient denies fevers and chills.  Denies chest pain, shortness breath, cough, hemoptysis.  She denies nausea, vomiting, constipation, diarrhea.  The patient is here today for evaluation prior to cycle 1 of her chemotherapy and to review her recent restaging CT scan results.  MEDICAL HISTORY: Past Medical History:  Diagnosis Date  . Adrenal mass, left ([place]) 09/21/2015  . Anginal pain ([place])    chest pain not sure what related to  . Anxiety   . Bone cancer ([place])    lung ca with bone mets  . Chronic back pain   . Depression   . Dysmenorrhea 12/06/2009   Qualifier: Diagnosis of  By: [name] RN, Regino Schultze    . Encounter for antineoplastic immunotherapy 10/07/2015  . Family history of adverse reaction to anesthesia    mother stopped breathing after surgery  . Goals of care, counseling/discussion 05/03/2016  . Headache    stress  . Low back pain radiating to left leg 11/29/2010  . Non-small cell carcinoma of right lung, stage 4 ([facility]) 01/01/2015  . S/P radiation therapy 01/01/2015 through 01/21/2015     Left iliac bone 3500 cGy in 14 sessions   . Seizure ([place])   . Shortness of breath dyspnea    with exertion, Pt denies 02/2015    ALLERGIES:  is allergic to morphine and related and hydrocodone.  MEDICATIONS:  Current Outpatient Medications  Medication Sig Dispense Refill  . diclofenac sodium (VOLTAREN) 1 % GEL Apply 4 g topically 4 (four) times daily. 200 g 1  . dronabinol (MARINOL) 2.5 MG capsule TAKE 1 CAPSULE BY MOUTH TWICE DAILY BEFORE A MEAL   0  . fentaNYL (DURAGESIC - DOSED MCG/HR) 100 MCG/HR Place 1 patch (100 mcg total) every 3 (three) days onto the skin. 10 patch 0  . levETIRAcetam (KEPPRA) 1000 MG tablet Take 1 tablet (1,000 mg total) by mouth 2 (two) times daily. 180 tablet 4  . levETIRAcetam (KEPPRA) 1000 MG tablet Take 1 tablet (1,000 mg total) by mouth 2 (two) times daily. 180 tablet 4  . lidocaine (LIDODERM) 5 % Place 1 patch onto the skin daily. Remove & Discard patch within 12 hours or as directed by MD 30 patch 0  . lidocaine-prilocaine (EMLA) cream Apply a small amount to skin over port site 1 to 2 hours before use cover to keep in contact with skin 30 g PRN  . loratadine (CLARITIN) 10 MG tablet Take 1 tablet (10 mg total) by mouth daily.    . methylPREDNISolone (MEDROL DOSEPAK) 4 MG TBPK tablet Use as instructed 21 tablet 0  . mirtazapine (REMERON) 30 MG tablet Take 1 tablet (30 mg total) by mouth at bedtime. 30 tablet 0  . Multiple Vitamins-Minerals (MULTIVITAMIN WITH MINERALS) tablet Take by mouth.    . OLANZapine (ZYPREXA) 10 MG tablet Take 1 tablet (10 mg total) by mouth at bedtime. (Patient not taking: Reported on 01/12/2017) 30 tablet 0  . ondansetron (ZOFRAN) 4 MG tablet Take 1 tablet (4 mg total) every 8 (eight) hours as needed by mouth for nausea or vomiting. 20 tablet 1  . oxyCODONE-acetaminophen (PERCOCET) 10-325 MG tablet Take 1 tablet every 6 (six) hours as needed by mouth for pain. 60 tablet 0  . potassium chloride SA (KLOR-CON M20) 20 MEQ tablet Take 1 tablet (20 mEq total) by mouth 2 (two) times daily. 60 tablet 0  . senna-docusate (SENOKOT-S) 8.6-50 MG tablet Take 2 tablets by mouth at bedtime. 30 tablet 0   No current facility-administered medications for this visit.     SURGICAL HISTORY:  Past Surgical History:  Procedure Laterality Date  . IR RADIOLOGIST EVAL & MGMT  10/31/2016  . laproscopy surgery to check her tubes      REVIEW OF SYSTEMS:   Review of Systems  Constitutional: Negative for  appetite change, chills. Positive for fatigue. HENT:   Negative for mouth sores, nosebleeds, sore throat and trouble swallowing.   Eyes: Negative for eye problems and icterus.  Respiratory: Negative for cough, hemoptysis, shortness of breath and wheezing.   Cardiovascular: Negative for chest pain and leg swelling.  Gastrointestinal: Negative for abdominal pain, constipation, diarrhea, nausea and vomiting.  Genitourinary: Negative for bladder incontinence, difficulty urinating, dysuria, frequency and hematuria.   Musculoskeletal: Negative for back pain, gait problem, neck pain and neck stiffness.  Skin: Negative for itching and rash.  Neurological: Negative for dizziness, extremity weakness, gait problem, headaches, light-headedness and seizures.  Hematological: Negative for adenopathy. Does not bruise/bleed easily.  Psychiatric/Behavioral: Negative for confusion, depression and sleep disturbance. The patient is not nervous/anxious.     PHYSICAL EXAMINATION:   Vital signs: Temp 97.6, pulse 101, blood pressure 89/63, respirations 18, O2 sat 100% on room air  ECOG PERFORMANCE STATUS: 2 - Symptomatic, <50% confined to bed  Physical Exam  Constitutional: Oriented to  person, place, and time.  Thin, frail female who is in no acute distress. HENT:  Head: Normocephalic and atraumatic.  Mouth/Throat: Oropharynx is clear and moist. No oropharyngeal exudate.  Eyes: Conjunctivae are normal. Right eye exhibits no discharge. Left eye exhibits no discharge. No scleral icterus.  Neck: Normal range of motion. Neck supple.  Cardiovascular: Normal rate, regular rhythm, normal heart sounds and intact distal pulses.   Pulmonary/Chest: Effort normal and breath sounds normal. No respiratory distress. No wheezes. No rales.  Abdominal: Soft. Bowel sounds are normal. Exhibits no distension and no mass. There is no tenderness.  Musculoskeletal: Normal range of motion. Exhibits no edema.  Lymphadenopathy:    No  cervical adenopathy.  Neurological: Alert and oriented to person, place, and time. Exhibits normal muscle tone. Gait normal. Coordination normal.  Skin: Skin is warm and dry. No rash noted. Not diaphoretic. No erythema. No pallor. She has multiple subcutaneous nodules noted on her neck, arms, abdomen, and legs. Psychiatric: Mood, memory and judgment normal.  Vitals reviewed.  LABORATORY DATA: Lab Results  Component Value Date   WBC 4.6 03/01/2017   HGB 9.3 (L) 03/01/2017   HCT 28.6 (L) 03/01/2017   MCV 93.5 03/01/2017   PLT 358 03/01/2017      Chemistry      Component Value Date/Time   NA 134 (L) 03/01/2017 1328   K 3.4 (L) 03/01/2017 1328   CL 108 (H) 05/22/2016 1546   CO2 29 03/01/2017 1328   BUN 8.7 03/01/2017 1328   CREATININE 0.6 03/01/2017 1328      Component Value Date/Time   CALCIUM 9.7 03/01/2017 1328   ALKPHOS 110 03/01/2017 1328   AST 19 03/01/2017 1328   ALT 7 03/01/2017 1328   BILITOT 0.37 03/01/2017 1328       RADIOGRAPHIC STUDIES:  Ct Chest W Contrast  Result Date: 02/23/2017 CLINICAL DATA:  Metastatic lung cancer. EXAM: CT CHEST, ABDOMEN, AND PELVIS WITH CONTRAST TECHNIQUE: Multidetector CT imaging of the chest, abdomen and pelvis was performed following the standard protocol during bolus administration of intravenous contrast. CONTRAST:  21m ISOVUE-300 IOPAMIDOL (ISOVUE-300) INJECTION 61% COMPARISON:  CT 10/09/2016 FINDINGS: CT CHEST FINDINGS Cardiovascular: The heart size appears normal. No pericardial effusion. Mediastinum/Nodes: The trachea remains patent. Normal appearance of the esophagus. New large left supraclavicular nodal mass measures 4.7 x 3.3 cm, image 4 of series 2. Adjacent left supraclavicular mass measures 3.0 by 2.4 cm, image number 3 of series 2. New mediastinal adenopathy. Right paratracheal lymph node measures 2.1 cm, image 23 of series 2. Left paratracheal nodal mass measures 1.6 cm, image 23 of series 2. New sub- carinal lymph node  measures 1 cm, image 30 of series 2. Lungs/Pleura: Moderate changes of centrilobular emphysema. Right perihilar lung mass measures 2.9 cm, image 37 of series 2. Previously 1.8 cm. New left perihilar lung mass measures 2.9 by 3.4 cm, image 91 of series 6. New left lower lobe lung nodule measures 0.9 x 0.7 cm, image 117 of series 6. Musculoskeletal: Within the left breast there is a peripherally enhancing lesion measuring 1.4 cm, image 37 of series 2. New from previous exam. Also new is a left superior breast lesion measuring 1.3 cm, image 17 of series 2. CT ABDOMEN PELVIS FINDINGS Hepatobiliary: Scattered low-attenuation lesions are identified within the liver. New from previous exam. Within the posterior right lobe index lesion measures 0.8 cm, image 60 of series 2. Also in the right lobe of liver is a 0.7 cm lesion, image 62  of series 2. Gallbladder normal. No biliary dilatation. Pancreas: Pancreas normal. Spleen: No splenic metastases. Adrenals/Urinary Tract: Right adrenal mass measures 4.5 x 2.0 cm, image 56 of series 2. Previously 4.8 x 2.5 cm. Left adrenal lesion measures 2.1 x 1.6 cm, image 55 of series 2. Previously 2.9 x 1.9 cm. Bilateral kidney cysts. No hydronephrosis. Urinary bladder appears normal. Stomach/Bowel: The stomach is unremarkable. No pathologic dilatation of the large or small bowel loops. Vascular/Lymphatic: Aortic atherosclerosis. No aneurysm. New retroperitoneal adenopathy. Index aortocaval node at the level of the left renal vein measures 1.7 cm, image 64 of series 2. New from previous exam. Also new is a left periaortic node measuring 1.9 cm, image 64 of series 2. Reproductive: Uterus and bilateral adnexa are unremarkable. Other: Enhancing soft tissue nodule along the left pelvic sidewall is new from previous exam measuring 1.8 cm, image 99 of series 2. Musculoskeletal: Multifocal abdominal wall metastatic implants identified. The largest is at the level of the umbilicus on the left  measuring 3 cm, image 80 of series 2. Lytic bone lesion within the right iliac bone measures 1.6 cm, image 88 of series 2. New from previous exam. Mixed lytic and sclerotic bone metastasis within the left iliac bone measures 2.8 cm, image 93 of series 2. Unchanged from previous exam. IMPRESSION: 1. Overall interval progression of disease. 2. New left supraclavicular, mediastinal, abdominal and pelvic nodal metastases. 3. New bilateral pulmonary metastasis 4. Interval development of multifocal body wall subcutaneous metastasis. 5. New lytic lesion identified within the right iliac bone. 6. New liver metastasis 7. Mild regression of bilateral adrenal gland metastasis. Electronically Signed   By: Kerby Moors M.D.   On: 02/23/2017 09:19   Ct Abdomen Pelvis W Contrast  Result Date: 02/23/2017 CLINICAL DATA:  Metastatic lung cancer. EXAM: CT CHEST, ABDOMEN, AND PELVIS WITH CONTRAST TECHNIQUE: Multidetector CT imaging of the chest, abdomen and pelvis was performed following the standard protocol during bolus administration of intravenous contrast. CONTRAST:  35m ISOVUE-300 IOPAMIDOL (ISOVUE-300) INJECTION 61% COMPARISON:  CT 10/09/2016 FINDINGS: CT CHEST FINDINGS Cardiovascular: The heart size appears normal. No pericardial effusion. Mediastinum/Nodes: The trachea remains patent. Normal appearance of the esophagus. New large left supraclavicular nodal mass measures 4.7 x 3.3 cm, image 4 of series 2. Adjacent left supraclavicular mass measures 3.0 by 2.4 cm, image number 3 of series 2. New mediastinal adenopathy. Right paratracheal lymph node measures 2.1 cm, image 23 of series 2. Left paratracheal nodal mass measures 1.6 cm, image 23 of series 2. New sub- carinal lymph node measures 1 cm, image 30 of series 2. Lungs/Pleura: Moderate changes of centrilobular emphysema. Right perihilar lung mass measures 2.9 cm, image 37 of series 2. Previously 1.8 cm. New left perihilar lung mass measures 2.9 by 3.4 cm, image 91 of  series 6. New left lower lobe lung nodule measures 0.9 x 0.7 cm, image 117 of series 6. Musculoskeletal: Within the left breast there is a peripherally enhancing lesion measuring 1.4 cm, image 37 of series 2. New from previous exam. Also new is a left superior breast lesion measuring 1.3 cm, image 17 of series 2. CT ABDOMEN PELVIS FINDINGS Hepatobiliary: Scattered low-attenuation lesions are identified within the liver. New from previous exam. Within the posterior right lobe index lesion measures 0.8 cm, image 60 of series 2. Also in the right lobe of liver is a 0.7 cm lesion, image 62 of series 2. Gallbladder normal. No biliary dilatation. Pancreas: Pancreas normal. Spleen: No splenic metastases. Adrenals/Urinary Tract: Right  adrenal mass measures 4.5 x 2.0 cm, image 56 of series 2. Previously 4.8 x 2.5 cm. Left adrenal lesion measures 2.1 x 1.6 cm, image 55 of series 2. Previously 2.9 x 1.9 cm. Bilateral kidney cysts. No hydronephrosis. Urinary bladder appears normal. Stomach/Bowel: The stomach is unremarkable. No pathologic dilatation of the large or small bowel loops. Vascular/Lymphatic: Aortic atherosclerosis. No aneurysm. New retroperitoneal adenopathy. Index aortocaval node at the level of the left renal vein measures 1.7 cm, image 64 of series 2. New from previous exam. Also new is a left periaortic node measuring 1.9 cm, image 64 of series 2. Reproductive: Uterus and bilateral adnexa are unremarkable. Other: Enhancing soft tissue nodule along the left pelvic sidewall is new from previous exam measuring 1.8 cm, image 99 of series 2. Musculoskeletal: Multifocal abdominal wall metastatic implants identified. The largest is at the level of the umbilicus on the left measuring 3 cm, image 80 of series 2. Lytic bone lesion within the right iliac bone measures 1.6 cm, image 88 of series 2. New from previous exam. Mixed lytic and sclerotic bone metastasis within the left iliac bone measures 2.8 cm, image 93 of  series 2. Unchanged from previous exam. IMPRESSION: 1. Overall interval progression of disease. 2. New left supraclavicular, mediastinal, abdominal and pelvic nodal metastases. 3. New bilateral pulmonary metastasis 4. Interval development of multifocal body wall subcutaneous metastasis. 5. New lytic lesion identified within the right iliac bone. 6. New liver metastasis 7. Mild regression of bilateral adrenal gland metastasis. Electronically Signed   By: Kerby Moors M.D.   On: 02/23/2017 09:19     ASSESSMENT/PLAN:  Non-small cell carcinoma of right lung, stage 4 ([facility]) This is a very pleasant 46 year old African-American female with metastatic non-small cell lung cancer, adenocarcinoma with bone and brain metastasis. The patient underwent several treatments in the past including induction systemic chemotherapy with carboplatin and Alimta discontinued secondary to intolerance followed by maintenance treatment with single agent Alimta discontinued secondary to disease progression followed by treatment with immunotherapy with Tecentriq (Atezolizumab) for 6 cycles discontinued secondary to disease progression. The patient was then started on treatment with docetaxel and Cyramza discontinued secondary to disease progression. She also had palliative radiation to the brain for metastatic disease to the brain. She was treated with systemic chemotherapy with single agent gemcitabine status post 5 cycles discontinued secondary to disease progression. She had repeat CT scan of the chest, abdomen and pelvis performed in August 2018 which showed evidence for disease progression with enlargement of the bilateral adrenal lesions. The patient underwent radiation to the adrenal lesions.  The patient was seen with Dr. Julien Nordmann.  CT scan results were discussed with the patient.  Explained that she does have overall progression of disease. We previously outlined a referral to hospice for palliation of symptoms versus  single agent Navelbine. The patient has indicated she does not want to give up and wants to try the Navelbine. Again explained to the patient that the chemotherapy may not be effective but the patient still wants to proceed. Reviewed adverse effects of the treatment including, but not limited to, myelosuppression, and nausea, vomiting. The patient understands this and is in agreement to proceeding.   Refills for fentanyl patch and Percocet were given today.  A refill of Zofran was sent to her local pharmacy. For the weight loss, she will continue on Marinol. She was advised that she is to take this only twice a day and not increase to 4 times a day.  She will also continue on Remeron.   The patient will be seen back for follow-up for evaluation prior to cycle 2 of her chemotherapy.  She was advised to call if she has any other concerning symptoms in the interval. The patient voices understanding of current disease status and treatment options and is in agreement with the current care plan. All questions were answered. The patient knows to call the clinic with any problems, questions or concerns. We can certainly see the patient much sooner if necessary.   No orders of the defined types were placed in this encounter.   Mikey Bussing, DNP, AGPCNP-BC, AOCNP 03/02/17  ADDENDUM: Hematology/Oncology Attending: I had a face-to-face encounter with the patient.  I recommended her care plan.  This is a very pleasant 46 years old African-American female with a stage IV non-small cell lung cancer status post several chemotherapy regimens as well as immunotherapy and palliative radiation.  Unfortunately the patient has progressive disease and we had a lengthy discussion with her in the past regarding referral to palliative care and hospice but she declined this option.  She would like to try one more option of systemic chemotherapy.  She had repeat imaging studies including CT scan of the chest, abdomen  and pelvis that showed further evidence of disease progression. We would proceed with day 1 of cycle 1 of chemotherapy with Navelbine 25 mg/M2 every 2 weeks.  We will continue to monitor her closely during this treatment. For pain management she was given refill for fentanyl patch as well as Percocet.  We will also send refill of Zofran to her pharmacy. For the weight loss and lack of appetite, she will continue on Marinol and Remeron. The patient will come back for follow-up visit in 2 weeks for evaluation before the next cycle of her treatment. She was advised to call immediately if she has any concerning symptoms in the interval.  Disclaimer: This note was dictated with voice recognition software. Similar sounding words can inadvertently be transcribed and may be missed upon review. Andrea Kempf, MD 03/02/17

## 2017-03-01 NOTE — Patient Instructions (Signed)
[facility] Discharge Instructions for Patients Receiving Chemotherapy  Today you received the following chemotherapy agents vinorelbine  To help prevent nausea and vomiting after your treatment, we encourage you to take your nausea medication as prescribed.  If you develop nausea and vomiting that is not controlled by your nausea medication, call the clinic.   BELOW ARE SYMPTOMS THAT SHOULD BE REPORTED IMMEDIATELY:  *FEVER GREATER THAN 100.5 F  *CHILLS WITH OR WITHOUT FEVER  NAUSEA AND VOMITING THAT IS NOT CONTROLLED WITH YOUR NAUSEA MEDICATION  *UNUSUAL SHORTNESS OF BREATH  *UNUSUAL BRUISING OR BLEEDING  TENDERNESS IN MOUTH AND THROAT WITH OR WITHOUT PRESENCE OF ULCERS  *URINARY PROBLEMS  *BOWEL PROBLEMS  UNUSUAL RASH Items with * indicate a potential emergency and should be followed up as soon as possible.  Feel free to call the clinic should you have any questions or concerns. The clinic phone number is [phone].  Please show the [facility] at check-in to the Emergency Department and triage nurse.     Vinorelbine injection What is this medicine? VINORELBINE (vi NOR el been) is a chemotherapy drug. It targets fast dividing cells, like cancer cells, and causes these cells to die. This medicine is used to treat cancer, like lung cancer. This medicine may be used for other purposes; ask your health care provider or pharmacist if you have questions. COMMON BRAND NAME(S): Navelbine What should I tell my health care provider before I take this medicine? They need to know if you have any of these conditions: -blood disorders -infection (especially chickenpox and herpes) -liver disease -lung disease -nervous system disease -previous or current radiation therapy -an unusual or allergic reaction to vinorelbine, other chemotherapy agents, other medicines, foods, dyes, or preservatives -pregnant or trying to get pregnant -breast-feeding How  should I use this medicine? This drug is given as an infusion into a vein. It is administered in a hospital or clinic by a specially trained health care professional. If you have pain, swelling, burning or any unusual feeling around the site of your injection, tell your health care professional right away. Talk to your pediatrician regarding the use of this medicine in children. Special care may be needed. Overdosage: If you think you have taken too much of this medicine contact a poison control center or emergency room at once. NOTE: This medicine is only for you. Do not share this medicine with others. What if I miss a dose? It is important not to miss your dose. Call your doctor or health care professional if you are unable to keep an appointment. What may interact with this medicine? Do not take this medicine with any of the following medications: -itraconazole -voriconazole This medicine may also interact with the following medications: -cyclosporine -erythromycin -fluconazole -ketoconazole -medicines for HIV like delavirdine, efavirenz, nevirapine -medicines for seizures like ethotoin, fosphenotoin, phenytoin -medicines to increase blood counts like filgrastim, pegfilgrastim, sargramostim -other chemotherapy drugs like cisplatin, mitomycin, paclitaxel -vaccines Talk to your doctor or health care professional before taking any of these medicines: -acetaminophen -aspirin -ibuprofen -ketoprofen -naproxen This list may not describe all possible interactions. Give your health care provider a list of all the medicines, herbs, non-prescription drugs, or dietary supplements you use. Also tell them if you smoke, drink alcohol, or use illegal drugs. Some items may interact with your medicine. What should I watch for while using this medicine? Your condition will be monitored carefully while you are receiving this medicine. You will need important blood work done  while you are taking this  medicine. This drug may make you feel generally unwell. This is not uncommon, as chemotherapy can affect healthy cells as well as cancer cells. Report any side effects. Continue your course of treatment even though you feel ill unless your doctor tells you to stop. In some cases, you may be given additional medicines to help with side effects. Follow all directions for their use. Call your doctor or health care professional for advice if you get a fever, chills or sore throat, or other symptoms of a cold or flu. Do not treat yourself. This drug decreases your body's ability to fight infections. Try to avoid being around people who are sick. This medicine may increase your risk to bruise or bleed. Call your doctor or health care professional if you notice any unusual bleeding. Be careful brushing and flossing your teeth or using a toothpick because you may get an infection or bleed more easily. If you have any dental work done, tell your dentist you are receiving this medicine. Avoid taking products that contain aspirin, acetaminophen, ibuprofen, naproxen, or ketoprofen unless instructed by your doctor. These medicines may hide a fever. Do not become pregnant while taking this medicine. Women should inform their doctor if they wish to become pregnant or think they might be pregnant. There is a potential for serious side effects to an unborn child. Talk to your health care professional or pharmacist for more information. Do not breast-feed an infant while taking this medicine. Men must use a latex condom during sexual contact with a woman while taking this medicine and for 3 months after you stop taking this medicine. A latex condom is needed even if you have had a vasectomy. Contact your doctor right away if your partner becomes pregnant. Do not donate sperm while taking this medicine and for 3 months after you stop taking this medicine. Men should inform their doctors if they wish to father a child. This  medicine may lower sperm counts. What side effects may I notice from receiving this medicine? Side effects that you should report to your doctor or health care professional as soon as possible: -allergic reactions like skin rash, itching or hives, swelling of the face, lips, or tongue -low blood counts - This drug may decrease the number of white blood cells, red blood cells and platelets. You may be at increased risk for infections and bleeding. -signs of infection - fever or chills, cough, sore throat, pain or difficulty passing urine -signs of decreased platelets or bleeding - bruising, pinpoint red spots on the skin, black, tarry stools, nosebleeds -signs of decreased red blood cells - unusually weak or tired, fainting spells, lightheadedness -breathing problems -chest pain -constipation -cough -mouth sores -nausea and vomiting -pain, swelling, redness or irritation at the injection site -pain, tingling, numbness in the hands or feet -stomach pain -trouble passing urine or change in the amount of urine Side effects that usually do not require medical attention (report to your doctor or health care professional if they continue or are bothersome): -diarrhea -hair loss -jaw pain -loss of appetite This list may not describe all possible side effects. Call your doctor for medical advice about side effects. You may report side effects to FDA at 1-800-FDA-1088. Where should I keep my medicine? This drug is given in a hospital or clinic and will not be stored at home. NOTE: This sheet is a summary. It may not cover all possible information. If you have questions about this medicine,  talk to your doctor, pharmacist, or health care provider.  2018 Elsevier/Gold Standard (2013-10-08 11:37:59)

## 2017-03-01 NOTE — Progress Notes (Signed)
Per Altamese Dilling NP ok to tx today with labs and VS.

## 2017-03-02 ENCOUNTER — Encounter: Payer: Self-pay | Admitting: Oncology

## 2017-03-02 NOTE — Assessment & Plan Note (Signed)
This is a very pleasant 46 year old African-American female with metastatic non-small cell lung cancer, adenocarcinoma with bone and brain metastasis. The patient underwent several treatments in the past including induction systemic chemotherapy with carboplatin and Alimta discontinued secondary to intolerance followed by maintenance treatment with single agent Alimta discontinued secondary to disease progression followed by treatment with immunotherapy with Tecentriq (Atezolizumab) for 6 cycles discontinued secondary to disease progression. The patient was then started on treatment with docetaxel and Cyramza discontinued secondary to disease progression. She also had palliative radiation to the brain for metastatic disease to the brain. She was treated with systemic chemotherapy with single agent gemcitabine status post 5 cycles discontinued secondary to disease progression. She had repeat CT scan of the chest, abdomen and pelvis performed in August 2018 which showed evidence for disease progression with enlargement of the bilateral adrenal lesions. The patient underwent radiation to the adrenal lesions.  The patient was seen with Dr. Julien Nordmann.  CT scan results were discussed with the patient.  Explained that she does have overall progression of disease. We previously outlined a referral to hospice for palliation of symptoms versus single agent Navelbine. The patient has indicated she does not want to give up and wants to try the Navelbine. Again explained to the patient that the chemotherapy may not be effective but the patient still wants to proceed. Reviewed adverse effects of the treatment including, but not limited to, myelosuppression, and nausea, vomiting. The patient understands this and is in agreement to proceeding.   Refills for fentanyl patch and Percocet were given today.  A refill of Zofran was sent to her local pharmacy. For the weight loss, she will continue on Marinol. She was advised that  she is to take this only twice a day and not increase to 4 times a day. She will also continue on Remeron.   The patient will be seen back for follow-up for evaluation prior to cycle 2 of her chemotherapy.  She was advised to call if she has any other concerning symptoms in the interval. The patient voices understanding of current disease status and treatment options and is in agreement with the current care plan. All questions were answered. The patient knows to call the clinic with any problems, questions or concerns. We can certainly see the patient much sooner if necessary.

## 2017-03-07 ENCOUNTER — Telehealth: Payer: Self-pay

## 2017-03-07 NOTE — Telephone Encounter (Signed)
Inez Catalina RN with palliative care of [facility] called. The last time they were at pt's home was in February. They have been leaving voice messages with no call back from the pt. Does Dr Julien Nordmann want to continue with palliative?   Betty's call back number [phone]

## 2017-03-09 ENCOUNTER — Telehealth: Payer: Self-pay | Admitting: Medical Oncology

## 2017-03-09 MED FILL — fentaNYL 100 MCG/HR PT72: 100 | 30 days supply | Qty: 10 | Fill #0

## 2017-03-09 MED FILL — OXYCODONE-ACETAMINOPHEN 10-: 10-325 | 15 days supply | Qty: 60 | Fill #0

## 2017-03-09 NOTE — Telephone Encounter (Signed)
Asking if she can visit Elmyra Ricks with cold symptoms. I told her okay wear a mask.

## 2017-03-09 NOTE — Telephone Encounter (Signed)
Left Andrea [name]'s message for Andrea [name].

## 2017-03-09 NOTE — Telephone Encounter (Signed)
-----   Message from Curt Bears, MD sent at 03/07/2017  3:51 PM EST ----- Regarding: RE: question She is currently on active treatment. They can resume palliative once she her treatment is done. I like her to be on palliative but she insisted on treatment. ----- Message ----- From: Ardeen Garland, RN Sent: 03/07/2017   3:21 PM To: Curt Bears, MD Subject: question                                       Inez Catalina RN with [facility] called. The last time they were at pt's home was in February. They have been leaving voice messages with no call back from the pt. Does Dr Julien Nordmann want to continue with palliative?   (Pt comes in for cycle 2 next wed.)

## 2017-03-13 ENCOUNTER — Telehealth: Payer: Self-pay | Admitting: Oncology

## 2017-03-13 NOTE — Telephone Encounter (Signed)
Left message for patient regarding upcoming appointments per 11/9 sch message.

## 2017-03-14 ENCOUNTER — Ambulatory Visit (HOSPITAL_BASED_OUTPATIENT_CLINIC_OR_DEPARTMENT_OTHER): Payer: Medicaid Other | Admitting: Oncology

## 2017-03-14 ENCOUNTER — Other Ambulatory Visit: Payer: Self-pay | Admitting: Medical Oncology

## 2017-03-14 ENCOUNTER — Other Ambulatory Visit: Payer: Self-pay | Admitting: Oncology

## 2017-03-14 ENCOUNTER — Other Ambulatory Visit (HOSPITAL_BASED_OUTPATIENT_CLINIC_OR_DEPARTMENT_OTHER): Payer: Medicaid Other

## 2017-03-14 ENCOUNTER — Ambulatory Visit (HOSPITAL_BASED_OUTPATIENT_CLINIC_OR_DEPARTMENT_OTHER): Payer: Medicaid Other

## 2017-03-14 ENCOUNTER — Encounter: Payer: Self-pay | Admitting: Oncology

## 2017-03-14 VITALS — BP 113/79 | HR 108 | Temp 97.6°F | Resp 18

## 2017-03-14 DIAGNOSIS — C3491 Malignant neoplasm of unspecified part of right bronchus or lung: Secondary | ICD-10-CM

## 2017-03-14 DIAGNOSIS — R8290 Unspecified abnormal findings in urine: Secondary | ICD-10-CM

## 2017-03-14 DIAGNOSIS — E876 Hypokalemia: Secondary | ICD-10-CM

## 2017-03-14 DIAGNOSIS — Z7189 Other specified counseling: Secondary | ICD-10-CM

## 2017-03-14 DIAGNOSIS — Z5111 Encounter for antineoplastic chemotherapy: Secondary | ICD-10-CM | POA: Diagnosis present

## 2017-03-14 DIAGNOSIS — R300 Dysuria: Secondary | ICD-10-CM

## 2017-03-14 DIAGNOSIS — C7951 Secondary malignant neoplasm of bone: Secondary | ICD-10-CM | POA: Diagnosis not present

## 2017-03-14 DIAGNOSIS — C7931 Secondary malignant neoplasm of brain: Secondary | ICD-10-CM

## 2017-03-14 DIAGNOSIS — Z95828 Presence of other vascular implants and grafts: Secondary | ICD-10-CM

## 2017-03-14 LAB — COMPREHENSIVE METABOLIC PANEL
ALT: <6 U/L (ref 0–55)
AST: 13 U/L (ref 5–34)
Albumin: 2.5 g/dL — ABNORMAL LOW (ref 3.5–5.0)
Alkaline Phosphatase: 99 U/L (ref 40–150)
Anion Gap: 12 mEq/L — ABNORMAL HIGH (ref 3–11)
BUN: 5.7 mg/dL — ABNORMAL LOW (ref 7.0–26.0)
CHLORIDE: 92 meq/L — AB (ref 98–109)
CO2: 30 mEq/L — ABNORMAL HIGH (ref 22–29)
Calcium: 11.2 mg/dL — ABNORMAL HIGH (ref 8.4–10.4)
Creatinine: 0.7 mg/dL (ref 0.6–1.1)
Glucose: 118 mg/dl (ref 70–140)
POTASSIUM: 2.7 meq/L — AB (ref 3.5–5.1)
Sodium: 134 mEq/L — ABNORMAL LOW (ref 136–145)
Total Bilirubin: 0.61 mg/dL (ref 0.20–1.20)
Total Protein: 6.9 g/dL (ref 6.4–8.3)

## 2017-03-14 LAB — CBC WITH DIFFERENTIAL/PLATELET
BASO%: 0.3 % (ref 0.0–2.0)
BASOS ABS: 0 10*3/uL (ref 0.0–0.1)
EOS ABS: 0 10*3/uL (ref 0.0–0.5)
EOS%: 0.3 % (ref 0.0–7.0)
HEMATOCRIT: 26.8 % — AB (ref 34.8–46.6)
HEMOGLOBIN: 8.6 g/dL — AB (ref 11.6–15.9)
LYMPH#: 0.5 10*3/uL — AB (ref 0.9–3.3)
LYMPH%: 13.7 % — ABNORMAL LOW (ref 14.0–49.7)
MCH: 30.1 pg (ref 25.1–34.0)
MCHC: 32.1 g/dL (ref 31.5–36.0)
MCV: 93.7 fL (ref 79.5–101.0)
MONO#: 0.7 10*3/uL (ref 0.1–0.9)
MONO%: 19 % — ABNORMAL HIGH (ref 0.0–14.0)
NEUT#: 2.3 10*3/uL (ref 1.5–6.5)
NEUT%: 66.7 % (ref 38.4–76.8)
Platelets: 353 10*3/uL (ref 145–400)
RBC: 2.86 10*6/uL — ABNORMAL LOW (ref 3.70–5.45)
RDW: 14.7 % — AB (ref 11.2–14.5)
WBC: 3.4 10*3/uL — ABNORMAL LOW (ref 3.9–10.3)

## 2017-03-14 MED ORDER — HEPARIN SOD (PORK) LOCK FLUSH 100 UNIT/ML IV SOLN
500.0000 [IU] | Freq: Once | INTRAVENOUS | Status: AC | PRN
  Administered 2017-03-14: 500 [IU]
  Filled 2017-03-14: qty 5

## 2017-03-14 MED ORDER — POTASSIUM CHLORIDE CRYS ER 20 MEQ PO TBCR
20.0000 meq | EXTENDED_RELEASE_TABLET | Freq: Two times a day (BID) | ORAL | 0 refills | Status: AC
Start: 2017-03-14 — End: ?

## 2017-03-14 MED ORDER — OXYCODONE-ACETAMINOPHEN 5-325 MG PO TABS
2.0000 | ORAL_TABLET | ORAL | Status: AC
  Administered 2017-03-14: 2 via ORAL

## 2017-03-14 MED ORDER — PROCHLORPERAZINE MALEATE 10 MG PO TABS
ORAL_TABLET | ORAL | Status: AC
  Filled 2017-03-14: qty 1

## 2017-03-14 MED ORDER — DENOSUMAB 120 MG/1.7ML ~~LOC~~ SOLN
120.0000 mg | Freq: Once | SUBCUTANEOUS | Status: AC
  Administered 2017-03-14: 120 mg via SUBCUTANEOUS
  Filled 2017-03-14: qty 1.7

## 2017-03-14 MED ORDER — OXYCODONE-ACETAMINOPHEN 5-325 MG PO TABS
ORAL_TABLET | ORAL | Status: AC
  Filled 2017-03-14: qty 2

## 2017-03-14 MED ORDER — SODIUM CHLORIDE 0.9% FLUSH
10.0000 mL | INTRAVENOUS | Status: DC | PRN
Start: 2017-03-14 — End: 2017-03-14
  Administered 2017-03-14: 10 mL
  Filled 2017-03-14: qty 10

## 2017-03-14 MED ORDER — PROCHLORPERAZINE MALEATE 10 MG PO TABS
10.0000 mg | ORAL_TABLET | Freq: Once | ORAL | Status: AC
  Administered 2017-03-14: 10 mg via ORAL

## 2017-03-14 MED ORDER — VINORELBINE TARTRATE CHEMO INJECTION 50 MG/5ML
25.0000 mg/m2 | Freq: Once | INTRAVENOUS | Status: AC
  Administered 2017-03-14: 36 mg via INTRAVENOUS
  Filled 2017-03-14: qty 3.6

## 2017-03-14 MED ORDER — SODIUM CHLORIDE 0.9 % IV SOLN
Freq: Once | INTRAVENOUS | Status: AC
  Administered 2017-03-14: 10:00:00 via INTRAVENOUS

## 2017-03-14 MED ORDER — POTASSIUM CHLORIDE CRYS ER 20 MEQ PO TBCR
40.0000 meq | EXTENDED_RELEASE_TABLET | Freq: Once | ORAL | Status: AC
  Administered 2017-03-14: 40 meq via ORAL
  Filled 2017-03-14: qty 2

## 2017-03-14 MED FILL — POTASSIUM CL ER 20 MEQ TABL: 20 | 7 days supply | Qty: 14 | Fill #0

## 2017-03-14 NOTE — Assessment & Plan Note (Addendum)
This is a very pleasant 46 year old African-American female with metastatic non-small cell lung cancer, adenocarcinoma with bone and brain metastasis. The patient underwent several treatments in the past including induction systemic chemotherapy with carboplatin and Alimta discontinued secondary to intolerance followed by maintenance treatment with single agent Alimta discontinued secondary to disease progression followed by treatment with immunotherapy with Tecentriq (Atezolizumab) for 6 cycles discontinued secondary to disease progression. The patient was then started on treatment with docetaxel and Cyramza discontinued secondary to disease progression. She also had palliative radiation to the brain for metastatic disease to the brain. She was treated with systemic chemotherapy with single agent gemcitabine status post 5 cycles discontinued secondary to disease progression. She had repeat CT scan of the chest, abdomen and pelvis performedin August 2018 which showed evidence for disease progression with enlargement of the bilateral adrenal lesions.The patient underwent radiation to the adrenal lesions.  CBC was reviewed and is adequate for treatment.  The patient wishes to continue her chemotherapy.  She will proceed with cycle 2 today as scheduled.  We will check a urinalysis due to the change in color of her urine. Continue fentanyl patch and Percocet for pain control.   Continue Zofran as needed for nausea.  She may use Imodium as needed for diarrhea.  For the weight loss, she will continue on Marinol.  She will also continue on Remeron.   The patient will be seen back for follow-up for evaluation prior to cycle 3 of her chemotherapy.  She was advised to call if she has any other concerning symptoms in the interval. The patient voices understanding of current disease status and treatment options and is in agreement with the current care plan. All questions were answered. The patient  knows to call the clinic with any problems, questions or concerns. We can certainly see the patient much sooner if necessary.

## 2017-03-14 NOTE — Assessment & Plan Note (Signed)
The patient has known bone metastases.  She has not received Xgeva in several months.  Calcium level is elevated today.  Delton See was ordered to be given today.

## 2017-03-14 NOTE — Patient Instructions (Signed)
[facility] Discharge Instructions for Patients Receiving Chemotherapy  Today you received the following chemotherapy agents: Vinorelbine  To help prevent nausea and vomiting after your treatment, we encourage you to take your nausea medication as prescribed.  If you develop nausea and vomiting that is not controlled by your nausea medication, call the clinic.   BELOW ARE SYMPTOMS THAT SHOULD BE REPORTED IMMEDIATELY:  *FEVER GREATER THAN 100.5 F  *CHILLS WITH OR WITHOUT FEVER  NAUSEA AND VOMITING THAT IS NOT CONTROLLED WITH YOUR NAUSEA MEDICATION  *UNUSUAL SHORTNESS OF BREATH  *UNUSUAL BRUISING OR BLEEDING  TENDERNESS IN MOUTH AND THROAT WITH OR WITHOUT PRESENCE OF ULCERS  *URINARY PROBLEMS  *BOWEL PROBLEMS  UNUSUAL RASH Items with * indicate a potential emergency and should be followed up as soon as possible.  Feel free to call the clinic should you have any questions or concerns. The clinic phone number is [phone].  Please show the [facility] at check-in to the Emergency Department and triage nurse.  Denosumab injection What is this medicine? DENOSUMAB (den oh sue mab) slows bone breakdown. Prolia is used to treat osteoporosis in women after menopause and in men. Delton See is used to treat a high calcium level due to cancer and to prevent bone fractures and other bone problems caused by multiple myeloma or cancer bone metastases. Delton See is also used to treat giant cell tumor of the bone. This medicine may be used for other purposes; ask your health care provider or pharmacist if you have questions. COMMON BRAND NAME(S): Prolia, XGEVA What should I tell my health care provider before I take this medicine? They need to know if you have any of these conditions: -dental disease -having surgery or tooth extraction -infection -kidney disease -low levels of calcium or Vitamin D in the blood -malnutrition -on hemodialysis -skin conditions or  sensitivity -thyroid or parathyroid disease -an unusual reaction to denosumab, other medicines, foods, dyes, or preservatives -pregnant or trying to get pregnant -breast-feeding How should I use this medicine? This medicine is for injection under the skin. It is given by a health care professional in a hospital or clinic setting. If you are getting Prolia, a special MedGuide will be given to you by the pharmacist with each prescription and refill. Be sure to read this information carefully each time. For Prolia, talk to your pediatrician regarding the use of this medicine in children. Special care may be needed. For Delton See, talk to your pediatrician regarding the use of this medicine in children. While this drug may be prescribed for children as young as 13 years for selected conditions, precautions do apply. Overdosage: If you think you have taken too much of this medicine contact a poison control center or emergency room at once. NOTE: This medicine is only for you. Do not share this medicine with others. What if I miss a dose? It is important not to miss your dose. Call your doctor or health care professional if you are unable to keep an appointment. What may interact with this medicine? Do not take this medicine with any of the following medications: -other medicines containing denosumab This medicine may also interact with the following medications: -medicines that lower your chance of fighting infection -steroid medicines like prednisone or cortisone This list may not describe all possible interactions. Give your health care provider a list of all the medicines, herbs, non-prescription drugs, or dietary supplements you use. Also tell them if you smoke, drink alcohol, or use illegal drugs.  Some items may interact with your medicine. What should I watch for while using this medicine? Visit your doctor or health care professional for regular checks on your progress. Your doctor or health care  professional may order blood tests and other tests to see how you are doing. Call your doctor or health care professional for advice if you get a fever, chills or sore throat, or other symptoms of a cold or flu. Do not treat yourself. This drug may decrease your body's ability to fight infection. Try to avoid being around people who are sick. You should make sure you get enough calcium and vitamin D while you are taking this medicine, unless your doctor tells you not to. Discuss the foods you eat and the vitamins you take with your health care professional. See your dentist regularly. Brush and floss your teeth as directed. Before you have any dental work done, tell your dentist you are receiving this medicine. Do not become pregnant while taking this medicine or for 5 months after stopping it. Talk with your doctor or health care professional about your birth control options while taking this medicine. Women should inform their doctor if they wish to become pregnant or think they might be pregnant. There is a potential for serious side effects to an unborn child. Talk to your health care professional or pharmacist for more information. What side effects may I notice from receiving this medicine? Side effects that you should report to your doctor or health care professional as soon as possible: -allergic reactions like skin rash, itching or hives, swelling of the face, lips, or tongue -bone pain -breathing problems -dizziness -jaw pain, especially after dental work -redness, blistering, peeling of the skin -signs and symptoms of infection like fever or chills; cough; sore throat; pain or trouble passing urine -signs of low calcium like fast heartbeat, muscle cramps or muscle pain; pain, tingling, numbness in the hands or feet; seizures -unusual bleeding or bruising -unusually weak or tired Side effects that usually do not require medical attention (report to your doctor or health care professional  if they continue or are bothersome): -constipation -diarrhea -headache -joint pain -loss of appetite -muscle pain -runny nose -tiredness -upset stomach This list may not describe all possible side effects. Call your doctor for medical advice about side effects. You may report side effects to FDA at 1-800-FDA-1088. Where should I keep my medicine? This medicine is only given in a clinic, doctor's office, or other health care setting and will not be stored at home. NOTE: This sheet is a summary. It may not cover all possible information. If you have questions about this medicine, talk to your doctor, pharmacist, or health care provider.  2018 Elsevier/Gold Standard (2016-05-02 19:17:21)   Hypokalemia Hypokalemia means that the amount of potassium in the blood is lower than normal.Potassium is a chemical that helps regulate the amount of fluid in the body (electrolyte). It also stimulates muscle tightening (contraction) and helps nerves work properly.Normally, most of the body's potassium is inside of cells, and only a very small amount is in the blood. Because the amount in the blood is so small, minor changes to potassium levels in the blood can be life-threatening. What are the causes? This condition may be caused by:  Antibiotic medicine.  Diarrhea or vomiting. Taking too much of a medicine that helps you have a bowel movement (laxative) can cause diarrhea and lead to hypokalemia.  Chronic kidney disease (CKD).  Medicines that help the body get  rid of excess fluid (diuretics).  Eating disorders, such as bulimia.  Low magnesium levels in the body.  Sweating a lot.  What are the signs or symptoms? Symptoms of this condition include:  Weakness.  Constipation.  Fatigue.  Muscle cramps.  Mental confusion.  Skipped heartbeats or irregular heartbeat (palpitations).  Tingling or numbness.  How is this diagnosed? This condition is diagnosed with a blood test. How is  this treated? Hypokalemia can be treated by taking potassium supplements by mouth or adjusting the medicines that you take. Treatment may also include eating more foods that contain a lot of potassium. If your potassium level is very low, you may need to get potassium through an IV tube in one of your veins and be monitored in the hospital. Follow these instructions at home:  Take over-the-counter and prescription medicines only as told by your health care provider. This includes vitamins and supplements.  Eat a healthy diet. A healthy diet includes fresh fruits and vegetables, whole grains, healthy fats, and lean proteins.  If instructed, eat more foods that contain a lot of potassium, such as: ? Nuts, such as peanuts and pistachios. ? Seeds, such as sunflower seeds and pumpkin seeds. ? Peas, lentils, and lima beans. ? Whole grain and bran cereals and breads. ? Fresh fruits and vegetables, such as apricots, avocado, bananas, cantaloupe, kiwi, oranges, tomatoes, asparagus, and potatoes. ? Orange juice. ? Tomato juice. ? Red meats. ? Yogurt.  Keep all follow-up visits as told by your health care provider. This is important. Contact a health care provider if:  You have weakness that gets worse.  You feel your heart pounding or racing.  You vomit.  You have diarrhea.  You have diabetes (diabetes mellitus) and you have trouble keeping your blood sugar (glucose) in your target range. Get help right away if:  You have chest pain.  You have shortness of breath.  You have vomiting or diarrhea that lasts for more than 2 days.  You faint. This information is not intended to replace advice given to you by your health care provider. Make sure you discuss any questions you have with your health care provider. Document Released: 04/10/2005 Document Revised: 11/27/2015 Document Reviewed: 11/27/2015 Elsevier Interactive Patient Education  2018 [employer].

## 2017-03-14 NOTE — Progress Notes (Signed)
Per MD ok to treat with today's lab values. Oral potassium ordered and prescription for potassium sent to patient personal pharmacy per Shauna Hugh, desk RN.   Wylene Simmer, BSN, RN 03/14/2017 9:49 AM

## 2017-03-14 NOTE — Progress Notes (Signed)
[facility] OFFICE PROGRESS NOTE  [name], MD [place] [address]  DIAGNOSIS: Stage IV (T1a, N2, M1b) non-small cell lung cancer, adenocarcinoma with negative EGFR mutation and presented with a right hilar mass, subcarinal lymphadenopathy as well as a large soft tissue mass in the posterior iliac bone and metastatic bone lesions diagnosed in August 2016.  PRIOR THERAPY: 1) Status post palliative radiotherapy to the soft tissue mass in the left iliac bone under the care of Dr. Valere Dross. 2) Systemic chemotherapy with carboplatin for AUC of 5 and Alimta 500 MG/M2 every 3 weeks. First dose expected on 02/22/2015. Status post 6 cycles. Starting from cycle #3 Alimta was reduced to 400 MG/M2 secondary to neutropenia after the first 2 cycles. 3) Maintenance systemic chemotherapy with single agent Alimta 500 MG/M2 every 3 weeks. First dose 07/19/2015. Status post 3 cycles. Last dose was given 08/30/2015 discontinued secondary to disease progression. 4) Immunotherapy treatment with Tecentriq (Atezolizumab) 1200 MG IV every 3 weeks, for his dose 10/14/2015. Status post 6 cycles, discontinued secondary to disease progression. 5) whole brain irradiation for multiple new brain metastasis expected to be completed on 02/12/2016. 6) palliative radiotherapy to the progressive bone lesion in the left hip. 7) Systemic chemotherapy with docetaxel 75 MG/M2 and Cyramza 10 MG/KG every 3 weeks status post 5 cycles. Discontinued today secondary to disease progression. 8) Systemic chemotherapy with gemcitabine 1000 MG/M2 on days 1 and 8 every 3 weeks. First dose 07/12/2016. Status post 5 cycles. Discontinued today secondary to disease progression. 9) palliative radiotherapy to the bilateral adrenal glands under the care of Dr. Tammi Klippel.  CURRENT THERAPY: 1) Navelbine being 25 mg/m given every 2 weeks. Treatment will start on 03/01/2017. 2) Xgeva 120 mg subcutaneously on monthly basis  for metastatic bone disease  INTERVAL HISTORY: Andrea [name] 46 y.o. female returns for routine follow-up visit with her mother.  She is seen in the infusion area.  She was brought to the clinic on EMS stretcher.  The patient is sleepy today but is able to answer my questions.  She has ongoing issues with fatigue and pain.  She reports that she tolerate her last cycle of chemotherapy well overall with the exception of mild diarrhea.  Denies fevers and chills.  Denies chest pain, shortness breath, cough, hemoptysis.  Denies nausea, vomiting, and constipation.  The patient's mother reports that the patient does seem more sleepy and confused at times.  She has also noticed that her urine looks orange in color.  The patient is here for evaluation prior to cycle 2 of her chemotherapy.  MEDICAL HISTORY: Past Medical History:  Diagnosis Date  . Adrenal mass, left ([facility]) 09/21/2015  . Anginal pain ([facility])    chest pain not sure what related to  . Anxiety   . Bone cancer ([facility])    lung ca with bone mets  . Chronic back pain   . Depression   . Dysmenorrhea 12/06/2009   Qualifier: Diagnosis of  By: [name] RN, Regino Schultze    . Encounter for antineoplastic immunotherapy 10/07/2015  . Family history of adverse reaction to anesthesia    mother stopped breathing after surgery  . Goals of care, counseling/discussion 05/03/2016  . Headache    stress  . Low back pain radiating to left leg 11/29/2010  . Non-small cell carcinoma of right lung, stage 4 ([facility]) 01/01/2015  . S/P radiation therapy 01/01/2015 through 01/21/2015     Left iliac bone 3500 cGy in 14 sessions   .  Seizure ([facility])   . Shortness of breath dyspnea    with exertion, Pt denies 02/2015    ALLERGIES:  is allergic to morphine and related and hydrocodone.  MEDICATIONS:  Current Outpatient Medications  Medication Sig Dispense Refill  . diclofenac sodium (VOLTAREN) 1 % GEL Apply 4 g  topically 4 (four) times daily. 200 g 1  . dronabinol (MARINOL) 2.5 MG capsule TAKE 1 CAPSULE BY MOUTH TWICE DAILY BEFORE A MEAL  0  . fentaNYL (DURAGESIC - DOSED MCG/HR) 100 MCG/HR Place 1 patch (100 mcg total) every 3 (three) days onto the skin. 10 patch 0  . levETIRAcetam (KEPPRA) 1000 MG tablet Take 1 tablet (1,000 mg total) by mouth 2 (two) times daily. 180 tablet 4  . levETIRAcetam (KEPPRA) 1000 MG tablet Take 1 tablet (1,000 mg total) by mouth 2 (two) times daily. 180 tablet 4  . lidocaine (LIDODERM) 5 % Place 1 patch onto the skin daily. Remove & Discard patch within 12 hours or as directed by MD 30 patch 0  . lidocaine-prilocaine (EMLA) cream Apply a small amount to skin over port site 1 to 2 hours before use cover to keep in contact with skin 30 g PRN  . loratadine (CLARITIN) 10 MG tablet Take 1 tablet (10 mg total) by mouth daily.    . methylPREDNISolone (MEDROL DOSEPAK) 4 MG TBPK tablet Use as instructed 21 tablet 0  . mirtazapine (REMERON) 30 MG tablet Take 1 tablet (30 mg total) by mouth at bedtime. 30 tablet 0  . Multiple Vitamins-Minerals (MULTIVITAMIN WITH MINERALS) tablet Take by mouth.    . OLANZapine (ZYPREXA) 10 MG tablet Take 1 tablet (10 mg total) by mouth at bedtime. (Patient not taking: Reported on 01/12/2017) 30 tablet 0  . ondansetron (ZOFRAN) 4 MG tablet Take 1 tablet (4 mg total) every 8 (eight) hours as needed by mouth for nausea or vomiting. 20 tablet 1  . oxyCODONE-acetaminophen (PERCOCET) 10-325 MG tablet Take 1 tablet every 6 (six) hours as needed by mouth for pain. 60 tablet 0  . potassium chloride SA (K-DUR,KLOR-CON) 20 MEQ tablet Take 1 tablet (20 mEq total) by mouth 2 (two) times daily. 14 tablet 0  . potassium chloride SA (KLOR-CON M20) 20 MEQ tablet Take 1 tablet (20 mEq total) by mouth 2 (two) times daily. 60 tablet 0  . senna-docusate (SENOKOT-S) 8.6-50 MG tablet Take 2 tablets by mouth at bedtime. 30 tablet 0   No current facility-administered medications  for this visit.    Facility-Administered Medications Ordered in Other Visits  Medication Dose Route Frequency Provider Last Rate Last Dose  . sodium chloride flush (NS) 0.9 % injection 10 mL  10 mL Intracatheter PRN Curt Bears, MD   10 mL at 03/14/17 1115    SURGICAL HISTORY:  Past Surgical History:  Procedure Laterality Date  . IR RADIOLOGIST EVAL & MGMT  10/31/2016  . laproscopy surgery to check her tubes    . LUMBAR LAMINECTOMY/DECOMPRESSION MICRODISCECTOMY Left 05/01/2014   Procedure: Microdiscectomy - L5-S1 - left;  Surgeon: Eustace Moore, MD;  Location: [facility];  Service: Neurosurgery;  Laterality: Left;    REVIEW OF SYSTEMS:   Review of Systems  Constitutional: Negative for appetite change, chills, fatigue, fever and unexpected weight change.  HENT:   Negative for mouth sores, nosebleeds, sore throat and trouble swallowing.   Eyes: Negative for eye problems and icterus.  Respiratory: Negative for cough, hemoptysis, shortness of breath and wheezing.   Cardiovascular: Negative for chest pain and  leg swelling.  Gastrointestinal: Negative for abdominal pain, constipation, nausea and vomiting. Positive for intermittent diarrhea. Genitourinary: Negative for bladder incontinence, difficulty urinating, dysuria, frequency and hematuria.  Positive for an orange color noted with her urine.  Musculoskeletal: Negative for back pain, gait problem, neck pain and neck stiffness.  Skin: Negative for itching and rash.  Neurological: Negative for dizziness, extremity weakness, gait problem, headaches, light-headedness and seizures.  Hematological: Negative for adenopathy. Does not bruise/bleed easily.  Psychiatric/Behavioral: Negative for confusion, depression and sleep disturbance. The patient is not nervous/anxious.     PHYSICAL EXAMINATION:  Temperature 97.6, pulse 108, blood pressure 113/79, respirations 18, O2 sat 100% on room air  ECOG PERFORMANCE STATUS: 2 - Symptomatic, <50% confined  to bed  Physical Exam  Constitutional: Oriented to person, place, and time.  Thin, frail female.  No distress.  HENT:  Head: Normocephalic and atraumatic.  Mouth/Throat: Oropharynx is clear and moist. No oropharyngeal exudate.  Eyes: Conjunctivae are normal. Right eye exhibits no discharge. Left eye exhibits no discharge. No scleral icterus.  Neck: Normal range of motion. Neck supple.  Cardiovascular: Normal rate, regular rhythm, normal heart sounds and intact distal pulses.   Pulmonary/Chest: Effort normal and breath sounds normal. No respiratory distress. No wheezes. No rales.  Abdominal: Soft. Bowel sounds are normal. Exhibits no distension and no mass. There is no tenderness.  Musculoskeletal: Normal range of motion. Exhibits no edema.  Lymphadenopathy:    No cervical adenopathy.  Neurological: Alert and oriented to person, place, and time. Exhibits normal muscle tone. Coordination normal.  Skin: Skin is warm and dry. No rash noted. Not diaphoretic. No erythema. No pallor.  Psychiatric: Mood, memory and judgment normal.  Vitals reviewed.  LABORATORY DATA: Lab Results  Component Value Date   WBC 3.4 (L) 03/14/2017   HGB 8.6 (L) 03/14/2017   HCT 26.8 (L) 03/14/2017   MCV 93.7 03/14/2017   PLT 353 03/14/2017      Chemistry      Component Value Date/Time   NA 134 (L) 03/14/2017 0829   K 2.7 (LL) 03/14/2017 0829   CL 108 (H) 05/22/2016 1546   CO2 30 (H) 03/14/2017 0829   BUN 5.7 (L) 03/14/2017 0829   CREATININE 0.7 03/14/2017 0829      Component Value Date/Time   CALCIUM 11.2 (H) 03/14/2017 0829   ALKPHOS 99 03/14/2017 0829   AST 13 03/14/2017 0829   ALT <6 03/14/2017 0829   BILITOT 0.61 03/14/2017 0829       RADIOGRAPHIC STUDIES:  Ct Chest W Contrast  Result Date: 02/23/2017 CLINICAL DATA:  Metastatic lung cancer. EXAM: CT CHEST, ABDOMEN, AND PELVIS WITH CONTRAST TECHNIQUE: Multidetector CT imaging of the chest, abdomen and pelvis was performed following the  standard protocol during bolus administration of intravenous contrast. CONTRAST:  10m ISOVUE-300 IOPAMIDOL (ISOVUE-300) INJECTION 61% COMPARISON:  CT 10/09/2016 FINDINGS: CT CHEST FINDINGS Cardiovascular: The heart size appears normal. No pericardial effusion. Mediastinum/Nodes: The trachea remains patent. Normal appearance of the esophagus. New large left supraclavicular nodal mass measures 4.7 x 3.3 cm, image 4 of series 2. Adjacent left supraclavicular mass measures 3.0 by 2.4 cm, image number 3 of series 2. New mediastinal adenopathy. Right paratracheal lymph node measures 2.1 cm, image 23 of series 2. Left paratracheal nodal mass measures 1.6 cm, image 23 of series 2. New sub- carinal lymph node measures 1 cm, image 30 of series 2. Lungs/Pleura: Moderate changes of centrilobular emphysema. Right perihilar lung mass measures 2.9 cm, image 37 of  series 2. Previously 1.8 cm. New left perihilar lung mass measures 2.9 by 3.4 cm, image 91 of series 6. New left lower lobe lung nodule measures 0.9 x 0.7 cm, image 117 of series 6. Musculoskeletal: Within the left breast there is a peripherally enhancing lesion measuring 1.4 cm, image 37 of series 2. New from previous exam. Also new is a left superior breast lesion measuring 1.3 cm, image 17 of series 2. CT ABDOMEN PELVIS FINDINGS Hepatobiliary: Scattered low-attenuation lesions are identified within the liver. New from previous exam. Within the posterior right lobe index lesion measures 0.8 cm, image 60 of series 2. Also in the right lobe of liver is a 0.7 cm lesion, image 62 of series 2. Gallbladder normal. No biliary dilatation. Pancreas: Pancreas normal. Spleen: No splenic metastases. Adrenals/Urinary Tract: Right adrenal mass measures 4.5 x 2.0 cm, image 56 of series 2. Previously 4.8 x 2.5 cm. Left adrenal lesion measures 2.1 x 1.6 cm, image 55 of series 2. Previously 2.9 x 1.9 cm. Bilateral kidney cysts. No hydronephrosis. Urinary bladder appears normal.  Stomach/Bowel: The stomach is unremarkable. No pathologic dilatation of the large or small bowel loops. Vascular/Lymphatic: Aortic atherosclerosis. No aneurysm. New retroperitoneal adenopathy. Index aortocaval node at the level of the left renal vein measures 1.7 cm, image 64 of series 2. New from previous exam. Also new is a left periaortic node measuring 1.9 cm, image 64 of series 2. Reproductive: Uterus and bilateral adnexa are unremarkable. Other: Enhancing soft tissue nodule along the left pelvic sidewall is new from previous exam measuring 1.8 cm, image 99 of series 2. Musculoskeletal: Multifocal abdominal wall metastatic implants identified. The largest is at the level of the umbilicus on the left measuring 3 cm, image 80 of series 2. Lytic bone lesion within the right iliac bone measures 1.6 cm, image 88 of series 2. New from previous exam. Mixed lytic and sclerotic bone metastasis within the left iliac bone measures 2.8 cm, image 93 of series 2. Unchanged from previous exam. IMPRESSION: 1. Overall interval progression of disease. 2. New left supraclavicular, mediastinal, abdominal and pelvic nodal metastases. 3. New bilateral pulmonary metastasis 4. Interval development of multifocal body wall subcutaneous metastasis. 5. New lytic lesion identified within the right iliac bone. 6. New liver metastasis 7. Mild regression of bilateral adrenal gland metastasis. Electronically Signed   By: Kerby Moors M.D.   On: 02/23/2017 09:19   Ct Abdomen Pelvis W Contrast  Result Date: 02/23/2017 CLINICAL DATA:  Metastatic lung cancer. EXAM: CT CHEST, ABDOMEN, AND PELVIS WITH CONTRAST TECHNIQUE: Multidetector CT imaging of the chest, abdomen and pelvis was performed following the standard protocol during bolus administration of intravenous contrast. CONTRAST:  66m ISOVUE-300 IOPAMIDOL (ISOVUE-300) INJECTION 61% COMPARISON:  CT 10/09/2016 FINDINGS: CT CHEST FINDINGS Cardiovascular: The heart size appears normal. No  pericardial effusion. Mediastinum/Nodes: The trachea remains patent. Normal appearance of the esophagus. New large left supraclavicular nodal mass measures 4.7 x 3.3 cm, image 4 of series 2. Adjacent left supraclavicular mass measures 3.0 by 2.4 cm, image number 3 of series 2. New mediastinal adenopathy. Right paratracheal lymph node measures 2.1 cm, image 23 of series 2. Left paratracheal nodal mass measures 1.6 cm, image 23 of series 2. New sub- carinal lymph node measures 1 cm, image 30 of series 2. Lungs/Pleura: Moderate changes of centrilobular emphysema. Right perihilar lung mass measures 2.9 cm, image 37 of series 2. Previously 1.8 cm. New left perihilar lung mass measures 2.9 by 3.4 cm, image 91 of  series 6. New left lower lobe lung nodule measures 0.9 x 0.7 cm, image 117 of series 6. Musculoskeletal: Within the left breast there is a peripherally enhancing lesion measuring 1.4 cm, image 37 of series 2. New from previous exam. Also new is a left superior breast lesion measuring 1.3 cm, image 17 of series 2. CT ABDOMEN PELVIS FINDINGS Hepatobiliary: Scattered low-attenuation lesions are identified within the liver. New from previous exam. Within the posterior right lobe index lesion measures 0.8 cm, image 60 of series 2. Also in the right lobe of liver is a 0.7 cm lesion, image 62 of series 2. Gallbladder normal. No biliary dilatation. Pancreas: Pancreas normal. Spleen: No splenic metastases. Adrenals/Urinary Tract: Right adrenal mass measures 4.5 x 2.0 cm, image 56 of series 2. Previously 4.8 x 2.5 cm. Left adrenal lesion measures 2.1 x 1.6 cm, image 55 of series 2. Previously 2.9 x 1.9 cm. Bilateral kidney cysts. No hydronephrosis. Urinary bladder appears normal. Stomach/Bowel: The stomach is unremarkable. No pathologic dilatation of the large or small bowel loops. Vascular/Lymphatic: Aortic atherosclerosis. No aneurysm. New retroperitoneal adenopathy. Index aortocaval node at the level of the left renal  vein measures 1.7 cm, image 64 of series 2. New from previous exam. Also new is a left periaortic node measuring 1.9 cm, image 64 of series 2. Reproductive: Uterus and bilateral adnexa are unremarkable. Other: Enhancing soft tissue nodule along the left pelvic sidewall is new from previous exam measuring 1.8 cm, image 99 of series 2. Musculoskeletal: Multifocal abdominal wall metastatic implants identified. The largest is at the level of the umbilicus on the left measuring 3 cm, image 80 of series 2. Lytic bone lesion within the right iliac bone measures 1.6 cm, image 88 of series 2. New from previous exam. Mixed lytic and sclerotic bone metastasis within the left iliac bone measures 2.8 cm, image 93 of series 2. Unchanged from previous exam. IMPRESSION: 1. Overall interval progression of disease. 2. New left supraclavicular, mediastinal, abdominal and pelvic nodal metastases. 3. New bilateral pulmonary metastasis 4. Interval development of multifocal body wall subcutaneous metastasis. 5. New lytic lesion identified within the right iliac bone. 6. New liver metastasis 7. Mild regression of bilateral adrenal gland metastasis. Electronically Signed   By: Kerby Moors M.D.   On: 02/23/2017 09:19     ASSESSMENT/PLAN:  Non-small cell carcinoma of right lung, stage 4 ([facility]) This is a very pleasant 46 year old African-American female with metastatic non-small cell lung cancer, adenocarcinoma with bone and brain metastasis. The patient underwent several treatments in the past including induction systemic chemotherapy with carboplatin and Alimta discontinued secondary to intolerance followed by maintenance treatment with single agent Alimta discontinued secondary to disease progression followed by treatment with immunotherapy with Tecentriq (Atezolizumab) for 6 cycles discontinued secondary to disease progression. The patient was then started on treatment with docetaxel and Cyramza discontinued secondary to disease  progression. She also had palliative radiation to the brain for metastatic disease to the brain. She was treated with systemic chemotherapy with single agent gemcitabine status post 5 cycles discontinued secondary to disease progression. She had repeat CT scan of the chest, abdomen and pelvis performedin August 2018 which showed evidence for disease progression with enlargement of the bilateral adrenal lesions.The patient underwent radiation to the adrenal lesions.  CBC was reviewed and is adequate for treatment.  The patient wishes to continue her chemotherapy.  She will proceed with cycle 2 today as scheduled.  We will check a urinalysis due to the  change in color of her urine. Continue fentanyl patch and Percocet for pain control.   Continue Zofran as needed for nausea.  She may use Imodium as needed for diarrhea.  For the weight loss, she will continue on Marinol.  She will also continue on Remeron.   The patient will be seen back for follow-up for evaluation prior to cycle 3 of her chemotherapy.  She was advised to call if she has any other concerning symptoms in the interval. The patient voices understanding of current disease status and treatment options and is in agreement with the current care plan. All questions were answered. The patient knows to call the clinic with any problems, questions or concerns. We can certainly see the patient much sooner if necessary.   Bone metastases The patient has known bone metastases.  She has not received Xgeva in several months.  Calcium level is elevated today.  Delton See was ordered to be given today.  Hypokalemia Patient's potassium level today is 2.7.  She was given calcium 40 mEq in our office today.  A prescription for potassium was sent to her local pharmacy.  She was instructed to take this for the next 7 days.  We will recheck her potassium level every 2 weeks with each cycle of chemotherapy.  No orders of the defined types were  placed in this encounter.  Mikey Bussing, DNP, AGPCNP-BC, AOCNP 03/14/17

## 2017-03-14 NOTE — Assessment & Plan Note (Addendum)
Patient's potassium level today is 2.7.  She was given calcium 40 mEq in our office today.  A prescription for potassium was sent to her local pharmacy.  She was instructed to take this for the next 7 days.  We will recheck her potassium level every 2 weeks with each cycle of chemotherapy.

## 2017-03-22 ENCOUNTER — Telehealth: Payer: Self-pay | Admitting: Medical Oncology

## 2017-03-22 NOTE — Telephone Encounter (Signed)
Next appt left on pt VM

## 2017-03-27 ENCOUNTER — Telehealth: Payer: Self-pay | Admitting: *Deleted

## 2017-03-27 DIAGNOSIS — C3491 Malignant neoplasm of unspecified part of right bronchus or lung: Secondary | ICD-10-CM

## 2017-03-27 DIAGNOSIS — C7972 Secondary malignant neoplasm of left adrenal gland: Secondary | ICD-10-CM

## 2017-03-27 DIAGNOSIS — C7931 Secondary malignant neoplasm of brain: Secondary | ICD-10-CM

## 2017-03-27 DIAGNOSIS — C801 Malignant (primary) neoplasm, unspecified: Secondary | ICD-10-CM

## 2017-03-27 DIAGNOSIS — C7951 Secondary malignant neoplasm of bone: Secondary | ICD-10-CM

## 2017-03-27 NOTE — Telephone Encounter (Signed)
Pt called, lmovm  states "I will not be coming to my appt on Thursday, I've decided to do palliative care." Returned call to pt to clarify, lmovm for pt to call back if she would like Hospice or palliative. No appts will be cancelled until pt clarifies.

## 2017-03-27 NOTE — Telephone Encounter (Signed)
Pt returned call states " I want to have hospice as long as I can do it at my house." Referral entered per pt request. Hospice notified. Message to scheduling to cancel all appts per pt request. MD notified

## 2017-03-29 ENCOUNTER — Other Ambulatory Visit: Payer: Self-pay | Admitting: Medical Oncology

## 2017-03-29 ENCOUNTER — Ambulatory Visit: Payer: Medicaid Other

## 2017-03-29 ENCOUNTER — Ambulatory Visit: Payer: Medicaid Other | Admitting: Oncology

## 2017-03-29 ENCOUNTER — Other Ambulatory Visit: Payer: Medicaid Other

## 2017-03-29 DIAGNOSIS — C7972 Secondary malignant neoplasm of left adrenal gland: Secondary | ICD-10-CM

## 2017-03-29 DIAGNOSIS — C7951 Secondary malignant neoplasm of bone: Secondary | ICD-10-CM

## 2017-03-29 DIAGNOSIS — C3490 Malignant neoplasm of unspecified part of unspecified bronchus or lung: Secondary | ICD-10-CM

## 2017-03-29 DIAGNOSIS — C801 Malignant (primary) neoplasm, unspecified: Secondary | ICD-10-CM

## 2017-03-29 MED ORDER — FENTANYL 100 MCG/HR TD PT72: 100.0000 ug | MEDICATED_PATCH | TRANSDERMAL | 0 refills | Status: AC

## 2017-03-29 MED ORDER — OXYCODONE-ACETAMINOPHEN 10-325 MG PO TABS: 1.0000 | ORAL_TABLET | Freq: Four times a day (QID) | ORAL | 0 refills | Status: AC | PRN

## 2017-03-29 MED FILL — OXYCODONE-ACETAMINOPHEN 10-: 10-325 | 15 days supply | Qty: 60 | Fill #0

## 2017-03-29 NOTE — Progress Notes (Signed)
I left message that pt rx are ready for pick up.

## 2017-04-04 MED FILL — PROCHLORPERAZINE 10 MG TAB: 10 | 5 days supply | Qty: 30 | Fill #0

## 2017-04-12 ENCOUNTER — Ambulatory Visit: Payer: Medicaid Other

## 2017-04-12 ENCOUNTER — Other Ambulatory Visit: Payer: Medicaid Other

## 2017-04-12 ENCOUNTER — Ambulatory Visit: Payer: Medicaid Other | Admitting: Oncology

## 2017-04-16 MED FILL — MORPHINE SULF 60 MG TAB SA: 60 | 15 days supply | Qty: 30 | Fill #0

## 2017-04-23 ENCOUNTER — Telehealth: Payer: Self-pay | Admitting: Medical Oncology

## 2017-04-23 NOTE — Telephone Encounter (Signed)
Pt requests to keep same aide she has now and get longer hours. I cleft message for pt that I am working on this and contacted hospice and asked Threasa Beards, Social worker to work with pt on this request.

## 2017-05-12 ENCOUNTER — Other Ambulatory Visit: Payer: Self-pay | Admitting: Nurse Practitioner

## 2017-05-25 DEATH — deceased

## 2018-07-23 IMAGING — CT CT ABD-PELV W/ CM
2 of 5 series · 16 of 46 positions shown, 18 images · IV contrast (ISOVUE)
Comparison: PET CTs 09/30/2015. Abdomen and pelvis CT from
09/17/2015.

CLINICAL DATA: History of right lung cancer status post XRT and
chemo.

EXAM:
CT ABDOMEN AND PELVIS WITH CONTRAST
TECHNIQUE: Multidetector CT imaging of the abdomen and pelvis was performed
using the standard protocol following bolus administration of
intravenous contrast.
CONTRAST:  100mL [id number]-KCC IOPAMIDOL (Z8WIV8-KCC) INJECTION 61%

[Series 2: rtn a/p with · axial · 0.73mm/px · z∈[-481,-81]mm · 13 of 92 slices shown, 15 images]
[im 6/92  soft-tissue]
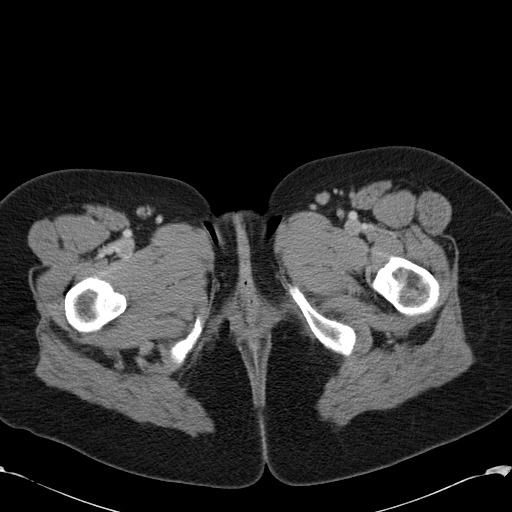
[im 6/92  bone]
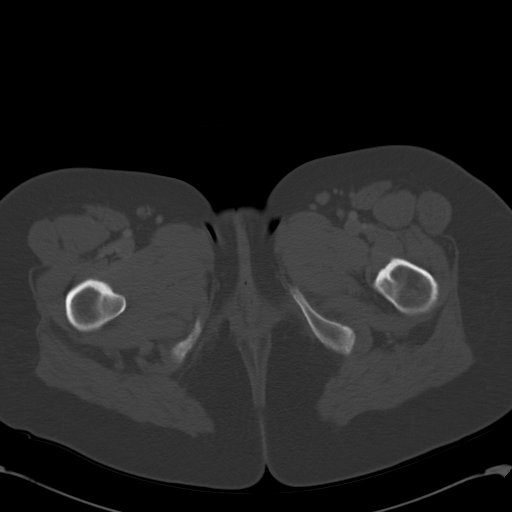
[im 12/92  soft-tissue]
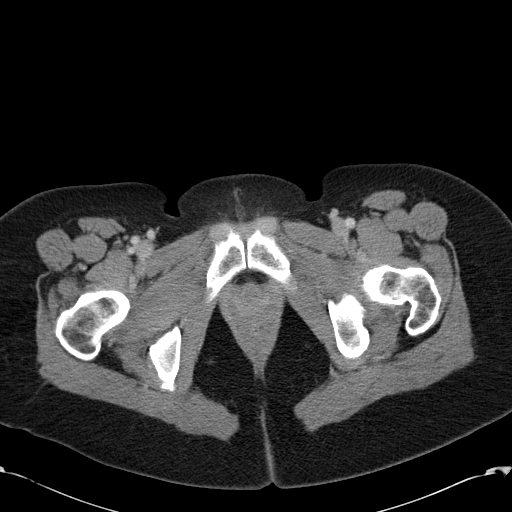
[im 18/92  soft-tissue]
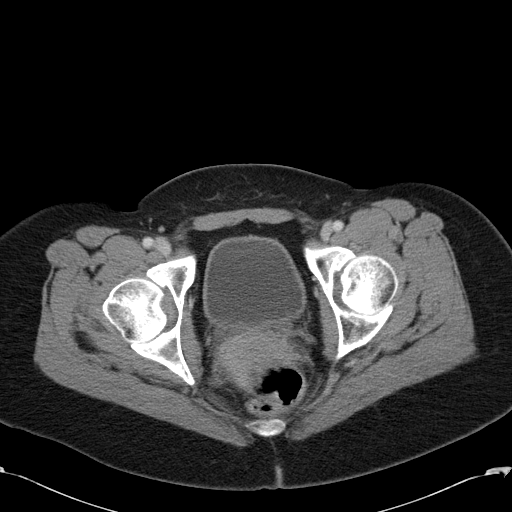
[im 29/92  soft-tissue]
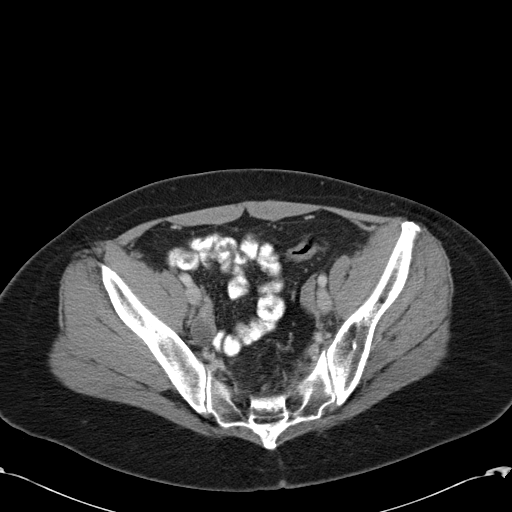
[im 35/92  soft-tissue]
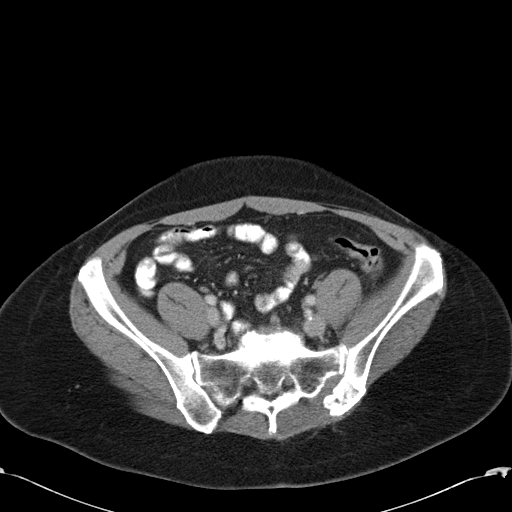
[im 40/92  soft-tissue]
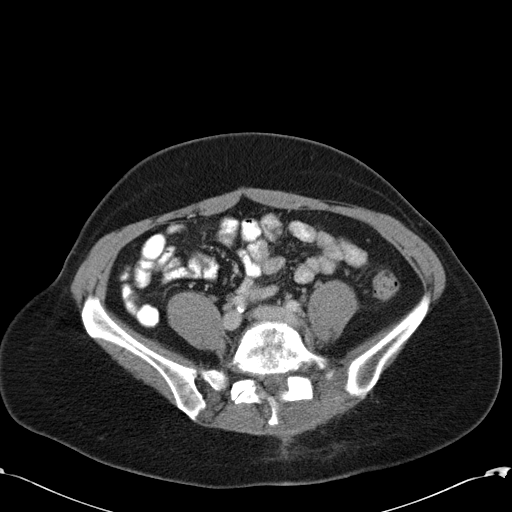
[im 46/92  soft-tissue]
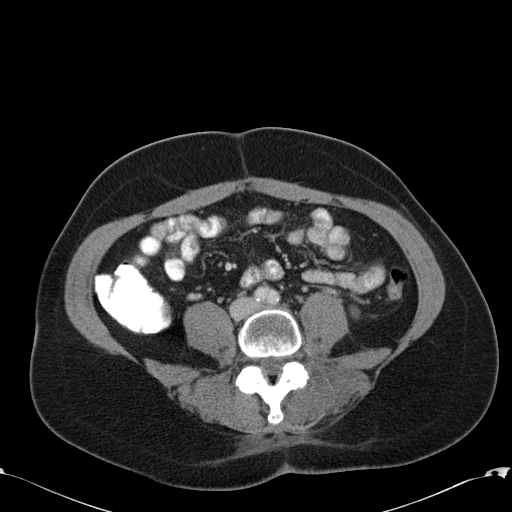
[im 52/92  soft-tissue]
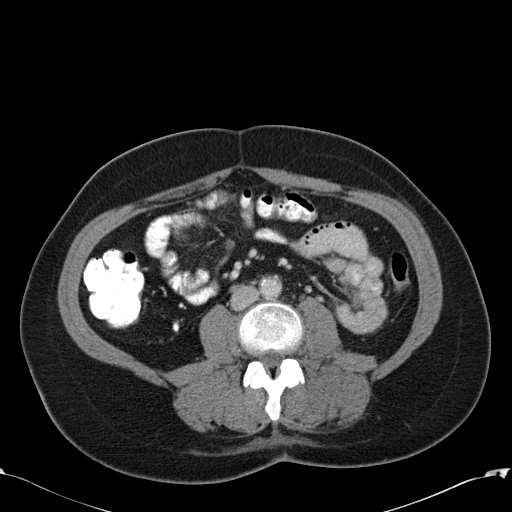
[im 57/92  soft-tissue]
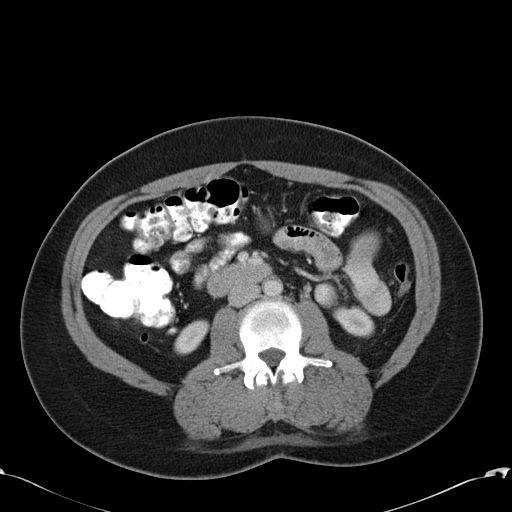
[im 57/92  bone]
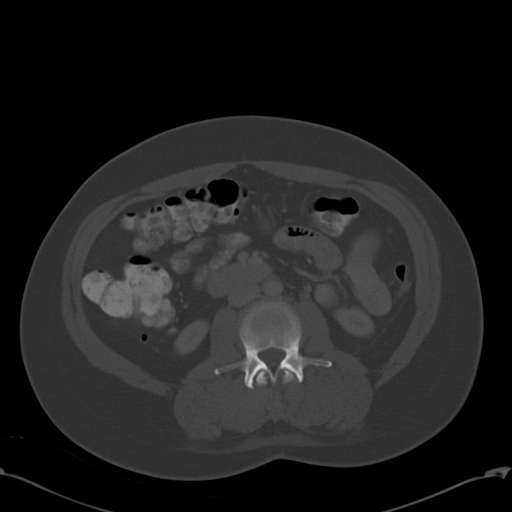
[im 63/92  soft-tissue]
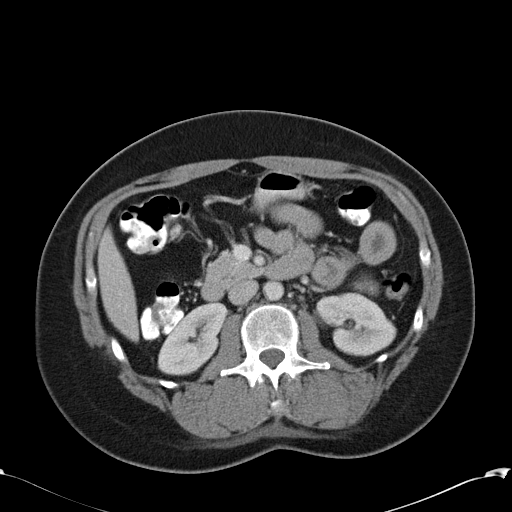
[im 74/92  soft-tissue]
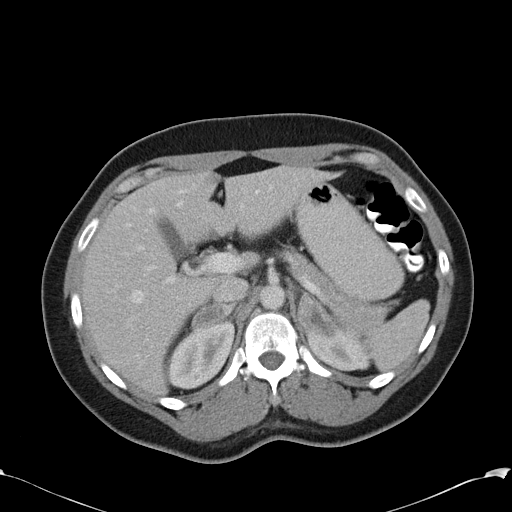
[im 80/92  soft-tissue]
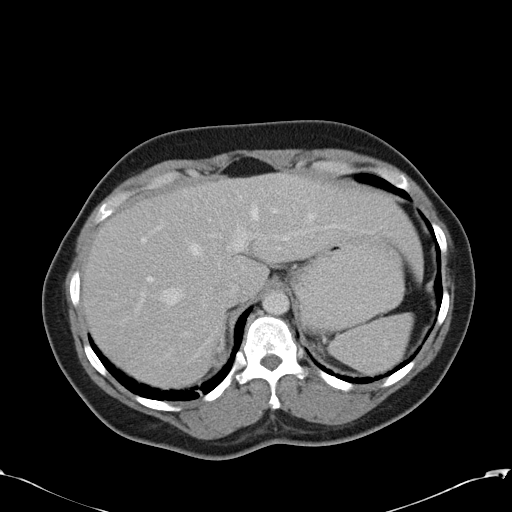
[im 86/92  soft-tissue]
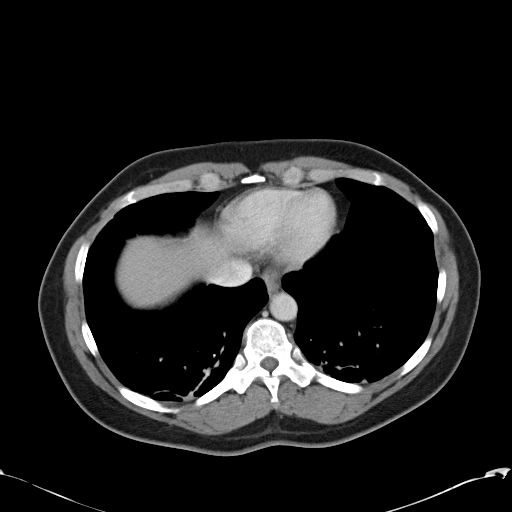

[Series 602: <mpr thick range> · coronal · 0.90mm/px · 3 of 128 slices shown]
[im 43/128  soft-tissue]
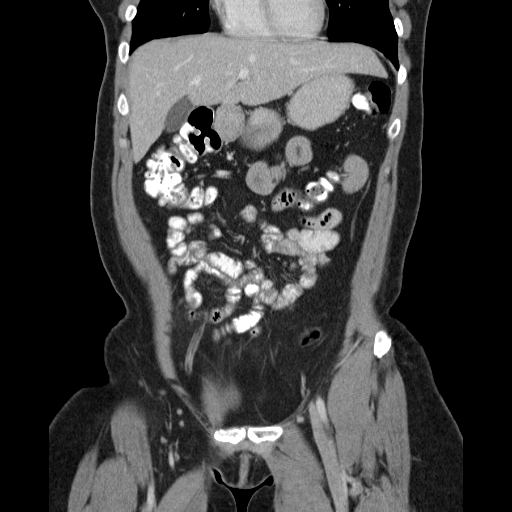
[im 57/128  soft-tissue]
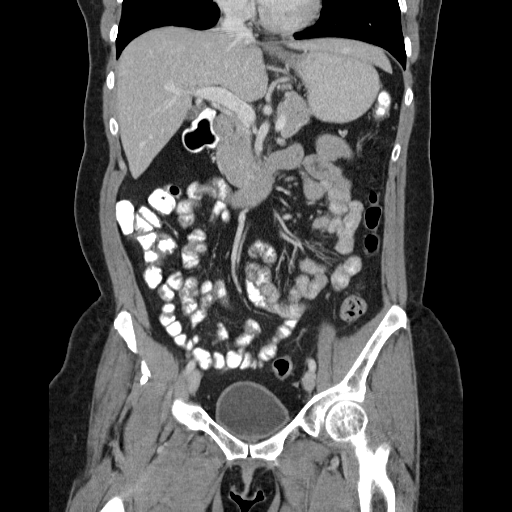
[im 71/128  soft-tissue]
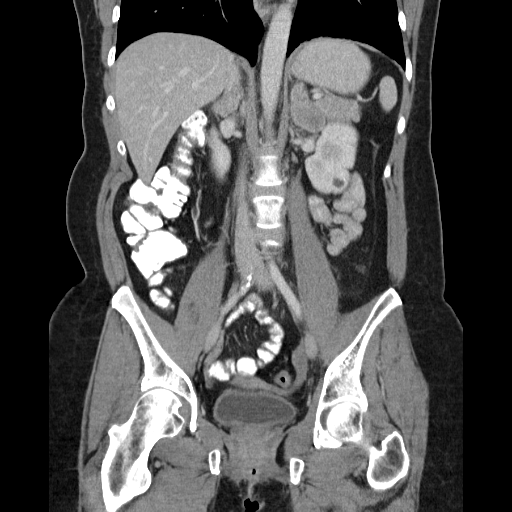

[16 of 46 positions shown; findings below may reference images not displayed]

FINDINGS: Lower chest:  Subsegmental atelectasis noted in the lung bases.

Hepatobiliary: Tiny hypo attenuating lesion in the dome of the liver
is stable, likely reflecting a tiny cyst. Liver parenchyma otherwise
unremarkable. There is no evidence for gallstones, gallbladder wall
thickening, or pericholecystic fluid. No intrahepatic or
extrahepatic biliary dilation.

Pancreas: No focal mass lesion. No dilatation of the main duct. No
intraparenchymal cyst. No peripancreatic edema.

Spleen: No splenomegaly. No focal mass lesion.

Adrenals/Urinary Tract: Known right adrenal lesion measures 3.0 x
1.5 cm. There has been marked progression of the necrotic left
adrenal lesion since previous abdomen and pelvis CT measuring 2.1 x
4.2 cm today which compares to 2.0 x 1.0 on 09/17/2015.

Small well-defined low-density lesions in the kidneys are relatively
stable likely represent cysts. No evidence for hydroureter. The
urinary bladder appears normal for the degree of distention.

Stomach/Bowel: Stomach is nondistended. No gastric wall thickening.
No evidence of outlet obstruction. Duodenum is normally positioned
as is the ligament of Treitz. No small bowel wall thickening. No
small bowel dilatation. The terminal ileum is normal. The appendix
is normal. No gross colonic mass. No colonic wall thickening. No
substantial diverticular change.

Vascular/Lymphatic: There is abdominal aortic atherosclerosis
without aneurysm. There is no gastrohepatic or hepatoduodenal
ligament lymphadenopathy. No intraperitoneal or retroperitoneal
lymphadenopathy. No pelvic sidewall lymphadenopathy.

Other: The uterus has normal CT imaging appearance. There is no
adnexal mass. No intraperitoneal free fluid.

Musculoskeletal: Mixed lytic and sclerotic lesion posterior left
iliac bone is unchanged in the interval.
IMPRESSION: 1. Bilateral adrenal metastases, progressed since prior abdominal
and pelvis CT but not substantially changed since more recent chest
CT.
2. Stable appearance mixed lytic and sclerotic lesion posterior left
iliac bone.
3. No other evidence for metastatic disease in the abdomen or
pelvis.
4. Abdominal aortic atherosclerosis.
